# Patient Record
Sex: Male | Born: 1940 | Race: White | Hispanic: No | Marital: Married | Smoking: Former smoker
Health system: Southern US, Community
[De-identification: ages and names within clinical notes are randomized; demographics above are authoritative.]

## PROBLEM LIST (undated history)

## (undated) DIAGNOSIS — N429 Disorder of prostate, unspecified: Secondary | ICD-10-CM

## (undated) DIAGNOSIS — D696 Thrombocytopenia, unspecified: Secondary | ICD-10-CM

## (undated) DIAGNOSIS — T8859XA Other complications of anesthesia, initial encounter: Secondary | ICD-10-CM

## (undated) DIAGNOSIS — J849 Interstitial pulmonary disease, unspecified: Secondary | ICD-10-CM

## (undated) DIAGNOSIS — J189 Pneumonia, unspecified organism: Secondary | ICD-10-CM

## (undated) DIAGNOSIS — I714 Abdominal aortic aneurysm, without rupture, unspecified: Secondary | ICD-10-CM

## (undated) DIAGNOSIS — K219 Gastro-esophageal reflux disease without esophagitis: Secondary | ICD-10-CM

## (undated) DIAGNOSIS — E039 Hypothyroidism, unspecified: Secondary | ICD-10-CM

## (undated) DIAGNOSIS — I499 Cardiac arrhythmia, unspecified: Secondary | ICD-10-CM

## (undated) DIAGNOSIS — D649 Anemia, unspecified: Secondary | ICD-10-CM

## (undated) DIAGNOSIS — E079 Disorder of thyroid, unspecified: Secondary | ICD-10-CM

## (undated) DIAGNOSIS — C4490 Unspecified malignant neoplasm of skin, unspecified: Secondary | ICD-10-CM

## (undated) DIAGNOSIS — C61 Malignant neoplasm of prostate: Secondary | ICD-10-CM

## (undated) DIAGNOSIS — I6529 Occlusion and stenosis of unspecified carotid artery: Secondary | ICD-10-CM

## (undated) DIAGNOSIS — R972 Elevated prostate specific antigen [PSA]: Secondary | ICD-10-CM

## (undated) DIAGNOSIS — C969 Malignant neoplasm of lymphoid, hematopoietic and related tissue, unspecified: Secondary | ICD-10-CM

## (undated) DIAGNOSIS — G709 Myoneural disorder, unspecified: Secondary | ICD-10-CM

## (undated) DIAGNOSIS — E785 Hyperlipidemia, unspecified: Secondary | ICD-10-CM

## (undated) DIAGNOSIS — Z9481 Bone marrow transplant status: Secondary | ICD-10-CM

## (undated) DIAGNOSIS — M1990 Unspecified osteoarthritis, unspecified site: Secondary | ICD-10-CM

## (undated) DIAGNOSIS — D469 Myelodysplastic syndrome, unspecified: Secondary | ICD-10-CM

## (undated) DIAGNOSIS — I10 Essential (primary) hypertension: Secondary | ICD-10-CM

## (undated) DIAGNOSIS — K449 Diaphragmatic hernia without obstruction or gangrene: Secondary | ICD-10-CM

## (undated) DIAGNOSIS — T884XXA Failed or difficult intubation, initial encounter: Secondary | ICD-10-CM

## (undated) DIAGNOSIS — M1A9XX0 Chronic gout, unspecified, without tophus (tophi): Secondary | ICD-10-CM

## (undated) HISTORY — DX: Failed or difficult intubation, initial encounter: T88.4XXA

## (undated) HISTORY — DX: Thrombocytopenia, unspecified: D69.6

## (undated) HISTORY — DX: Hyperlipidemia, unspecified: E78.5

## (undated) HISTORY — DX: Gastro-esophageal reflux disease without esophagitis: K21.9

## (undated) HISTORY — PX: EGD: SHX3789

## (undated) HISTORY — DX: Other complications of anesthesia, initial encounter: T88.59XA

## (undated) HISTORY — DX: Diaphragmatic hernia without obstruction or gangrene: K44.9

## (undated) HISTORY — PX: CATARACT EXTRACTION: SUR2

## (undated) HISTORY — DX: Unspecified osteoarthritis, unspecified site: M19.90

## (undated) HISTORY — DX: Chronic gout, unspecified, without tophus (tophi): M1A.9XX0

## (undated) HISTORY — DX: Essential (primary) hypertension: I10

## (undated) HISTORY — DX: Myelodysplastic syndrome, unspecified: D46.9

## (undated) HISTORY — DX: Hypothyroidism, unspecified: E03.9

## (undated) HISTORY — DX: Anemia, unspecified: D64.9

## (undated) HISTORY — DX: Disorder of prostate, unspecified: N42.9

## (undated) HISTORY — PX: JOINT REPLACEMENT: SHX530

## (undated) HISTORY — PX: EYE SURGERY: SHX253

## (undated) HISTORY — PX: HERNIA REPAIR: SHX51

## (undated) HISTORY — PX: KNEE SURGERY: SHX244

## (undated) HISTORY — PX: CARDIAC CATHETERIZATION: SHX172

## (undated) HISTORY — PX: MOHS SURGERY: SUR867

## (undated) HISTORY — DX: Occlusion and stenosis of unspecified carotid artery: I65.29

## (undated) HISTORY — DX: Disorder of thyroid, unspecified: E07.9

## (undated) HISTORY — DX: Abdominal aortic aneurysm, without rupture, unspecified: I71.40

## (undated) HISTORY — PX: ABDOMINAL AORTIC ANEURYSM REPAIR: SUR1152

## (undated) HISTORY — PX: BONE MARROW TRANSPLANT: SHX200

---
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
## 1971-04-01 HISTORY — PX: FRACTURE SURGERY: SHX138

## 1986-03-31 HISTORY — PX: OTHER SURGICAL HISTORY: SHX169

## 1993-06-02 ENCOUNTER — Emergency Department: Admit: 1993-06-02 | Disposition: A | Discharge status: Home / Self Care | Payer: Self-pay | Source: Ambulatory Visit

## 2002-03-31 DIAGNOSIS — C329 Malignant neoplasm of larynx, unspecified: Secondary | ICD-10-CM

## 2002-03-31 DIAGNOSIS — C801 Malignant (primary) neoplasm, unspecified: Secondary | ICD-10-CM

## 2002-03-31 HISTORY — DX: Malignant (primary) neoplasm, unspecified: C80.1

## 2002-03-31 HISTORY — DX: Malignant neoplasm of larynx, unspecified: C32.9

## 2002-05-31 ENCOUNTER — Observation Stay
Admission: RE | Admit: 2002-05-31 | Disposition: A | Discharge status: Home / Self Care | Payer: Self-pay | Source: Ambulatory Visit | Admitting: Otolaryngology

## 2002-08-05 ENCOUNTER — Ambulatory Visit
Admit: 2002-08-05 | Disposition: A | Discharge status: Home / Self Care | Payer: Self-pay | Source: Ambulatory Visit | Admitting: Otolaryngology

## 2002-08-09 ENCOUNTER — Ambulatory Visit: Admission: RE | Admit: 2002-08-09 | Payer: Self-pay | Source: Ambulatory Visit | Admitting: Otolaryngology

## 2009-03-07 ENCOUNTER — Ambulatory Visit
Admission: AD | Admit: 2009-03-07 | Disposition: A | Discharge status: Home / Self Care | Payer: Self-pay | Source: Ambulatory Visit | Admitting: Gastroenterology

## 2009-03-31 HISTORY — PX: JOINT REPLACEMENT: SHX530

## 2009-06-22 ENCOUNTER — Ambulatory Visit
Admission: RE | Admit: 2009-06-22 | Disposition: A | Discharge status: Home / Self Care | Payer: Self-pay | Source: Ambulatory Visit | Admitting: Gastroenterology

## 2009-08-09 ENCOUNTER — Observation Stay
Admission: EM | Admit: 2009-08-09 | Disposition: A | Discharge status: Home / Self Care | Payer: Self-pay | Source: Emergency Department | Admitting: Internal Medicine

## 2009-08-11 LAB — CBC AND DIFFERENTIAL
BASOPHILS %: 0.3 % (ref 0.0–2.0)
Baso(Absolute): 0.02 10*3/uL (ref 0.00–0.20)
Eosinophils %: 1.7 % (ref 0.0–6.0)
Eosinophils Absolute: 0.1 10*3/uL (ref 0.10–0.30)
Hematocrit: 39.2 % (ref 39.0–49.0)
Hemoglobin: 12.9 g/dL — ABNORMAL LOW (ref 13.2–17.3)
Immature Granulocytes #: 0.02 10*3/uL (ref 0.00–0.05)
Immature Granulocytes %: 0.3 % — ABNORMAL HIGH (ref 0.0–0.0)
Lymphocytes Absolute: 1 10*3/uL (ref 1.00–4.80)
Lymphocytes Relative: 16.9 % — ABNORMAL LOW (ref 25.0–55.0)
MCH: 28.9 pg (ref 27.0–34.0)
MCHC: 32.9 g/dL (ref 32.0–36.0)
MCV: 87.7 fL (ref 80–100)
MPV: 9.4 fL (ref 9.0–13.0)
Monocytes Absolute: 0.42 10*3/uL (ref 0.10–1.20)
Monocytes Relative %: 7.1 % (ref 1.0–8.0)
Neutrophils Absolute: 4.37 10*3/uL (ref 1.80–7.70)
Neutrophils Relative %: 74 % — ABNORMAL HIGH (ref 49.0–69.0)
Nucleated RBC %: 0 /100WBC (ref 0.0–0.0)
Nucleted RBC #: 0 10*3/uL (ref 0.00–0.00)
Platelets: 215 10*3/uL (ref 150–400)
RBC: 4.47 M/uL (ref 3.80–5.40)
RDW: 14.4 % — ABNORMAL HIGH (ref 11.0–14.0)
WBC: 5.91 10*3/uL (ref 4.80–10.80)

## 2009-08-11 LAB — MAGNESIUM: Magnesium: 2.1 mg/dL (ref 1.7–2.2)

## 2009-08-11 LAB — CK
Creatine Kinase (CK): 75 U/L (ref 19–216)
Creatine Kinase (CK): 67 U/L (ref 19–216)

## 2009-08-11 LAB — COMPREHENSIVE METABOLIC PANEL
ALT: 37 U/L (ref 7–56)
AST (SGOT): 22 U/L (ref 5–40)
Albumin, Synovial: 4.5 g/dL (ref 3.9–5.0)
Alkaline Phosphatase: 102 U/L (ref 38–126)
BUN / Creatinine Ratio: 25 — ABNORMAL HIGH (ref 8–20)
BUN: 20 mg/dL (ref 6–20)
Bilirubin, Total: 0.4 mg/dL (ref 0.2–1.3)
CO2: 25 mmol/L (ref 21.0–31.0)
Calcium: 9 mg/dL (ref 8.4–10.2)
Chloride: 104 mmol/L (ref 101–111)
Creatinine: 0.83 mg/dL (ref 0.66–1.25)
EGFR: >60 mL/min/{1.73_m2}
EGFR: >60 mL/min/{1.73_m2}
Glucose: 100 mg/dL (ref 70–100)
Potassium: 4.2 mmol/L (ref 3.6–5.0)
Protein, Total: 7.2 g/dL (ref 6.3–8.2)
Sodium: 143 mmol/L (ref 135–145)

## 2009-08-11 LAB — URINALYSIS
Bilirubin, UA: NEGATIVE
Blood, UA: NEGATIVE
Glucose, UA: NEGATIVE
Ketones UA: NEGATIVE
Leukocyte Esterase, UA: NEGATIVE
Nitrate: NEGATIVE
Protein, UR: NEGATIVE
Specific Gravity, UR: 1.022 (ref 1.000–1.035)
Urobilinogen, UA: NORMAL
pH, Urine: 6 (ref 5.0–8.0)

## 2009-08-11 LAB — TROPONIN I
Troponin I: <0.012 ng/mL (ref 0.000–0.034)
Troponin I: <0.012 ng/mL (ref 0.000–0.034)

## 2009-08-11 LAB — PTT (PARTIAL THROMBOPLASTIN TIME)
PTT Ratio: 1.1 (ref 0.8–1.2)
PTT: 31.3 s (ref 21.6–34.0)

## 2009-08-11 LAB — PT (PROTHROMBIN TIME)
PT INR: 1.3
PT: 14 s — ABNORMAL HIGH (ref 10.6–12.8)

## 2009-08-11 LAB — D-DIMER, QUANTITATIVE: D-Dimer, Quant.: 1017 ng/mL

## 2009-08-11 LAB — MYOGLOBIN, SERUM: Myoglobin: 26 ng/mL (ref 0–121)

## 2009-08-11 LAB — B-TYPE NATRIURETIC PEPTIDE: B-Natriuretic Peptide: 34 pg/mL (ref 0–100)

## 2010-10-15 ENCOUNTER — Emergency Department
Admission: EM | Admit: 2010-10-15 | Disposition: A | Discharge status: Home / Self Care | Payer: Self-pay | Source: Emergency Department | Admitting: Emergency Medical Services

## 2010-10-15 LAB — CBC AND DIFFERENTIAL
BASOPHILS %: 0.2 % (ref 0.0–2.0)
Baso(Absolute): 0.02 10*3/uL (ref 0.00–0.20)
Eosinophils %: 0.2 % (ref 0.0–6.0)
Eosinophils Absolute: 0.02 10*3/uL — ABNORMAL LOW (ref 0.10–0.30)
Hematocrit: 39.9 % (ref 39.0–49.0)
Hemoglobin: 13.8 g/dL (ref 13.2–17.3)
Immature Granulocytes #: 0.02 10*3/uL (ref 0.00–0.05)
Immature Granulocytes %: 0.2 % — ABNORMAL HIGH (ref 0.0–0.0)
Lymphocytes Absolute: 0.4 10*3/uL — ABNORMAL LOW (ref 1.00–4.80)
Lymphocytes Relative: 3.9 % — ABNORMAL LOW (ref 25.0–55.0)
MCH: 31.2 pg (ref 27.0–34.0)
MCHC: 34.6 g/dL (ref 32.0–36.0)
MCV: 90.3 fL (ref 80–100)
MPV: 9.6 fL (ref 9.0–13.0)
Monocytes Absolute: 0.24 10*3/uL (ref 0.10–1.20)
Monocytes Relative %: 2.4 % (ref 1.0–8.0)
Neutrophils Absolute: 9.48 10*3/uL — ABNORMAL HIGH (ref 1.80–7.70)
Neutrophils Relative %: 93.3 % — ABNORMAL HIGH (ref 49.0–69.0)
Nucleated RBC %: 0 /100WBC (ref 0.0–0.0)
Nucleted RBC #: 0 10*3/uL (ref 0.00–0.00)
Platelets: 180 10*3/uL (ref 150–400)
RBC: 4.42 M/uL (ref 3.80–5.40)
RDW: 14 % (ref 11.0–14.0)
WBC: 10.16 10*3/uL (ref 4.80–10.80)

## 2010-10-15 LAB — COMPREHENSIVE METABOLIC PANEL
ALT: 33 U/L (ref 7–56)
AST (SGOT): 34 U/L (ref 5–40)
Albumin, Synovial: 4.7 g/dL (ref 3.9–5.0)
Alkaline Phosphatase: 83 U/L (ref 38–126)
BUN / Creatinine Ratio: 16 (ref 8–20)
BUN: 18 mg/dL (ref 6–20)
Bilirubin, Total: 1.5 mg/dL — ABNORMAL HIGH (ref 0.2–1.3)
CO2: 25 mmol/L (ref 21.0–31.0)
Calcium: 9.2 mg/dL (ref 8.4–10.2)
Chloride: 100 mmol/L — ABNORMAL LOW (ref 101–111)
Creatinine: 1.14 mg/dL (ref 0.66–1.25)
EGFR: >60 mL/min/{1.73_m2}
EGFR: >60 mL/min/{1.73_m2}
Glucose: 92 mg/dL (ref 70–100)
Potassium: 4.1 mmol/L (ref 3.6–5.0)
Protein, Total: 7.9 g/dL (ref 6.3–8.2)
Sodium: 139 mmol/L (ref 135–145)

## 2010-10-15 LAB — LACTIC ACID, PLASMA: Lactic acid: 1.8 mmol/L (ref 0.7–2.1)

## 2010-10-15 LAB — URINALYSIS
Bilirubin, UA: NEGATIVE
Blood, UA: NEGATIVE
Glucose, UA: NEGATIVE
Ketones UA: NEGATIVE
Leukocyte Esterase, UA: NEGATIVE
Nitrate: NEGATIVE
Protein, UR: NEGATIVE
Specific Gravity, UR: 1.015 (ref 1.000–1.035)
Urobilinogen, UA: NORMAL
pH, Urine: 6 (ref 5.0–8.0)

## 2010-10-15 LAB — PTT (PARTIAL THROMBOPLASTIN TIME): PTT: 33.1 s (ref 23.0–37.0)

## 2010-10-15 LAB — CK: Creatine Kinase (CK): 100 U/L (ref 19–216)

## 2010-10-15 LAB — INFLUENZA ANTIGEN
Influenza A: NEGATIVE
Influenza B: NEGATIVE

## 2010-10-15 LAB — MONONUCLEOSIS SCREEN: Mono Screen: NEGATIVE

## 2010-10-15 LAB — SEDIMENTATION RATE, AUTOMATED: Sed Rate: 32 — ABNORMAL HIGH (ref 0–20)

## 2010-10-15 LAB — C-REACTIVE PROTEIN, QUANTITATIVE: C-Reactive Protein, Quantitative: 1.3 mg/dL — ABNORMAL HIGH (ref 0.0–1.0)

## 2010-10-15 LAB — PT (PROTHROMBIN TIME)
PT INR: 1
PT: 13.6 s (ref 12.6–15.0)

## 2010-10-15 LAB — STREP A ANTIGEN (THROAT): Streptococcus A AG: NEGATIVE

## 2011-01-01 NOTE — Consults (Signed)
Brent [name], Brent [name]                                                    MRN:          [id number]                                                          Account:      [id number]                                                     Document ID:  [id number]                                               Service Date: 08/09/2009                                                                                    MRN: [id number]  Document ID: [id number]  Admit Date: 08/09/2009     Patient Location: [id number]   Patient Type: O     CONSULTING PHYSICIAN: Chevis Pretty MD     REFERRING PHYSICIAN:         HISTORY OF PRESENT ILLNESS:  This is a 70 year old gentleman with past medical history of  hyperlipidemia, borderline hypertension, throat cancer in the past who now  comes in with complaints of dizziness.  According to the patient, he played  golf yesterday had a few drinks last night and when he woke up this  morning, he had a sense of dizziness.  This dizziness is described as a  spinning sensation as if the room was spinning.  This sensation came every  time he bent down.  It seems to be positional.  The patient had never  experienced this kind of dizziness in the past.  He came to the emergency  room.  An emergent CT scan of the head was done, which was unremarkable.   The patient had another episode in the emergency room where he again felt  dizzy.  The patient denies any weakness anywhere on the body.  He denies  any speech or swallowing problems.  He denies any imbalance.  His primary  care physician has been monitoring him for blood pressure management.  His  blood pressures have been borderline and running slightly high at times.     PAST MEDICAL HISTORY:  As mentioned above is significant for hyperlipidemia, borderline  hypertension, history of throat cancer in the past.     SOCIAL HISTORY:  The patient quit smoking several  years ago.  He takes alcohol occasionally.     FAMILY HISTORY:  Noncontributory.      ALLERGIES:  No known drug allergies.     CURRENT MEDICATIONS:  Celebrex, Lipitor, Protonix.     REVIEW OF SYSTEMS:                                                                                                           Page 1 of 2  Brent [name]                                                    MRN:          [id number]                                                          Account:      [id number]                                                     Document ID:  [id number]                                               Service Date: 08/09/2009                                                                                    I reviewed history and physical.     PHYSICAL EXAMINATION:  VITAL SIGNS:  Noted in nurse's notes.  HEENT:  Atraumatic, normocephalic.  Eyes:  Extraocular muscles are intact.   Tongue midline.  NECK:  Supple.  NEUROLOGIC:  Higher motor functions:  Awake, alert, oriented, answers  questions appropriately.  Cranial nerves grossly intact.  Motor:  Good  functional strength in all 4 extremities.  Reflexes bilaterally  symmetrical.  Sensory intact.  Cerebellar functions okay on finger-to-nose  testing.  Gait is steady.     DIAGNOSTIC TESTS:  CT of the head does not reveal any acute intracranial events.     ASSESSMENT AND PLAN:  This is a 70 year old gentleman with past medical history of  hyperlipidemia, hypertension,  borderline who has been admitted with  complaints of dizziness, possibly benign positional vertigo.  He is now  going for an MRI of the brain.  I agree with continuing aspirin in this  gentleman.  He is getting meclizine to help with his symptoms of dizziness.   I would also like to get a carotid Doppler study done prior to his  discharge.     Thanks for the opportunity to participate in the care of this patient.  I  will be following this patient with you.           Electronic Signing Provider  _______________________________     Date/Time Signed: _____________  Chevis Pretty MD [phone])     D:  08/09/2009 22:43 PM by Dr. Chevis Pretty, MD [phone])  T:  08/10/2009 09:43 AM by Crissie Sickles      Everlean Cherry: [id number]) (Doc ID: [id number])        cc: Juliene Pina LONG MD                                                                                                           Page 2 of 2  Authenticated by Chevis Pretty, M.D. On 08/11/2009 02:52:12 PM

## 2011-01-01 NOTE — H&P (Signed)
Brent [name], Brent [name]                                                    MRN:          [id number]                                                          Account:      [id number]                                                     Document ID:  [id number]                                                                                                                                   MRN: [id number]  Document ID: [id number]  Admit Date: 08/09/2009     Patient Location: [id number]   Patient Type: O     ATTENDING PHYSICIAN: Michaelle Copas, MD        CHIEF COMPLAINT:  Dizziness, vertigo.     HISTORY OF PRESENT ILLNESS:  This is a 70 year old male with a history of, hyperlipidemia, hypertension,  history of peptic ulcer disease, as well as throat cancer with radiation  years ago.  According to him, he went out with his friends last night after  playing golf and had almost 4 drinks.  This morning when he woke up, he  complained of dizziness.  According to him, when he bending over he started  feeling he was going to fall down with some spinning sensation.  It  happened a couple of times; then he came to ER.  He is denying any other  headache, any eye or ear pain, ear discharge, any sore throat, any other  focal numbness or weakness.  He just felt generally weak and tired and was  seen in the ER.  Initially, he was observed in the ER.  He had a CT of the  head which was unremarkable and a CTA of the chest was negative for any  DVT.  After hydration, it was planned for him to go home, but apparently  went to the bathroom and according to him he was going to pass out again.   All of those episodes happened when he tried to bend over.  Also, according  to him, this morning it was happening when he was moving towards his left  side.  He is  going to be admitted for further evaluation and treatment.     PAST MEDICAL HISTORY:  As described above.     PAST SURGICAL HISTORY:  Remarkable orthopedic left knee surgery.     SOCIAL  HISTORY:  Denies alcohol use or tobacco use.  He only consumes alcohol socially.     FAMILY HISTORY:  He denied early CVA.     ALLERGIES:  No known drug allergies.                                                                                                           Page 1 of 3  Brent [name], Brent [name]                                                    MRN:          [id number]                                                          Account:      [id number]                                                     Document ID:  [id number]                                                                                                                                      MEDICATIONS:  Lipitor, Protonix, Celebrex, adult aspirin.     REVIEW OF SYSTEMS:  He is denying any headache, eye or ear problem, sore throat, sinus issues;  no chest pain, no palpitation, no shortness of breath or cough.  No  abdominal pain, nausea, or vomiting.  GENITOURINARY:  No dysuria, frequency, urgency.  MUSCULOSKELETAL:  No back pain or fall or injury.  SKIN:  No rash or bruises.  HEMATOLOGICAL:  No bleeding disorder or blood coagulation problem.  ENDOCRINE:  No polyuria or polydipsia.  All other systems reviewed were  unremarkable, negative.     PHYSICAL EXAMINATION:  GENERAL:  The patient is lying in bed.  He is quite comfortable, in no  gross distress.  He is alert and oriented x3.  VITAL SIGNS:  Afebrile, pulse 70, respirations 16, and BP 147/80,  saturations are 98% room air.  HEENT:  Pupils equal, round, and reactive to light.  Sclerae and  conjunctivae look normal.  No scalp tenderness.  No nystagmus noted.  NECK:  Supple.  CHEST:  Good breath sounds.  Bilaterally symmetrical.  No rhonchi, crackles  or tracheal deviation.  CARDIOVASCULAR:  Regular rate and rhythm.  No murmur or JVD.  ABDOMEN:  Soft, nontender, nondistended.  Bowel sounds positive.  EXTREMITIES:  No ankle edema.  Distal pulses positive.  NEUROLOGIC:  He is alert and oriented  x3.  No gross focal weakness or  numbness.  No sign of cranial nerve deficits.  SKIN:  No skin rash, petechiae, purple spot, or bruises.  No cervical or  axillary lymphadenopathy.  PSYCHIATRIC:  Pleasant, no flat affect or anxious look.     LABORATORY DATA:  As described above.  Cardiac enzymes negative.  UA is unremarkable.  Chest  x-ray is negative.  CBC is unremarkable for slightly decreased H and H of  12.9; INR is 1.3.  Chemistries are unremarkable.  The D-dimer was 1017.   BNP was 34.     ASSESSMENT AND PLAN:  1.  Dizziness and slight vertigo.  Clinically, it seems most likely  vertigo. He is going to be observed overnight for persistent symptoms.  I  will get orthostatics to rule out underlying ulcerative component.  I will  treat with supportive care including IV fluid along with meclizine and                                                                                                           Page 2 of 3  Brent [name], Brent [name]                                                    MRN:          [id number]                                                          Account:      [id number]                                                     Document ID:  [id number] 11 [id number]  consult neurology.  With his age, I think he will need MRI to rule out  underlying etiology.  2.  History of peptic ulcer disease, on Protonix and will continue that.  3.  History of knee pain in the past, status post surgery and on Celebrex  for his musculoskeletal pains.  I will continue this for the time being.  I  will ask him to see a PCP to review whether he needs to be on Celebrex with  high-dose twice daily.              D:  08/09/2009 17:05 PM by Alisa Graff, MD [phone])  T:  08/09/2009 21:59 PM by Marylene Buerger      Everlean Cherry: [id number]) (Doc ID: [id number])        cc: Juliene Pina LONG MD                                                                                                            Page 3 of 3  Authenticated by Alisa Graff, MD On 08/10/2009 11:13:23 AM

## 2011-04-01 DIAGNOSIS — J849 Interstitial pulmonary disease, unspecified: Secondary | ICD-10-CM

## 2011-04-01 DIAGNOSIS — I2699 Other pulmonary embolism without acute cor pulmonale: Secondary | ICD-10-CM

## 2011-04-01 HISTORY — DX: Interstitial pulmonary disease, unspecified: J84.9

## 2011-04-01 HISTORY — DX: Other pulmonary embolism without acute cor pulmonale: I26.99

## 2011-05-30 HISTORY — PX: THORACOSCOPY, (VATS): SHX5576

## 2011-06-17 ENCOUNTER — Ambulatory Visit: Payer: Medicare Other

## 2011-06-17 DIAGNOSIS — Z01818 Encounter for other preprocedural examination: Secondary | ICD-10-CM | POA: Insufficient documentation

## 2011-06-17 DIAGNOSIS — J84113 Idiopathic non-specific interstitial pneumonitis: Secondary | ICD-10-CM

## 2011-06-17 LAB — PT/INR
PT INR: 1.2 — ABNORMAL HIGH (ref 0.9–1.1)
PT: 14.7 s (ref 12.6–15.0)

## 2011-06-17 LAB — CBC
Hematocrit: 39.2 % — ABNORMAL LOW (ref 42.0–52.0)
Hgb: 13.2 g/dL (ref 13.0–17.0)
MCH: 30.6 pg (ref 28.0–32.0)
MCHC: 33.7 g/dL (ref 32.0–36.0)
MCV: 91 fL (ref 80.0–100.0)
MPV: 9.2 fL — ABNORMAL LOW (ref 9.4–12.3)
Nucleated RBC: 0 /100 WBC
Platelets: 253 10*3/uL (ref 140–400)
RBC: 4.31 10*6/uL — ABNORMAL LOW (ref 4.70–6.00)
RDW: 15 % (ref 12–15)
WBC: 6.15 10*3/uL (ref 3.50–10.80)

## 2011-06-17 LAB — COMPREHENSIVE METABOLIC PANEL
ALT: 35 U/L (ref 0–55)
AST (SGOT): 29 U/L (ref 5–34)
Albumin/Globulin Ratio: 1.2 (ref 0.9–2.2)
Albumin: 3.8 g/dL (ref 3.5–5.0)
Alkaline Phosphatase: 61 U/L (ref 40–150)
BUN: 21 mg/dL — ABNORMAL HIGH (ref 8.0–20.0)
Bilirubin, Total: 0.7 mg/dL (ref 0.1–1.2)
CO2: 26 mEq/L (ref 21–30)
Calcium: 9.4 mg/dL (ref 8.5–10.5)
Chloride: 104 mEq/L (ref 96–109)
Creatinine: 1 mg/dL (ref 0.5–1.5)
Globulin: 3.2 g/dL (ref 2.0–3.7)
Glucose: 66 mg/dL — ABNORMAL LOW (ref 70–100)
Potassium: 3.9 mEq/L (ref 3.5–5.3)
Protein, Total: 7 g/dL (ref 6.0–8.3)
Sodium: 141 mEq/L (ref 135–146)

## 2011-06-17 LAB — GFR: EGFR: >60

## 2011-06-17 LAB — TYPE AND SCREEN
AB Screen Gel: NEGATIVE
ABO Rh: AB POS

## 2011-06-17 LAB — HEMOLYSIS INDEX: Hemolysis Index: 2 Index (ref 0–9)

## 2011-06-18 ENCOUNTER — Ambulatory Visit: Payer: Medicare Other | Attending: Thoracic Surgery (Cardiothoracic Vascular Surgery)

## 2011-06-18 LAB — ECG 12-LEAD
Atrial Rate: 70 {beats}/min
P Axis: 51 degrees
P-R Interval: 158 ms
Q-T Interval: 420 ms
QRS Duration: 90 ms
QTC Calculation (Bezet): 453 ms
R Axis: 43 degrees
T Axis: 66 degrees
Ventricular Rate: 70 {beats}/min

## 2011-06-18 NOTE — Pre-Procedure Instructions (Signed)
Requesting record of pulmonary evaluation from Dr Loyal Gambler to be faxed to [phone].

## 2011-06-18 NOTE — Pre-Procedure Instructions (Signed)
Rev/faxed/EKG done 3/20

## 2011-06-18 NOTE — Pre-Procedure Instructions (Signed)
Called Pulm. They will fax LOV to [phone]

## 2011-06-18 NOTE — Pre-Procedure Instructions (Addendum)
Rev/faxed/Lab/EKG pending done 03/19

## 2011-06-18 NOTE — Pre-Procedure Instructions (Signed)
Spoke w/[facility] in Dr Einar Grad office she is faxing packet today to [phone]

## 2011-06-18 NOTE — Pre-Procedure Instructions (Signed)
Pulm note CT CXR rev in [facility]

## 2011-06-19 ENCOUNTER — Inpatient Hospital Stay
Admission: RE | Admit: 2011-06-19 | Discharge: 2011-06-23 | DRG: 163 | Disposition: A | Discharge status: Home / Self Care | Payer: Medicare Other | Source: Ambulatory Visit | Attending: Thoracic Surgery (Cardiothoracic Vascular Surgery) | Admitting: Thoracic Surgery (Cardiothoracic Vascular Surgery)

## 2011-06-19 ENCOUNTER — Encounter
Admission: RE | Disposition: A | Discharge status: Home / Self Care | Payer: Self-pay | Source: Ambulatory Visit | Attending: Thoracic Surgery (Cardiothoracic Vascular Surgery)

## 2011-06-19 ENCOUNTER — Ambulatory Visit: Payer: Self-pay

## 2011-06-19 ENCOUNTER — Encounter: Payer: Self-pay | Admitting: Anesthesiology

## 2011-06-19 ENCOUNTER — Inpatient Hospital Stay: Payer: Medicare Other

## 2011-06-19 ENCOUNTER — Ambulatory Visit: Payer: Medicare Other | Admitting: Anesthesiology

## 2011-06-19 ENCOUNTER — Inpatient Hospital Stay: Payer: Medicare Other | Admitting: Thoracic Surgery (Cardiothoracic Vascular Surgery)

## 2011-06-19 DIAGNOSIS — J810 Acute pulmonary edema: Secondary | ICD-10-CM | POA: Diagnosis not present

## 2011-06-19 DIAGNOSIS — E8779 Other fluid overload: Secondary | ICD-10-CM | POA: Diagnosis not present

## 2011-06-19 DIAGNOSIS — J849 Interstitial pulmonary disease, unspecified: Secondary | ICD-10-CM

## 2011-06-19 DIAGNOSIS — Z9981 Dependence on supplemental oxygen: Secondary | ICD-10-CM

## 2011-06-19 DIAGNOSIS — J9601 Acute respiratory failure with hypoxia: Secondary | ICD-10-CM

## 2011-06-19 DIAGNOSIS — R52 Pain, unspecified: Secondary | ICD-10-CM

## 2011-06-19 DIAGNOSIS — K219 Gastro-esophageal reflux disease without esophagitis: Secondary | ICD-10-CM | POA: Diagnosis present

## 2011-06-19 DIAGNOSIS — Z8521 Personal history of malignant neoplasm of larynx: Secondary | ICD-10-CM

## 2011-06-19 DIAGNOSIS — J84113 Idiopathic non-specific interstitial pneumonitis: Principal | ICD-10-CM | POA: Diagnosis present

## 2011-06-19 DIAGNOSIS — Z923 Personal history of irradiation: Secondary | ICD-10-CM

## 2011-06-19 DIAGNOSIS — Z86718 Personal history of other venous thrombosis and embolism: Secondary | ICD-10-CM

## 2011-06-19 DIAGNOSIS — Z86711 Personal history of pulmonary embolism: Secondary | ICD-10-CM

## 2011-06-19 DIAGNOSIS — E8770 Fluid overload, unspecified: Secondary | ICD-10-CM

## 2011-06-19 DIAGNOSIS — J988 Other specified respiratory disorders: Secondary | ICD-10-CM | POA: Diagnosis not present

## 2011-06-19 DIAGNOSIS — Z96659 Presence of unspecified artificial knee joint: Secondary | ICD-10-CM

## 2011-06-19 DIAGNOSIS — E785 Hyperlipidemia, unspecified: Secondary | ICD-10-CM | POA: Diagnosis present

## 2011-06-19 DIAGNOSIS — R0600 Dyspnea, unspecified: Secondary | ICD-10-CM | POA: Diagnosis present

## 2011-06-19 DIAGNOSIS — IMO0002 Reserved for concepts with insufficient information to code with codable children: Secondary | ICD-10-CM | POA: Diagnosis not present

## 2011-06-19 DIAGNOSIS — Z7901 Long term (current) use of anticoagulants: Secondary | ICD-10-CM

## 2011-06-19 DIAGNOSIS — J9819 Other pulmonary collapse: Secondary | ICD-10-CM | POA: Diagnosis not present

## 2011-06-19 DIAGNOSIS — I10 Essential (primary) hypertension: Secondary | ICD-10-CM | POA: Diagnosis present

## 2011-06-19 DIAGNOSIS — G8918 Other acute postprocedural pain: Secondary | ICD-10-CM | POA: Diagnosis not present

## 2011-06-19 DIAGNOSIS — J9620 Acute and chronic respiratory failure, unspecified whether with hypoxia or hypercapnia: Secondary | ICD-10-CM | POA: Diagnosis present

## 2011-06-19 SURGERY — THORACOSCOPY, (VATS), LUNG BIOPSY
Anesthesia: Anesthesia General | Laterality: Right | Wound class: Clean

## 2011-06-19 MED ORDER — HYDROMORPHONE HCL 2 MG PO TABS
ORAL_TABLET | ORAL | Status: AC
Start: 2011-06-19 — End: 2011-06-19
  Administered 2011-06-19: 4 mg
  Filled 2011-06-19: qty 2

## 2011-06-19 MED ORDER — NALBUPHINE HCL 10 MG/ML IJ SOLN
2.50 mg | INTRAMUSCULAR | Status: DC | PRN
Start: 2011-06-19 — End: 2011-06-22
  Filled 2011-06-19 (×6): qty 1

## 2011-06-19 MED ORDER — HYDROMORPHONE HCL 4 MG PO TABS
4.00 mg | ORAL_TABLET | ORAL | Status: DC | PRN
Start: 2011-06-19 — End: 2011-06-19

## 2011-06-19 MED ORDER — DIPHENHYDRAMINE HCL 50 MG/ML IJ SOLN
6.25 mg | Freq: Four times a day (QID) | INTRAMUSCULAR | Status: DC | PRN
Start: 2011-06-19 — End: 2011-06-22
  Administered 2011-06-21: 6.25 mg via INTRAVENOUS
  Filled 2011-06-19: qty 1

## 2011-06-19 MED ORDER — HYDROMORPHONE HCL PF 1 MG/ML IJ SOLN
INTRAMUSCULAR | Status: AC
Start: 2011-06-19 — End: 2011-06-19
  Administered 2011-06-19: 0.5 mg via INTRAVENOUS
  Filled 2011-06-19: qty 1

## 2011-06-19 MED ORDER — ACETAMINOPHEN 500 MG PO TABS
1000.00 mg | ORAL_TABLET | Freq: Four times a day (QID) | ORAL | Status: AC
Start: 2011-06-19 — End: 2011-06-22
  Administered 2011-06-20 – 2011-06-22 (×8): 1000 mg via ORAL
  Filled 2011-06-19 (×6): qty 2

## 2011-06-19 MED ORDER — LIDOCAINE HCL 2 % IJ SOLN
INTRAMUSCULAR | Status: DC | PRN
Start: 2011-06-19 — End: 2011-06-19
  Administered 2011-06-19: 100 mg

## 2011-06-19 MED ORDER — MEPERIDINE HCL 25 MG/ML IJ SOLN
25.0000 mg | INTRAMUSCULAR | Status: DC | PRN
Start: 2011-06-19 — End: 2011-06-19

## 2011-06-19 MED ORDER — PANTOPRAZOLE SODIUM 40 MG PO TBEC
40.00 mg | DELAYED_RELEASE_TABLET | Freq: Every day | ORAL | Status: DC
Start: 2011-06-19 — End: 2011-06-20
  Administered 2011-06-19 – 2011-06-20 (×2): 40 mg via ORAL
  Filled 2011-06-19 (×2): qty 1

## 2011-06-19 MED ORDER — HYDROMORPHONE HCL 2 MG PO TABS
2.00 mg | ORAL_TABLET | ORAL | Status: DC | PRN
Start: 2011-06-19 — End: 2011-06-19

## 2011-06-19 MED ORDER — PROMETHAZINE HCL 25 MG/ML IJ SOLN
6.2500 mg | Freq: Once | INTRAMUSCULAR | Status: DC | PRN
Start: 2011-06-19 — End: 2011-06-19

## 2011-06-19 MED ORDER — PROPOFOL 10 MG/ML IV EMUL
INTRAVENOUS | Status: DC | PRN
Start: 2011-06-19 — End: 2011-06-19
  Administered 2011-06-19: 70 mg via INTRAVENOUS
  Administered 2011-06-19: 50 mg via INTRAVENOUS
  Administered 2011-06-19: 80 mg via INTRAVENOUS

## 2011-06-19 MED ORDER — ALBUTEROL SULFATE (2.5 MG/3ML) 0.083% IN NEBU
INHALATION_SOLUTION | RESPIRATORY_TRACT | Status: AC
Start: 2011-06-19 — End: 2011-06-19
  Filled 2011-06-19: qty 3

## 2011-06-19 MED ORDER — FENTANYL CITRATE 0.05 MG/ML IJ SOLN
25.0000 ug | INTRAMUSCULAR | Status: AC | PRN
Start: 2011-06-19 — End: 2011-06-19
  Administered 2011-06-19 (×3): 25 ug via INTRAVENOUS

## 2011-06-19 MED ORDER — FENTANYL CITRATE 0.05 MG/ML IJ SOLN
INTRAMUSCULAR | Status: AC
Start: 2011-06-19 — End: 2011-06-19
  Administered 2011-06-19: 25 ug via INTRAVENOUS
  Filled 2011-06-19: qty 2

## 2011-06-19 MED ORDER — ONDANSETRON HCL 4 MG/2ML IJ SOLN
4.0000 mg | Freq: Once | INTRAMUSCULAR | Status: DC | PRN
Start: 2011-06-19 — End: 2011-06-19

## 2011-06-19 MED ORDER — HYDROMORPHONE HCL PF 1 MG/ML IJ SOLN
0.60 mg | Freq: Once | INTRAMUSCULAR | Status: AC
Start: 2011-06-20 — End: 2011-06-20
  Administered 2011-06-20: 0.6 mg via INTRAVENOUS

## 2011-06-19 MED ORDER — ALBUTEROL SULFATE (2.5 MG/3ML) 0.083% IN NEBU
2.50 mg | INHALATION_SOLUTION | Freq: Four times a day (QID) | RESPIRATORY_TRACT | Status: DC | PRN
Start: 2011-06-19 — End: 2011-06-20
  Administered 2011-06-20: 2.5 mg via RESPIRATORY_TRACT
  Filled 2011-06-19: qty 3

## 2011-06-19 MED ORDER — SUCCINYLCHOLINE CHLORIDE 20 MG/ML IJ SOLN
INTRAMUSCULAR | Status: DC | PRN
Start: 2011-06-19 — End: 2011-06-19
  Administered 2011-06-19: 160 mg via INTRAVENOUS

## 2011-06-19 MED ORDER — PHENYLEPHRINE 100 MCG/ML IV BOLUS (ANESTHESIA)
PREFILLED_SYRINGE | INTRAVENOUS | Status: DC | PRN
Start: 2011-06-19 — End: 2011-06-19
  Administered 2011-06-19: 100 ug via INTRAVENOUS
  Administered 2011-06-19: 200 ug via INTRAVENOUS
  Administered 2011-06-19 (×2): 300 ug via INTRAVENOUS
  Administered 2011-06-19: 200 ug via INTRAVENOUS
  Administered 2011-06-19: 100 ug via INTRAVENOUS
  Administered 2011-06-19: 200 ug via INTRAVENOUS
  Administered 2011-06-19: 300 ug via INTRAVENOUS
  Administered 2011-06-19: 200 ug via INTRAVENOUS
  Administered 2011-06-19: 100 ug via INTRAVENOUS

## 2011-06-19 MED ORDER — ATORVASTATIN CALCIUM 10 MG PO TABS
40.00 mg | ORAL_TABLET | Freq: Every day | ORAL | Status: DC
Start: 2011-06-19 — End: 2011-06-23
  Administered 2011-06-19 – 2011-06-23 (×5): 40 mg via ORAL
  Filled 2011-06-19: qty 1
  Filled 2011-06-19 (×4): qty 4
  Filled 2011-06-19: qty 3

## 2011-06-19 MED ORDER — STERILE WATER FOR IRRIGATION IR SOLN
Status: DC | PRN
Start: 2011-06-19 — End: 2011-06-19
  Administered 2011-06-19: 800 mL

## 2011-06-19 MED ORDER — ALBUTEROL SULFATE HFA 108 (90 BASE) MCG/ACT IN AERS
2.0000 | INHALATION_SPRAY | Freq: Once | RESPIRATORY_TRACT | Status: DC
Start: 2011-06-19 — End: 2011-06-19
  Filled 2011-06-19: qty 1

## 2011-06-19 MED ORDER — ROCURONIUM BROMIDE 50 MG/5ML IV SOLN
INTRAVENOUS | Status: DC | PRN
Start: 2011-06-19 — End: 2011-06-19
  Administered 2011-06-19: 10 mg via INTRAVENOUS
  Administered 2011-06-19: 40 mg via INTRAVENOUS
  Administered 2011-06-19: 5 mg via INTRAVENOUS

## 2011-06-19 MED ORDER — DOCUSATE SODIUM 100 MG PO CAPS
100.00 mg | ORAL_CAPSULE | Freq: Two times a day (BID) | ORAL | Status: DC
Start: 2011-06-19 — End: 2011-06-23
  Administered 2011-06-19 – 2011-06-22 (×7): 100 mg via ORAL
  Filled 2011-06-19 (×8): qty 1

## 2011-06-19 MED ORDER — ONDANSETRON HCL 4 MG/2ML IJ SOLN
4.00 mg | INTRAMUSCULAR | Status: DC | PRN
Start: 2011-06-19 — End: 2011-06-23

## 2011-06-19 MED ORDER — HYDROMORPHONE PCA 0.2 MG/ML 100 ML (OUTSOURCED)
INTRAVENOUS | Status: DC
Start: 2011-06-19 — End: 2011-06-20
  Administered 2011-06-19: 20 mg via INTRAVENOUS
  Filled 2011-06-19: qty 100

## 2011-06-19 MED ORDER — LACTATED RINGERS IV SOLN
INTRAVENOUS | Status: DC
Start: 2011-06-19 — End: 2011-06-19

## 2011-06-19 MED ORDER — OXYCODONE-ACETAMINOPHEN 5-325 MG PO TABS
ORAL_TABLET | ORAL | Status: AC
Start: 2011-06-19 — End: 2011-06-19
  Administered 2011-06-19: 1 via ORAL
  Filled 2011-06-19: qty 1

## 2011-06-19 MED ORDER — FENTANYL CITRATE 0.05 MG/ML IJ SOLN
INTRAMUSCULAR | Status: DC | PRN
Start: 2011-06-19 — End: 2011-06-19
  Administered 2011-06-19 (×2): 50 ug via INTRAVENOUS
  Administered 2011-06-19 (×2): 100 ug via INTRAVENOUS

## 2011-06-19 MED ORDER — HEPARIN SODIUM (PORCINE) 5000 UNIT/ML IJ SOLN
5000.0000 [IU] | Freq: Three times a day (TID) | INTRAMUSCULAR | Status: DC
Start: 2011-06-19 — End: 2011-06-19
  Administered 2011-06-19: 5000 [IU] via SUBCUTANEOUS

## 2011-06-19 MED ORDER — LISINOPRIL 10 MG PO TABS
10.00 mg | ORAL_TABLET | Freq: Every day | ORAL | Status: DC
Start: 2011-06-19 — End: 2011-06-23
  Administered 2011-06-21 – 2011-06-22 (×2): 10 mg via ORAL
  Filled 2011-06-19 (×4): qty 1

## 2011-06-19 MED ORDER — GLYCOPYRROLATE 0.2 MG/ML IJ SOLN
INTRAMUSCULAR | Status: DC | PRN
Start: 2011-06-19 — End: 2011-06-19
  Administered 2011-06-19: .7 mg via INTRAVENOUS
  Administered 2011-06-19: 0.2 mg via INTRAVENOUS

## 2011-06-19 MED ORDER — PROMETHAZINE HCL 25 MG/ML IJ SOLN
6.25 mg | INTRAMUSCULAR | Status: DC | PRN
Start: 2011-06-19 — End: 2011-06-22

## 2011-06-19 MED ORDER — NEOSTIGMINE METHYLSULFATE 1 MG/ML IJ SOLN
INTRAMUSCULAR | Status: DC | PRN
Start: 2011-06-19 — End: 2011-06-19
  Administered 2011-06-19: 4 mg via INTRAVENOUS

## 2011-06-19 MED ORDER — OXYCODONE-ACETAMINOPHEN 5-325 MG PO TABS
2.00 | ORAL_TABLET | ORAL | Status: DC | PRN
Start: 2011-06-19 — End: 2011-06-20

## 2011-06-19 MED ORDER — OXYCODONE-ACETAMINOPHEN 5-325 MG PO TABS
1.00 | ORAL_TABLET | ORAL | Status: DC | PRN
Start: 2011-06-19 — End: 2011-06-19

## 2011-06-19 MED ORDER — NALOXONE HCL 0.4 MG/ML IJ SOLN
0.10 mg | INTRAMUSCULAR | Status: DC | PRN
Start: 2011-06-19 — End: 2011-06-22

## 2011-06-19 MED ORDER — OXYCODONE-ACETAMINOPHEN 5-325 MG PO TABS
1.00 | ORAL_TABLET | ORAL | Status: DC | PRN
Start: 2011-06-19 — End: 2011-06-20

## 2011-06-19 MED ORDER — BISACODYL 10 MG RE SUPP
10.00 mg | Freq: Every day | RECTAL | Status: DC | PRN
Start: 2011-06-19 — End: 2011-06-23
  Filled 2011-06-19: qty 1

## 2011-06-19 MED ORDER — CEFAZOLIN IVPB 2G/100ML NS -OUTSOURCED
2.0000 g | Freq: Once | INTRAVENOUS | Status: AC
Start: 2011-06-19 — End: 2011-06-19
  Administered 2011-06-19: 2 g via INTRAVENOUS

## 2011-06-19 MED ORDER — ACETAMINOPHEN 500 MG PO TABS
ORAL_TABLET | ORAL | Status: AC
Start: 2011-06-19 — End: 2011-06-20
  Filled 2011-06-19: qty 2

## 2011-06-19 MED ORDER — ACETAMINOPHEN 500 MG PO TABS
ORAL_TABLET | ORAL | Status: AC
Start: 2011-06-19 — End: 2011-06-19
  Administered 2011-06-19: 1000 mg via ORAL
  Filled 2011-06-19: qty 2

## 2011-06-19 MED ORDER — MIDAZOLAM HCL 2 MG/2ML IJ SOLN
INTRAMUSCULAR | Status: DC | PRN
Start: 2011-06-19 — End: 2011-06-19
  Administered 2011-06-19: 2 mg via INTRAVENOUS

## 2011-06-19 MED ORDER — OXYCODONE-ACETAMINOPHEN 5-325 MG PO TABS
1.0000 | ORAL_TABLET | Freq: Once | ORAL | Status: AC | PRN
Start: 2011-06-19 — End: 2011-06-19

## 2011-06-19 MED ORDER — HYDROMORPHONE HCL PF 1 MG/ML IJ SOLN
0.5000 mg | INTRAMUSCULAR | Status: DC | PRN
Start: 2011-06-19 — End: 2011-06-19
  Administered 2011-06-19: 0.5 mg via INTRAVENOUS

## 2011-06-19 MED ORDER — BUPIVACAINE HCL 0.25 % IJ SOLN
INTRAMUSCULAR | Status: DC | PRN
Start: 2011-06-19 — End: 2011-06-19
  Administered 2011-06-19: 30 mL

## 2011-06-19 SURGICAL SUPPLY — 113 items
ADHESIVE SKIN SURGISEAL .35ML (Suture) ×2 IMPLANT
APPLCATOR CHLORAPREP 26ML (Prep) ×2 IMPLANT
BLADE ELECTRODE DISP 6IN (Cautery) ×2 IMPLANT
BLADE S/SU RIBBACK CARB STL 15 (Blade) ×2 IMPLANT
BLADE SRGCLPR LF STRL PVT ADJ HD DISP (Blade) ×1
BLADE SURGICAL CLIPPER PIVOT ADJUSTABLE (Blade) ×1
BLADE SURGICAL CLIPPER PIVOT ADJUSTABLE HEAD 9661 PURPLE (Blade) ×1 IMPLANT
CATHETER DRN PVC HDRPH STRG HDGLD XL 24 (Drain) ×1
CATHETER THORACIC OD24 FR L22 IN (Drain) ×1
CATHETER THORACIC OD24 FR L22 IN STRAIGHT DRAINAGE HYDRAGLIDE XL PVC (Drain) ×1 IMPLANT
CATHETER URETHRAL OD18 FR 2 STAGGER (Catheter Urine) ×1
CATHETER URETHRAL OD18 FR 2 STAGGER DRAINAGE EYE ROUND CLOSED TIP (Catheter Urine) ×1 IMPLANT
CATHETER URTH PVC DOV RBNL 18FR 16IN LF (Catheter Urine) ×1
CONNECTOR 5 N 1 STRT (Connector) ×2 IMPLANT
CONTAINER SPEC 8OZ NS SNPON LID TRNLU (Suction) ×12 IMPLANT
CONTAINER SPEC PP 120ML BTIT SAMCO 48MM (Procedure Accessories) ×1
CONTAINER SPECIMAN STRLE120ML (Procedure Accessories) ×1
CONTAINER SPECIMEN BIO-TITE 120ML ORANGE SPECIMEN 48MM (Procedure Accessories) ×1 IMPLANT
COVER HNDL LIGHT SINGLES (Procedure Accessories) ×2 IMPLANT
DRESSING TRANSPARENT L2 3/4 IN X W2 3/8 (Dressing) ×2
DRESSING TRANSPARENT L2 3/4 IN X W2 3/8 IN POLYURETHANE ADHESIVE (Dressing) ×2 IMPLANT
DRESSING TRANSPARENT L4 3/4 IN X W4 IN (Dressing) ×2
DRESSING TRANSPARENT L4 3/4 IN X W4 IN POLYURETHANE ADHESIVE (Dressing) ×2 IMPLANT
DRESSING TRNS PU STD TGDRM 2.75INX2 3/8 (Dressing) ×2
DRESSING TRNS PU STD TGDRM 4.75X4IN LF (Dressing) ×2
ELECTRODE BLADE (Cautery) ×2 IMPLANT
GAUZE SPONGE VERSALON 4PLY 2X2 (Dressing) ×4 IMPLANT
GLOVE SURG BIOGEL SZ7.5 (Glove) ×4 IMPLANT
INACTIVE USE LAWSON 93583 (Gown) ×2 IMPLANT
KIT TISS CLSR 4ML PROGEL LF STRL SLNT (Sealant) ×1 IMPLANT
KIT TISSUE CLOSURE SEALANT ADHESIVE AIR LEAK PROGEL 4 ML PLEURA (Sealant) ×1 IMPLANT
KIT TISSUE CLOSURE SEALANT ADHESIVE AR (Sealant) ×1 IMPLANT
MASTISOL VIAL 2/3CC STRL (Skin Closure) ×2 IMPLANT
NEEDLE 25GA 1 1/2 (Needles) ×2 IMPLANT
NEEDLE REG BEVEL 19GX1.5IN (Needles) ×2 IMPLANT
PAD ELECTROSRG GRND REM W CRD (Procedure Accessories) ×2 IMPLANT
POUCH SURGICAL TISUE 4X6 200ML (Laparoscopy Supplies) ×2 IMPLANT
POUCH SURGICAL TISUE 5X8 750ML (Laparoscopy Supplies) ×2 IMPLANT
PROGEL PLEURAL AIR LEAK SEALANT ×2 IMPLANT
PROTECTOR TISS XS ALEXIS LF FLXB RTRCT (Retractor) ×1
PROTECTOR TISSUE XS FLEXIBLE RETRACTION (Retractor) ×1
PROTECTOR TISSUE XS FLEXIBLE RETRACTION RING ATRAUMATIC 360 DEGREE (Retractor) ×1 IMPLANT
RELOAD STAPLER 2 MM 2.5 MM 3 MM L45 MM (Staplers) ×4
RELOAD STAPLER 2 MM 2.5 MM 3 MM L45 MM ENDO GIA TITANIUM MEDIUM (Staplers) ×4 IMPLANT
RELOAD STAPLER 2 MM 2.5 MM 3 MM L60 MM (Staplers) ×4
RELOAD STAPLER 2 MM 2.5 MM 3 MM L60 MM ENDO GIA TITANIUM MEDIUM (Staplers) ×4 IMPLANT
RELOAD STAPLER 3 MM 3.5 MM 4 MM L45 MM (Staplers) ×4
RELOAD STAPLER 3 MM 3.5 MM 4 MM L45 MM ENDO GIA TITANIUM MEDIUM THICK (Staplers) ×4 IMPLANT
RELOAD STAPLER 3 MM 3.5 MM 4 MM L60 MM (Staplers) ×4
RELOAD STAPLER 3 MM 3.5 MM 4 MM L60 MM ENDO GIA TITANIUM MEDIUM THICK (Staplers) ×4 IMPLANT
RELOAD STAPLER MEDIUM CURVE L45 MM (Procedure Accessories) ×4
RELOAD STAPLER MEDIUM CURVE L45 MM TRI-STAPLE 2.0 VASCULAR TAN (Procedure Accessories) ×4 IMPLANT
RELOAD STAPLER MEDIUM CURVE L60 MM (Procedure Accessories) ×4
RELOAD STAPLER MEDIUM CURVE L60 MM TRI-STAPLE 2.0 VASCULAR TAN (Procedure Accessories) ×4 IMPLANT
RELOAD STPLR MED CRV 3-STPL 2 45MM LF (Procedure Accessories) ×4
RELOAD STPLR TI 2MM 2.5MM 3MM EGIA 45MM (Staplers) ×4
RELOAD STPLR TI 2MM 2.5MM 3MM EGIA 60MM (Staplers) ×4
RELOAD STPLR TI 3MM 3.5MM 4MM EGIA 45MM (Staplers) ×4
RELOAD STPLR TI 3MM 3.5MM 4MM EGIA 60MM (Staplers) ×4
SEALER LAPARSCOPIC DIVIDER 5MM (Laparoscopy Supplies) ×2 IMPLANT
SLEEVE SEQUEN COMP KNEE REG (Procedure Accessories) ×2 IMPLANT
SLEEVE TROCAR THORACIC 10/12MM (Endoscopic Supplies) ×2 IMPLANT
SPONGE IV EXCILON ANTIMRCB 2X2 (Dressing) ×2 IMPLANT
SPONGE SRG VISTEC 4X4IN LF STRL 16 PLY (Sponge) ×1
SPONGE SURGICAL L4 IN X W4 IN 16 PLY (Sponge) ×1 IMPLANT
SPONGE TONSIL 5/8IN (Sponge) ×4 IMPLANT
STAPLE RELOAD ARTIC CRVD 60MM (Procedure Accessories) ×4
STAPLER ENDGIA MED/THK CT 60MM (Staplers) ×8 IMPLANT
STRIP SKIN CLOSURE L4 IN X W1/4 IN (Dressing) ×1
STRIP SKIN CLOSURE L4 IN X W1/4 IN REINFORCE STERI-STRIP POLYESTER (Dressing) ×1 IMPLANT
STRIP STRSTRP SKNCLS 4X.25IN PLSTR REINF (Dressing) ×1
SUTURE ABS 0 CT VCL 36IN BRD COAT VIOL (Suture) ×4
SUTURE ABS 0 SH VCL 27IN BRD COAT UD (Suture) ×1
SUTURE ABS 1 CT1 VCL 27IN BRD COAT UD (Suture) ×8
SUTURE ABS 2 TP-1 VCL 54IN BRD COAT UD (Suture) ×3
SUTURE ABS 2-0 CT1 VCL 27IN BRD COAT UD (Suture) ×2
SUTURE ABS 2-0 SH VCL 27IN BRD COAT VIOL (Suture) ×2
SUTURE ABS 3-0 SH VCL 27IN BRD COAT VIOL (Suture) ×2
SUTURE COATED VICRYL 0 CT L36 IN BRAID (Suture) ×4 IMPLANT
SUTURE COATED VICRYL 0 SH L27 IN BRAID (Suture) ×1
SUTURE COATED VICRYL 0 SH L27 IN BRAID COATED UNDYED ABSORBABLE (Suture) ×1 IMPLANT
SUTURE COATED VICRYL 1 CT-1 L27 IN BRAID (Suture) ×8
SUTURE COATED VICRYL 1 CT-1 L27 IN BRAID COATED UNDYED ABSORBABLE (Suture) ×8 IMPLANT
SUTURE COATED VICRYL 2 TP-1 L54 IN BRAID (Suture) ×3 IMPLANT
SUTURE COATED VICRYL 2-0 CT-1 L27 IN (Suture) ×2
SUTURE COATED VICRYL 2-0 CT-1 L27 IN BRAID COATED UNDYED ABSORBABLE (Suture) ×2 IMPLANT
SUTURE COATED VICRYL 2-0 SH L27 IN BRAID (Suture) ×2
SUTURE COATED VICRYL 2-0 SH L27 IN BRAID COATED VIOLET ABSORBABLE (Suture) ×2 IMPLANT
SUTURE COATED VICRYL 3-0 SH L27 IN BRAID (Suture) ×2
SUTURE COATED VICRYL 3-0 SH L27 IN BRAID COATED VIOLET ABSORBABLE (Suture) ×2 IMPLANT
SUTURE MONOCRYL 3-0 SH (Suture) ×4 IMPLANT
SUTURE NABSB SLK 2-0 PRMHND 24IN BRD 2 (Suture) ×2
SUTURE PROLENE 4-0 SH 30IN (Suture) ×4 IMPLANT
SUTURE SILK 0 PSL 18IN (Suture) ×4 IMPLANT
SUTURE SILK 1 18IN (Suture) ×4 IMPLANT
SUTURE SILK 2-0 SH 30IN (Suture) ×4 IMPLANT
SUTURE SILK PERMA HAND BLACK 2-0 L24 IN (Suture) ×2
SUTURE SILK PERMA HAND BLACK 2-0 L24 IN BRAID 2 STRAND PRECUT (Suture) ×2 IMPLANT
SUTURE VICRYL 0 UR6 27IN (Suture) ×2 IMPLANT
SUTURE VICRYL 4-0 PS1 27IN (Suture) ×4 IMPLANT
SYSTEM IMAGING 8X6IN CLEARIFY MICROFIBER WARM HUB TRCR WIPE DSPSBL (Kits) ×1 IMPLANT
SYSTEM IMG MRFBR CLEARIFY 8X6IN WRM HUB (Kits) ×2
TAPE SURGICAL DURAPORE 3IN (Tape) ×2 IMPLANT
TAPE UMB CTTN 36X1/8IN LF STRL TIE NABSB (Suture) ×1
TAPE UMBILICAL L36 IN X W1/8 IN TIE (Suture) ×1 IMPLANT
TOWEL STERILE REUSABLE 8PK (Procedure Accessories) ×2 IMPLANT
TROCAR BLADELESS ENDO 5X100MM (Laparoscopy Supplies) ×2 IMPLANT
TUBING CONNECTING STERILE 10FT (Tubing) ×1
TUBING SUCTION ID3/16 IN L10 FT (Tubing) ×1
TUBING SUCTION ID3/16 IN L10 FT NONCONDUCTIVE STRAIGHT MALE FEMALE (Tubing) ×1 IMPLANT
TUNNELER SRG ONQ 17GA 8IN LF STRL SHTH (Procedure Accessories) ×1
TUNNELER SURGICAL L8 IN SHEATH OD17 GA (Procedure Accessories) ×1
TUNNELER SURGICAL L8 IN SHEATH OD17 GA ON-Q* (Procedure Accessories) ×1 IMPLANT

## 2011-06-19 NOTE — Progress Notes (Addendum)
PAIN MANAGEMENT POD 0  NOTE     Brent [name] is a 71 y.o. 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    No Known Allergies    Uncomplicated recovery from anesthetic: yes    Day of Surgery  Procedure:Procedure(s):  THORACOSCOPY, (VATS), LUNG BIOPSY 06/19/2011      History     Home Pain Regimen:   Prescriptions prior to admission   Medication Sig   . atorvastatin (LIPITOR) 40 MG tablet Take 40 mg by mouth daily.   Marland Kitchen lisinopril (PRINIVIL,ZESTRIL) 10 MG tablet Take 10 mg by mouth daily.   . pantoprazole (PROTONIX) 40 MG tablet Take 40 mg by mouth daily.   Marland Kitchen warfarin (COUMADIN) 10 MG tablet Take 8 mg by mouth daily. Stopped 06/11/11         Medication     Current Facility-Administered Medications   Medication Dose Route Frequency   . acetaminophen       . acetaminophen  1,000 mg Oral Q6H   . albuterol       . atorvastatin  40 mg Oral Daily   . cefazolin  2 g Intravenous Once   . docusate sodium  100 mg Oral BID   . HYDROmorphone       . lisinopril  10 mg Oral Daily   . pantoprazole  40 mg Oral Daily   . DISCONTD: albuterol  2 puff Inhalation Once   . DISCONTD: heparin (porcine)  5,000 Units Subcutaneous Q8H SCH       PRN Meds: bisacodyl, diphenhydrAMINE, fentaNYL, nalbuphine, naloxone, ondansetron, oxyCODONE-acetaminophen, promethazine, DISCONTD: bupivacaine, DISCONTD: HYDROmorphone, DISCONTD: HYDROmorphone, DISCONTD: HYDROmorphone, DISCONTD: meperidine, DISCONTD: ondansetron, DISCONTD: promethazine, DISCONTD: sterile water (bottle)         . HYDROmorphone 20 mg (06/19/11 2121)   . DISCONTD: lactated ringers Stopped (06/19/11 0930)   . DISCONTD: lactated ringers           Drug: Dilaudid    Continuous Rate: 0  PCA Demand Dose: 0     Lockout Interval: 0     Max Doses Per Hour: 0    Usage:  While in PACU pt received Dilaudid 0.5mg  IV and 4 mg po. Pain  severe with coughing. No Toradol due to recent coumadin. Per report from PACU nurse pt had refused ON-Q pump.     Chest tube: yes  On Q pump: no    Assessment:     Filed Vitals:    06/19/11 2100   BP:    Pulse: 72   Temp:    Resp:        Pain Score: 4/10, goal of 3-4/10. When coughing pain increases up to 8-9/10.   Location: right chest incision site and chest tube.         POSS: 1  Alert and oriented: yes  Itching: no  Nausea: no  Vomiting: no  NGT : no      Respiratory Assessment:  Oxygen Therapy  SpO2: 89 TO 91 % (06/19/11 2100)   No resp. Distress at this time.   O2 Device: OXIMIZER OR NRB (06/19/11 2000)  O2 Flow Rate (L/min): 15 L/min (06/19/11 1900)  Pulse Oximetry Type: Continuous (06/19/11 1220)  Comment:  Pt has been in [facility] PACU through out today due to difficulty weaning O2, pt has been on NRB 100% or  with oximizer as high as 15L. As per report by PACU nurse Alona Bene, pt can drop  his oxygen saturation as low as the 70's% with coughing episode.     Plan     Impression: post-op pain    Pain controlled with current therapy : no, pain med effective does not last as long as needed per pt's report.        Patient or surrogate educated re: safe use of PCA and/or pain medication.     Patient or surrogate verbalizes or demonstrates understanding.      Additional Comments:  Received a call from PACU nurse Alona Bene that Dr. Einar Grad requesting APS consult for pain management, as confirmed by bed side nurse Rod. Discussed with pt Dilaudid provided pain control pain down to 4/10 from 8-9/10 after Dilaudid 0.5mg  IV and the 4mg  po given in PACU but feels that pain increases again when he coughs. Main pain site is to right incision/chest tube. No toradol due to coumadin in recent past.  Discussed with Dr. Adolphus Birchwood, also aware of above respiratory status, pt was started on Dilaudid 0.2mg /10/6. Pt had asked about sleeping pill, discussed with pt due to resp. Status medication that may possible add to sedation level may not be  advisable at this time. Discussed proper pca use with pt and family. Splinting pillow given to pt. Discussed pca use for pain control as needed. Pt and wife expressed understanding, pt demonstrated proper pca use. Re-evaluate pt as needed.     Plan: Continue PCA at present setting:yes      Note was completed by Zaylin Pistilli 06/19/2011 9:51 PM    Addendum; 06/20/11 at 0427 called to check on pt earlier tonight about midnight, nurse report pt uses pca most often at the point of when he coughs and pain increases, pain was not as well controlled at the dose of 0.2mg . Has been awake and alert through out. As per report, pt received one dose of Dilaudid bolus of 0.6mg  x 1 per primary team, percocet was ordered and discontinued by primary team. APS did increase pca dose to Dilaudid 0.3mg /10/6 to possibly help with pain control with each pca use when he coughs. Called to check on pt again, per nurse pt is doing better with pain control as needed, but is now on bipap due to his lab and x-ray results.

## 2011-06-19 NOTE — Brief Op Note (Signed)
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 Item Serial No. Manufacturer Lot No. LRB No. Used Action   PROGEL PLEURAL AIR LEAK SEAL K - [id number] Sealant PROGEL PLEURAL AIR LEAK SEAL K   [employer] [id number] Right 1 Implanted       Drains:   24Fr chest tube    Specimens:        SPECIMENS (last 24 hours)      Non-Carefusion Specimens     Row Name 06/19/11 0800                Specimen Information    Specimen Testing Required Routine Pathology     Specimen ID (letter) A     Specimen Description Right Upper Lobe Lung Wedge     Specimen Information    Specimen Testing Required Routine Pathology     Specimen ID (letter) B     Specimen Description Right Lower Lobe Lung Wedge           Findings:   Very friable lung tissue, abnormal hard texture, easily bruised; RUL and RLL wedge biopsies sent    Complications:   none      Signed by: Noni Saupe, DO                                                                              [facility]

## 2011-06-19 NOTE — Transfer of Care (Signed)
Anesthesia Transfer of Care Note    Patient: Brent [name]    Procedures performed: Procedure(s) with comments:  THORACOSCOPY, (VATS), LUNG BIOPSY - BRONCH, RIGHT VATS LUNG BIOPSY    Anesthesia type: General ETT    Patient location:PACU    Last vitals:   Filed Vitals:    06/19/11 0931   BP: 134/71   Pulse: 92   Temp:    Resp: 22       Post pain: Patient not complaining of pain, continue current therapy     Mental Status:alert     Respiratory Function: tolerating face mask    Cardiovascular: stable    Nausea/Vomiting: patient not complaining of nausea or vomiting    Hydration Status: adequate    Post assessment: no apparent anesthetic complications, no reportable events and no evidence of recall

## 2011-06-19 NOTE — Anesthesia Preprocedure Evaluation (Addendum)
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        <IHSANLANDD>

## 2011-06-19 NOTE — Progress Notes (Signed)
PROGRESS NOTE    Date Time: 06/19/2011 11:57 AM  Patient Name: Brent [name],Brent [name]      Assessment:   1:  POD #0 s/p VATS lung bx for bilateral intermittent pulmonary infiltrates.  2. HTN, elevated cholesterol  3.  Distant laryngeal CA, s/p XRT (2004)    Plan:   CT as per surgery  Inc spiro, pain control.  Pt. Already has home O2.    [name]/u W/ Dr. Loyal Gambler early next week.    Subjective:   Pt. Is well known to Dr.  Loyal Gambler.  Has had episodes of intermittent bilateral infiltrates associated w/ fever / chills and hypoxia for past 6 months.  Developed an episode yesterday, underwent emergent biopsy today.    Presently pt. Evaluated in PACU, 3 hours after surgery.  He is awake on 6 l sat 90%.  He has significant pain at surgical site.  He has home O2 at home already.      Medications:     Current Facility-Administered Medications   Medication Dose Route Frequency   . acetaminophen       . acetaminophen  1,000 mg Oral Q6H   . albuterol       . cefazolin  2 g Intravenous Once   . heparin (porcine)  5,000 Units Subcutaneous Q8H SCH   . oxyCODONE-acetaminophen       . DISCONTD: albuterol  2 puff Inhalation Once       Review of Systems:   A comprehensive review of systems was: + pain at surg site.  Able to speak., alert.    Physical Exam:     Filed Vitals:    06/19/11 1150   BP: 82/48   Pulse: 78   Temp:    Resp: 15       Intake and Output Summary (Last 24 hours) at Date Time    Intake/Output Summary (Last 24 hours) at 06/19/11 1157  Last data filed at 06/19/11 0934   Gross per 24 hour   Intake    300 ml   Output     15 ml   Net    285 ml       Lung:  Poor inspiratory effort secondary to pain.  No rhonchi,  Coarse basilar   BS  CVS: rrr  ABD:  +BS  EXTR:  No edema    Labs:     Results     Procedure Component Value Units Date/Time    AFB culture [[id number]] Collected:06/19/11 0900    Specimen Information:Sputum / Lung Updated:06/19/11 0935    Anaerobic culture [[id number]] Collected:06/19/11 0900    Specimen Information:Other / Lung  Updated:06/19/11 0935    Fungus culture [[id number]] Collected:06/19/11 0900    Specimen Information:Other / Lung Updated:06/19/11 0935    Fungus culture [[id number]] Collected:06/19/11 0820    Specimen Information:Other / Lung Updated:06/19/11 0934    AFB culture [[id number]] Collected:06/19/11 0820    Specimen Information:Sputum / Lung Updated:06/19/11 0934    Anaerobic culture [[id number]] Collected:06/19/11 0820    Specimen Information:Other / Lung Updated:06/19/11 0934    Gram stain [[id number]] Collected:06/19/11 0820    Specimen Information:Other / Lung Updated:06/19/11 0934    PATHOLOGY REPORT SCAN [[id number]] Resulted:06/18/11 1656     Updated:06/18/11 1656    Narrative:    A scan was deleted from the Results section by H Interface [[id number]] on 06/18/2011 at  4:56 PM (File: [id number])          Recent CBC No results found for this basename: WHITEBLOODCE,RBC,HGB,HCT,MCV,MCH,MCHC,RDW,MPV,LABPLAT, NRBCA,  REFLX, ANRBA in the last 24 hours  Recent BMP No results found for this basename: GLU,BUN,CREAT,CA,NA,K,CL,CO2,AGAP in the last 24 hours    Rads:   Radiological Procedure reviewed.    Signed by: Darcus Austin

## 2011-06-19 NOTE — Op Note (Signed)
Procedure Date: 06/19/2011     Patient Type: I     SURGEON: Magda Kiel MD  ASSISTANT:  Noni Saupe MD     PREOPERATIVE DIAGNOSES:  Bilateral pulmonary infiltrates with intermittent episodes of fevers,  rigors, shortness of breath and oxygen desaturation.     POSTOPERATIVE DIAGNOSES:  Bilateral pulmonary infiltrates with intermittent episodes of fevers,  rigors, shortness of breath and oxygen desaturation, pending final  pathology and cultures.     TITLE OF PROCEDURE:  Right video-assisted thoracic surgery wedge resection of upper lobe and  lower lobes, bronchoscopy.     ANESTHESIA:  General.     COMPLICATIONS:  None.     SPECIMENS SENT TO PATHOLOGY:  Right upper and lower lobe wedges for culture, sensitivity, AFB, fungus and  permanent pathology.     PROCEDURE START TIME:  8:06 a.m.     PROCEDURE STOP TIME:  9:00 a.m.     INTRAVENOUS FLUIDS:  250 mL of crystalloid.     ESTIMATED BLOOD LOSS:  20 mL.     URINE OUTPUT:  Not recorded.     INDICATIONS:  Brent [name] is a 71 year old gentleman with an 52-month history of  intermittent episodes of fevers, cough, shortness of breath, and  desaturations.  He is noted to have in weaning and pulmonary infiltrates.   He is taken to the operating room today for a lung biopsy for potential  diagnosis.     DESCRIPTION OF PROCEDURE:  After informed consent was given, the patient was taken to the operating  room and placed in supine position.  Anesthesia approached the patient and  gave him general anesthesia with endotracheal intubation with a  double-lumen tube.  The pediatric bronchoscope was introduced.  Bilateral  bronchial trees were analyzed.  Primary and secondary carina were all sharp  except for some secretions coming off of the superior segment at the lower  lobe.  No abnormalities were seen.  There were no endobronchial lesions in  bilateral bronchial trees seen.  Bronchoscope was withdrawn.  The patient  was placed in left lateral decubitus and the right chest was  prepped and  draped in the usual surgical sterile fashion.  The chest was entered  through the eighth intercostal space posterior axillary line where a 5-mm  port was placed.  An accessory port was placed in the fifth intercostal  space of about 3 cm in length.  Initial inspection showed very edematous,  swollen lungs.  Based on the latest CT scan done a week ago, the most  significant areas of disease were located in the posterior aspect of the  lower portion of the upper lobe and the superior segment of the lower lobe,  so a wedge resection was performed of the upper lobe with a 60 purple load  stapler.  At this point, we already saw that the very friable lung caused  an intraparenchymal hematoma that shortly thereafter tamponaded without  significant expansion.  Then another 45-mm black Tri-Stapler was used to  complete the wedge.  Then, again on the superior segment of the upper lobe,  a very similar wedge resection was performed initially using a 60 black  Tri-Stapler.  Then, due to the friability of the lung, despite minimal  traction, there was an avulsion of that segment.  The wedge was removed and  sent for the appropriate studies.  I attempted to locally staple the area,  but that was not successful, causing further inadvertent injury of the  lung.  So at this point, hemostasis was achieved with a combination of  electrocautery and pressure hemostasis.  The chest cavity was fully  irrigated with saline.  Then we used a ProGEL to cover the area of the lung  parenchymal injury.  A single 28-French chest tube was placed under direct  vision.  The lung was inflated and noted to fill the entire cavity.   Accessory port was closed in multiple layers with absorbable suture.  Chest  tube was secured to the skin with 0 silk suture.  Sterile dressings were  applied.  The patient was placed in supine position, awakened from  anesthesia, extubated, and taken to the PACU for postoperative recovery in  good condition.            D:  06/19/2011 09:09 AM by Dr. Reuel Boom L. Einar Grad, MD ([id number])  T:  06/19/2011 12:22 PM by [id number]      Everlean Cherry: [id number]) (Doc ID: [id number])

## 2011-06-19 NOTE — PACU (Signed)
Pt attempted to start 2nd walk. Increase pain at CT site coughing.Desat to 84% on RBM. Pt back in chair. 02% up to 97% on NRM. Attempt to wean to NC. Dr Einar Grad notified of CXR results. Seen by pulmonolgist.

## 2011-06-19 NOTE — Consults (Signed)
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 Pt with higher O2 requirement from previous. He describes intermittent episode of f/c + sob since Jan 2012. He has been followed by Dr. Raleigh Lions since July 2012. ILD felt not to be bacterial in nature and pt is referred for VATS lung Bx. He is extubated and is On NRFM and will be converted to an oximizer. Pt is conversant and in NApD. He c/o primarily of pain from having the chest tube rub again his lung, esp when he coughs.    Objective:  Vital signs for last 24 hours:  Filed Vitals:    06/19/11 1900   BP: 111/62   Pulse: 80   Temp: 98.1 F (36.7 C)   Resp: 30       Scheduled Meds:  Current Facility-Administered Medications   Medication Dose Route Frequency   . acetaminophen       . acetaminophen  1,000 mg Oral Q6H   . albuterol       . atorvastatin  40 mg Oral Daily   . cefazolin  2 g Intravenous Once   . docusate sodium  100 mg Oral BID   . HYDROmorphone       . lisinopril  10 mg Oral Daily   . pantoprazole  40 mg Oral Daily   . DISCONTD: albuterol  2 puff Inhalation Once   . DISCONTD: heparin (porcine)  5,000 Units Subcutaneous Q8H SCH       Continuous Infusions:     . DISCONTD: lactated ringers Stopped (06/19/11 0930)   . DISCONTD: lactated ringers         Large frame obese male in no distress. Says his breathing is comfortable.  Chest - fair breath sounds bilaterally, no wheezing  Ht - RRR  Abd - soft, ND, NT, + bowel sounds  Ext - no cce, scd in place    Labs:    Recent Labs   [facility] 06/17/11 1840    WBC 6.15    HGB 13.2    HCT 39.2*    PLT 253    MCV 91.0       Recent Labs   Basename 06/17/11 1840    NA 141    K 3.9    CL 104    CO2 26    BUN 21.0*    CREAT 1.0    GLU 66*    CA 9.4    MG --    PHOS --       Recent Labs   Basename 06/17/11 1840    AST 29    ALT 35    ALKPHOS 61    PROT 7.0    ALB 3.8       Recent Labs   Basename 06/17/11 1840    PTT --    PT 14.7    INR 1.2*           Radiology Results (24 Hour)     Procedure Component  Value Units Date/Time    X-ray chest AP portable (daily) [[id number]] Collected:06/19/11 1125    Order Status:Completed  Updated:06/19/11 1227    Narrative:    HISTORY: Right vats.     COMMENT: Frontal view of the chest was obtained without comparison.     Right chest tube in place. Small right apical pneumothorax. Mild right  chest wall subcutaneous emphysema. Increased interstitial markings. Mild  right basal opacity. Cardiomediastinal silhouette within normal limits.  Suture line right  midlung.       Impression:      Small right apical pneumothorax. Discussed with nurse Alona Bene at 12:30  p.m. Increased interstitial markings and mild right basilar opacity.           Imaging personally reviewed, including: CXR    Assessment:    1: POD #0 s/p VATS lung bx for bilateral intermittent pulmonary infiltrates.   2. Hypoxia 2/2 post-op atelectasis, ILD, splinting respiration from pain  3. Distant laryngeal CA, s/p XRT (2004)   4. H/o PE  5. 20-40 pk yr h/o smoking, quit 20 yrs ago  6. H/o difficult intubation noted by Anesthesia in the pre-op note.        Plan:    PCA by anesth for pain management  Encourage IS  pCXR in am    F/u W/ Dr. Loyal Gambler       I have personally reviewed the patient's history and 24 hour interval events, along with vitals, labs, radiology images, ventilator settings and additional findings found in detail within ICU team notes from house staff, NPPs and nursing, with their care plans developed with and reviewed by me. So far today I have spent 45 minutes providing care for this patient excluding teaching and billable procedures, and not overlapping with any other providers.          Critical Care Time : 35 mn     LOS: 0 days

## 2011-06-20 ENCOUNTER — Inpatient Hospital Stay: Payer: Medicare Other

## 2011-06-20 DIAGNOSIS — E8779 Other fluid overload: Secondary | ICD-10-CM

## 2011-06-20 DIAGNOSIS — R52 Pain, unspecified: Secondary | ICD-10-CM

## 2011-06-20 DIAGNOSIS — J9600 Acute respiratory failure, unspecified whether with hypoxia or hypercapnia: Secondary | ICD-10-CM

## 2011-06-20 LAB — BLOOD GAS, ARTERIAL
Arterial Total CO2: 23.8 mEq/L — ABNORMAL LOW (ref 24.0–30.0)
Base Excess, Arterial: -1.7 mEq/L (ref <=2.0)
FIO2: 100 %
HCO3, Arterial: 22.6 mEq/L — ABNORMAL LOW (ref 23.0–29.0)
O2 Sat, Arterial: 90.9 % — ABNORMAL LOW (ref 95.0–100.0)
Temperature: 36.7
pCO2, Arterial: 38 mmHg (ref 35.0–45.0)
pH, Arterial: 7.389 (ref 7.350–7.450)
pO2, Arterial: 58.6 mmHg — ABNORMAL LOW (ref 80.0–90.0)

## 2011-06-20 LAB — CBC
Hematocrit: 44 % (ref 42.0–52.0)
Hgb: 14.4 g/dL (ref 13.0–17.0)
MCH: 30.3 pg (ref 28.0–32.0)
MCHC: 32.7 g/dL (ref 32.0–36.0)
MCV: 92.6 fL (ref 80.0–100.0)
MPV: 9.1 fL — ABNORMAL LOW (ref 9.4–12.3)
Nucleated RBC: 0 /100 WBC
Platelets: 233 10*3/uL (ref 140–400)
RBC: 4.75 10*6/uL (ref 4.70–6.00)
RDW: 16 % — ABNORMAL HIGH (ref 12–15)
WBC: 10.1 10*3/uL (ref 3.50–10.80)

## 2011-06-20 LAB — GFR: EGFR: >60

## 2011-06-20 LAB — B-TYPE NATRIURETIC PEPTIDE: B-Natriuretic Peptide: 34 pg/mL (ref 0–150)

## 2011-06-20 LAB — BASIC METABOLIC PANEL
BUN: 21 mg/dL — ABNORMAL HIGH (ref 8–20)
CO2: 25 mEq/L (ref 21–30)
Calcium: 8.7 mg/dL (ref 8.6–10.2)
Chloride: 95 mEq/L — ABNORMAL LOW (ref 98–107)
Creatinine: 1.1 mg/dL (ref 0.6–1.5)
Glucose: 106 mg/dL — ABNORMAL HIGH (ref 70–100)
Potassium: 4.2 mEq/L (ref 3.6–5.0)
Sodium: 133 mEq/L — ABNORMAL LOW (ref 136–146)

## 2011-06-20 LAB — PT/INR
PT INR: 1.1 (ref 0.9–1.1)
PT: 14.4 s (ref 12.6–15.0)

## 2011-06-20 LAB — MAGNESIUM: Magnesium: 1.9 mg/dL (ref 1.6–2.3)

## 2011-06-20 MED ORDER — POTASSIUM CHLORIDE CRYS ER 20 MEQ PO TBCR
20.00 meq | EXTENDED_RELEASE_TABLET | Freq: Once | ORAL | Status: AC
Start: 2011-06-20 — End: 2011-06-20
  Administered 2011-06-20: 20 meq via ORAL
  Filled 2011-06-20: qty 1

## 2011-06-20 MED ORDER — FUROSEMIDE 10 MG/ML IJ SOLN
INTRAMUSCULAR | Status: AC
Start: 2011-06-20 — End: 2011-06-20
  Administered 2011-06-20: 20 mg via INTRAVENOUS
  Filled 2011-06-20: qty 4

## 2011-06-20 MED ORDER — FUROSEMIDE 10 MG/ML IJ SOLN
20.00 mg | Freq: Once | INTRAMUSCULAR | Status: AC
Start: 2011-06-20 — End: 2011-06-20

## 2011-06-20 MED ORDER — HEPARIN SODIUM (PORCINE) 5000 UNIT/ML IJ SOLN
5000.00 [IU] | Freq: Two times a day (BID) | INTRAMUSCULAR | Status: DC
Start: 2011-06-20 — End: 2011-06-22
  Administered 2011-06-21 – 2011-06-22 (×3): 5000 [IU] via SUBCUTANEOUS
  Filled 2011-06-20: qty 1

## 2011-06-20 MED ORDER — FAMOTIDINE 20 MG PO TABS
20.00 mg | ORAL_TABLET | Freq: Two times a day (BID) | ORAL | Status: DC
Start: 2011-06-21 — End: 2011-06-23
  Administered 2011-06-21 – 2011-06-23 (×5): 20 mg via ORAL
  Filled 2011-06-20 (×5): qty 1

## 2011-06-20 MED ORDER — HYDROMORPHONE PCA 0.2 MG/ML BOLUS FROM PCA
0.50 mg | INTRAVENOUS | Status: DC | PRN
Start: 2011-06-20 — End: 2011-06-23
  Administered 2011-06-21: 0.5 mg via INTRAVENOUS

## 2011-06-20 MED ORDER — FUROSEMIDE 10 MG/ML IJ SOLN
40.00 mg | Freq: Once | INTRAMUSCULAR | Status: AC
Start: 2011-06-20 — End: 2011-06-20
  Administered 2011-06-20: 40 mg via INTRAVENOUS
  Filled 2011-06-20: qty 4

## 2011-06-20 MED ORDER — ALBUTEROL SULFATE (2.5 MG/3ML) 0.083% IN NEBU
2.50 mg | INHALATION_SOLUTION | Freq: Four times a day (QID) | RESPIRATORY_TRACT | Status: DC | PRN
Start: 2011-06-20 — End: 2011-06-23

## 2011-06-20 MED ORDER — HYDROMORPHONE PCA 0.2 MG/ML 100 ML (OUTSOURCED)
INTRAVENOUS | Status: DC
Start: 2011-06-20 — End: 2011-06-22
  Administered 2011-06-21: 0.2 mg via INTRAVENOUS
  Filled 2011-06-20: qty 100

## 2011-06-20 NOTE — Progress Notes (Addendum)
PAIN MANAGEMENT PROGRESS NOTE     Brent [name] is a 71 y.o. 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    No Known Allergies    1 Day Post-Op  Procedure:Procedure(s):  THORACOSCOPY, (VATS), LUNG BIOPSY 06/19/2011    Uncomplicated recovery from anesthetic: yes      Pain Score: 2-6/10  Location:right chest wall        POSS: 1    Drug:PCA Dilaudid   Continuous Rate:none  PCA Demand Dose: 0.3mg      Lockout Interval:     Max Doses Per Hour:6    Usage:7.1mg /12hr      Impression:   POST-OPERATIVE PAIN  SOMATIC PAIN      Additional Comments: Patient on BiPAP post op.  Pain better controlled this am for patient.  Presently texting and reading mail on phone.  At rest pain 2/10 but with CDB pain increases to 6/10 but improved from yesterday.   Would continue PCA while Chest Tube is in place.  Toradol not an option at this time with Coumadin history.   Continuous Pulse Ox while on PCA should patient transfer out of this unit.  DW bedside RN Shanda Bumps.      Plan:Continue PCA.  Addendum:  Dilaudid 0.5mg  IV PCA q 2 hr prn severe pain.  Continuous Pulse Oximetry while on PCA.      History   PMH:   Past Medical History   Diagnosis Date   . DOE (dyspnea on exertion)    . Hypertensive disorder    . Hyperlipidemia    . On home O2 09/2010     @night  2 liters, 88-96%   . Pneumonia    . Clotting disorder      04/2011, PE   . GERD (gastroesophageal reflux disease)    . Cancer      larnygeal cancer, 2004   . Chronic gout      hx   . Arthritis    . Fractures      PSH:   Past Surgical History   Procedure Date   . Joint replacement      Left TKR, 03/2009   . Tonsillectomy      BMI: Body mass index is 33.96 kg/(m^2).    Home Pain Regimen:   Prescriptions prior to admission   Medication Sig   . atorvastatin (LIPITOR) 40 MG tablet Take 40 mg by mouth daily.   Marland Kitchen lisinopril  (PRINIVIL,ZESTRIL) 10 MG tablet Take 10 mg by mouth daily.   . pantoprazole (PROTONIX) 40 MG tablet Take 40 mg by mouth daily.   Marland Kitchen warfarin (COUMADIN) 10 MG tablet Take 8 mg by mouth daily. Stopped 06/11/11       Current Medication     Current Facility-Administered Medications   Medication Dose Route Frequency   . acetaminophen       . acetaminophen  1,000 mg Oral Q6H   . atorvastatin  40 mg Oral Daily   . docusate sodium  100 mg Oral BID   . furosemide  20 mg Intravenous Once   . furosemide  20 mg Intravenous Once   . furosemide  40 mg Intravenous Once   . HYDROmorphone       . HYDROmorphone  0.6 mg Intravenous Once   . lisinopril  10 mg Oral Daily   . pantoprazole  40 mg Oral Daily   . potassium chloride SA  20 mEq Oral Once   .  DISCONTD: heparin (porcine)  5,000 Units Subcutaneous Q8H SCH       PRN Meds: albuterol, bisacodyl, diphenhydrAMINE, nalbuphine, naloxone, ondansetron, promethazine, DISCONTD: HYDROmorphone, DISCONTD: HYDROmorphone, DISCONTD: HYDROmorphone, DISCONTD: meperidine, DISCONTD: ondansetron, DISCONTD: oxyCODONE-acetaminophen, DISCONTD: oxyCODONE-acetaminophen, DISCONTD: oxyCODONE-acetaminophen, DISCONTD: promethazine      On Q pump: no      Assessment:     Filed Vitals:    06/20/11 1000   BP: 114/75   Pulse: 82   Temp:    Resp:        Alert and oriented: yes  Foley: no  Voiding: yes  Chest tube: YES; right  Itching: no  Nausea: no  Vomiting: no    Pain:  Pain Assessment  Pain Assessment: Numeric Scale (0-10) (06/20/11 1208)  Pain Score: 2-mild pain (up to 6/10 with CDB/activity) (06/20/11 1208)  POSS Score: Awake and Alert (06/20/11 1208)  Pain Location: Incision;Chest (06/20/11 1208)  Pain Orientation: Right (06/20/11 1208)  Pain Descriptors: Aching;Sharp (06/20/11 1208)  Pain Frequency: Increases with movement (06/20/11 1208)  Effect of Pain on Daily Activities: mild (06/20/11 1208)  Patient's Stated Comfort Functional Goal: 2-mild pain (06/20/11 1208)  Pain Intervention(s): Medication (See  eMAR);Other (Comment) (head of bed up. suggested splinting.) (06/19/11 2146)  Multiple Pain Sites: No (06/19/11 2146)                            Respiratory Assessment:  Oxygen Therapy  SpO2: 95 % (06/20/11 1208)  O2 Device: Other (comment) (BiPAP) (06/20/11 1208)  FiO2: 60 % (06/20/11 1000)  O2 Flow Rate (L/min): 15 L/min (06/20/11 0200)  Pulse Oximetry Type: Continuous (06/19/11 1220)  Oximetry Probe Site Changed: Yes (ear) (06/19/11 0931)  Comment:      Nutrition/GI Status:    Cardiac      Labs     Recent Labs   Basename 06/20/11 0147    WBC 10.10    HGB 14.4    HCT 44.0       Recent Labs   Basename 06/20/11 0147    BUN 21*    CREAT 1.1    AST --    ALT --       Recent Labs   Basename 06/20/11 0147    INR 1.1       Recent Labs   Basename 06/20/11 0147    PT 14.4    PTT --           Education       Patient or surrogate educated re: safe use of PCA and/or pain medication.     Patient or surrogate verbalizes or demonstrates understanding.    Other:        Note was completed by Asa Saunas 06/20/2011 12:11 PM       Pain Staff Follow up Note    Patient seen, agree with above note.  Continue PCA  Followup in AM    Valere Dross, M.D.

## 2011-06-20 NOTE — Progress Notes (Addendum)
PROGRESS NOTE    Date Time: 06/20/2011 12:47 PM  Patient Name: Brent [name],Brent [name]      Assessment:   1.  POD #1 VATS, wedge resection RUL, RLL.    2.  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  Hx PE found at [facility] Jan, 2013, on coumadin up until last week- Significant travel Hx prior to event.   4.  Hx Laryngeal CA, XRT 2004  5.  HTN by hx    Plan:   [facility] following  Placed on HFNC, may need to resume BiPAP for alveolar recruitment/ volume support  SQ heparin  Enc IS, flutter valve, secretion clearance  Pain control  EZ PAP   Prn Albuterol nebs  Repeat lasix this pm  BNP requested, not done    Subjective:   Primary C/O is pain at CT site, pleuritic pain w/ deep breaths and coughing  Doesn't feel particularly SOB  "They told me my oxygen level was low"  No CP, no GI sxs  Asking to eat    Medications:     Current Facility-Administered Medications   Medication Dose Route Frequency   . acetaminophen       . acetaminophen  1,000 mg Oral Q6H   . atorvastatin  40 mg Oral Daily   . docusate sodium  100 mg Oral BID   . furosemide  20 mg Intravenous Once   . furosemide  20 mg Intravenous Once   . furosemide  40 mg Intravenous Once   . HYDROmorphone   PCA        . HYDROmorphone  0.6 mg Intravenous Once   . lisinopril  10 mg Oral Daily   . pantoprazole  40 mg Oral Daily   . potassium chloride SA  20 mEq Oral Once   . DISCONTD: heparin (porcine)  5,000 Units Subcutaneous [facility]       Review of Systems:   A comprehensive review of systems was: Negative except above    Physical Exam:     Filed Vitals:    06/20/11 1200   BP: 114/75 - 128/97   Pulse: 84   Temp: 98.2 [name]       No fevers   Resp: 16       SaO2 93% on BiPAP  60%       Intake and Output  UOP 1700 w/ lasix       General appearance - sitting in bed  appearing comfortable on BiPAP  Mental status - AA/O, smiling, friendly  Neck - excess adipose  Chest - fine crackles throughout, R) sided end- inspiratory rub, no wheezes.  Reports he expectorated a quarter size amt of tan, old blood tinged mucous.    R) CT -20cm sxn w/old blood, serous, no SQ air or bubbling to H2O seal.           VTs abt 450- 500 on BiPAP 12/5, 60%, now transition to HFNC 100%, 15 litres maintaining SaO2 >90%  Heart - SR on BS monitor  Abdomen - Protuberant, soft  Extremities - no edema    Labs:     Results     Procedure Component Value Units Date/Time    Basic metabolic panel (QAM) [[id number]]  (Abnormal) Collected:06/20/11 0147    Specimen Information:Blood Updated:06/20/11 0232     Glucose 106 (H) mg/dL      BUN 21 (H) mg/dL      Creatinine 1.1 mg/dL      Calcium 8.7 mg/dL      Sodium 469 (L) mEq/L      Potassium 4.2 mEq/L      Chloride 95 (L) mEq/L      CO2 25 mEq/L     Magnesium (QAM) [[id number]] Collected:06/20/11 0147    Specimen Information:Blood Updated:06/20/11 0232     Magnesium 1.9 mg/dL     GFR [[id number]] [id number]:74/25/95 0147     EGFR >60.0   Updated:06/20/11 0232    CBC (QAM) [[id number]]  (Abnormal) Collected:06/20/11 0147    Specimen Information:Blood Updated:06/20/11 0224     WBC 10.10 x10 3/uL      RBC 4.75 x10 6/uL      Hgb 14.4 g/dL      Hematocrit 29.5 %      MCV 92.6 fL      MCH 30.3 pg      MCHC 32.7 g/dL      RDW 16 (H) %      Platelets 233 x10 3/uL      MPV 9.1 (L) fL      Nucleated RBC 0 /100 WBC     Protime-INR (QAM If patient is on Warfarin) [[id number]] Collected:06/20/11 0147    Specimen Information:Blood Updated:06/20/11 0221     Prothrombin Time 14.4 sec      PT INR 1.1      PT Anticoag. Given Within 48 hrs. None     Blood gas, arterial [[id number]]  (Abnormal) Collected:06/19/11 2354    Specimen Information:Blood, Arterial Updated:06/20/11 0006     pH,  Arterial 7.389      pCO2, Arterial 38.0 mmHg      pO2, Arterial 58.6 (L) mmHg      HCO3, Arterial 22.6 (L) mEq/L      Arterial Total CO2 23.8 (L) mEq/L      Base Excess, Arterial -1.7 mEq/L      O2 Sat, Arterial 90.9 (L) %      ABG CollectionSite Right Brachial      Temperature 36.7      FIO2 100 %      O2 Delivery Non-Rebreather     AFB culture [[id number]] Collected:06/19/11 0900    Specimen Information:Sputum / Lung Updated:06/19/11 2040    Narrative:    ORDER#: [id number]                                    ORDERED BY: Kristen Cardinal  SOURCE:  Lung Right Lower Lobe                        COLLECTED:  06/19/11 09:00  ANTIBIOTICS AT COLL.:                                RECEIVED :  06/19/11 13:17  Stain, Acid Fast                           FINAL       06/19/11 20:40  06/19/11   No acid fast bacilli seen.  Culture Acid Fast Bacillus (AFB)           PENDING      AFB culture [[id number]] Collected:06/19/11 0820    Specimen Information:Sputum / Lung Updated:06/19/11 2038    Narrative:    Stain, Gram was cancelled on 06/19/11 at 08:24 by HIS; RBS rule  ORDER#: [id number]                                    ORDERED BY: FORTES, DANIEL  SOURCE: Lung Right Upper Lobe                        COLLECTED:  06/19/11 08:20  ANTIBIOTICS AT COLL.:                                RECEIVED :  06/19/11 13:32  Stain, Gram was cancelled on 06/19/11 at 08:24 by HIS; RBS rule  Stain, Acid Fast                           FINAL       06/19/11 20:38  06/19/11   No acid fast bacilli seen.  Culture Acid Fast Bacillus (AFB)           PENDING      Fungus culture [[id number]] Collected:06/19/11 0900    Specimen Information:Other / Lung Updated:06/19/11 1607    Narrative:    ORDER#: [id number]                                    ORDERED BY: FORTES, DANIEL  SOURCE: Lung Right Lower Lobe                        COLLECTED:  06/19/11 09:00  ANTIBIOTICS AT COLL.:                                RECEIVED :  06/19/11 13:17  Stain, Fungal                               FINAL       06/19/11 16:07  06/19/11   No Fungal or Yeast Elements Seen  Culture Fungus                             PENDING      Fungus culture [[id number]] Collected:06/19/11  0820    Specimen Information:Other / Lung Updated:06/19/11 1605    Narrative:    ORDER#: [id number]                                    ORDERED BY: FORTES, DANIEL  SOURCE: Lung Right Upper Lobe                        COLLECTED:  06/19/11 08:20  ANTIBIOTICS AT COLL.:                                RECEIVED :  06/19/11 13:32  Stain, Fungal                              FINAL       06/19/11 16:05  06/19/11   No Fungal or Yeast Elements Seen  Culture Fungus                             PENDING      Gram stain [[id number]] Collected:06/19/11 0820    Specimen Information:Other / Lung Updated:06/19/11 1605    Narrative:    ORDER#: [id number]                                    ORDERED BY: FORTES, DANIEL  SOURCE: Lung Right Upper Lobe                        COLLECTED:  06/19/11 08:20  ANTIBIOTICS AT COLL.:                                RECEIVED :  06/19/11 13:32  Stain, Gram                                FINAL       06/19/11 16:05  06/19/11   Few WBCs             No organisms seen      Anaerobic culture [[id number]] Collected:06/19/11 0820    Specimen Information:Other / Lung Updated:06/19/11 1332    Anaerobic culture [[id number]] Collected:06/19/11 0900    Specimen Information:Other / Lung Updated:06/19/11 1317              Rads:   Radiological Procedure reviewed.    0600 CXR post op chg, somewhat hypoinflated, vasc fullness on underlying pneumonitis    Signed by: Barnett Hatter Asper  17:30 Seen on BiPAP 11/5 with VT 600 to 1000cc, full face mask and talking constantly. RR 20s with SaO2 93% on FiO2 70%. Looks very comfortable, but per bedside nurse, he decompensates rapidly on high flow oxygen. Patient insists Chest Tube is barrier to breathing, but AM CXR suggests some increase pulmonary infiltrates, possibly pulmonary edema. Diastolic dysfunction and mildly  reduced EF by echo and did receive diuretic today with >1 liter net negative. Agree with above.  ML [facility]

## 2011-06-20 NOTE — Progress Notes (Signed)
[facility])       ICU Daily Progress Note        Date Time: 06/20/2011 10:13 AM  Patient Name: Brent [name],Brent [name]  Attending Physician: Magda Kiel, MD  Room: FI211/FI211-01   Admit Date: 06/19/2011  LOS: 1 day        Assessment:     Problem List: There is no problem list on file for this patient.    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    #Cardio: HTN and HLD. Pt is well perfused and hemodynamically stable. Maintain MAP >65, continue lisinopril and atorvastatin. Volume overload- diuresis.    #Resp: Acute on chronic respiratory failure. POD 1 s/p VATS for R lung bx for ILD. Small R apical pneumothorax. Pt requiring BiPAP 12/6 60%. Will plan to transition to HFNC today as tolerated after diureses. CT in place, no leak. PTX stable.    #GI: GERD. Poor intake 2/2 BiPAP mask- continue PO diet, protonix for ppx.    #Infectious Disease (ID): Afebrile, no leukocytosis. Cultures pending from VATS.           #Hem/Onc: Hx PE 1/13 with resent CT scan negative for emboli, off Coumadin for procedure. SCDs, start SQ heparin today.    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 Diuresis with Lasix PRN, fluid balance goal negative 1L. If hyponatremia persists, fluid restriction.    #Endo:  Maintain euglycemia.    #Nutrition: Poor Po intake 2/2 BiPAP, HFNC will make it easier to eat if tolerated by patient.    #Lines: R CT, PIV    #Prophylaxis:   GI Prophylaxis:  Protonix  VTE Prophylaxis: SCDs and SQ heparin.    Code Status: full code    Dispo- continue care in CVICU.    Subjective:   71 y/o POD 1 s/p VATS R lung bx for ILD with acute on chronic respiratory failure, HTN, HLD and GERD. Pt now on BiPAP.    Medications:   Scheduled Meds:  Current  Facility-Administered Medications   Medication Dose Route Frequency   . acetaminophen       . acetaminophen  1,000 mg Oral Q6H   . atorvastatin  40 mg Oral Daily   . docusate sodium  100 mg Oral BID   . furosemide  20 mg Intravenous Once   . furosemide  20 mg Intravenous Once   . furosemide  40 mg Intravenous Once   . HYDROmorphone       . HYDROmorphone  0.6 mg Intravenous Once   . lisinopril  10 mg Oral Daily   . pantoprazole  40 mg Oral Daily   . potassium chloride SA  20 mEq Oral Once   . DISCONTD: albuterol  2 puff Inhalation Once   . DISCONTD: heparin (porcine)  5,000 Units Subcutaneous Q8H SCH     Continuous Infusions:       . HYDROmorphone 0.3 mg (06/20/11 0039)   . DISCONTD: HYDROmorphone 20 mg (06/19/11 2121)   . DISCONTD: lactated ringers Stopped (06/19/11 0930)   . DISCONTD: lactated ringers       Physical Exam:     Filed Vitals:    06/20/11 0900   BP: 114/74   Pulse:  84   Temp:    Resp:      Tm 98.3, HR82, RR16, 114/75 , 100 BiPAP 12/6 60%    Intake/Output Summary (Last 24 hours) at 06/20/11 1013  Last data filed at 06/20/11 0900   Gross per 24 hour   Intake 1083.4 ml   Output   1520 ml   Net -436.6 ml     General Appearance:  alert, well appearing, and in no distress and acyanotic, in no respiratory distress  Mental status:  alert, oriented to person, place, and time  Neuro:  alert, oriented, normal speech, no focal findings or movement disorder noted  Neck: No JVD  Lungs:  Mild crackles at bases 1/4 way up, upper fields clear, no use of accessory mm, R CT in place.  Cardiac: normal rate, regular rhythm, normal S1, S2, no murmurs, rubs, clicks or gallops  Abdomen:  soft, nontender, nondistended, no masses or organomegaly  Extremities: warm, dry, 2+ pedal pulses, Tr to 1+ edema at ankles   Skin:   normal coloration and turgor, no rashes, no suspicious skin lesions noted    Data:     Vent Settings:    Vent Settings  FiO2: 60 %  Vt Spontaneous (mL): 800 mL  PIP Observed (cm H2O): 12 cm H2O  Pressure  Support / IPAP: 12 cmH20    Lines/Drains/Airways:    Chest Tube 1 Right Pleural 24 Fr. (Active)   Intervention/Status -20 cm H2O 06/19/2011  9:34 AM   Chest Tube Air Leak No 06/19/2011  9:34 AM   Drainage Description Serosanguinous 06/19/2011  9:34 AM   Dressing Status Clean;Dry;Intact 06/19/2011  9:34 AM   Site Assessment Clean;Dry;Intact 06/19/2011  9:34 AM   CT Output (mL) 20 mL 06/20/2011  8:00 AM   Number of days:1           Labs:     Recent CBC   Recent Labs   [facility] 06/20/11 0147    RBC 4.75    HGB 14.4    HCT 44.0    MCV 92.6    MCH 30.3    MCHC 32.7    RDW 16*    MPV 9.1*    LABPLAT --     Recent BMP   Recent Labs   [facility] 06/20/11 0147    GLU 106*    BUN 21*    CREAT 1.1    CA 8.7    NA 133*    K 4.2    CL 95*    CO2 25       Labs (last 72 hours):  Recent Labs   Basename 06/20/11 0147 06/17/11 1840    WBC 10.10 6.15    HGB 14.4 13.2    HCT 44.0 39.2*    LABPLAT -- --     Recent Labs   Basename 06/20/11 0147 06/17/11 1840    PT 14.4 14.7    INR 1.1 1.2*    PTT -- --    Recent Labs   [facility] 06/20/11 0147 06/17/11 1840    NA 133* 141    K 4.2 3.9    CL 95* 104    CO2 25 26    BUN 21* 21.0*    CREAT 1.1 1.0    GLU 106* 66*    CA 8.7 9.4    MG 1.9 --    PHOS -- --               Rads:   Radiological  Imaging personally reviewed, including: CXR today, stable R CT and small R apical PTX.    Signed by: Renita Papa, PA-C  Date/Time: 06/20/2011 10:13 AM

## 2011-06-20 NOTE — Progress Notes (Addendum)
A/P: 81 yoM POD#1 s/p R VATS and wedge biopsies x2 - monitored in ICU on BiPAP, no respiratory distress this morning. Chest tube with serosanguinous output, no leak, CXR with small apical PTX and vascular congestion. Pain control adequate. Afebrile, hemodynamically stable.    - keep chest tube to suction  - consider high flow NC so that pt may eat  - pain control via PCA  - aggressive pulm toilet    Celesta Gentile R4, Thoracic, Spectra [phone]   S/d/w Dr. Einar Grad, staff

## 2011-06-20 NOTE — Progress Notes (Signed)
71 yo male s/p right VATS lung biopsy. Called by nursing for concern of patient desaturating on non rebreather. On exam patient has some chest pain around tube site, shortness of breath and tachypnea. He denies any other chest pain, nausea, vomiting light headedness or dizziness. When NRB removed patient desaturates to low 80s. Chest tube with minimal serous sanguinous output and no signs of leak. CXR and ABG ordered and demonstrated a low PaO2 and some congestive failure. No signs of extra respiratory muscles. Patient also states that he isn't using his pain control and that it hurts to take deep breaths and cough.    Tm/Tc: 98.3/98.0  HR: 74(62-96)  BP: 97/58(79-134/52-71)  RR: 30(8-40)  O2: 92(73-97on NRB 15L)  Gen: NAD  Resp: Bibasilar crackles, upper air fields CTAB  Cardio: RRR  Ext: bilateral calves soft and non tender    ABG: 7.389/38.0/58.6/22.6/-1.7  CXR: Findings suggest developing congestive failure. Right apical pneumothorax.    A/P: 71 yo male s/p right VATS lung biopsy with increased oxygen requirements, desaturations and CXR suggestive of congestive failure  -will increase PCA to help with pain associated with deep breathing  -will order Neb treatments Q6H  -will give lasix 20mg  IV x1, possible second dose depending on response to first  -history of PE but given progressive history of current symptoms and CXR findings, will hold off CTA of chest at this time  -if oxygen requirements continue to increase will transition to bipap and reconsider CTA    Lorelee Cover, R2  Surgery Resident  [phone]

## 2011-06-20 NOTE — Progress Notes (Addendum)
Date Time: 06/20/2011 12:45 PM  Patient Name: Brent [name],Brent [name]  Attending Physician: Magda Kiel, MD  Room: FI211/FI211-01   Admit Date: 06/19/2011  LOS: 1 day      A/P  1. Acute Hypoxic Respiratory Failure - pt on bipap s/p VATS on right, 7.38/39/58 bipap 12/6 fio2: 60%, pain was not well controlled prior and pt was not able to take deep breaths, concern hypoxic secondary to atlectasis secondary to pain and possible element of vascular congestion.  Will try pt on high flow O2, so he can eat .  Pt received lasix this am 40mg , offer prn as needed, keep pt dry.  No hx of heart failure, can check bnp.   Outpt pfts reviewed and grossly normal.  CT 100cc bloody drainage and not air leak. Pt can take incentive spirometer to 1500cc.  Still concerned, pt may require reintubation.  2. S/p VATS for nonspecific interstitial lung disease - pain control, adv diet, see #1, CT on suction, mgmt as per thoracic surgery  Code Status: Full Code    Prophylaxis:   GI Prophylaxis: protonix  VTE Prophylaxis: start scds and sq heparin    I have personally reviewed the patient's history and 24 hour interval events, along with vitals, labs, radiology images  and additional findings including ventilator settings found in detail within ICU team notes from house staff, NPPs and nursing, with their care plans developed with and reviewed by me.     So far today I have spent 39 minutes providing critical  care for this patient excluding teaching and billable procedures, and not overlapping with any other providers.    S: Last 24hrs:  S/p surgery and pt on bipap overnight, now on high flow 02      Physical Exam:     Temp:  [97.1 [name] (36.2 C)-98.3 [name] (36.8 C)] 98.2 [name] (36.8 C)  Heart Rate:  [62-96] 84   Resp Rate:  [13-40] 16   BP: (83-130)/(49-97) 130/72 mmHg  FiO2:  [60 %-100 %] 60 %    Intake and Output Summary (Last 24 hours) at Date Time    Intake/Output Summary (Last 24 hours) at 06/20/11 1245  Last data filed at 06/20/11 1100   Gross  per 24 hour   Intake  883.4 ml   Output   1935 ml   Net -1051.6 ml     Invasive ICU Hemodynamics:          Limited Exam  General: Elderly, good spirits  Lungs: crackles R, decreased breath sounds on right, no wheezing, CT site on right c/d/i  Cardiac: s1.s2 no m/r/g  Abdomen: soft, no rbd, no guarding  Extremities: able to move all ext, no signt edema, no focal defecit         Medications:   Scheduled Meds:  Current Facility-Administered Medications   Medication Dose Route Frequency   . acetaminophen       . acetaminophen  1,000 mg Oral Q6H   . atorvastatin  40 mg Oral Daily   . docusate sodium  100 mg Oral BID   . furosemide  20 mg Intravenous Once   . furosemide  20 mg Intravenous Once   . furosemide  40 mg Intravenous Once   . HYDROmorphone       . HYDROmorphone  0.6 mg Intravenous Once   . lisinopril  10 mg Oral Daily   . pantoprazole  40 mg Oral Daily   . potassium chloride SA  20 mEq Oral Once   .  DISCONTD: heparin (porcine)  5,000 Units Subcutaneous Q8H SCH       Continuous Infusions:       . HYDROmorphone 0.3 mg (06/20/11 0039)   . DISCONTD: HYDROmorphone 20 mg (06/19/11 2121)   . DISCONTD: lactated ringers Stopped (06/19/11 0930)   . DISCONTD: lactated ringers          PRN Meds:      BMP:  Recent Labs   Basename 06/20/11 0147 06/17/11 1840    NA 133* 141    K 4.2 3.9    CL 95* 104    CO2 25 26    BUN 21* 21.0*    CREAT 1.1 1.0    GLU 106* 66*    CA 8.7 9.4    MG 1.9 --    PHOS -- --       Estimated Creatinine Clearance: 74.2 ml/min (based on Cr of 1.1).    LFTs:  Recent Labs   [facility] 06/17/11 1840    AST 29    ALT 35    ALKPHOS 61    BILITOTAL 0.7    PROT 7.0    ALB 3.8       Lactate:  No results found for this basename: LACTATE in the last 72 hours    CBC:  Recent Labs   Basename 06/20/11 0147 06/17/11 1840    WBC 10.10 6.15    HGB 14.4 13.2    HCT 44.0 39.2*    MCV 92.6 91.0    PLT 233 253       ABG:  Recent Labs   Basename 06/19/11 2354    PHART 7.389    PCO2ART 38.0    PO2ART 58.6*    HCO3ART 22.6*     BEART -1.7       Imaging last 24hrs:  The cardiomediastinal silhouette stable. There is a mild   increase in vascular congestion. A right chest tube is stable. Again   noted is a small right apical pneumothorax.

## 2011-06-21 ENCOUNTER — Inpatient Hospital Stay: Payer: Medicare Other

## 2011-06-21 LAB — BASIC METABOLIC PANEL
BUN: 27 mg/dL — ABNORMAL HIGH (ref 8–20)
CO2: 29 mEq/L (ref 21–30)
Calcium: 8.4 mg/dL — ABNORMAL LOW (ref 8.6–10.2)
Chloride: 96 mEq/L — ABNORMAL LOW (ref 98–107)
Creatinine: 0.8 mg/dL (ref 0.6–1.5)
Glucose: 109 mg/dL — ABNORMAL HIGH (ref 70–100)
Potassium: 4.3 mEq/L (ref 3.6–5.0)
Sodium: 135 mEq/L — ABNORMAL LOW (ref 136–146)

## 2011-06-21 LAB — CBC
Hematocrit: 34.5 % — ABNORMAL LOW (ref 42.0–52.0)
Hgb: 11.5 g/dL — ABNORMAL LOW (ref 13.0–17.0)
MCH: 30.3 pg (ref 28.0–32.0)
MCHC: 33.3 g/dL (ref 32.0–36.0)
MCV: 90.8 fL (ref 80.0–100.0)
MPV: 9.2 fL — ABNORMAL LOW (ref 9.4–12.3)
Nucleated RBC: 0 /100 WBC
Platelets: 202 10*3/uL (ref 140–400)
RBC: 3.8 10*6/uL — ABNORMAL LOW (ref 4.70–6.00)
RDW: 16 % — ABNORMAL HIGH (ref 12–15)
WBC: 12.01 10*3/uL — ABNORMAL HIGH (ref 3.50–10.80)

## 2011-06-21 LAB — PT/INR
PT INR: 1.3 — ABNORMAL HIGH (ref 0.9–1.1)
PT: 16.2 s — ABNORMAL HIGH (ref 12.6–15.0)

## 2011-06-21 LAB — BLOOD GAS, ARTERIAL
Arterial Total CO2: 25.3 mEq/L (ref 24.0–30.0)
Base Excess, Arterial: 0 mEq/L (ref <=2.0)
FIO2: 60 %
HCO3, Arterial: 24.1 mEq/L (ref 23.0–29.0)
O2 Sat, Arterial: 97.8 % (ref 95.0–100.0)
Temperature: 36.3
pCO2, Arterial: 38 mmHg (ref 35.0–45.0)
pH, Arterial: 7.416 (ref 7.350–7.450)
pO2, Arterial: 90.8 mmHg — ABNORMAL HIGH (ref 80.0–90.0)

## 2011-06-21 LAB — GFR: EGFR: >60

## 2011-06-21 LAB — MAGNESIUM: Magnesium: 1.9 mg/dL (ref 1.6–2.3)

## 2011-06-21 MED ORDER — IBUPROFEN 400 MG PO TABS
400.00 mg | ORAL_TABLET | Freq: Three times a day (TID) | ORAL | Status: DC | PRN
Start: 2011-06-21 — End: 2011-06-22
  Administered 2011-06-21 – 2011-06-22 (×2): 400 mg via ORAL
  Filled 2011-06-21 (×2): qty 1

## 2011-06-21 MED ORDER — FUROSEMIDE 10 MG/ML IJ SOLN
40.00 mg | Freq: Four times a day (QID) | INTRAMUSCULAR | Status: DC
Start: 2011-06-21 — End: 2011-06-22
  Administered 2011-06-21 (×4): 40 mg via INTRAVENOUS
  Filled 2011-06-21 (×4): qty 4

## 2011-06-21 MED ORDER — METHYLPREDNISOLONE SODIUM SUCC 125 MG IJ SOLR
60.00 mg | Freq: Three times a day (TID) | INTRAMUSCULAR | Status: DC
Start: 2011-06-21 — End: 2011-06-21

## 2011-06-21 MED ORDER — METHYLPREDNISOLONE SODIUM SUCC 125 MG IJ SOLR
60.00 mg | Freq: Four times a day (QID) | INTRAMUSCULAR | Status: DC
Start: 2011-06-21 — End: 2011-06-23
  Administered 2011-06-21 – 2011-06-23 (×9): 60 mg via INTRAVENOUS
  Filled 2011-06-21 (×5): qty 2

## 2011-06-21 MED ORDER — SENNOSIDES 8.8MG/5ML UNIT DOSE SYRINGE
8.80 mg | ORAL_SOLUTION | Freq: Two times a day (BID) | ORAL | Status: DC
Start: 2011-06-21 — End: 2011-06-23
  Administered 2011-06-21 – 2011-06-22 (×4): 8.8 mg via ORAL
  Filled 2011-06-21 (×6): qty 5

## 2011-06-21 NOTE — Progress Notes (Signed)
PAIN MANAGEMENT PROGRESS NOTE (IV analgesia)    Brent [name] is a 71 y.o. 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  Pt reports worse pain in the lower abdominal and groin region.       LOS:  LOS: 2 days   POD: 2 Days Post-Op    Pain relief with HYDROmorphone:  Continuous rate: 0  Lockout Interval: 10  Max Doses per Hour: 6  Usage:dose is 0.3mg  dilaudid.  He has used about 3 mg in 3.5 hrs.          Plan       Plan: We will continue the pca as ordered.   Consider adding toradol for the abdominal wall pain, since this seems to be bothersome to him.  (cr is 0.8)  When appropriate, we will transition to oral analgesics.    History   PMH:   Past Medical History   Diagnosis Date   . DOE (dyspnea on exertion)    . Hypertensive disorder    . Hyperlipidemia    . On home O2 09/2010     @night  2 liters, 88-96%   . Pneumonia    . Clotting disorder      04/2011, PE   . GERD (gastroesophageal reflux disease)    . Cancer      larnygeal cancer, 2004   . Chronic gout      hx   . Arthritis    . Fractures      PSH:   Past Surgical History   Procedure Date   . Joint replacement      Left TKR, 03/2009   . Tonsillectomy      BMI: Body mass index is 34.60 kg/(m^2).    Home Pain Regimen:   Prescriptions prior to admission   Medication Sig   . atorvastatin (LIPITOR) 40 MG tablet Take 40 mg by mouth daily.   Marland Kitchen lisinopril (PRINIVIL,ZESTRIL) 10 MG tablet Take 10 mg by mouth daily.   . pantoprazole (PROTONIX) 40 MG tablet Take 40 mg by mouth daily.   Marland Kitchen warfarin (COUMADIN) 10 MG tablet Take 8 mg by mouth daily. Stopped 06/11/11         Medication     Current Facility-Administered Medications   Medication Dose Route Frequency   . acetaminophen  1,000 mg Oral Q6H   . atorvastatin  40 mg Oral Daily   . docusate sodium  100 mg Oral BID   . famotidine  20 mg Oral 2XDAY at 0800 & 1200   .  furosemide  40 mg Intravenous Once   . heparin (porcine)  5,000 Units Subcutaneous Q12H   . lisinopril  10 mg Oral Daily   . potassium chloride SA  20 mEq Oral Once   . DISCONTD: pantoprazole  40 mg Oral Daily           On Q pump: no    Assessment:   Pain:  Pt. Notes pain 5 on scale of 1-10  Filed Vitals:    06/21/11 0600   BP: 117/71   Pulse: 78   Temp:    Resp:      Alert and oriented: yes    Side effects:none     Foley: yes  Voiding: no  Chest tube: yes      Respiratory Assessment:  Oxygen Therapy  SpO2: 98 % (06/21/11  0600)  O2 Device: Other (comment) (BIPAP) (06/21/11 0448)  FiO2: 80 % (06/21/11 0448)  O2 Flow Rate (L/min): 50 L/min (06/21/11 0429)  Pulse Oximetry Type: Continuous (06/19/11 1220)  Oximetry Probe Site Changed: Yes (ear) (06/19/11 0931)  Resp Rate: 16  (06/20/11 0844)    Nutrition/GI Status:  Nutrition  Diet Type: Cardiac (06/20/11 2000)  Feeding: Able to feed self (06/20/11 1500)  Appetite: Good (06/20/11 1500)          Completed by Ardith Dark  Date: 06/21/2011  Time: 7:51 AM

## 2011-06-21 NOTE — Plan of Care (Signed)
Pt is alert and oriented x 3, follows commands, MAe x 4 strongly and equally. Pt remain on Dilaudid PCA, with c/o pain mostly on the right and left groin, 10-06-08 pain scale, gave an extra 0.5 bolus. Pt was on high NC until 0430, continue to desat on 85%, switched to BIPAP.     Plan: will keep pt comfortable, increase ADL.

## 2011-06-21 NOTE — Progress Notes (Addendum)
[facility] Attending:    After further d/s of care with thoracic surgery who had conferred with pulmonary.  We will start pt on steroids due to bx results pending, pt bnp is normal, no gross signs of infection, cxry worsening with worsening oxygenation.

## 2011-06-21 NOTE — Progress Notes (Signed)
Date Time: 06/21/2011 9:34 AM  Patient Name: Brent [name],Brent [name]  Attending Physician: Magda Kiel, MD  Room: FI211/FI211-01   Admit Date: 06/19/2011  LOS: 2 days      A/P  1. Acute Hypoxic Respiratory Failure - CT 300cc total (200 cc overnight) drainage and no air leak. Pt can take incentive spirometer to 1500cc.  Pt on high flow O2 requiring 40L and fio2: 90%.  Will aggressively diurese today, scheduled lasix and scheduled chemistries.  Abg requested for late afternoon.  Pt will likely continue to benefit to positive pressure ventilation with volume overload. Pt refusing foley for strict i/os.    2. S/p VATS for nonspecific interstitial lung disease -  tolerating diet, see #1, CT on suction, mgmt as per thoracic surgery.  Pain control and stool regimen started.  Code Status: Full Code    Prophylaxis:   GI Prophylaxis: protonix  VTE Prophylaxis:  scds and sq heparin    I have personally reviewed the patient's history and 24 hour interval events, along with vitals, labs, radiology images  and additional findings including ventilator settings found in detail within ICU team notes from house staff, NPPs and nursing, with their care plans developed with and reviewed by me.     So far today I have spent 41 minutes providing critical  care for this patient excluding teaching and billable procedures, and not overlapping with any other providers.    S: Last 24hrs:  Pt still short of breath, but cont to tolerate oral diet.      Physical Exam:     Temp:  [98.2 [name] (36.8 C)-100 [name] (37.8 C)] 99.4 [name] (37.4 C)  Heart Rate:  [76-98] 84   BP: (97-141)/(56-80) 119/70 mmHg  FiO2:  [60 %-100 %] 97 %    Intake and Output Summary (Last 24 hours) at Date Time    Intake/Output Summary (Last 24 hours) at 06/21/11 0934  Last data filed at 06/21/11 0900   Gross per 24 hour   Intake  548.4 ml   Output   2275 ml   Net -1726.6 ml     Invasive ICU Hemodynamics:          Limited Exam  General: Elderly, good spirits  Lungs: crackles R,  decreased breath sounds on right, no wheezing, CT site on right c/d/i  Cardiac: s1.s2 no m/r/g  Abdomen: soft, no rbd, no guarding  Extremities: able to move all ext, no signt edema, no focal defecit         Medications:   Scheduled Meds:  Current Facility-Administered Medications   Medication Dose Route Frequency   . acetaminophen  1,000 mg Oral Q6H   . atorvastatin  40 mg Oral Daily   . docusate sodium  100 mg Oral BID   . famotidine  20 mg Oral 2XDAY at 0800 & 1200   . furosemide  40 mg Intravenous Once   . furosemide  40 mg Intravenous QID   . heparin (porcine)  5,000 Units Subcutaneous Q12H   . lisinopril  10 mg Oral Daily   . potassium chloride SA  20 mEq Oral Once   . senna  8.8 mg Oral BID   . DISCONTD: pantoprazole  40 mg Oral Daily       Continuous Infusions:       . HYDROmorphone 0.2 mg (06/21/11 0025)        PRN Meds:      BMP:  Recent Labs   Basename 06/21/11 0153 06/20/11  0147    NA 135* 133*    K 4.3 4.2    CL 96* 95*    CO2 29 25    BUN 27* 21*    CREAT 0.8 1.1    GLU 109* 106*    CA 8.4* 8.7    MG 1.9 1.9    PHOS -- --       Estimated Creatinine Clearance: 82.3 ml/min (based on Cr of 0.8).    LFTs:  No results found for this basename: AST:3,ALT:3,ALKPHOS:3,BILITOTAL:3,PROT:3,ALB:3 in the last 72 hours    Lactate:  No results found for this basename: LACTATE in the last 72 hours    CBC:  Recent Labs   Basename 06/21/11 0153 06/20/11 0147    WBC 12.01* 10.10    HGB 11.5* 14.4    HCT 34.5* 44.0    MCV 90.8 92.6    PLT 202 233       ABG:  Recent Labs   Basename 06/19/11 2354    PHART 7.389    PCO2ART 38.0    PO2ART 58.6*    HCO3ART 22.6*    BEART -1.7       Imaging last 24hrs:    Right chest tube in unchanged position.   LUNGS/PLEURA: There are increasing hazy bilateral pulmonary opacities   and persistent widening of the vascular pedicle. No pleural effusions.   No significant pneumothorax.

## 2011-06-21 NOTE — Progress Notes (Addendum)
A/P: 54 yoM POD#2 s/p R VATS and wedge biopsies x2 - comfortable without respiratory distress on high flow NC this morning, saturating in mid 90's. Chest tube with serosanguinous output, no leak, CXR with worsening pulmonary edema. Cultures NGTD. Pain control adequate, tolerating diet. Afebrile, hemodynamically stable. Cultures NGTD, path pending.    - keep chest tube to suction  - pain control via PCA  - aggressive pulm toilet  - OOB if tolerated  - diurese today    Celesta Gentile R4, Thoracic, Spectra [phone]     Addendum:   - will d/c chest tube today    S/d/w Dr. Einar Grad, staff

## 2011-06-21 NOTE — Progress Notes (Signed)
PROGRESS NOTE    Date Time: 06/21/2011 8:49 AM  Patient Name: Brent [name],Brent [name]      Assessment:   1. POD #2 VATS, wedge resection RUL, RLL.   2. 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 Hx PE found at [facility] Jan, 2013, on coumadin up until last week- Significant travel Hx prior to event.   4. Hx Laryngeal CA, XRT 2004   5. HTN by hx      Plan:   Continue with IS  Await biopsies  Wean Fio2 for saturation greater or equal to 90%  Subjective:   Chest pain less    Medications:     Current Facility-Administered Medications   Medication Dose Route Frequency   . acetaminophen  1,000 mg Oral Q6H   . atorvastatin  40 mg Oral Daily   . docusate sodium  100 mg Oral BID   . famotidine  20 mg Oral 2XDAY at 0800 & 1200   . furosemide  40 mg Intravenous Once   . heparin (porcine)  5,000 Units Subcutaneous Q12H   . lisinopril  10 mg Oral Daily   . potassium chloride SA  20 mEq Oral Once   . senna  8.8 mg Oral BID   . DISCONTD: pantoprazole  40 mg Oral Daily       Review of Systems:   A comprehensive review of systems was: General ROS: negative for - sleep disturbance  Respiratory ROS: positive for - shortness of breath  Gastrointestinal ROS: positive for - abdominal pain  Musculoskeletal ROS: positive for - joint stiffness    Physical Exam:     Filed Vitals:    06/21/11 0600   BP: 117/71   Pulse: 78   Temp:    Resp:        Intake and Output Summary (Last 24 hours) at Date Time    Intake/Output Summary (Last 24 hours) at 06/21/11 0849  Last data filed at 06/21/11 0736   Gross per 24 hour   Intake  508.4 ml   Output   2460 ml   Net -1951.6 ml       Mental status - alert, oriented to person, place, and time, normal mood, behavior, speech, dress, motor activity, and thought processes  Chest - clear to auscultation, no wheezes, rales or  rhonchi, symmetric air entry, chest wall tenderness noted at chest tube site  Heart - normal rate, regular rhythm, normal S1, S2, no murmurs, rubs, clicks or gallops, S1 and S2 normal  Musculoskeletal - no joint tenderness, deformity or swelling, no muscular tenderness noted    Labs:     Results     Procedure Component Value Units Date/Time    Basic metabolic panel (QAM) [[id number]]  (Abnormal) Collected:06/21/11 0153    Specimen Information:Blood Updated:06/21/11 0238     Glucose 109 (H) mg/dL      BUN 27 (H) mg/dL      Creatinine 0.8 mg/dL      Calcium 8.4 (L) mg/dL      Sodium 962 (L) mEq/L      Potassium 4.3 mEq/L      Chloride 96 (L) mEq/L      CO2 29 mEq/L     Magnesium (QAM) [[id number]] Collected:06/21/11 0153    Specimen Information:Blood Updated:06/21/11  5284     Magnesium 1.9 mg/dL     GFR [[id number]] [id number]:34/74/25 0153     EGFR >60.0   Updated:06/21/11 0238    Protime-INR (QAM If patient is on Warfarin) [[id number]]  (Abnormal) Collected:06/21/11 0153    Specimen Information:Blood Updated:06/21/11 0234     Prothrombin Time 16.2 (H) sec      PT INR 1.3 (H)      PT Anticoag. Given Within 48 hrs. None     CBC (QAM) [[id number]]  (Abnormal) Collected:06/21/11 0153    Specimen Information:Blood Updated:06/21/11 0224     WBC 12.01 (H) x10 3/uL      RBC 3.80 (L) x10 6/uL      Hgb 11.5 (L) g/dL      Hematocrit 16.6 (L) %      MCV 90.8 fL      MCH 30.3 pg      MCHC 33.3 g/dL      RDW 16 (H) %      Platelets 202 x10 3/uL      MPV 9.2 (L) fL      Nucleated RBC 0 /100 WBC     B-type Natriuretic Peptide [[id number]] Collected:06/21/11 0153    Specimen Information:Blood Updated:06/21/11 0153    B-type Natriuretic Peptide [[id number]] Collected:06/20/11 1511    Specimen Information:Blood Updated:06/20/11 1550     B-Natriuretic Peptide 34 pg/mL           Labs reviewed    Rads:   Radiological Procedure reviewed.    Signed by: Doroteo Bradford

## 2011-06-22 ENCOUNTER — Inpatient Hospital Stay: Payer: Medicare Other

## 2011-06-22 LAB — BASIC METABOLIC PANEL
BUN: 26 mg/dL — ABNORMAL HIGH (ref 8–20)
BUN: 29 mg/dL — ABNORMAL HIGH (ref 8–20)
CO2: 29 mEq/L (ref 21–30)
CO2: 30 mEq/L (ref 21–30)
Calcium: 8.6 mg/dL (ref 8.6–10.2)
Calcium: 8.8 mg/dL (ref 8.6–10.2)
Chloride: 97 mEq/L — ABNORMAL LOW (ref 98–107)
Chloride: 97 mEq/L — ABNORMAL LOW (ref 98–107)
Creatinine: 1 mg/dL (ref 0.6–1.5)
Creatinine: 1 mg/dL (ref 0.6–1.5)
Glucose: 149 mg/dL — ABNORMAL HIGH (ref 70–100)
Glucose: 163 mg/dL — ABNORMAL HIGH (ref 70–100)
Potassium: 3.9 mEq/L (ref 3.6–5.0)
Potassium: 3.8 mEq/L (ref 3.6–5.0)
Sodium: 138 mEq/L (ref 136–146)
Sodium: 139 mEq/L (ref 136–146)

## 2011-06-22 LAB — MAGNESIUM
Magnesium: 2 mg/dL (ref 1.6–2.3)
Magnesium: 2.1 mg/dL (ref 1.6–2.3)

## 2011-06-22 LAB — CBC
Hematocrit: 32.6 % — ABNORMAL LOW (ref 42.0–52.0)
Hgb: 11.2 g/dL — ABNORMAL LOW (ref 13.0–17.0)
MCH: 30.4 pg (ref 28.0–32.0)
MCHC: 34.4 g/dL (ref 32.0–36.0)
MCV: 88.6 fL (ref 80.0–100.0)
MPV: 9.7 fL (ref 9.4–12.3)
Nucleated RBC: 0 /100 WBC
Platelets: 250 10*3/uL (ref 140–400)
RBC: 3.68 10*6/uL — ABNORMAL LOW (ref 4.70–6.00)
RDW: 15 % (ref 12–15)
WBC: 7.53 10*3/uL (ref 3.50–10.80)

## 2011-06-22 LAB — GFR
EGFR: >60
EGFR: >60

## 2011-06-22 LAB — BLOOD GAS, ARTERIAL
Arterial Total CO2: 28.8 mEq/L (ref 24.0–30.0)
Base Excess, Arterial: 3.9 mEq/L — ABNORMAL HIGH (ref <=2.0)
FIO2: 45 %
HCO3, Arterial: 27.6 mEq/L (ref 23.0–29.0)
O2 Sat, Arterial: 99.1 % (ref 95.0–100.0)
Temperature: 35.6
pCO2, Arterial: 37.3 mmHg (ref 35.0–45.0)
pH, Arterial: 7.475 — ABNORMAL HIGH (ref 7.350–7.450)
pO2, Arterial: 94.8 mmHg — ABNORMAL HIGH (ref 80.0–90.0)

## 2011-06-22 LAB — PT/INR
PT INR: 1.3 — ABNORMAL HIGH (ref 0.9–1.1)
PT: 15.8 s — ABNORMAL HIGH (ref 12.6–15.0)

## 2011-06-22 MED ORDER — SODIUM PHOSPHATE 3 MMOLE/ML IV SOLN
35.00 mmol | INTRAVENOUS | Status: DC | PRN
Start: 2011-06-22 — End: 2011-06-23

## 2011-06-22 MED ORDER — HYDROMORPHONE HCL PF 1 MG/ML IJ SOLN
0.50 mg | INTRAMUSCULAR | Status: DC | PRN
Start: 2011-06-22 — End: 2011-06-23

## 2011-06-22 MED ORDER — DEXTROSE 5 % IV SOLN
15.00 mmol | INTRAVENOUS | Status: DC | PRN
Start: 2011-06-22 — End: 2011-06-23

## 2011-06-22 MED ORDER — SODIUM PHOSPHATE 3 MMOLE/ML IV SOLN
25.00 mmol | INTRAVENOUS | Status: DC | PRN
Start: 2011-06-22 — End: 2011-06-23

## 2011-06-22 MED ORDER — CALCIUM GLUCONATE 10 % IV SOLN
1.00 g | INTRAVENOUS | Status: DC | PRN
Start: 2011-06-22 — End: 2011-06-23

## 2011-06-22 MED ORDER — OXYCODONE-ACETAMINOPHEN 5-325 MG PO TABS
1.00 | ORAL_TABLET | ORAL | Status: DC | PRN
Start: 2011-06-22 — End: 2011-06-23

## 2011-06-22 MED ORDER — HEPARIN SODIUM (PORCINE) 5000 UNIT/ML IJ SOLN
5000.00 [IU] | Freq: Three times a day (TID) | INTRAMUSCULAR | Status: DC
Start: 2011-06-22 — End: 2011-06-23
  Administered 2011-06-22 – 2011-06-23 (×4): 5000 [IU] via SUBCUTANEOUS
  Filled 2011-06-22 (×3): qty 1

## 2011-06-22 MED ORDER — MAGNESIUM SULFATE IN D5W 10-5 MG/ML-% IV SOLN
1.00 g | INTRAVENOUS | Status: DC | PRN
Start: 2011-06-22 — End: 2011-06-23

## 2011-06-22 MED ORDER — OXYCODONE-ACETAMINOPHEN 5-325 MG PO TABS
2.00 | ORAL_TABLET | ORAL | Status: DC | PRN
Start: 2011-06-22 — End: 2011-06-23

## 2011-06-22 MED ORDER — POTASSIUM CHLORIDE 10 MEQ/100ML IV SOLN
10.00 meq | INTRAVENOUS | Status: DC | PRN
Start: 2011-06-22 — End: 2011-06-23

## 2011-06-22 MED ORDER — FUROSEMIDE 10 MG/ML IJ SOLN
40.00 mg | Freq: Two times a day (BID) | INTRAMUSCULAR | Status: DC
Start: 2011-06-22 — End: 2011-06-23
  Administered 2011-06-22 – 2011-06-23 (×3): 40 mg via INTRAVENOUS
  Filled 2011-06-22 (×3): qty 4

## 2011-06-22 MED ORDER — POTASSIUM CHLORIDE 20 MEQ PO PACK
20.00 meq | PACK | Freq: Once | ORAL | Status: AC
Start: 2011-06-22 — End: 2011-06-22
  Administered 2011-06-22: 20 meq via ORAL
  Filled 2011-06-22: qty 1

## 2011-06-22 NOTE — Progress Notes (Signed)
Date Time: 06/22/2011 9:13 AM  Patient Name: Brent [name],Brent [name]  Attending Physician: Magda Kiel, MD  Room: FI211/FI211-01   Admit Date: 06/19/2011  LOS: 3 days      A/P  1. Acute Hypoxic Respiratory Failure - Pt on high flow O2 requiring 40L and fio2: 45%. Pt -2.2L overnight, will reduce lasix to 20mg  q12hrs. Goal keep pt (-), follow creatinine and bicarb.  Most recent Abg 7.416/38/90.8. Cxry improving slowly.  Will check abg again today, pt clinically improved today.   Steroids started per d/s w/ pulmonary and thoracic surgery.  Small ptx, follow.  2. S/p VATS for nonspecific interstitial lung disease -  tolerating diet, see #1, CT on suction, mgmt as per thoracic surgery.  Pain control and stool regimen started.  Code Status: Full Code    Prophylaxis:   GI Prophylaxis: protonix  VTE Prophylaxis:  scds and sq heparin    I have personally reviewed the patient's history and 24 hour interval events, along with vitals, labs, radiology images  and additional findings including ventilator settings found in detail within ICU team notes from house staff, NPPs and nursing, with their care plans developed with and reviewed by me.     So far today I have spent 38 minutes providing critical  care for this patient excluding teaching and billable procedures, and not overlapping with any other providers.    S: Last 24hrs:  Pt in better mood, tolerating oral diet      Physical Exam:     Temp:  [97.4 [name] (36.3 C)-99.4 [name] (37.4 C)] 97.6 [name] (36.4 C)  Heart Rate:  [64-92] 66   Resp Rate:  [18] 18   BP: (90-123)/(54-71) 90/59 mmHg  FiO2:  [45 %-97 %] 45 %    Intake and Output Summary (Last 24 hours) at Date Time    Intake/Output Summary (Last 24 hours) at 06/22/11 0913  Last data filed at 06/22/11 0800   Gross per 24 hour   Intake 1958.5 ml   Output   5325 ml   Net -3366.5 ml     Invasive ICU Hemodynamics:          Limited Exam  General: Elderly, feels better  Lungs: improved aeration bilaterally w/ better inspiratory effort, no  wheezing  Cardiac: s1.s2 no m/r/g  Abdomen: soft, no rbd, no guarding  Extremities: able to move all ext, no signt edema, no focal defecit       Medications:   Scheduled Meds:  Current Facility-Administered Medications   Medication Dose Route Frequency   . acetaminophen  1,000 mg Oral Q6H   . atorvastatin  40 mg Oral Daily   . docusate sodium  100 mg Oral BID   . famotidine  20 mg Oral 2XDAY at 0800 & 1200   . furosemide  40 mg Intravenous BID   . heparin (porcine)  5,000 Units Subcutaneous Q8H   . lisinopril  10 mg Oral Daily   . methylPREDNISolone  60 mg Intravenous Q6H SCH   . senna  8.8 mg Oral BID   . DISCONTD: furosemide  40 mg Intravenous QID   . DISCONTD: heparin (porcine)  5,000 Units Subcutaneous Q12H   . DISCONTD: methylPREDNISolone  60 mg Intravenous Q8H       Continuous Infusions:       . DISCONTD: HYDROmorphone Stopped (06/22/11 0758)        PRN Meds:      BMP:  Recent Labs   Basename 06/22/11 0222 06/21/11 0153  06/20/11 0147    NA 138 135* 133*    K 3.9 4.3 4.2    CL 97* 96* 95*    CO2 29 29 25     BUN 26* 27* 21*    CREAT 1.0 0.8 1.1    GLU 149* 109* 106*    CA 8.6 8.4* 8.7    MG 2.0 1.9 1.9    PHOS -- -- --       Estimated Creatinine Clearance: 82 ml/min (based on Cr of 1).    LFTs:  No results found for this basename: AST:3,ALT:3,ALKPHOS:3,BILITOTAL:3,PROT:3,ALB:3 in the last 72 hours    Lactate:  No results found for this basename: LACTATE in the last 72 hours    CBC:  Recent Labs   Basename 06/22/11 0221 06/21/11 0153 06/20/11 0147    WBC 7.53 12.01* 10.10    HGB 11.2* 11.5* 14.4    HCT 32.6* 34.5* 44.0    MCV 88.6 90.8 92.6    PLT 250 202 233       ABG:  Recent Labs   Basename 06/21/11 1720    PHART 7.416    PCO2ART 38.0    PO2ART 90.8*    HCO3ART 24.1    BEART 0.0       Imaging last 24hrs:    Portable AP view performed. The right chest tube has been   removed. Very small pneumothorax has developed. Lungs are hypoinflated,   although improved. Edema has decreased. No detectable pleural effusion.    Heart size is normal. Aorta is unfolded

## 2011-06-22 NOTE — Progress Notes (Signed)
Pt walked one lap around unit.  Pt was on 3L NC at the beginning of the walk with 02 sats in mid 90s.  Lowest 02 sat while walking was 82% and 02 was gradually increased to 10LNC.  Pt denied SOB entire walk.  02 sats were 97% on 3L when returned to his room.

## 2011-06-22 NOTE — Progress Notes (Signed)
ANESTHESIOLOGY ACUTE PAIN SERVICE PROGRESS NOTE    Assessment:   The patient is doing much better and rates his pain as 2/10   He is taking oral meds as needed   His chest tube was removed    Plan:   Will discontinue his pca and start oral analgesics    Pain Rx:  Pca overnight with minimal usage.      All Rx:  Scheduled Meds:  Current Facility-Administered Medications   Medication Dose Route Frequency   . acetaminophen  1,000 mg Oral Q6H   . atorvastatin  40 mg Oral Daily   . docusate sodium  100 mg Oral BID   . famotidine  20 mg Oral 2XDAY at 0800 & 1200   . furosemide  40 mg Intravenous QID   . heparin (porcine)  5,000 Units Subcutaneous Q12H   . lisinopril  10 mg Oral Daily   . methylPREDNISolone  60 mg Intravenous Q6H SCH   . senna  8.8 mg Oral BID   . DISCONTD: methylPREDNISolone  60 mg Intravenous Q8H     Continuous Infusions:     . HYDROmorphone 0.2 mg (06/21/11 0025)     PRN Meds:.albuterol, bisacodyl, diphenhydrAMINE, HYDROmorphone PCA 0.2 mg/mL, ibuprofen, nalbuphine, naloxone, ondansetron, promethazine    Diet:  Cardiac  Used 2.4mg  in 12 hrs    Interval History:  Patient reports mild pain.  Pain Location: incision  Comments:     History:  Brent [name] is a 71 y.o. 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    POD: 3 Days Post-Op    Additional History:  Past Medical History   Diagnosis Date   . DOE (dyspnea on exertion)    . Hypertensive disorder    . Hyperlipidemia    . On home O2 09/2010     @night  2 liters, 88-96%   . Pneumonia    . Clotting disorder      04/2011, PE   . GERD (gastroesophageal reflux disease)    . Cancer      larnygeal cancer, 2004   . Chronic gout      hx    . Arthritis    . Fractures      Past Surgical History   Procedure Date   . Joint replacement      Left TKR, 03/2009   . Tonsillectomy        Allergies:  No Known Allergies    Review of Systems:  NG tube:no    Foley: yes  Voiding: no  Chest tube: no  Side effects: no:      Additional ROS:      Exam:  Filed Vitals:    06/22/11 0600   BP: 90/59   Pulse: 66   Temp:    Resp:        Level of Consciousness: awake and alert   Additional Exam:          Labs:  Recent Labs   Basename 06/22/11 0221    WBC 7.53    HGB 11.2*    HCT 32.6*     Recent Labs   Basename 06/22/11 0221    PT 15.8*    INR 1.3*    PTT --     Recent Labs  Basename 06/22/11 0222    BUN 26*    CREAT 1.0    K 3.9    AST --    ALT --       Level of Service:  1    Signature:  Ardith Dark 06/22/2011 7:39 AM

## 2011-06-22 NOTE — Progress Notes (Signed)
PROGRESS NOTE    Date Time: 06/22/2011 11:13 AM  Patient Name: Brent [name]           Assessment:    1. POD #2 VATS, wedge resection RUL, RLL. Steroids day 1, ? AIP,COP,HSP  2. 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 Hx PE found at [facility] Jan, 2013, on coumadin up until last week- Significant travel Hx prior to event.   4. Hx Laryngeal CA, XRT 2004   5. HTN by hx    Plan:   Continue with solumedrol   Await biopsies   Wean Fio2 for saturation greater or equal to 90      Subjective:   Feels better, chest tube removed, weaning down oxygen    Medications:     Current Facility-Administered Medications   Medication Dose Route Frequency   . acetaminophen  1,000 mg Oral Q6H   . atorvastatin  40 mg Oral Daily   . docusate sodium  100 mg Oral BID   . famotidine  20 mg Oral 2XDAY at 0800 & 1200   . furosemide  40 mg Intravenous BID   . heparin (porcine)  5,000 Units Subcutaneous Q8H   . lisinopril  10 mg Oral Daily   . methylPREDNISolone  60 mg Intravenous Q6H SCH   . senna  8.8 mg Oral BID   . DISCONTD: furosemide  40 mg Intravenous QID   . DISCONTD: heparin (porcine)  5,000 Units Subcutaneous Q12H       Review of Systems:   A comprehensive review of systems was: General ROS: negative  Respiratory ROS: no cough, shortness of breath, or wheezing  Cardiovascular ROS: negative  Musculoskeletal ROS: negative    Physical Exam:     Filed Vitals:    06/22/11 0600   BP: 90/59   Pulse: 66   Temp:    Resp:        Intake and Output Summary (Last 24 hours) at Date Time    Intake/Output Summary (Last 24 hours) at 06/22/11 1113  Last data filed at 06/22/11 0800   Gross per 24 hour   Intake 1678.5 ml   Output   4425 ml   Net -2746.5 ml       Mental status - alert, oriented to person, place, and time  Neck - supple, no significant  adenopathy  Chest - clear to auscultation, no wheezes, rales or rhonchi, symmetric air entry, rales noted bases  Musculoskeletal - no joint tenderness, deformity or swelling    Labs:     Results     Procedure Component Value Units Date/Time    Blood gas, arterial [[id number]]  (Abnormal) Collected:06/22/11 1047    Specimen Information:Blood, Arterial Updated:06/22/11 1056     pH, Arterial 7.475 (H)      pCO2, Arterial 37.3 mmHg      pO2, Arterial 94.8 (H) mmHg      HCO3, Arterial 27.6 mEq/L      Arterial Total CO2 28.8 mEq/L      Base Excess, Arterial 3.9 (H) mEq/L      O2 Sat, Arterial 99.1 %      ABG CollectionSite Right Radl      Temperature 35.6      FIO2 45 %      O2 Delivery  Nasal Cannula     Basic metabolic panel (QAM) [[id number]]  (Abnormal) Collected:06/22/11 0222    Specimen Information:Blood Updated:06/22/11 0331     Glucose 149 (H) mg/dL      BUN 26 (H) mg/dL      Creatinine 1.0 mg/dL      Calcium 8.6 mg/dL      Sodium 409 mEq/L      Potassium 3.9 mEq/L      Chloride 97 (L) mEq/L      CO2 29 mEq/L     Magnesium (QAM) [[id number]] Collected:06/22/11 0222    Specimen Information:Blood Updated:06/22/11 0331     Magnesium 2.0 mg/dL     GFR [[id number]] VHQIONGEX:52/84/13 0222     EGFR >60.0   Updated:06/22/11 0331    Protime-INR (QAM If patient is on Warfarin) [[id number]]  (Abnormal) Collected:06/22/11 0221    Specimen Information:Blood Updated:06/22/11 0314     Prothrombin Time 15.8 (H) sec      PT INR 1.3 (H)      PT Anticoag. Given Within 48 hrs. None     CBC (QAM) [[id number]]  (Abnormal) Collected:06/22/11 0221    Specimen Information:Blood Updated:06/22/11 0306     WBC 7.53 x10 3/uL      RBC 3.68 (L) x10 6/uL      Hgb 11.2 (L) g/dL      Hematocrit 74.2 (L) %      MCV 88.6 fL      MCH 30.4 pg      MCHC 34.4 g/dL      RDW 15 %      Platelets 250 x10 3/uL      MPV 9.7 fL      Nucleated RBC 0 /100 WBC     Blood gas, arterial [[id number]]  (Abnormal) Collected:06/21/11 1720    Specimen Information:Blood,  Arterial Updated:06/21/11 1738     pH, Arterial 7.416      pCO2, Arterial 38.0 mmHg      pO2, Arterial 90.8 (H) mmHg      HCO3, Arterial 24.1 mEq/L      Arterial Total CO2 25.3 mEq/L      Base Excess, Arterial 0.0 mEq/L      O2 Sat, Arterial 97.8 %      ABG CollectionSite Right Radl      Temperature 36.3      FIO2 60 %     Anaerobic culture [[id number]] Collected:06/19/11 0900    Specimen Information:Other / Lung Updated:06/21/11 1255    Narrative:    ORDER#: [id number]                                    ORDERED BY: FORTES, DANIEL  SOURCE: Lung Right Lower Lobe                        COLLECTED:  06/19/11 09:00  ANTIBIOTICS AT COLL.:                                RECEIVED :  06/19/11 13:17  Culture Anaerobic                          FINAL       06/21/11 12:55  06/21/11   No anaerobic growth      Anaerobic culture [[id number]] Collected:06/19/11 0820  Specimen Information:Other / Lung Updated:06/21/11 1255    Narrative:    ORDER#: [id number]                                    ORDERED BY: FORTES, DANIEL  SOURCE: Lung Right Upper Lobe                        COLLECTED:  06/19/11 08:20  ANTIBIOTICS AT COLL.:                                RECEIVED :  06/19/11 13:32  Culture Anaerobic                          FINAL       06/21/11 12:55  06/21/11   No anaerobic growth No anaerobic growth            Recent CBC   Recent Labs   Basename 06/22/11 0221    RBC 3.68*    HGB 11.2*    HCT 32.6*    MCV 88.6    MCH 30.4    MCHC 34.4    RDW 15    MPV 9.7    LABPLAT --     Recent BMP   Recent Labs   Basename 06/22/11 0222    GLU 149*    BUN 26*    CREAT 1.0    CA 8.6    NA 138    K 3.9    CL 97*    CO2 29       Rads:   Radiological Procedure reviewed. PCR significant clearing of bilateral infiltrates    Signed by: Doroteo Bradford

## 2011-06-22 NOTE — Progress Notes (Signed)
A/P: 35 yoM POD#3 s/p R VATS and wedge biopsies x2 - sleeping comfortably on HFNC, weaned to FiOw 45%, saturating in mid 90's. Chest tube taken out yesterday, no issues overnight. Cultures NGTD, path pending. Tolerating diet. Afebrile, hemodynamically stable. Started steroids yesterday.    - wean O2 as tolerated  - aggressive pulm toilet  - cont steroids  - pain control per Pain team    Celesta Gentile R4, Thoracic, Spectra [phone]

## 2011-06-23 ENCOUNTER — Inpatient Hospital Stay: Payer: Medicare Other

## 2011-06-23 ENCOUNTER — Encounter: Payer: Self-pay | Admitting: Critical Care Medicine

## 2011-06-23 DIAGNOSIS — Z86711 Personal history of pulmonary embolism: Secondary | ICD-10-CM

## 2011-06-23 DIAGNOSIS — E785 Hyperlipidemia, unspecified: Secondary | ICD-10-CM | POA: Diagnosis present

## 2011-06-23 DIAGNOSIS — I10 Essential (primary) hypertension: Secondary | ICD-10-CM | POA: Diagnosis present

## 2011-06-23 DIAGNOSIS — R0600 Dyspnea, unspecified: Secondary | ICD-10-CM | POA: Diagnosis present

## 2011-06-23 DIAGNOSIS — Z9981 Dependence on supplemental oxygen: Secondary | ICD-10-CM

## 2011-06-23 DIAGNOSIS — K219 Gastro-esophageal reflux disease without esophagitis: Secondary | ICD-10-CM | POA: Diagnosis present

## 2011-06-23 DIAGNOSIS — J8410 Pulmonary fibrosis, unspecified: Secondary | ICD-10-CM

## 2011-06-23 LAB — BASIC METABOLIC PANEL
BUN: 33 mg/dL — ABNORMAL HIGH (ref 8–20)
CO2: 31 mEq/L — ABNORMAL HIGH (ref 21–30)
Calcium: 8.7 mg/dL (ref 8.6–10.2)
Chloride: 98 mEq/L (ref 98–107)
Creatinine: 0.9 mg/dL (ref 0.6–1.5)
Glucose: 144 mg/dL — ABNORMAL HIGH (ref 70–100)
Potassium: 3.7 mEq/L (ref 3.6–5.0)
Sodium: 138 mEq/L (ref 136–146)

## 2011-06-23 LAB — GFR: EGFR: >60

## 2011-06-23 MED ORDER — PREDNISONE 20 MG PO TABS
60.00 mg | ORAL_TABLET | Freq: Every day | ORAL | Status: AC
Start: 2011-06-23 — End: 2011-07-03

## 2011-06-23 MED ORDER — POTASSIUM CHLORIDE CRYS ER 20 MEQ PO TBCR
40.00 meq | EXTENDED_RELEASE_TABLET | Freq: Once | ORAL | Status: AC
Start: 2011-06-23 — End: 2011-06-23
  Administered 2011-06-23: 40 meq via ORAL
  Filled 2011-06-23: qty 2

## 2011-06-23 MED ORDER — OXYCODONE-ACETAMINOPHEN 5-325 MG PO TABS
1.00 | ORAL_TABLET | ORAL | Status: AC | PRN
Start: 2011-06-23 — End: 2011-07-03

## 2011-06-23 NOTE — Discharge Summary (Signed)
Discharge Summary    Admit Date: 06/19/2011  Discharge Date: 06/23/2011    Attending: Magda Kiel, MD   Service: Thoracic Surgery    Consults: [facility], Pulmonology Loyal Gambler)   Procedures: Right VATS, lung biopsy    Admission Diagnoses:   Patient Active Problem List   Diagnoses Date Noted   . Dyspnea on exertion 06/23/2011   . HTN (hypertension) 06/23/2011   . Hyperlipidemia 06/23/2011   . On home oxygen therapy 06/23/2011   . History of pulmonary embolism 06/23/2011   . GERD (gastroesophageal reflux disease) 06/23/2011     Discharge Diagnoses:   Patient Active Problem List   Diagnoses Date Noted   . Dyspnea on exertion 06/23/2011   . HTN (hypertension) 06/23/2011   . Hyperlipidemia 06/23/2011   . On home oxygen therapy 06/23/2011   . History of pulmonary embolism 06/23/2011   . GERD (gastroesophageal reflux disease) 06/23/2011       HPI:   Mr. Robart is a 71 year old gentleman with an 35-month history of intermittent episodes of fevers, cough, shortness of breath, and desaturations. He is noted to have in weaning and pulmonary infiltrates. He is taken to the operating room today for a lung biopsy for potential diagnosis.    Hospital Course:   The patient was taken to the operating room and underwent the above procedure without complication. He had one of his episodes of shortness of breath and desaturation the night before surgery and had increased oxygen requirements post-operatively, necessitating admission to the ICU and use of oxymizer and bipap. Both [facility] and his pulmonology group were consulted. Over the next days, he was transitioned to high-flow nasal cannula and diuresed. His chest tube was removed on post-operative day 2 without complication and steroids were started. On post-operative day 3, he was transitioned to standard nasal cannula and ambulated. He remained stable on 3L nasal cannula and was deemed stable for discharge by the critical cares service, pulmonology, and thoracic surgery on  post-operative day 4. He will be discharged home on home oxygen as needed and on 60mg  of prednisone daily, per pulmonology recommendations. He will follow up with pulmonology this week.    Condition: stable    Disposition: home with family    Discharge Instructions:  Activity: no activity restrictions, ambulate often, continue using IS (10x/hr), acapella (30x/hr)  Diet: regular diet  Wound Care: may remove chest tube dressing 48hrs after tube removal, may shower (dressing waterproof)  Bathing:  may shower, do not soak in a bath tub or go swimming for 2 weeks.     Call your doctor or go to emergency room if you have any of the following:   Fever greater than 101.5 F   Pus draining from your wound   Redness around your wound that is spreading   Pain not controlled with medications   Chest pain   Shortness of breath   Nausea/vomiting     Discharge Medications:    Leotis, Isham   Home Medication Instructions [id number]    Printed on:06/23/11 0829   Medication Information                      pantoprazole (PROTONIX) 40 MG tablet  Take 40 mg by mouth daily.             atorvastatin (LIPITOR) 40 MG tablet  Take 40 mg by mouth daily.             lisinopril (PRINIVIL,ZESTRIL) 10 MG tablet  Take 10 mg by mouth daily.             warfarin (COUMADIN) 10 MG tablet  Take 8 mg by mouth daily. Stopped 06/11/11             oxyCODONE-acetaminophen (PERCOCET) 5-325 MG per tablet  Take 1-2 tablets by mouth every 4 (four) hours as needed.             predniSONE (DELTASONE) 20 MG tablet  Take 3 tablets (60 mg total) by mouth daily.                     Follow-up With Details Comments Contact Info   LORUSSO, THOMAS J Schedule an appointment as soon as possible for a visit this week [address]  [address] [address]  [phone]     FORTES, DANIEL L Schedule an appointment as soon as possible for a visit in 4 weeks (post-operative visit with thoracic surgery) [address]  [address] [place]  [address]  [phone]           Janine Limbo. Betsey Holiday, MD  Resident, General Surgery  [phone]

## 2011-06-23 NOTE — Plan of Care (Signed)
Pt discharged to home with home oxygenator which wife brought. Pt remained on 1-2 l NC with sat's >95%. Pt compliant with doing accapella and IS q 1-2 hours. No complaints of pain. Diuresed 1.5l with lasix 40mg IV this morning. Went over discharge instructions, and given percocet and prednisone script orders. This writer brought pt and bag of belongings by wheelchair to parking garage and wife pulled up.

## 2011-06-23 NOTE — Discharge Instructions (Addendum)
Discharge Instructions for Video Assisted Thoracosopic Surgery (VATS)     Follow-Up Please call the office for a follow up appointment in 4 weeks    Home Care   If your dressings were not removed at the hospital please remove them the day after discharge and leave them off   Your dressings are waterproof; you may get them wet in the shower   Shower daily. Wash your incision gently with mild soap and warm water. Pat dry and leave the incision open to air   Underneath the dressing there may be a small white strip over your incision. Leave this in place for 7-10 days after surgery   Do not take a bath or swim until cleared by your surgeon   Care of the Chest tube Site   It is normal to notice some oozing of fluid from the chest tube site (especially with coughing)   Wear an old T-Shirt to keep your clothes from becoming soiled  Use your incentive spirometer (blue) 10 times/hour and your Acapella (green) 30 times/hour for the first 2 weeks at home   Return to your normal diet as you feel able. Eat a healthy, well-balanced diet.   Avoid constipation   Narcotic pain medications (Dilaudid, Percocet, Vicodin) can cause constipation   Drink a variety of fluids and eat a high fiber diet to help avoid becoming constipated   You may use any over the counter laxative of your choice such as Miralax, Senokot, Dulcolax, fleets enema etc. if you become constipated    Medications   You will be given instructions with your discharge paperwork regarding which medications you are to resume and which medications should be stopped   Pain Medication  We recommend the following pain medication regimen:   Ibuprofen (Advil, Motrin) 200 mg - 2 tablets every 6 hours as needed for mild/moderate pain   Percocet (oxycodone-acetominiphen) 5/325 mg - 1 tablet every 4 hours as needed for severe pain.   May take another percocet tablet for a total of 10/650mg  every 4 hours for breakthrough severe pain.  A sample schedule which works well for most  patients is:   Ibuprofen with Breakfast, Lunch, Dinner   Tylenol (acetaminophen) in-between meals (remember, do not exceed 4000mg  daily--acetaminophen is also in your percocet)  Percocet for severe pain not alleviated by above    Activity   There are no restrictions on your activity   Don't worry if you are fatigued. Fatigue and weakness normally last a few weeks after surgery   You will gain strength as you recover completely   Walking is the best exercise. Walk every day and be active!!    Driving   Do not drive for 24 hours after taking any narcotic pain medications     Miscellaneous   Due to the breathing tube used during your operation and the nature of lung surgery it is common to cough up blood tinged mucus or even small blood clots   This is normal and not cause for alarm   If however, you cough up large amounts of blood (larger than a tablespoon) then you need to notify your surgeon or the surgeon on call if it is after hours  Notify your surgeon at [phone] if you experience any of the following:   Fever greater than 101.0   Sudden shortness of breath   Significant redness, swelling, tenderness of your surgical incisions

## 2011-06-23 NOTE — Progress Notes (Addendum)
Date Time: 06/23/2011 8:04 AM  Patient Name: Brent [name] F  Attending Physician: Magda Kiel, MD  Room: FI211/FI211-01   Admit Date: 06/19/2011  LOS: 4 days      A/P  1. Acute Interstitial lung disease - He is now on nasal cannula, will hold further diuresis as BP marginal, cxry infiltrates have resolved. Pulmonary to decide on steroid taper, pt can be switched to oral, will defer taper to them.  S/p VATS, transfer to floor, evaluate need for home oxygen. PT/OT to see pt  Code Status: Full Code    Prophylaxis:   GI Prophylaxis: protonix  VTE Prophylaxis:  scds and sq heparin    I have personally reviewed the patient's history and 24 hour interval events, along with vitals, labs, radiology images  and additional findings including ventilator settings found in detail within ICU team notes from house staff, NPPs and nursing, with their care plans developed with and reviewed by me.       S: Last 24hrs:  Pt would like to go home    Physical Exam:     Temp:  [95.7 F (35.4 C)-97.6 F (36.4 C)] 97.6 F (36.4 C)  Heart Rate:  [52-90] 52   Resp Rate:  [16-20] 18   BP: (83-121)/(38-74) 83/41 mmHg  FiO2:  [45 %] 45 %    Intake and Output Summary (Last 24 hours) at Date Time    Intake/Output Summary (Last 24 hours) at 06/23/11 0804  Last data filed at 06/23/11 0600   Gross per 24 hour   Intake   1310 ml   Output   3202 ml   Net  -1892 ml     Invasive ICU Hemodynamics:          Limited Exam  General: Elderly, feels better  Lungs: no crackles, no wheezing, CTA anteriorly and posteriorly  Cardiac: s1.s2 no m/r/g  Abdomen: soft, no rbd, no guarding  Extremities: able to move all ext, no signt edema, no focal defecit       Medications:   Scheduled Meds:  Current Facility-Administered Medications   Medication Dose Route Frequency   . acetaminophen  1,000 mg Oral Q6H   . atorvastatin  40 mg Oral Daily   . docusate sodium  100 mg Oral BID   . famotidine  20 mg Oral 2XDAY at 0800 & 1200   . furosemide  40 mg Intravenous BID    . heparin (porcine)  5,000 Units Subcutaneous Q8H   . lisinopril  10 mg Oral Daily   . methylPREDNISolone  60 mg Intravenous Q6H SCH   . potassium chloride  20 mEq Oral Once   . senna  8.8 mg Oral BID   . DISCONTD: furosemide  40 mg Intravenous QID   . DISCONTD: heparin (porcine)  5,000 Units Subcutaneous Q12H       Continuous Infusions:        PRN Meds:      BMP:  Recent Labs   Basename 06/23/11 0237 06/22/11 1509 06/22/11 0222 06/21/11 0153    NA 138 139 138 --    K 3.7 3.8 3.9 --    CL 98 97* 97* --    CO2 31* 30 29 --    BUN 33* 29* 26* --    CREAT 0.9 1.0 1.0 --    GLU 144* 163* 149* --    CA 8.7 8.8 8.6 --    MG -- 2.1 2.0 1.9    PHOS -- -- -- --  Estimated Creatinine Clearance: 82 ml/min (based on Cr of 0.9).    LFTs:  No results found for this basename: AST:3,ALT:3,ALKPHOS:3,BILITOTAL:3,PROT:3,ALB:3 in the last 72 hours    Lactate:  No results found for this basename: LACTATE in the last 72 hours    CBC:  Recent Labs   Basename 06/22/11 0221 06/21/11 0153    WBC 7.53 12.01*    HGB 11.2* 11.5*    HCT 32.6* 34.5*    MCV 88.6 90.8    PLT 250 202       ABG:  Recent Labs   Basename 06/22/11 1047    PHART 7.475*    PCO2ART 37.3    PO2ART 94.8*    HCO3ART 27.6    BEART 3.9*       Imaging last 24hrs:  Unable to visualize PTX, await radiology read.  Cxry without other gross abn

## 2011-06-23 NOTE — Progress Notes (Addendum)
A/P: 43 yoM POD#3 s/p R VATS and wedge biopsies x2 - sleeping comfortably on 3L NC, saturating in mid 90's. Oxygen weaned from high flow NC at 35L to nasal cannula 3L yesterday, though pt needed adjustments to 10L while walking around unit secondary to desats to 80s. Chest tube removed 2 days ago, site c/d/i. Steroids started two days ago. Cultures NGTD, path pending. Afebrile, hemodynamically stable, tolerating diet.   - wean O2 as tolerated  - aggressive pulm toilet  - cont steroids  - pain control per Pain team    Janine Limbo. Betsey Holiday, MD  Resident, General Surgery  [phone]    Significantly improved clinically.  CXR improved.  Minimal O2 use.  D/W Dr. Loyal Gambler.  OK with d/c home with oral prednisone 60mg  per day and f/u later this week.  Dr. Randolm Idol agrees.    Madilyn Hook, M.D.  Thoracic Surgeon  [phone]

## 2011-06-23 NOTE — Plan of Care (Signed)
SEE DISCHARGE PREVIOUS NOTE

## 2011-06-23 NOTE — Progress Notes (Signed)
Pt discharged to home with home oxygenator which wife brought. Pt remained on 1-2 l NC with sat's >95%. Pt compliant with doing accapella and IS q 1-2 hours. No complaints of pain. Diuresed 1.5l with lasix 40mg  IV this morning. Went over discharge instructions, and given percocet and prednisone script orders. This Clinical research associate brought pt and bag of belongings by wheelchair to parking garage and wife pulled up.

## 2011-06-25 NOTE — Anesthesia Postprocedure Evaluation (Signed)
The patient is awake or easily arousable.      The patients respirations, and cardiovascular status have been evaluated and deemed stable post op.     Post op nausea, vomiting and pain have been treated and controlled as effectively as possible without compromising the patients respiratory and cardiovascular status.    Please refer to Post Op PACU Documentation for confirmation of attainment of normothermia and adequate hydration status.    There were no obvious anesthetic related complications.    The patient has recovered adequately to be transfered to floor at the present time.

## 2011-07-15 NOTE — Addendum Note (Signed)
Addendum  created 07/15/11 1310 by Hessie Diener, MD    Modules edited:Anesthesia Attestations, Anesthesia Responsible Staff

## 2011-12-25 LAB — ECG 12-LEAD
Atrial Rate: 68 {beats}/min
P Axis: 28 degrees
P-R Interval: 152 ms
Q-T Interval: 414 ms
QRS Duration: 84 ms
QTC Calculation (Bezet): 440 ms
R Axis: 8 degrees
T Axis: 71 degrees
Ventricular Rate: 68 {beats}/min

## 2012-01-15 ENCOUNTER — Other Ambulatory Visit
Admission: RE | Admit: 2012-01-15 | Discharge: 2012-01-15 | Disposition: A | Discharge status: Home / Self Care | Payer: Medicare Other | Source: Ambulatory Visit | Attending: Gastroenterology | Admitting: Gastroenterology

## 2012-01-15 DIAGNOSIS — Z1211 Encounter for screening for malignant neoplasm of colon: Secondary | ICD-10-CM | POA: Insufficient documentation

## 2012-03-23 NOTE — Op Note (Unsigned)
DATE OF BIRTH:                        08-31-1940      ADMISSION DATE:                     08/09/2002            PATIENT LOCATION:                     [id number]            DATE OF PROCEDURE:                   08/09/2002      SURGEON:                            Veneta Penton, MD      ASSISTANT(S):                  PREOPERATIVE DIAGNOSIS:  ANTERIOR GLOTTIC LESION.            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            PROCEDURE:  MICROLARYNGOSCOPY WITH BIOPSY AND DEBULKING OF LEFT ANTERIOR      TRUE VOCAL CORD TUMOR.            ANESTHESIA:  General endotracheal.            ESTIMATED BLOOD LOSS:  Minimal.            COMPLICATIONS:  None.            SPECIMENS:  Multiple biopsies from the left true vocal cord anteriorly.            FINDINGS:  The patient had a bulky lesion of the anterior glottis with no      apparent impairment of vocal cord mobility.  At the time of      microlaryngoscopy, this lesion involved the anterior third of the left true      vocal cord and is both on its superior and inferior surfaces but does not      cross the midline and does not extend into the false vocal fold or to the      vocal process of the left true vocal cord.  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            DESCRIPTION OF PROCEDURE:  The patient was taken to the operating room and      placed in the supine position.  General endotracheal anesthesia was      established and then the patient's head was positioned in the sniffing      position as well as some mild extension.  The upper teeth were protected      with a rubber guard and then a Dedo laryngoscope was introduced.  I was      only able to see the posterior half of the glottis using this instrument so      this was taken out and a Holinger  anterior commissure scope was inserted.      Suspension was established  being careful to avoid injury to the dentition.      The anterior glottic region was then photographed and topical Adrenalin was      applied on cotton pledgets.  After an appropriate interval, biopsies were      taken of the anterior left true vocal cord lesion which was quite bulky but      was not difficult to debulk with minimal subsequent bleeding.  Once the      mass of the lesion was removed, I then carefully inspected the mucosal      margins to see what the extent of disease was.  Two percent topical      lidocaine was then applied onto the glottis and the laryngoscope was      removed.            Once the pathologist returned the diagnosis of adequate tissue for      analysis, the patient was awakened, extubated and taken to the recovery      room with no airway difficulties and in stable condition.                                    ___________________________________     Date Signed: __________      Veneta Penton, MD  ([id number])            D: 08/09/2002 by Veneta Penton, MD      T: 08/10/2002 by [id number] ([id number]) ([id number])      cc:  Veneta Penton, MD

## 2012-03-31 HISTORY — PX: HERNIA REPAIR: SHX51

## 2012-06-25 ENCOUNTER — Emergency Department: Payer: Medicare Other

## 2012-06-25 ENCOUNTER — Emergency Department
Admission: EM | Admit: 2012-06-25 | Discharge: 2012-06-25 | Disposition: A | Discharge status: Home / Self Care | Payer: Medicare Other | Attending: Emergency Medicine | Admitting: Emergency Medicine

## 2012-06-25 DIAGNOSIS — E785 Hyperlipidemia, unspecified: Secondary | ICD-10-CM | POA: Insufficient documentation

## 2012-06-25 DIAGNOSIS — K219 Gastro-esophageal reflux disease without esophagitis: Secondary | ICD-10-CM | POA: Insufficient documentation

## 2012-06-25 DIAGNOSIS — Z8521 Personal history of malignant neoplasm of larynx: Secondary | ICD-10-CM | POA: Insufficient documentation

## 2012-06-25 DIAGNOSIS — R109 Unspecified abdominal pain: Secondary | ICD-10-CM

## 2012-06-25 DIAGNOSIS — I10 Essential (primary) hypertension: Secondary | ICD-10-CM | POA: Insufficient documentation

## 2012-06-25 DIAGNOSIS — K4090 Unilateral inguinal hernia, without obstruction or gangrene, not specified as recurrent: Secondary | ICD-10-CM | POA: Insufficient documentation

## 2012-06-25 DIAGNOSIS — Z96659 Presence of unspecified artificial knee joint: Secondary | ICD-10-CM | POA: Insufficient documentation

## 2012-06-25 LAB — CBC AND DIFFERENTIAL
Basophils Absolute Automated: 0.02 10*3/uL (ref 0.00–0.20)
Basophils Automated: 0 %
Eosinophils Absolute Automated: 0.07 10*3/uL (ref 0.00–0.70)
Eosinophils Automated: 2 %
Hematocrit: 37.4 % — ABNORMAL LOW (ref 42.0–52.0)
Hgb: 12.5 g/dL — ABNORMAL LOW (ref 13.0–17.0)
Immature Granulocytes Absolute: 0 10*3/uL
Immature Granulocytes: 0 %
Lymphocytes Absolute Automated: 1.25 10*3/uL (ref 0.50–4.40)
Lymphocytes Automated: 26 %
MCH: 31.5 pg (ref 28.0–32.0)
MCHC: 33.4 g/dL (ref 32.0–36.0)
MCV: 94.2 fL (ref 80.0–100.0)
MPV: 9.8 fL (ref 9.4–12.3)
Monocytes Absolute Automated: 0.15 10*3/uL (ref 0.00–1.20)
Monocytes: 3 %
Neutrophils Absolute: 3.25 10*3/uL (ref 1.80–8.10)
Neutrophils: 68 %
Platelets: 123 10*3/uL — ABNORMAL LOW (ref 140–400)
RBC: 3.97 10*6/uL — ABNORMAL LOW (ref 4.70–6.00)
RDW: 14 % (ref 12–15)
WBC: 4.74 10*3/uL (ref 3.50–10.80)

## 2012-06-25 LAB — COMPREHENSIVE METABOLIC PANEL
ALT: 17 U/L (ref 0–55)
AST (SGOT): 16 U/L (ref 5–34)
Albumin/Globulin Ratio: 1.3 (ref 0.9–2.2)
Albumin: 4.2 g/dL (ref 3.5–5.0)
Alkaline Phosphatase: 62 U/L (ref 40–150)
Anion Gap: 13 (ref 5.0–15.0)
BUN: 32.4 mg/dL — ABNORMAL HIGH (ref 9.0–21.0)
Bilirubin, Total: 0.6 mg/dL (ref 0.2–1.2)
CO2: 19 mEq/L — ABNORMAL LOW (ref 22–29)
Calcium: 10 mg/dL (ref 8.5–10.5)
Chloride: 108 mEq/L — ABNORMAL HIGH (ref 98–107)
Creatinine: 1.5 mg/dL — ABNORMAL HIGH (ref 0.7–1.3)
Globulin: 3.2 g/dL (ref 2.0–3.6)
Glucose: 146 mg/dL — ABNORMAL HIGH (ref 70–100)
Potassium: 4.4 mEq/L (ref 3.5–5.1)
Protein, Total: 7.4 g/dL (ref 6.0–8.3)
Sodium: 140 mEq/L (ref 136–145)

## 2012-06-25 LAB — LIPASE: Lipase: 34 U/L (ref 8–78)

## 2012-06-25 LAB — GFR: EGFR: 46.4

## 2012-06-25 MED ORDER — HYDROMORPHONE HCL PF 1 MG/ML IJ SOLN
1.0000 mg | Freq: Once | INTRAMUSCULAR | Status: AC
Start: 2012-06-25 — End: 2012-06-25
  Administered 2012-06-25: 1 mg via INTRAVENOUS
  Filled 2012-06-25: qty 1

## 2012-06-25 MED ORDER — DIAZEPAM 5 MG PO TABS
5.0000 mg | ORAL_TABLET | Freq: Four times a day (QID) | ORAL | Status: AC | PRN
Start: 2012-06-25 — End: 2012-07-05

## 2012-06-25 MED ORDER — DIAZEPAM 5 MG/ML IJ SOLN
5.0000 mg | Freq: Once | INTRAMUSCULAR | Status: AC
Start: 2012-06-25 — End: 2012-06-25
  Administered 2012-06-25: 5 mg via INTRAVENOUS
  Filled 2012-06-25: qty 2

## 2012-06-25 MED ORDER — ONDANSETRON HCL 4 MG/2ML IJ SOLN
4.0000 mg | Freq: Once | INTRAMUSCULAR | Status: AC
Start: 2012-06-25 — End: 2012-06-25
  Administered 2012-06-25: 4 mg via INTRAVENOUS
  Filled 2012-06-25: qty 2

## 2012-06-25 MED ORDER — SODIUM CHLORIDE 0.9 % IV BOLUS
1000.00 mL | Freq: Once | INTRAVENOUS | Status: AC
Start: 2012-06-25 — End: 2012-06-25
  Administered 2012-06-25: 1000 mL via INTRAVENOUS

## 2012-06-25 MED ORDER — ONDANSETRON 4 MG PO TBDP
4.00 mg | ORAL_TABLET | Freq: Four times a day (QID) | ORAL | Status: AC | PRN
Start: 2012-06-25 — End: 2012-07-02

## 2012-06-25 MED ORDER — OXYCODONE-ACETAMINOPHEN 5-325 MG PO TABS
2.0000 | ORAL_TABLET | ORAL | Status: AC | PRN
Start: 2012-06-25 — End: 2012-07-05

## 2012-06-25 NOTE — ED Notes (Signed)
Pt placed in trendelenburg, Ice pack and 3 lb weight applied to lower right quadrant

## 2012-06-25 NOTE — ED Notes (Signed)
Ice pack and 3 lb weights removed

## 2012-06-25 NOTE — ED Notes (Signed)
Pt c/o severe abdominal pain that started at 0800. Pt a normal bowel movement and is dry heaving upon arrival. Pt has a bulge in lower right abdomin and has a history of hernia repair.  Pt had coffee at 0730 no solid foods. Pt ate at 2030 last night

## 2012-06-25 NOTE — Discharge Instructions (Signed)
Hernia [Adult]    A hernia is a bulge of the intestines or surrounding tissues through a tear in the muscle of the abdomen or groin. This may occur as a result of excessive coughing, heavy lifting or being overweight. It can also occur at the site of prior surgery. When a hernia first appears it may be painful due to stretching and tearing of the muscle fibers. When you lie down, the bulge should reduce in size or disappear completely. If it does not, and you are unable to flatten it with your hand, medical attention is needed at once.  Home Care:  Avoid heavy lifting and straining or any activities that cause pain in the hernia.  Follow Up  with your physician as directed by our staff.  Get Prompt Medical Attention  if any of the following occur:   Increasing size of the hernia   Increasing pain in the hernia   A hernia that does not get smaller when you lie down   Hardening of the hernia   Abdominal swelling, fever or repeated vomiting   Pain moves to the lower right abdomen (just below the waistline) or spreads to the back   2000-2013 [employer], [address], [place] [address]. All rights reserved. This information is not intended as a substitute for professional medical care. Always follow your healthcare professional's instructions.

## 2012-06-25 NOTE — ED Provider Notes (Signed)
Physician/Midlevel provider first contact with patient: 06/25/12 0900         History     Chief Complaint   Patient presents with   . Abdominal Pain     HPI Comments: Pt here with R inguinal pain that began during a breakfast meeting.  Thought he needed to have a BM and strained twice and pain worsened.  Remote hx of R inguinal hernia repair with mesh 27 years ago.  Mild N no V, F, urinary c/o    Patient is a 72 y.o. male presenting with abdominal pain. The history is provided by the patient and the spouse. No language interpreter was used.   Abdominal Pain  The primary symptoms of the illness include abdominal pain. The primary symptoms of the illness do not include fever, fatigue, shortness of breath, nausea, vomiting, diarrhea or dysuria. The current episode started 1 to 2 hours ago. The onset of the illness was sudden. The problem has not changed since onset.  Associated with: straining. The patient has had a change in bowel habit. Risk factors for an acute abdominal problem include being elderly. Additional symptoms associated with the illness include constipation. Symptoms associated with the illness do not include chills, anorexia, diaphoresis, frequency or back pain. Significant associated medical issues do not include PUD or GERD.       Past Medical History   Diagnosis Date   . DOE (dyspnea on exertion)    . Hypertensive disorder    . Hyperlipidemia    . On home O2 09/2010     @night  2 liters, 88-96%   . Pneumonia    . Clotting disorder      04/2011, PE   . GERD (gastroesophageal reflux disease)    . Cancer      larnygeal cancer, 2004   . Chronic gout      hx   . Arthritis    . Fractures    . Interstitial lung disease        Past Surgical History   Procedure Date   . Joint replacement      Left TKR, 03/2009   . Tonsillectomy    . Hernia repair        History reviewed. No pertinent family history.    Social  History   Substance Use Topics   . Smoking status: Never Smoker    . Smokeless tobacco: Not on file   .  Alcohol Use: No      Comment: pt states he quit 3 weeks ago       .     No Known Allergies    Current/Home Medications    ATORVASTATIN (LIPITOR) 40 MG TABLET    Take 40 mg by mouth daily.    LISINOPRIL (PRINIVIL,ZESTRIL) 10 MG TABLET    Take 10 mg by mouth daily.    PANTOPRAZOLE (PROTONIX) 40 MG TABLET    Take 40 mg by mouth daily.        Review of Systems   Constitutional: Negative for fever, chills, diaphoresis and fatigue.   HENT: Negative for ear pain, congestion, sore throat and neck pain.    Eyes: Negative for pain and redness.   Respiratory: Negative for cough and shortness of breath.    Cardiovascular: Negative for chest pain and palpitations.   Gastrointestinal: Positive for abdominal pain and constipation. Negative for nausea, vomiting, diarrhea, abdominal distention, anal bleeding and anorexia.   Genitourinary: Negative for dysuria and frequency.   Musculoskeletal: Negative for myalgias  and back pain.   Skin: Negative for rash.   Neurological: Negative for dizziness and headaches.   Psychiatric/Behavioral: Negative for suicidal ideas and hallucinations.   All other systems reviewed and are negative.        Physical Exam    BP 116/62  Pulse 60  Temp 97.1 F (36.2 C) (Temporal Artery)  Resp 16  Ht 1.727 m  Wt 102.059 kg  BMI 34.22 kg/m2  SpO2 99%    Physical Exam   Constitutional: He is oriented to person, place, and time. He appears well-developed and well-nourished.   HENT:   Head: Normocephalic and atraumatic.   Eyes: Pupils are equal, round, and reactive to light.   Neck: Normal range of motion. Neck supple.   Cardiovascular: Normal rate and regular rhythm.    Pulmonary/Chest: Effort normal and breath sounds normal.   Abdominal: Soft. Bowel sounds are normal. He exhibits no shifting dullness and no pulsatile liver. There is tenderness in the right lower quadrant. There is no rebound, no CVA tenderness, no tenderness at McBurney's point and negative Murphy's sign. A hernia is present.             R inguinal bulge ttp, minimal RLQ tenderness   Musculoskeletal: Normal range of motion.   Neurological: He is alert and oriented to person, place, and time.   Skin: Skin is warm and dry.   Psychiatric: He has a normal mood and affect. His behavior is normal.       MDM and ED Course     ED Medication Orders      Start     Status Ordering Provider    06/25/12 0920   ondansetron (ZOFRAN) injection 4 mg   Once      Route: Intravenous  Ordered Dose: 4 mg         Last MAR action:  Given Louis Matte [facility]    06/25/12 0920   HYDROmorphone (DILAUDID) injection 1 mg   Once      Route: Intravenous  Ordered Dose: 1 mg         Last MAR action:  Given Louis Matte GITANJALI    06/25/12 0920   diazepam (VALIUM) injection 5 mg   Once      Route: Intravenous  Ordered Dose: 5 mg         Last MAR action:  Given Louis Matte [facility]    06/25/12 0909   sodium chloride 0.9 % bolus 1,000 mL   Once      Route: Intravenous  Ordered Dose: 1,000 mL         Last MAR action:  Stopped Bard Haupert GITANJALI                 MDM  Number of Diagnoses or Management Options  Reducible inguinal hernia:   Diagnosis management comments: ILouis Matte, MD, have been the primary provider for Brent [name] during this Emergency Dept visit.    Due to reasons beyond my control in the EPIC system,  timed orders including medications, IV fluids and drips show up as posted 30-45 minutes AFTER they were entered by me.  Nurses know and are notified verbally to give medications STAT in the ED.    Oxygen saturation by pulse oximetry is 95%-100%, Normal.  Interventions: None Needed    DDX:  R inguinal Hernia, doubt appendicitis, doubt renal stones  Plan:  Supportive measures to reduce hernia, pain meds, labs, re-eval    Labs Reviewed  CBC AND DIFFERENTIAL - Abnormal; Notable for the following:      RBC                           3.97 (*)                       Hgb                           12.5 (*)                       Hematocrit                     37.4 (*)                       Platelets                     123 (*)                     All other components within normal limits  COMPREHENSIVE METABOLIC PANEL - Abnormal; Notable for the following:      Glucose                       146 (*)                        BUN                           32.4 (*)                       Creatinine                    1.5 (*)                        Chloride                      108 (*)                        CO2                           19 (*)                      All other components within normal limits  LIPASE  GFR    Pt had complete resolution of herniated contents through inguinal canal bsck into abdominal cavity with Trendelenberg/valium/dilaudid/ice and weights.    Able to walk around with no herniation.  OUtpt elective hernia repair asap encouraged.  Referred to DR Ahmed, surgeon                Amount and/or Complexity of Data Reviewed  Clinical lab tests: ordered and reviewed  Tests in the radiology section of CPT: ordered and reviewed  Tests in the medicine section of CPT: ordered and reviewed    Risk of Complications, Morbidity, and/or Mortality  Presenting problems: high  Diagnostic procedures: high  Management options: high    Patient Progress  Patient progress: improved  Procedures    Clinical Impression & Disposition     Clinical Impression  Final diagnoses:   Reducible inguinal hernia   Abdominal pain   HTN (hypertension)   Hyperlipidemia        ED Disposition     Discharge Brent [name] discharge to home/self care.    Condition at discharge: Good             New Prescriptions    DIAZEPAM (VALIUM) 5 MG TABLET    Take 1 tablet (5 mg total) by mouth every 6 (six) hours as needed for Anxiety.    ONDANSETRON (ZOFRAN ODT) 4 MG DISINTEGRATING TABLET    Take 1 tablet (4 mg total) by mouth every 6 (six) hours as needed for Nausea.    OXYCODONE-ACETAMINOPHEN (PERCOCET) 5-325 MG PER TABLET    Take 2 tablets by mouth every 4 (four) hours as needed for Pain.                 Oliver Barre, MD  06/25/12 2023

## 2014-03-31 DIAGNOSIS — C4490 Unspecified malignant neoplasm of skin, unspecified: Secondary | ICD-10-CM

## 2014-03-31 HISTORY — DX: Unspecified malignant neoplasm of skin, unspecified: C44.90

## 2015-01-12 ENCOUNTER — Encounter (HOSPITAL_BASED_OUTPATIENT_CLINIC_OR_DEPARTMENT_OTHER): Payer: Self-pay

## 2015-01-25 ENCOUNTER — Encounter (HOSPITAL_BASED_OUTPATIENT_CLINIC_OR_DEPARTMENT_OTHER): Payer: Self-pay | Admitting: Hematology & Oncology

## 2015-01-26 ENCOUNTER — Ambulatory Visit (HOSPITAL_BASED_OUTPATIENT_CLINIC_OR_DEPARTMENT_OTHER): Payer: Medicare Other | Admitting: Hematology & Oncology

## 2015-01-26 ENCOUNTER — Ambulatory Visit (FREE_STANDING_LABORATORY_FACILITY): Payer: Medicare Other | Admitting: Hematology & Oncology

## 2015-01-26 ENCOUNTER — Encounter (HOSPITAL_BASED_OUTPATIENT_CLINIC_OR_DEPARTMENT_OTHER): Payer: Medicare Other | Admitting: Hematology & Oncology

## 2015-01-26 VITALS — BP 114/69 | HR 62 | Temp 98.1°F | Resp 18 | Ht 68.4 in | Wt 233.8 lb

## 2015-01-26 DIAGNOSIS — D708 Other neutropenia: Secondary | ICD-10-CM

## 2015-01-26 DIAGNOSIS — D6189 Other specified aplastic anemias and other bone marrow failure syndromes: Secondary | ICD-10-CM

## 2015-01-26 DIAGNOSIS — D72819 Decreased white blood cell count, unspecified: Secondary | ICD-10-CM

## 2015-01-26 DIAGNOSIS — D649 Anemia, unspecified: Secondary | ICD-10-CM

## 2015-01-26 DIAGNOSIS — D6949 Other primary thrombocytopenia: Secondary | ICD-10-CM

## 2015-01-26 DIAGNOSIS — D619 Aplastic anemia, unspecified: Secondary | ICD-10-CM | POA: Insufficient documentation

## 2015-01-26 DIAGNOSIS — D508 Other iron deficiency anemias: Secondary | ICD-10-CM

## 2015-01-26 DIAGNOSIS — D696 Thrombocytopenia, unspecified: Secondary | ICD-10-CM | POA: Insufficient documentation

## 2015-01-26 DIAGNOSIS — D709 Neutropenia, unspecified: Secondary | ICD-10-CM | POA: Insufficient documentation

## 2015-01-26 DIAGNOSIS — D519 Vitamin B12 deficiency anemia, unspecified: Secondary | ICD-10-CM

## 2015-01-26 DIAGNOSIS — D518 Other vitamin B12 deficiency anemias: Secondary | ICD-10-CM

## 2015-01-26 LAB — IRON PROFILE
Iron Saturation: 42 % (ref 15–50)
Iron: 134 ug/dL (ref 40–160)
TIBC: 317 ug/dL (ref 261–462)
UIBC: 183 ug/dL (ref 126–382)

## 2015-01-26 LAB — COMPREHENSIVE METABOLIC PANEL
ALT: 27 U/L (ref 0–55)
AST (SGOT): 19 U/L (ref 5–34)
Albumin/Globulin Ratio: 1.6 (ref 0.9–2.2)
Albumin: 4.4 g/dL (ref 3.5–5.0)
Alkaline Phosphatase: 62 U/L (ref 38–106)
BUN: 18 mg/dL (ref 9.0–28.0)
Bilirubin, Total: 0.9 mg/dL (ref 0.1–1.2)
CO2: 29 mEq/L (ref 21–30)
Calcium: 9.6 mg/dL (ref 7.9–10.2)
Chloride: 106 mEq/L (ref 100–111)
Creatinine: 1.2 mg/dL (ref 0.5–1.5)
Globulin: 2.7 g/dL (ref 2.0–3.7)
Glucose: 95 mg/dL (ref 70–100)
Potassium: 4.7 mEq/L (ref 3.5–5.3)
Protein, Total: 7.1 g/dL (ref 6.0–8.3)
Sodium: 140 mEq/L (ref 135–146)

## 2015-01-26 LAB — CBC AND DIFFERENTIAL
Basophils Absolute Automated: 0.01 10*3/uL (ref 0.00–0.20)
Basophils Automated: 0 %
Eosinophils Absolute Automated: 0.04 10*3/uL (ref 0.00–0.70)
Eosinophils Automated: 1 %
Hematocrit: 35.3 % — ABNORMAL LOW (ref 42.0–52.0)
Hgb: 11.9 g/dL — ABNORMAL LOW (ref 13.0–17.0)
Immature Granulocytes Absolute: 0.01 10*3/uL
Immature Granulocytes: 0 %
Lymphocytes Absolute Automated: 1.12 10*3/uL (ref 0.50–4.40)
Lymphocytes Automated: 37 %
MCH: 33.8 pg — ABNORMAL HIGH (ref 28.0–32.0)
MCHC: 33.7 g/dL (ref 32.0–36.0)
MCV: 100.3 fL — ABNORMAL HIGH (ref 80.0–100.0)
MPV: 9.2 fL — ABNORMAL LOW (ref 9.4–12.3)
Monocytes Absolute Automated: 0.14 10*3/uL (ref 0.00–1.20)
Monocytes: 5 %
Neutrophils Absolute: 1.7 10*3/uL — ABNORMAL LOW (ref 1.80–8.10)
Neutrophils: 56 %
Nucleated RBC: 0 /100 WBC (ref 0–1)
Platelets: 70 10*3/uL — ABNORMAL LOW (ref 140–400)
RBC: 3.52 10*6/uL — ABNORMAL LOW (ref 4.70–6.00)
RDW: 15 % (ref 12–15)
WBC: 3.02 10*3/uL — ABNORMAL LOW (ref 3.50–10.80)

## 2015-01-26 LAB — FOLATE: Folate: 17.6 ng/mL

## 2015-01-26 LAB — HEMOLYSIS INDEX: Hemolysis Index: 6 (ref 0–18)

## 2015-01-26 LAB — GFR: EGFR: 59.5

## 2015-01-26 LAB — VITAMIN B12: Vitamin B-12: 1320 pg/mL — ABNORMAL HIGH (ref 211–911)

## 2015-01-26 LAB — FERRITIN: Ferritin: 345.78 ng/mL — ABNORMAL HIGH (ref 21.80–274.70)

## 2015-01-26 NOTE — Progress Notes (Signed)
CONSULTATION    Date Time: 01/26/2015 12:34 PM  Patient Name: Brent [name],Brent [name]  Requesting Physician: No att. providers found      Reason for Consultation:   Pancytopenia    History:   HARLEN DANFORD is a 74 y.o. male who was referred to our service for evaluation of pancytopenia.  He is physically active, completely functional, works full-time and exercises on a regular basis.  He denies night sweats, fevers, any fluctuation in his weight, denies bruising, bleeding, petechiae or ecchymosis.  Denies frequent infections or any recent episodes of bronchitis, pneumonia or sinusitis.  He does give a history of pneumonitis at least a few years ago and has had no problems or issues in the last 2 years.  He does mention more than a few alcoholic drinks every few days in a week.   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 DOE (dyspnea on exertion)    . Hypertensive disorder    . Hyperlipidemia    . On home O2 09/2010     @night  2 liters, 88-96%   . Pneumonia    . Clotting disorder      04/2011, PE   . GERD (gastroesophageal reflux disease)    . Cancer      larnygeal cancer, 2004   . Chronic gout      hx   . Arthritis    . Fractures    . Interstitial lung disease    . Anemia        Past Surgical History:     Past Surgical History   Procedure Laterality Date   . Joint replacement       Left TKR, 03/2009   . Tonsillectomy     . Hernia repair         Family History:     Family History   Problem Relation Age of Onset   . Aplastic anemia Mother    . Hypertension Father    . Ovarian cancer Neg Hx    . Lung cancer Neg Hx    . Rectal cancer Neg Hx    . Colon cancer Neg Hx    . Breast cancer Neg Hx    . Cancer Neg Hx        Social History:     Social History     Social History   . Marital Status: Married     Spouse Name: N/A   . Number of Children: N/A   . Years of Education: N/A     Occupational History   . Not on file.      Social History Main Topics   . Smoking status: Former Smoker -- 20 years   . Smokeless tobacco: Never Used   . Alcohol Use: 1.2 oz/week     2 Shots of liquor per week      Comment: pt states he quit 3 weeks ago   . Drug Use: No   . Sexual Activity: No     Other Topics Concern   . Exposure To Radiation? Yes     Radiation Treatment in 2003   . Exposure To Other Chemicals? No   . Exposure To Asbestos? No     Social History Narrative       Allergies:   No Known Allergies    Medications:  Current Outpatient Prescriptions   Medication Sig Dispense Refill   . atorvastatin (LIPITOR) 40 MG tablet Take 40 mg by mouth daily.     Marland Kitchen co-enzyme Q-10 30 MG capsule Take 30 mg by mouth 3 (three) times daily.     Marland Kitchen lisinopril (PRINIVIL,ZESTRIL) 10 MG tablet Take 20 mg by mouth daily.        . Multiple Vitamin (MULTIVITAMIN) tablet Take 1 tablet by mouth daily.     . pantoprazole (PROTONIX) 40 MG tablet Take 40 mg by mouth daily.     . vitamin B-12 (CYANOCOBALAMIN) 500 MCG tablet Take 500 mcg by mouth daily.       No current facility-administered medications for this visit.       Review of Systems:   A comprehensive review of systems was: General ROS: positive for  - fatigue  negative for - chills or fever  Psychological ROS: negative for - anxiety or depression  Hematological and Lymphatic ROS: positive for - blood clots  negative for - bleeding problems, night sweats or swollen lymph nodes  Respiratory ROS: positive for - cough, shortness of breath and tachypnea  Cardiovascular ROS: no chest pain or dyspnea on exertion  Gastrointestinal ROS: no abdominal pain, change in bowel habits, or black or bloody stools  Genito-Urinary ROS: no dysuria, trouble voiding, or hematuria  Neurological ROS: negative for - bowel and bladder control changes, impaired coordination/balance or tremors    Physical Exam:     Filed Vitals:    01/26/15 1131   BP: 114/69   Pulse: 62   Temp: 98.1 [name] (36.7 C)   Resp: 18   SpO2: 96%           General  appearance - alert, well appearing, and in no distress  Mental status - alert, oriented to person, place, and time  Eyes - pupils equal and reactive, extraocular eye movements intact  Ears - bilateral TM's and external ear canals normal  Mouth - mucous membranes moist, pharynx normal without lesions  Neck - supple, no significant adenopathy  Lymphatics - no palpable lymphadenopathy, no hepatosplenomegaly  Chest - clear to auscultation, no wheezes, rales or rhonchi, symmetric air entry  Heart - normal rate, regular rhythm, normal S1, S2, no murmurs, rubs, clicks or gallops  Abdomen - soft, nontender, nondistended, no masses or organomegaly  Musculoskeletal - no joint tenderness, deformity or swelling  Extremities - peripheral pulses normal, no pedal edema, no clubbing or cyanosis    Labs Reviewed:     Results     Procedure Component Value Units Date/Time    CBC and differential [[id number]]  (Abnormal) Collected:  01/26/15 1129    Specimen Information:  Blood from Blood Updated:  01/26/15 1136     WBC 3.02 (L) x10 3/uL      Hgb 11.9 (L) g/dL      Hematocrit 36.6 (L) %      Platelets 70 (L) x10 3/uL      RBC 3.52 (L) x10 6/uL      MCV 100.3 (H) fL      MCH 33.8 (H) pg      MCHC 33.7 g/dL      RDW 15 %      MPV 9.2 (L) fL      Neutrophils 56 %      Lymphocytes Automated 37 %      Monocytes 5 %      Eosinophils Automated 1 %      Basophils Automated  0 %      Immature Granulocyte 0 %      Nucleated RBC 0 /100 WBC      Neutrophils Absolute 1.70 (L) x10 3/uL      Abs Lymph Automated 1.12 x10 3/uL      Abs Mono Automated 0.14 x10 3/uL      Abs Eos Automated 0.04 x10 3/uL      Absolute Baso Automated 0.01 x10 3/uL      Absolute Immature Granulocyte 0.01 x10 3/uL     Narrative:      Has the patient fasted?->No    Immunofixation electrophoresis, Serum [[id number]] Collected:  01/26/15 1129    Specimen Information:  Blood Updated:  01/26/15 1129    Narrative:      Has the patient fasted?->No    Ferritin [[id number]] Collected:   01/26/15 1129    Specimen Information:  Blood Updated:  01/26/15 1129    Narrative:      Has the patient fasted?->No    Folate [[id number]] Collected:  01/26/15 1129    Specimen Information:  Blood Updated:  01/26/15 1129    Narrative:      Has the patient fasted?->No    Vitamin B12 [[id number]] Collected:  01/26/15 1129    Specimen Information:  Blood Updated:  01/26/15 1129    Narrative:      Has the patient fasted?->No    IRON PROFILE [[id number]] Collected:  01/26/15 1129     Updated:  01/26/15 1129    Narrative:      Has the patient fasted?->No    Comprehensive metabolic panel [[id number]] Collected:  01/26/15 1129    Specimen Information:  Blood Updated:  01/26/15 1129    Narrative:      Has the patient fasted?->No    Protein electrophoresis, serum [[id number]] Collected:  01/26/15 1129    Specimen Information:  Blood Updated:  01/26/15 1129    Narrative:      Has the patient fasted?->No  Pro Electro Serum    Leukemia/Lymphoma Evaluation Panel [[id number]] Collected:  01/26/15 1129     Updated:  01/26/15 1129    Narrative:      Has the patient fasted?->No              Rads:   Radiological Procedure reviewed.     Assessment:   Pancytopenia.  Reviewing his past labs, his numbers have fluctuated over time  Rule out myelodysplasia/hematological malignancy  Rule out nutritional deficiency.  B12/folate  Patient does give  history of alcoholic beverages more than a few, every few days. Alcoholic binge can cause bone marrow suppression with cytopenias    Plan:   Will order a comprehensive workup which will include B12, folic acid levels.  Flow cytometry on his peripheral blood.  Serum protein electrophoresis to rule out monoclonal spike  Follow-up visit once these results are available.   Did discuss with patient his platelet counts are much lower than his previous numbers and may be related to four alcoholic drinks that he had last night  Possible bone marrow aspiration biopsy discussed if need be for further  evaluation              Signed by: Acquanetta Sit

## 2015-01-28 LAB — LEUKEMIA/LYMPHOMA EVALUATION PANEL
Number of Markers:: 22
Viability: 99 %

## 2015-01-29 LAB — PROTEIN ELECTROPHORESIS, SERUM
Albumin %: 54.9 % (ref 46.6–62.6)
Albumin, Synovial: 3.9 g/dL (ref 3.4–4.8)
Alpha-1 Glob %: 3 % (ref 1.7–4.1)
Alpha-1 Globulin: 0.2 g/dL (ref 0.1–0.4)
Alpha-2 Glob %: 10.6 % (ref 8.9–14.9)
Alpha-2 Globulin: 0.8 g/dL (ref 0.8–1.2)
Beta Glob %: 13.6 % (ref 10.9–18.9)
Beta Globulin: 1 g/dL (ref 0.6–1.2)
Gamma Globulin %: 17.9 % (ref 9.8–24.4)
Gamma Globulin: 1.3 g/dL (ref 0.6–1.7)
Protein, Total: 7.1 g/dL (ref 6.0–8.3)

## 2015-01-29 LAB — IMMUNOFIXATION ELECTROPHORESIS

## 2015-01-29 LAB — IFE REVIEW, SERUM

## 2015-01-29 LAB — SERUM PROTEIN ELECTROPHORESIS REVIEW

## 2015-02-08 ENCOUNTER — Telehealth (INDEPENDENT_AMBULATORY_CARE_PROVIDER_SITE_OTHER): Payer: Self-pay

## 2015-02-08 NOTE — Telephone Encounter (Signed)
vm left explaining to patient that [facility] does not have any appts after 3 tomorrow  Advised to call office ,if we can assist in any way

## 2015-02-08 NOTE — Telephone Encounter (Signed)
Pt left vm on Dr. Dawayne Patricia direct line. He said he has an appt with Dr. Welton Flakes tomorrow at 2pm, but he would like to change it to 3:30pm or later. He can be reached at [phone]. Please call.

## 2015-02-09 ENCOUNTER — Encounter (HOSPITAL_BASED_OUTPATIENT_CLINIC_OR_DEPARTMENT_OTHER): Payer: Self-pay | Admitting: Hematology & Oncology

## 2015-02-09 ENCOUNTER — Ambulatory Visit (FREE_STANDING_LABORATORY_FACILITY): Payer: Medicare Other | Admitting: Hematology & Oncology

## 2015-02-09 VITALS — BP 118/75 | HR 63 | Temp 97.8°F | Resp 18

## 2015-02-09 DIAGNOSIS — D696 Thrombocytopenia, unspecified: Secondary | ICD-10-CM

## 2015-02-09 DIAGNOSIS — D709 Neutropenia, unspecified: Secondary | ICD-10-CM

## 2015-02-09 LAB — CBC AND DIFFERENTIAL
Basophils Absolute Automated: 0.01 10*3/uL (ref 0.00–0.20)
Basophils Automated: 0 %
Eosinophils Absolute Automated: 0.04 10*3/uL (ref 0.00–0.70)
Eosinophils Automated: 1 %
Hematocrit: 32.6 % — ABNORMAL LOW (ref 42.0–52.0)
Hgb: 11.3 g/dL — ABNORMAL LOW (ref 13.0–17.0)
Immature Granulocytes Absolute: 0 10*3/uL
Immature Granulocytes: 0 %
Lymphocytes Absolute Automated: 1.51 10*3/uL (ref 0.50–4.40)
Lymphocytes Automated: 47 %
MCH: 34.7 pg — ABNORMAL HIGH (ref 28.0–32.0)
MCHC: 34.7 g/dL (ref 32.0–36.0)
MCV: 100 fL (ref 80.0–100.0)
MPV: 8.9 fL — ABNORMAL LOW (ref 9.4–12.3)
Monocytes Absolute Automated: 0.14 10*3/uL (ref 0.00–1.20)
Monocytes: 4 %
Neutrophils Absolute: 1.54 10*3/uL — ABNORMAL LOW (ref 1.80–8.10)
Neutrophils: 48 %
Nucleated RBC: 0 /100 WBC (ref 0–1)
Platelets: 78 10*3/uL — ABNORMAL LOW (ref 140–400)
RBC: 3.26 10*6/uL — ABNORMAL LOW (ref 4.70–6.00)
RDW: 15 % (ref 12–15)
WBC: 3.24 10*3/uL — ABNORMAL LOW (ref 3.50–10.80)

## 2015-02-12 ENCOUNTER — Encounter (HOSPITAL_BASED_OUTPATIENT_CLINIC_OR_DEPARTMENT_OTHER): Payer: Self-pay | Admitting: Hematology & Oncology

## 2015-02-12 NOTE — Progress Notes (Signed)
[facility] Office Visit Note      Patient Name: Brent [name],Brent [name]      Interval history:   Patient is here for a follow-up visit.  He denies any fatigue, tiredness, any bleeding or bruising episodes.  He again mentions that last week he has been drinking heavily.     Medications:     No outpatient prescriptions have been marked as taking for the 02/09/15 encounter (Office Visit) with Acquanetta Sit, MD.         Past History:   No Known Allergies    Past Medical History   Diagnosis Date   . DOE (dyspnea on exertion)    . Hypertensive disorder    . Hyperlipidemia    . On home O2 09/2010     @night  2 liters, 88-96%   . Pneumonia    . Clotting disorder      04/2011, PE   . GERD (gastroesophageal reflux disease)    . Cancer      larnygeal cancer, 2004   . Chronic gout      hx   . Arthritis    . Fractures    . Interstitial lung disease    . Anemia         Review of Systems:   A comprehensive review of systems was:  General ROS: positive for - minimal fatigue  negative for - chills or fever  Psychological ROS: negative for - anxiety or depression  Hematological and Lymphatic ROS: positive for - blood clots  negative for - bleeding problems, night sweats or swollen lymph nodes  Respiratory ROS: positive for - cough, shortness of breath and tachypnea  Cardiovascular ROS: no chest pain or dyspnea on exertion  Gastrointestinal ROS: no abdominal pain, change in bowel habits, or black or bloody stools  Genito-Urinary ROS: no dysuria, trouble voiding, or hematuria  Neurological ROS: negative for - bowel and bladder control changes, impaired coordination/balance or tremors    Physical Exam:     Filed Vitals:    02/09/15 1400   BP: 118/75   Pulse: 63   Temp: 97.8 [name] (36.6 C)   Resp: 18   SpO2: 97%         General appearance - alert, well appearing, and in no distress  Mental status - alert, oriented to person, place, and time  Eyes - pupils equal and reactive, extraocular eye movements intact  Ears - bilateral TM's and  external ear canals normal  Mouth - mucous membranes moist, pharynx normal without lesions  Neck - supple, no significant adenopathy  Lymphatics - no palpable lymphadenopathy, no hepatosplenomegaly  Chest - clear to auscultation, no wheezes, rales or rhonchi, symmetric air entry  Heart - normal rate, regular rhythm, normal S1, S2, no murmurs, rubs, clicks or gallops  Abdomen - soft, nontender, nondistended, no masses or organomegaly  Musculoskeletal - no joint tenderness, deformity or swelling  Extremities - peripheral pulses normal, no pedal edema, no clubbing or cyanosis      Labs:     Office Visit on 02/09/2015   Component Date Value Ref Range Status   . WBC 02/09/2015 3.24* 3.50 - 10.80 x10 3/uL Final   . Hgb 02/09/2015 11.3* 13.0 - 17.0 g/dL Final   . Hematocrit 16/12/9602 32.6* 42.0 - 52.0 % Final   . Platelets 02/09/2015 78* 140 - 400 x10 3/uL Final   . RBC 02/09/2015 3.26* 4.70 - 6.00 x10 6/uL Final   . MCV 02/09/2015 100.0  80.0 - 100.0 fL Final   . MCH 02/09/2015 34.7* 28.0 - 32.0 pg Final   . MCHC 02/09/2015 34.7  32.0 - 36.0 g/dL Final   . RDW 96/29/5284 15  12 - 15 % Final   . MPV 02/09/2015 8.9* 9.4 - 12.3 fL Final   . Neutrophils 02/09/2015 48  None % Final   . Lymphocytes Automated 02/09/2015 47  None % Final   . Monocytes 02/09/2015 4  None % Final   . Eosinophils Automated 02/09/2015 1  None % Final   . Basophils Automated 02/09/2015 0  None % Final   . Immature Granulocyte 02/09/2015 0  None % Final   . Nucleated RBC 02/09/2015 0  0 - 1 /100 WBC Final   . Neutrophils Absolute 02/09/2015 1.54* 1.80 - 8.10 x10 3/uL Final   . Abs Lymph Automated 02/09/2015 1.51  0.50 - 4.40 x10 3/uL Final   . Abs Mono Automated 02/09/2015 0.14  0.00 - 1.20 x10 3/uL Final   . Abs Eos Automated 02/09/2015 0.04  0.00 - 0.70 x10 3/uL Final   . Absolute Baso Automated 02/09/2015 0.01  0.00 - 0.20 x10 3/uL Final   . Absolute Immature Granulocyte 02/09/2015 0.00  0 x10 3/uL Final         Rads:   No results  found.    Assessment:   Pancytopenia again noted today from his CBC  Extensive workup, including flow cytometry, serum protein electrophoresis,nutritional studies were all negative for any evidence of abnormality  Rule out myelodysplasia  Rule out alcohol mediated bone marrow suppression as patient continues to binge drink heavily  Plan:   Today we had a detailed a lengthy discussion about his and cytopenia  Patient continues to drink heavily and I discussed possibility of alcohol mediated bone marrow suppression versus myelodysplasia  Discussed that all of the workup was essentially negative.  Ordered on the day of his consultation 01/26/15  Patient will avoid any further alcohol and will follow-up in 2 weeks' time  In case he continues to be pancytopenic will consider bone marrow aspiration biopsy

## 2015-03-01 HISTORY — PX: BONE MARROW BIOPSY: SHX199

## 2015-03-07 ENCOUNTER — Other Ambulatory Visit (HOSPITAL_BASED_OUTPATIENT_CLINIC_OR_DEPARTMENT_OTHER): Payer: Self-pay | Admitting: Hematology & Oncology

## 2015-03-07 ENCOUNTER — Ambulatory Visit (FREE_STANDING_LABORATORY_FACILITY): Payer: Medicare Other | Admitting: Hematology & Oncology

## 2015-03-07 ENCOUNTER — Encounter (HOSPITAL_BASED_OUTPATIENT_CLINIC_OR_DEPARTMENT_OTHER): Payer: Self-pay | Admitting: Hematology & Oncology

## 2015-03-07 VITALS — BP 110/67 | HR 67 | Temp 98.2°F | Resp 18

## 2015-03-07 DIAGNOSIS — D696 Thrombocytopenia, unspecified: Secondary | ICD-10-CM

## 2015-03-07 DIAGNOSIS — D709 Neutropenia, unspecified: Secondary | ICD-10-CM

## 2015-03-07 DIAGNOSIS — D61818 Other pancytopenia: Secondary | ICD-10-CM

## 2015-03-07 LAB — CBC AND DIFFERENTIAL
Basophils Absolute Automated: 0.02 10*3/uL (ref 0.00–0.20)
Basophils Automated: 1 %
Eosinophils Absolute Automated: 0.06 10*3/uL (ref 0.00–0.70)
Eosinophils Automated: 2 %
Hematocrit: 33.6 % — ABNORMAL LOW (ref 42.0–52.0)
Hgb: 11.4 g/dL — ABNORMAL LOW (ref 13.0–17.0)
Immature Granulocytes Absolute: 0.01 10*3/uL
Immature Granulocytes: 0 %
Lymphocytes Absolute Automated: 1.15 10*3/uL (ref 0.50–4.40)
Lymphocytes Automated: 35 %
MCH: 33.9 pg — ABNORMAL HIGH (ref 28.0–32.0)
MCHC: 33.9 g/dL (ref 32.0–36.0)
MCV: 100 fL (ref 80.0–100.0)
MPV: 9.4 fL (ref 9.4–12.3)
Monocytes Absolute Automated: 0.18 10*3/uL (ref 0.00–1.20)
Monocytes: 6 %
Neutrophils Absolute: 1.83 10*3/uL (ref 1.80–8.10)
Neutrophils: 56 %
Nucleated RBC: 0 /100 WBC (ref 0–1)
Platelets: 100 10*3/uL — ABNORMAL LOW (ref 140–400)
RBC: 3.36 10*6/uL — ABNORMAL LOW (ref 4.70–6.00)
RDW: 14 % (ref 12–15)
WBC: 3.25 10*3/uL — ABNORMAL LOW (ref 3.50–10.80)

## 2015-03-07 NOTE — Procedures (Signed)
Bone Marrow Biopsy and Aspiration Procedure Note     Informed consent was obtained and potential risks including bleeding, infection and pain were reviewed with the patient.     Posterior iliac crest(s) prepped with Betadine.     Lidocaine 2% local anesthesia infiltrated into the subcutaneous tissue.    Left bone marrow biopsy and left bone marrow aspirate was obtained.     The procedure was tolerated well and there were no complications.    Specimens sent for: routine histopathologic stains and sectioning, flow cytometry, cytogenetics and molecular analysis    Physician: Zyir Gassert F Antowan Samford

## 2015-03-09 ENCOUNTER — Encounter (HOSPITAL_BASED_OUTPATIENT_CLINIC_OR_DEPARTMENT_OTHER): Payer: Self-pay

## 2015-03-16 LAB — BONE MARROW CORRELATION (AMERIPATH)

## 2015-03-16 LAB — CHROMOSOME ANALYSIS, HEMATOLOGIC MALIGNA

## 2015-03-16 LAB — LEUKEMIA/LYMPHOMA EVALUATION PANEL

## 2015-03-16 LAB — HEMATOPATHOLOGY MORPHOLOGIC EVALUATION (AMERIPATH)

## 2015-03-21 ENCOUNTER — Ambulatory Visit (HOSPITAL_BASED_OUTPATIENT_CLINIC_OR_DEPARTMENT_OTHER): Payer: Medicare Other | Admitting: Hematology & Oncology

## 2015-03-23 ENCOUNTER — Ambulatory Visit (HOSPITAL_BASED_OUTPATIENT_CLINIC_OR_DEPARTMENT_OTHER): Payer: Medicare Other | Admitting: Hematology & Oncology

## 2015-03-23 ENCOUNTER — Ambulatory Visit (FREE_STANDING_LABORATORY_FACILITY): Payer: Medicare Other | Admitting: Hematology & Oncology

## 2015-03-23 ENCOUNTER — Encounter (HOSPITAL_BASED_OUTPATIENT_CLINIC_OR_DEPARTMENT_OTHER): Payer: Self-pay | Admitting: Hematology & Oncology

## 2015-03-23 VITALS — BP 109/68 | HR 56 | Temp 98.0°F | Resp 18

## 2015-03-23 DIAGNOSIS — D696 Thrombocytopenia, unspecified: Secondary | ICD-10-CM

## 2015-03-23 DIAGNOSIS — R0609 Other forms of dyspnea: Secondary | ICD-10-CM

## 2015-03-23 LAB — CBC AND DIFFERENTIAL
Basophils Absolute Automated: 0.02 10*3/uL (ref 0.00–0.20)
Basophils Automated: 1 %
Eosinophils Absolute Automated: 0.08 10*3/uL (ref 0.00–0.70)
Eosinophils Automated: 3 %
Hematocrit: 33 % — ABNORMAL LOW (ref 42.0–52.0)
Hgb: 11.2 g/dL — ABNORMAL LOW (ref 13.0–17.0)
Immature Granulocytes Absolute: 0.01 10*3/uL
Immature Granulocytes: 0 %
Lymphocytes Absolute Automated: 1.12 10*3/uL (ref 0.50–4.40)
Lymphocytes Automated: 38 %
MCH: 34.1 pg — ABNORMAL HIGH (ref 28.0–32.0)
MCHC: 33.9 g/dL (ref 32.0–36.0)
MCV: 100.6 fL — ABNORMAL HIGH (ref 80.0–100.0)
MPV: 9.5 fL (ref 9.4–12.3)
Monocytes Absolute Automated: 0.12 10*3/uL (ref 0.00–1.20)
Monocytes: 4 %
Neutrophils Absolute: 1.61 10*3/uL — ABNORMAL LOW (ref 1.80–8.10)
Neutrophils: 54 %
Nucleated RBC: 0 /100 WBC (ref 0–1)
Platelets: 82 10*3/uL — ABNORMAL LOW (ref 140–400)
RBC: 3.28 10*6/uL — ABNORMAL LOW (ref 4.70–6.00)
RDW: 15 % (ref 12–15)
WBC: 2.96 10*3/uL — ABNORMAL LOW (ref 3.50–10.80)

## 2015-03-23 NOTE — Progress Notes (Signed)
[facility] Office Visit Note      Patient Name: Brent [name], Brent [name]      Interval history:   Mr. Osment is here for a follow up visit accompanied by his wife. He is stable, physically active and has no complaints or concerns.  He denies night sweats, fevers, weight loss.  Denies any easy bruising, petechia, ecchymoses, or ecchymosis    Medications:     No outpatient prescriptions have been marked as taking for the 03/23/15 encounter (Office Visit) with Acquanetta Sit, MD.         Past History:   No Known Allergies    Past Medical History   Diagnosis Date   . DOE (dyspnea on exertion)    . Hypertensive disorder    . Hyperlipidemia    . On home O2 09/2010     @night  2 liters, 88-96%   . Pneumonia    . Clotting disorder      04/2011, PE   . GERD (gastroesophageal reflux disease)    . Cancer      larnygeal cancer, 2004   . Chronic gout      hx   . Arthritis    . Fractures    . Interstitial lung disease    . Anemia         Review of Systems:   A comprehensive review of systems was:   General ROS: positive for - minimal fatigue  negative for - chills or fever  Psychological ROS: negative for - anxiety or depression  Hematological and Lymphatic ROS: positive for - blood clots  negative for - bleeding problems, night sweats or swollen lymph nodes  Respiratory ROS: positive for - cough, shortness of breath and tachypnea  Cardiovascular ROS: no chest pain or dyspnea on exertion  Gastrointestinal ROS: no abdominal pain, change in bowel habits, or black or bloody stools  Genito-Urinary ROS: no dysuria, trouble voiding, or hematuria  Neurological ROS: negative for - bowel and bladder control changes, impaired coordination/balance or tremors      Physical Exam:     Filed Vitals:    03/23/15 1003   BP: 109/68   Pulse: 56   Temp: 98 F (36.7 C)   Resp: 18   SpO2: 94%     General appearance - alert, well appearing, and in no distress  Mental status - alert, oriented to person, place, and time  Eyes - pupils equal and  reactive, extraocular eye movements intact  Ears - bilateral TM's and external ear canals normal  Mouth - mucous membranes moist, pharynx normal without lesions  Neck - supple, no significant adenopathy  Lymphatics - no palpable lymphadenopathy, no hepatosplenomegaly  Chest - clear to auscultation, no wheezes, rales or rhonchi, symmetric air entry  Heart - normal rate, regular rhythm, normal S1, S2, no murmurs, rubs, clicks or gallops  Abdomen - soft, nontender, nondistended, no masses or organomegaly  Musculoskeletal - no joint tenderness, deformity or swelling  Extremities - peripheral pulses normal, no pedal edema, no clubbing or cyanosis          Labs:     Office Visit on 03/23/2015   Component Date Value Ref Range Status   . WBC 03/23/2015 2.96* 3.50 - 10.80 x10 3/uL Final   . Hgb 03/23/2015 11.2* 13.0 - 17.0 g/dL Final   . Hematocrit 16/12/9602 33.0* 42.0 - 52.0 % Final   . Platelets 03/23/2015 82* 140 - 400 x10 3/uL Final   . RBC 03/23/2015 3.28*  4.70 - 6.00 x10 6/uL Final   . MCV 03/23/2015 100.6* 80.0 - 100.0 fL Final   . MCH 03/23/2015 34.1* 28.0 - 32.0 pg Final   . MCHC 03/23/2015 33.9  32.0 - 36.0 g/dL Final   . RDW 16/12/9602 15  12 - 15 % Final   . MPV 03/23/2015 9.5  9.4 - 12.3 fL Final   . Neutrophils 03/23/2015 54  None % Final   . Lymphocytes Automated 03/23/2015 38  None % Final   . Monocytes 03/23/2015 4  None % Final   . Eosinophils Automated 03/23/2015 3  None % Final   . Basophils Automated 03/23/2015 1  None % Final   . Immature Granulocyte 03/23/2015 0  None % Final   . Nucleated RBC 03/23/2015 0  0 - 1 /100 WBC Final   . Neutrophils Absolute 03/23/2015 1.61* 1.80 - 8.10 x10 3/uL Final   . Abs Lymph Automated 03/23/2015 1.12  0.50 - 4.40 x10 3/uL Final   . Abs Mono Automated 03/23/2015 0.12  0.00 - 1.20 x10 3/uL Final   . Abs Eos Automated 03/23/2015 0.08  0.00 - 0.70 x10 3/uL Final   . Absolute Baso Automated 03/23/2015 0.02  0.00 - 0.20 x10 3/uL Final   . Absolute Immature Granulocyte  03/23/2015 0.01  0 x10 3/uL Final         Rads:   No results found.    Assessment:   Pancytopenia. With counts fluctuating over time.  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 Cytopenias can be from marrow suppression from alcohol  H/O Laryngeal cancer treated with Radiation therapy 2004  Plan:   I had a detailed discussion with patient and his wife.  Reviewed all of his labs again and bone marrow aspiration biopsy report  There is no evidence of hematological malignancy.  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

## 2015-03-29 ENCOUNTER — Encounter (INDEPENDENT_AMBULATORY_CARE_PROVIDER_SITE_OTHER): Payer: Self-pay

## 2015-04-18 ENCOUNTER — Ambulatory Visit: Payer: Medicare Other

## 2015-04-18 DIAGNOSIS — Z01818 Encounter for other preprocedural examination: Secondary | ICD-10-CM

## 2015-04-18 NOTE — Pre-Procedure Instructions (Signed)
INSTRUCTIONS: ARRIVE 0700 FOR PROCEDURE AT 0830 ON 04/26/15.  TAKE CRESTOR AM DOS. HOLD LISINOPRIL, PANTOPRAZOLE DOS. WILL SHCEK WITH DR Brantley Fling TO SEE IF PANTOPRAZOLE SHOULD BE HELD X 7 DAYS PREOP.  WILL HAVE CBC WITH DIFF DRAWN [facility] 04/25/15 PER DR Brantley Fling.

## 2015-04-20 ENCOUNTER — Encounter: Payer: Self-pay | Admitting: Anesthesiology

## 2015-04-20 NOTE — Anesthesia Preprocedure Evaluation (Addendum)
PSS Anesthesia Comments: ECG    Pre-evaluation Note Incomplete - DO NOT USE FOR CLINICAL DECISIONS    Anesthesia Plan        (HTN  EtOH  Pancytopenia, hematology w/u negative. ?EtOH-related. Labs on chart, PLT 80  XRT for larynx CA 2004  GERD/Barrett's esophagus   )                        pertinent labs reviewed

## 2015-04-24 NOTE — Pre-Procedure Instructions (Addendum)
Per PCP office, pt's EKG was abnormal.  Dennie Bible, nurse @ the PCP office said he was sent to [facility] for clearance.  Confirmed that he has an appt w/[facility], Tuesday 1/31 @ 0900.  Dr. Faylene Million office notified.

## 2015-04-25 ENCOUNTER — Other Ambulatory Visit: Payer: Medicare Other

## 2015-04-26 ENCOUNTER — Ambulatory Visit: Admission: RE | Admit: 2015-04-26 | Payer: Medicare Other | Source: Ambulatory Visit | Admitting: Gastroenterology

## 2015-04-26 ENCOUNTER — Encounter: Admission: RE | Payer: Self-pay | Source: Ambulatory Visit

## 2015-04-26 SURGERY — DONT USE, USE 1095-ESOPHAGOGASTRODUODENOSCOPY (EGD), DIAGNOSTIC
Anesthesia: Monitor Anesthesia Care | Site: Esophagus

## 2015-06-21 ENCOUNTER — Ambulatory Visit (FREE_STANDING_LABORATORY_FACILITY): Payer: Medicare Other | Admitting: Hematology & Oncology

## 2015-06-21 ENCOUNTER — Encounter (HOSPITAL_BASED_OUTPATIENT_CLINIC_OR_DEPARTMENT_OTHER): Payer: Self-pay | Admitting: Hematology & Oncology

## 2015-06-21 VITALS — BP 120/74 | HR 79 | Temp 98.2°F | Resp 18

## 2015-06-21 DIAGNOSIS — D61818 Other pancytopenia: Secondary | ICD-10-CM

## 2015-06-21 LAB — GFR: EGFR: 54.2

## 2015-06-21 LAB — COMPREHENSIVE METABOLIC PANEL
ALT: 20 U/L (ref 0–55)
AST (SGOT): 15 U/L (ref 5–34)
Albumin/Globulin Ratio: 1.4 (ref 0.9–2.2)
Albumin: 4.2 g/dL (ref 3.5–5.0)
Alkaline Phosphatase: 64 U/L (ref 38–106)
BUN: 21 mg/dL (ref 9.0–28.0)
Bilirubin, Total: 1 mg/dL (ref 0.1–1.2)
CO2: 26 mEq/L (ref 21–30)
Calcium: 9.5 mg/dL (ref 7.9–10.2)
Chloride: 105 mEq/L (ref 100–111)
Creatinine: 1.3 mg/dL (ref 0.5–1.5)
Globulin: 3.1 g/dL (ref 2.0–3.7)
Glucose: 88 mg/dL (ref 70–100)
Potassium: 4.2 mEq/L (ref 3.5–5.3)
Protein, Total: 7.3 g/dL (ref 6.0–8.3)
Sodium: 142 mEq/L (ref 135–146)

## 2015-06-21 LAB — CBC AND DIFFERENTIAL
Basophils Absolute Automated: 0 10*3/uL (ref 0.00–0.20)
Basophils Automated: 0 %
Eosinophils Absolute Automated: 0.04 10*3/uL (ref 0.00–0.70)
Eosinophils Automated: 1 %
Hematocrit: 35 % — ABNORMAL LOW (ref 42.0–52.0)
Hgb: 12 g/dL — ABNORMAL LOW (ref 13.0–17.0)
Immature Granulocytes Absolute: 0.01 10*3/uL
Immature Granulocytes: 0 %
Lymphocytes Absolute Automated: 0.95 10*3/uL (ref 0.50–4.40)
Lymphocytes Automated: 32 %
MCH: 34.3 pg — ABNORMAL HIGH (ref 28.0–32.0)
MCHC: 34.3 g/dL (ref 32.0–36.0)
MCV: 100 fL (ref 80.0–100.0)
MPV: 9.5 fL (ref 9.4–12.3)
Monocytes Absolute Automated: 0.12 10*3/uL (ref 0.00–1.20)
Monocytes: 4 %
Neutrophils Absolute: 1.84 10*3/uL (ref 1.80–8.10)
Neutrophils: 62 %
Nucleated RBC: 0 /100 WBC (ref 0–1)
Platelets: 66 10*3/uL — ABNORMAL LOW (ref 140–400)
RBC: 3.5 10*6/uL — ABNORMAL LOW (ref 4.70–6.00)
RDW: 15 % (ref 12–15)
WBC: 2.96 10*3/uL — ABNORMAL LOW (ref 3.50–10.80)

## 2015-06-21 LAB — HEMOLYSIS INDEX: Hemolysis Index: 4 (ref 0–18)

## 2015-06-21 NOTE — Progress Notes (Signed)
[facility] Office Visit Note      Patient Name: Brent [name], Brent [name]      Interval history:   Mr. Testa is here for a follow up visit. He has no complaints accept for recent URI. He denies bruising bleeding fatigue or night sweats    Medications:     No outpatient prescriptions have been marked as taking for the 06/21/15 encounter (Appointment) with Acquanetta Sit, MD.         Past History:   No Known Allergies    Past Medical History   Diagnosis Date   . Hyperlipidemia    . On home O2 09/2010     @night  2 liters, 88-96% AFTER PNUEMONIA. HAS NOT USED IN 3 YEARS    . Clotting disorder      04/2011, PE   . Chronic gout      hx-LAST FLARE 6-8 MONTHS AGO-TAKES COLCHICINE BUT DOES NOT REMEMBER DOSAGE    . Hard to intubate      TROUBLE ONCE AFTER THROAT CANCER 2003, HASD BEEN INTUBATED WITHOUT DIFF SINCE    . DOE (dyspnea on exertion)      PUT IN BY DR Welton Flakes, PT DENIES    . Hypertensive disorder      CONTROLLED    . Disorder of prostate      BPH    . GERD (gastroesophageal reflux disease)      MED EFFECTIVE    . Hiatal hernia    . 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 Anemia      PANCYTOPENIA-SEE DR [facility] NOTE IN [facility] 03/23/15. CBC WITH DIFF 04/24/14 PER DR Welton Flakes   . Arthritis      RIGHT KNEE-CORTISONE SHOT 04/03/15   . Fractures      NONE IN LAST 2 YEARS    . Pneumonia 2013   . Interstitial lung disease    . Pulmonary embolus 2013     TREATED IN [place] X 3 MONTHS        Review of Systems:   A comprehensive review of systems was:   General ROS: positive for - minimal fatigue  negative for - chills or fever  Psychological ROS: negative for - anxiety or depression  Hematological and Lymphatic ROS: positive for - blood clots  negative for - bleeding problems, night sweats or swollen lymph nodes  Respiratory ROS: positive for - cough, shortness of breath and tachypnea  Cardiovascular ROS: no chest pain  or dyspnea on exertion  Gastrointestinal ROS: no abdominal pain, change in bowel habits, or black or bloody stools  Genito-Urinary ROS: no dysuria, trouble voiding, or hematuria  Neurological ROS: negative for - bowel and bladder control changes, impaired coordination/balance or tremors      Physical Exam:   There were no vitals filed for this visit.    General appearance - alert, well appearing, and in no distress  Mental status - alert, oriented to person, place, and time  Eyes - pupils equal and reactive, extraocular eye movements intact  Ears - bilateral TM's and external ear canals normal  Mouth - mucous membranes moist, pharynx normal without lesions  Neck - supple, no significant adenopathy  Lymphatics - no palpable lymphadenopathy, no hepatosplenomegaly  Chest - clear to auscultation, no wheezes, rales or rhonchi, symmetric air entry  Heart - normal rate,  regular rhythm, normal S1, S2, no murmurs, rubs, clicks or gallops  Abdomen - soft, nontender, nondistended, no masses or organomegaly  Musculoskeletal - no joint tenderness, deformity or swelling  Extremities - peripheral pulses normal, no pedal edema, no clubbing or cyanosis          Labs:   No visits with results within 1 Day(s) from this visit.  Latest known visit with results is:    Office Visit on 03/23/2015   Component Date Value Ref Range Status   . WBC 03/23/2015 2.96* 3.50 - 10.80 x10 3/uL Final   . Hgb 03/23/2015 11.2* 13.0 - 17.0 g/dL Final   . Hematocrit 44/03/270 33.0* 42.0 - 52.0 % Final   . Platelets 03/23/2015 82* 140 - 400 x10 3/uL Final   . RBC 03/23/2015 3.28* 4.70 - 6.00 x10 6/uL Final   . MCV 03/23/2015 100.6* 80.0 - 100.0 fL Final   . MCH 03/23/2015 34.1* 28.0 - 32.0 pg Final   . MCHC 03/23/2015 33.9  32.0 - 36.0 g/dL Final   . RDW 53/66/4403 15  12 - 15 % Final   . MPV 03/23/2015 9.5  9.4 - 12.3 fL Final   . Neutrophils 03/23/2015 54  None % Final   . Lymphocytes Automated 03/23/2015 38  None % Final   . Monocytes 03/23/2015 4   None % Final   . Eosinophils Automated 03/23/2015 3  None % Final   . Basophils Automated 03/23/2015 1  None % Final   . Immature Granulocyte 03/23/2015 0  None % Final   . Nucleated RBC 03/23/2015 0  0 - 1 /100 WBC Final   . Neutrophils Absolute 03/23/2015 1.61* 1.80 - 8.10 x10 3/uL Final   . Abs Lymph Automated 03/23/2015 1.12  0.50 - 4.40 x10 3/uL Final   . Abs Mono Automated 03/23/2015 0.12  0.00 - 1.20 x10 3/uL Final   . Abs Eos Automated 03/23/2015 0.08  0.00 - 0.70 x10 3/uL Final   . Absolute Baso Automated 03/23/2015 0.02  0.00 - 0.20 x10 3/uL Final   . Absolute Immature Granulocyte 03/23/2015 0.01  0 x10 3/uL Final         Rads:   No results found.    Assessment:   Pancytopenia. With counts fluctuating over time. His platelet counts are lower today can be from his URI. 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 Cytopenias can be from marrow suppression from alcohol  H/O Laryngeal cancer treated with Radiation therapy 2004  Plan:   Will order a flow cytometry on his peripheral blood  Discussed with patient that he will not need any specific treatment  If his platelet counts drop below 20,000 or he becomes symptomatic with bruising, bleeding, may consider treating him for his low platelets  Patient will follow back in 3 months time

## 2015-06-23 LAB — LEUKEMIA/LYMPHOMA EVALUATION PANEL
Number of Markers:: 22
Viability: 98 %

## 2015-07-27 ENCOUNTER — Encounter (HOSPITAL_BASED_OUTPATIENT_CLINIC_OR_DEPARTMENT_OTHER): Payer: Self-pay

## 2015-08-16 ENCOUNTER — Telehealth: Payer: Medicare Other

## 2015-08-16 NOTE — Pre-Procedure Instructions (Addendum)
DOS Aug 22 828 ARRIVE 0700 TAKE PROTONIX DOS . PT SAID HE WILL TAKE OTHER MEDS POST OP AT HOME IE LIPITOR.   NPO AFTER 2300 MAY 25, CL FLUIDS TILL 0300  PT SAID HIS WIFE IS GOING OUT OF TOWN AND HE WAS GOING TO DRIVE HIM SELF HOME. PT REMINDED THIS WAS NOT NOT ALLOWED AND HE WOULD NEED TO FIND SOMEONE TO DRIVE HIM HOME AND CK ON HIM  X24 HS POST OP -HE SAID HE WOULD.

## 2015-08-20 NOTE — Anesthesia Preprocedure Evaluation (Addendum)
Anesthesia Evaluation    AIRWAY    Mallampati: III    TM distance: >3 FB  Neck ROM: full  Mouth Opening:full   CARDIOVASCULAR    regular and normal       DENTAL    no notable dental hx     PULMONARY    clear to auscultation     OTHER FINDINGS                      Anesthesia Plan    ASA 3     general               (Interstitial lund dz, no home O2  HTN  GERD/HH  Pancytopenia  Recent medical eval on chart, stable  ECG: NSR, T-wave inversions unchanged from previous  Labs WNL except anemia/thrombocytopenia)      intravenous induction   Detailed anesthesia plan: general IV            informed consent obtained    Plan discussed with CRNA.    ECG reviewed  pertinent labs reviewed

## 2015-08-23 ENCOUNTER — Ambulatory Visit
Admission: RE | Admit: 2015-08-23 | Discharge: 2015-08-23 | Disposition: A | Discharge status: Home / Self Care | Payer: Medicare Other | Source: Ambulatory Visit | Attending: Gastroenterology | Admitting: Gastroenterology

## 2015-08-23 ENCOUNTER — Ambulatory Visit: Payer: Medicare Other | Admitting: Anesthesiology

## 2015-08-23 ENCOUNTER — Encounter
Admission: RE | Disposition: A | Discharge status: Home / Self Care | Payer: Self-pay | Source: Ambulatory Visit | Attending: Gastroenterology

## 2015-08-23 ENCOUNTER — Ambulatory Visit: Payer: Medicare Other | Admitting: Gastroenterology

## 2015-08-23 ENCOUNTER — Ambulatory Visit: Payer: Self-pay

## 2015-08-23 DIAGNOSIS — K2270 Barrett's esophagus without dysplasia: Secondary | ICD-10-CM | POA: Insufficient documentation

## 2015-08-23 DIAGNOSIS — I10 Essential (primary) hypertension: Secondary | ICD-10-CM | POA: Insufficient documentation

## 2015-08-23 DIAGNOSIS — Z8521 Personal history of malignant neoplasm of larynx: Secondary | ICD-10-CM | POA: Insufficient documentation

## 2015-08-23 DIAGNOSIS — K449 Diaphragmatic hernia without obstruction or gangrene: Secondary | ICD-10-CM | POA: Insufficient documentation

## 2015-08-23 DIAGNOSIS — Z923 Personal history of irradiation: Secondary | ICD-10-CM | POA: Insufficient documentation

## 2015-08-23 DIAGNOSIS — K2950 Unspecified chronic gastritis without bleeding: Secondary | ICD-10-CM | POA: Insufficient documentation

## 2015-08-23 DIAGNOSIS — E785 Hyperlipidemia, unspecified: Secondary | ICD-10-CM | POA: Insufficient documentation

## 2015-08-23 DIAGNOSIS — K219 Gastro-esophageal reflux disease without esophagitis: Secondary | ICD-10-CM | POA: Insufficient documentation

## 2015-08-23 DIAGNOSIS — K2980 Duodenitis without bleeding: Secondary | ICD-10-CM | POA: Insufficient documentation

## 2015-08-23 DIAGNOSIS — J849 Interstitial pulmonary disease, unspecified: Secondary | ICD-10-CM | POA: Insufficient documentation

## 2015-08-23 HISTORY — PX: EGD: SHX3789

## 2015-08-23 SURGERY — DONT USE, USE 1095-ESOPHAGOGASTRODUODENOSCOPY (EGD), DIAGNOSTIC
Anesthesia: Anesthesia General | Wound class: Clean Contaminated

## 2015-08-23 MED ORDER — LIDOCAINE HCL (PF) 2 % IJ SOLN
INTRAMUSCULAR | Status: AC
Start: 2015-08-23 — End: ?
  Filled 2015-08-23: qty 10

## 2015-08-23 MED ORDER — PROPOFOL 10 MG/ML IV EMUL (WRAP)
INTRAVENOUS | Status: AC
Start: 2015-08-23 — End: ?
  Filled 2015-08-23: qty 40

## 2015-08-23 MED ORDER — LIDOCAINE HCL 2 % IJ SOLN
INTRAMUSCULAR | Status: DC | PRN
Start: 2015-08-23 — End: 2015-08-23
  Administered 2015-08-23 (×2): 100 mg

## 2015-08-23 MED ORDER — LACTATED RINGERS IV SOLN
INTRAVENOUS | Status: DC
Start: 2015-08-23 — End: 2015-08-23
  Administered 2015-08-23: 1000 mL via INTRAVENOUS

## 2015-08-23 MED ORDER — PROPOFOL INFUSION 10 MG/ML
INTRAVENOUS | Status: DC | PRN
Start: 2015-08-23 — End: 2015-08-23
  Administered 2015-08-23: 60 ug/kg/min via INTRAVENOUS

## 2015-08-23 MED ORDER — PROPOFOL INFUSION 10 MG/ML
INTRAVENOUS | Status: DC | PRN
Start: 2015-08-23 — End: 2015-08-23
  Administered 2015-08-23: 100 mg via INTRAVENOUS
  Administered 2015-08-23: 50 mg via INTRAVENOUS

## 2015-08-23 MED ORDER — LIDOCAINE 1% BUFFERED - CNR/OUTSOURCED
0.3000 mL | Freq: Once | INTRAMUSCULAR | Status: AC
Start: 2015-08-23 — End: 2015-08-23
  Administered 2015-08-23: 0.3 mL via INTRADERMAL

## 2015-08-23 SURGICAL SUPPLY — 46 items
BASIN EME PLS 700ML LF GRAD TRNLU PGMNT (Patient Supply) ×2
BASIN EMESIS 700 ML GRADUATED TRANSLUCENT PIGMENT FREE PLASTIC (Patient Supply) ×1 IMPLANT
CONTAINER HISTOLOGY 60 ML 30 ML GRADUATE LEAK RESISTANT O RING PREFILL (Procedure Accessories) IMPLANT
FORCEPS BIOPSY L240 CM +2.8 MM HOT OD2.2 (Endoscopic Supplies)
FORCEPS BIOPSY L240 CM +2.8 MM HOT OD2.2 MM RADIAL JAW (Endoscopic Supplies) IMPLANT
FORCEPS BIOPSY L240 CM JUMBO MICROMESH (Instrument)
FORCEPS BIOPSY L240 CM JUMBO MICROMESH TEETH STREAMLINE CATHETER (Instrument) IMPLANT
FORCEPS BIOPSY L240 CM LARGE CAPACITY (Instrument)
FORCEPS BIOPSY L240 CM MICROMESH TEETH STREAMLINE CATHETER NEEDLE (Instrument) IMPLANT
FORCEPS BIOPSY L240 CM STANDARD CAPACITY (Instrument) ×1
FORCEPS BX +2.8MM RJ 4 2.2MM 240CM HOT (Endoscopic Supplies)
FORCEPS BX SS JMB RJ 4 2.8MM 240CM STRL (Instrument)
FORCEPS BX SS LG CPC RJ 4 2.4MM 240CM (Instrument)
FORCEPS BX STD CPC RJ 4 2.2MM 240CM STRL (Instrument) ×1
FORCEPS RADIAL JAW 3 W/ NEEDLE (Endoscopic Supplies) IMPLANT
GOWN ISL SMS XL LF HKLP NK KNIT CUF BLU (Patient Supply) ×2
GOWN ISO YELLOW UNIVERSAL (Gown) ×2 IMPLANT
GOWN ISOLATION XL HOOK LOOP NECK KNIT (Patient Supply) IMPLANT
KIT SMARTPREP APC 30 30ML (Kits) IMPLANT
LINER SCT 1500CC THNWL MDVC FLX ADV LF (Suction) IMPLANT
MARKER ENDOSCOPIC PERMANENT INDICATION (Syringes, Needles) ×1
MARKER ENDOSCOPIC PERMANENT INDICATION DARK SYRINGE SPOT EX 5 ML (Syringes, Needles) ×1 IMPLANT
MARKER ESCP 5ML SPOT EX PERM INDCT DRK (Syringes, Needles) ×1
NEEDLE INJC DISP NM LOWER GI (Needles) IMPLANT
NET SPEC RTRVL STD RTHNT 2.5MM 230CM LF (Urology Supply)
NET SPECIMEN RETRIEVAL L230 CM STANDARD (Urology Supply)
NET SPECIMEN RETRIEVAL L230 CM STANDARD SHEATH OD2.5 MM L6 CM X W3 CM (Urology Supply) IMPLANT
PAD ELECTROSRG GRND REM W CRD (Procedure Accessories) IMPLANT
SNARE ESCP MED OVL CPTFLX 2.4MM 240CM (Endoscopic Supplies)
SNARE ESCP MIC CPTVTR 13MM 240IN STRL (GE Lab Supplies)
SNARE MD OVAL 240CM 2.4 MM CPTFLX LP FLXBL ENDOSCOPIC POLYPECTOMY 27MM (Endoscopic Supplies) IMPLANT
SNARE MEDIUM OVAL L240 CM OD2.4 MM (Endoscopic Supplies)
SNARE SMALL HEXAGON CAPTIVATOR STIFF ENDOSCOPIC POLYPECTOMY (GE Lab Supplies) IMPLANT
SOL FORMALIN 10% PREFILL 30ML (Procedure Accessories) ×6
SOLN LUBRICATING JELLY 4.25OZ (Irrigation Solutions) ×2 IMPLANT
SPEEDBAND SUPERVIEW SUPER (Endoscopic Supplies) ×2 IMPLANT
SPONGE GAUZE L4 IN X W4 IN 4 PLY HIGH (Sponge) ×1
SPONGE GAUZE L4 IN X W4 IN 4 PLY NONWOVEN LINT FREE CURITY RAYON (Sponge) ×1 IMPLANT
SPONGE GZE RYN PLSTR CRTY 4X4IN LF NS 4 (Sponge) ×1
SYRINGE 50 ML GRADUATE NONPYROGENIC DEHP (Syringes, Needles) ×1
SYRINGE 50 ML GRADUATE NONPYROGENIC DEHP FREE PVC FREE BD MEDICAL (Syringes, Needles) ×1 IMPLANT
SYRINGE MED 50ML LF STRL GRAD N-PYRG (Syringes, Needles) ×1
TRAP SPECIMEN LF (Procedure Accessories) IMPLANT
VALVE AIR/WATER DEFENDO KIT SUCTION (Suction) ×1
VALVE AIR/WATER DEFENDO KIT SUCTION BUTTON BIOPSY (Suction) IMPLANT
VALVE DEFENDO SUCTION AIR/WTR (Suction) ×1

## 2015-08-23 NOTE — Transfer of Care (Signed)
Anesthesia Transfer of Care Note    Patient: Brent [name]    Procedures performed: Procedure(s):  EGD    Anesthesia type: General TIVA    Patient location:Phase II PACU    Last vitals:   Filed Vitals:    08/23/15 0922   BP: 116/72   Pulse: 75   Temp: 36.5 C (97.7 F), 12 94% RA       Post pain: Patient not complaining of pain, continue current therapy      Mental Status:awake    Respiratory Function: tolerating room air    Cardiovascular: stable    Nausea/Vomiting: patient not complaining of nausea or vomiting    Hydration Status: adequate    Post assessment: no apparent anesthetic complications    Signed byManus Rudd  08/23/2015 9:21 AM

## 2015-08-23 NOTE — Anesthesia Postprocedure Evaluation (Signed)
Anesthesia Post Evaluation    Patient: Brent [name]    Procedure(s):  EGD    Anesthesia type: general    Last Vitals:   Filed Vitals:    08/23/15 0801   BP: 111/63   Pulse: 54   Temp: 36.5 C (97.7 F)       Patient Location: Phase II PACU      Post Pain: Patient not complaining of pain, continue current therapy    Mental Status: awake    Respiratory Function: tolerating room air    Cardiovascular: stable    Nausea/Vomiting: patient not complaining of nausea or vomiting    Hydration Status: adequate    Post Assessment: no apparent anesthetic complications          Anesthesia Qualified Clinical Data Registry    Central Line      CVC insertion : NO                                               Perioperative temperature management      General/neuraxial anesthesia > or = 60 minutes (excluding CABG) : NO                                Administration of antibiotic prophylaxis      Age > or = 18, with IV access, with surgical procedure for which antibiotic prophylaxis indicated, and not on chronic antibiotics : NO                  Medication Administration      Ordering or administration of drug inconsistent with intended drug, dose, delivery or timing : NO      Dental loss/damage      Dental injury with administration of anesthesia : NO      Difficult intubation due to unrecognized difficult airway        Elective airway procedure including but not limited to: tracheostomy, fiberoptic bronchoscopy, rigid bronchoscopy; jet ventilation; or elective use of a device to facilitate airway management such as a Glidescope : NO                > Unanticipated difficult intubation post pre-evaluation : NO      Aspiration of gastric contents        Aspiration of gastric contents : NO                    Surgical fire        Procedure requiring electrocautery/laser : YES                > Ignition/burning in invasive procedure location : NO      Immediate perioperative cardiac arrest        Cardiac arrest in OR or PACU : NO                     Unplanned hospital admission        Unplanned hospital admission for initially intended outpatient anesthesia service : NO      Unplanned ICU admission        Unplanned ICU admission related to anesthesia occurring within 24 hours of induction or start of MAC : NO      Surgical case cancellation  Cancellation of procedure after care already started by anesthesia care team : NO      Post-anesthesia transfer of care checklist/protocol to PACU        Transfer from OR to PACU upon case conclusion : YES              > Use of PACU transfer checklist/protocol : YES     (Includes the key elements of: patient identification, responsible practitioner identification (PACU nurse or advanced practitioner), discussion of pertinent history and procedure course, intraoperative anesthetic management, post-procedure plans, acknowledgement/questions)    Post-anesthesia transfer of care checklist/protocol to ICU        Transfer from OR to ICU upon case conclusion : NO                    Post-operative nausea/vomiting risk protocol        Post-operative nausea/vomiting risk protocol : NO  Patient > or = 18 with care initiated by anesthesia team that has a risk factor screen for post-op nausea/vomiting (Includes male, hx PONV, or motion sickness, non-smoker, intended opioid administration for post-op analgesia.)    Anaphylaxis        Anaphylaxis during anesthesia services : NO    (Inclusive of any suspected transfusion reaction in association with blood-bank confirmed blood product incompatibility)              Dayton Martes, 08/23/2015 9:06 AM

## 2015-08-23 NOTE — H&P (Signed)
Updated H and P scanned in  No changes in exam or history

## 2015-08-23 NOTE — Discharge Instructions (Signed)
Endoscopy Discharge Instructions    General Instructions:  1. Following sedation, your judgement, perception, and coordination are considered impaired. Even though you may feel awake and alert, you are considered legally intoxicated. Therefore, until the next morning;   Do not Drive   Do not operate appliances or equipment that requires reaction time (e.g. Stove, electrical tools, machinery)   Do not sign legal documents or be involved in important decisions.   Do not smoke if alone   Do not drink alcoholic beverages   Go directly home and rest for several hours before resuming your routine activities.   It is highly recommended to have a responsible adult stay with you for the next 24 hours    2. Tenderness, swelling or pain may occur at the IV site where you received sedation. If you experience this, apply warm soaks to the area. Notify your physician if this persists.    Instructions Specific To Procedures - Report To Physician Any Of The Following:    Upper Endoscopy, ERCP, Dilations   1. Pain in Chest   2. Nausea/vomitting   3. Fevers/Chills within 24 hours after procedure. Temp>101deg F   4. Severe and persistent abdominal pain and bloating              5. Chest Pain or difficulty breathing     In Addition:   Mild throat soreness may follow this procedure. Warm salt water gargling or lozenges of your choice will most likely relieve your discomfort or cold drinks and popsicles.      Biopsies were taken, DO NOT take aspirin or aspirin containing products (e.g. Anacin, Alka Seltzer, Bufferin, Etc.) or non-steroidal anti-inflammatory drugs (e.g. Advil, Motrin, etc.) for 7 days unless otherwise advised by doctor. Tylenol or extra Strength Tylenol is permitted.      Additional Discharge Instructions  Your diet after the procedure: begin with clear liquid diet after the procedure.  Advance your diet as tolerated.  May be able to eat a regular diet by tonight.  Resume all medications unless otherwise  indicated.  Special Instructions: None  Prescriptions given: None        If you have questions or problems contact your MD immediately. If you need immediate attention, call your MD, 911 and/or go to nearest emergency room.

## 2015-08-23 NOTE — PACU (Signed)
PACU general surgery discharge criteria note:      Patient demonstrates:  [x] Ability to void prior to discharge  [x] Ability to tolerate PO intake   [x] Ambulation to the bathroom  [x] Ability to transfer from Stretcher to chair  [x] Ambulation in hallway  [x] Lack of nausea or vomiting prior to discharge  [x] Adequate pain control  [] Home care of incisions/wounds reviewed with patient/family  [x] Dressings and wound sites without active bleeding    Patient has received:  [x]Written discharge instructions specific to procedure prior to discharge  [x]Education on pain medication regimen once home.

## 2015-09-20 ENCOUNTER — Ambulatory Visit (HOSPITAL_BASED_OUTPATIENT_CLINIC_OR_DEPARTMENT_OTHER): Payer: Medicare Other | Admitting: Hematology & Oncology

## 2015-09-25 ENCOUNTER — Encounter (HOSPITAL_BASED_OUTPATIENT_CLINIC_OR_DEPARTMENT_OTHER): Payer: Self-pay | Admitting: Hematology & Oncology

## 2015-09-25 ENCOUNTER — Ambulatory Visit (FREE_STANDING_LABORATORY_FACILITY): Payer: Medicare Other | Admitting: Hematology & Oncology

## 2015-09-25 VITALS — BP 105/66 | HR 59 | Temp 98.1°F | Resp 18 | Ht 68.0 in | Wt 236.6 lb

## 2015-09-25 DIAGNOSIS — D696 Thrombocytopenia, unspecified: Secondary | ICD-10-CM

## 2015-09-25 LAB — CBC AND DIFFERENTIAL
Absolute NRBC: 0 10*3/uL
Basophils Absolute Automated: 0.01 10*3/uL (ref 0.00–0.20)
Basophils Automated: 0.4 %
Eosinophils Absolute Automated: 0.05 10*3/uL (ref 0.00–0.70)
Eosinophils Automated: 2.2 %
Hematocrit: 32 % — ABNORMAL LOW (ref 42.0–52.0)
Hgb: 11.2 g/dL — ABNORMAL LOW (ref 13.0–17.0)
Immature Granulocytes Absolute: 0.01 10*3/uL
Immature Granulocytes: 0.4 %
Lymphocytes Absolute Automated: 1.05 10*3/uL (ref 0.50–4.40)
Lymphocytes Automated: 45.5 %
MCH: 35.1 pg — ABNORMAL HIGH (ref 28.0–32.0)
MCHC: 35 g/dL (ref 32.0–36.0)
MCV: 100.3 fL — ABNORMAL HIGH (ref 80.0–100.0)
MPV: 9.3 fL — ABNORMAL LOW (ref 9.4–12.3)
Monocytes Absolute Automated: 0.08 10*3/uL (ref 0.00–1.20)
Monocytes: 3.5 %
Neutrophils Absolute: 1.11 10*3/uL — ABNORMAL LOW (ref 1.80–8.10)
Neutrophils: 48 %
Nucleated RBC: 0 /100 WBC (ref 0.0–1.0)
Platelets: 63 10*3/uL — ABNORMAL LOW (ref 140–400)
RBC: 3.19 10*6/uL — ABNORMAL LOW (ref 4.70–6.00)
RDW: 14 % (ref 12–15)
WBC: 2.31 10*3/uL — ABNORMAL LOW (ref 3.50–10.80)

## 2015-09-25 LAB — COMPREHENSIVE METABOLIC PANEL
ALT: 21 U/L (ref 0–55)
AST (SGOT): 19 U/L (ref 5–34)
Albumin/Globulin Ratio: 1.5 (ref 0.9–2.2)
Albumin: 4.1 g/dL (ref 3.5–5.0)
Alkaline Phosphatase: 52 U/L (ref 38–106)
BUN: 22 mg/dL (ref 9.0–28.0)
Bilirubin, Total: 0.6 mg/dL (ref 0.1–1.2)
CO2: 26 mEq/L (ref 21–30)
Calcium: 9 mg/dL (ref 7.9–10.2)
Chloride: 108 mEq/L (ref 100–111)
Creatinine: 1.1 mg/dL (ref 0.5–1.5)
Globulin: 2.8 g/dL (ref 2.0–3.7)
Glucose: 83 mg/dL (ref 70–100)
Potassium: 4 mEq/L (ref 3.5–5.1)
Protein, Total: 6.9 g/dL (ref 6.0–8.3)
Sodium: 142 mEq/L (ref 135–146)

## 2015-09-25 LAB — GFR: EGFR: >60

## 2015-09-25 LAB — HEMOLYSIS INDEX: Hemolysis Index: 5 (ref 0–18)

## 2015-09-25 NOTE — Progress Notes (Signed)
[facility] Office Visit Note      Patient Name: Brent [name], Brent [name]      Interval history:   Mr. Mera is here for a follow up visit. He has no complaints. Physically active. He denies bruising bleeding fatigue or night sweats    Medications:     No outpatient prescriptions have been marked as taking for the 09/25/15 encounter (Office Visit) with Acquanetta Sit, MD.         Past History:   No Known Allergies    Past Medical History   Diagnosis Date   . Hyperlipidemia    . On home O2 09/2010     @night  2 liters, 88-96% AFTER PNUEMONIA. FOR A FEW MTS NONE SINCE   . Chronic gout      LAST FLARE UP 206, NO MEDS X 1 YR FOR IT   . Hypertensive disorder      CONTROLLED    . Hiatal hernia    . Reflux esophagitis    . Pulmonary embolus 2013     TREATED IN [facility] X 3 MONTHS   . Pneumonia 2013   . Interstitial lung disease DX 2013   . Disorder of prostate      BPH -NO MEDS   . GERD (gastroesophageal reflux disease)      MED EFFECTIVE -TAKES PROTONIX   . 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 Anemia      PANCYTOPENIA-SEE DR [facility] NOTE IN [facility] 06-25-2015   . Clotting disorder      04/2011, PE TOOK COUMADIN X 3 MTS   . Arthritis      RIGHT KNEE-CORTISONE SHOT 04/03/15, LT TOTAL KNEE 2011, ACL RT KNEE 1988   . Fractures      RT TIBI/FIB-CLOSED REDUCTION 1973,    . Hard to intubate      TROUBLE ONCE AFTER THROAT CANCER 2003, HAS BEEN INTUBATED WITHOUT DIFF SINCE         Review of Systems:   A comprehensive review of systems was:   General ROS: positive for - minimal fatigue  negative for - chills or fever  Psychological ROS: negative for - anxiety or depression  Hematological and Lymphatic ROS: positive for - blood clots  negative for - bleeding problems, night sweats or swollen lymph nodes  Respiratory ROS: positive for - cough, shortness of breath and tachypnea  Cardiovascular ROS: no chest pain or dyspnea  on exertion  Gastrointestinal ROS: no abdominal pain, change in bowel habits, or black or bloody stools  Genito-Urinary ROS: no dysuria, trouble voiding, or hematuria  Neurological ROS: negative for - bowel and bladder control changes, impaired coordination/balance or tremors      Physical Exam:   There were no vitals filed for this visit.    General appearance - alert, well appearing, and in no distress  Mental status - alert, oriented to person, place, and time  Eyes - pupils equal and reactive, extraocular eye movements intact  Ears - bilateral TM's and external ear canals normal  Mouth - mucous membranes moist, pharynx normal without lesions  Neck - supple, no significant adenopathy  Lymphatics - no palpable lymphadenopathy, no hepatosplenomegaly  Chest - clear to auscultation, no wheezes, rales or rhonchi, symmetric air entry  Heart - normal rate, regular rhythm, normal S1, S2, no murmurs, rubs, clicks  or gallops  Abdomen - soft, nontender, nondistended, no masses or organomegaly  Musculoskeletal - no joint tenderness, deformity or swelling  Extremities - peripheral pulses normal, no pedal edema, no clubbing or cyanosis          Labs:     No visits with results within 1 Day(s) from this visit.  Latest known visit with results is:    Office Visit on 06/21/2015   Component Date Value Ref Range Status   . WBC 06/21/2015 2.96* 3.50 - 10.80 x10 3/uL Final   . Hgb 06/21/2015 12.0* 13.0 - 17.0 g/dL Final   . Hematocrit 16/12/9602 35.0* 42.0 - 52.0 % Final   . Platelets 06/21/2015 66* 140 - 400 x10 3/uL Final   . RBC 06/21/2015 3.50* 4.70 - 6.00 x10 6/uL Final   . MCV 06/21/2015 100.0  80.0 - 100.0 fL Final   . MCH 06/21/2015 34.3* 28.0 - 32.0 pg Final   . MCHC 06/21/2015 34.3  32.0 - 36.0 g/dL Final   . RDW 54/11/8117 15  12 - 15 % Final   . MPV 06/21/2015 9.5  9.4 - 12.3 fL Final   . Neutrophils 06/21/2015 62  None % Final   . Lymphocytes Automated 06/21/2015 32  None % Final   . Monocytes 06/21/2015 4  None %  Final   . Eosinophils Automated 06/21/2015 1  None % Final   . Basophils Automated 06/21/2015 0  None % Final   . Immature Granulocyte 06/21/2015 0  None % Final   . Nucleated RBC 06/21/2015 0  0 - 1 /100 WBC Final   . Neutrophils Absolute 06/21/2015 1.84  1.80 - 8.10 x10 3/uL Final   . Abs Lymph Automated 06/21/2015 0.95  0.50 - 4.40 x10 3/uL Final   . Abs Mono Automated 06/21/2015 0.12  0.00 - 1.20 x10 3/uL Final   . Abs Eos Automated 06/21/2015 0.04  0.00 - 0.70 x10 3/uL Final   . Absolute Baso Automated 06/21/2015 0.00  0.00 - 0.20 x10 3/uL Final   . Absolute Immature Granulocyte 06/21/2015 0.01  0 x10 3/uL Final   . Glucose 06/21/2015 88  70 - 100 mg/dL Final    Comment: ADA guidelines for diabetes mellitus:  Fasting:  Equal to or greater than 126 mg/dL  Random:   Equal to or greater than 200 mg/dL     . BUN 06/21/2015 21.0  9.0 - 28.0 mg/dL Final   . Creatinine 14/78/2956 1.3  0.5 - 1.5 mg/dL Final   . Sodium 21/30/8657 142  135 - 146 mEq/L Final   . Potassium 06/21/2015 4.2  3.5 - 5.3 mEq/L Final   . Chloride 06/21/2015 105  100 - 111 mEq/L Final   . CO2 06/21/2015 26  21 - 30 mEq/L Final   . Calcium 06/21/2015 9.5  7.9 - 10.2 mg/dL Final   . Protein, Total 06/21/2015 7.3  6.0 - 8.3 g/dL Final   . Albumin 84/69/6295 4.2  3.5 - 5.0 g/dL Final   . AST (SGOT) 28/41/3244 15  5 - 34 U/L Final   . ALT 06/21/2015 20  0 - 55 U/L Final   . Alkaline Phosphatase 06/21/2015 64  38 - 106 U/L Final   . Bilirubin, Total 06/21/2015 1.0  0.1 - 1.2 mg/dL Final   . Globulin 04/02/7251 3.1  2.0 - 3.7 g/dL Final   . Albumin/Globulin Ratio 06/21/2015 1.4  0.9 - 2.2 Final   . Clinical Information: 06/21/2015 NOT PROVIDED  Final   . SPECIMEN TYPE: 06/21/2015 SEE BELOW   Final    PERIPHERAL BLOOD   . Viability 06/21/2015 98   Final   . Interpretation: 06/21/2015 SEE BELOW   Final    Comment: **NO ATYPICAL FLOW CYTOMETRIC FINDINGS SEEN**  Lymphocytes include polyclonal B cells, NK cells and  immunophenotypically normal CD4+ and CD8+  T cells in normal  proportions. No evidence of a clonal lymphoid expansion. Granulocytes  are immunophenotypically mature. Flow Cytometry reviewed by Cassell Clement, M.D.     . Sample Description: 06/21/2015 SEE BELOW   Final    Comment: The analyzed WBCs in the sample include 47% lymphocytes, 3% monocytes  and 50% granulocytes.     . Gating Strategy 06/21/2015 SEE BELOW   Final    Comment: Granulocytes and lymphocytes were selected for analysis based on CD45  staining intensity, forward scatter and side scatter.     . Number of Markers: 06/21/2015 22   Final   . Markers 06/21/2015 SEE BELOW   Final    Comment: Gate A - Granulocytes  Marker    Percentage  CD2          0  CD3          0  CD4          0  CD5          0  CD7          0  CD8          1  CD10         74  CD11c        99  CD13         98  CD19         0  CD19+CD5+    0  CD20         0  CD23         0  CD33         0  CD34         0  CD38         0  CD45         100  CD56+CD3-    0  CD64         26  CD117        0  HLA_DR       2  Kappa CD19+  0  Lambda CD19+ 0  K/L Ratio    NA  Gate B - Lymphocytes  Marker    Percentage  CD2          88  CD3          86  CD4          53  CD5          86  CD7          84  CD8          32  CD10         0  CD11c        6  CD13         2  CD19         5  CD19+CD5+    0  CD20         5  CD23         2  CD33         1  CD34  0  CD38         6  CD45         100  CD56+CD3-    3  CD64         2  CD117        0  HLA_DR       12  Kappa CD19+  2  Lambda CD19+ 2  K/L Ratio    1.00  If you have questions about this report, please  call [phone], ext. [phone]. The results of  this test should be interp                           reted in conjunction with  the other clinical and pathologic features of this  patient.  This test was developed and its analytical performance  characteristics have been determined by [employer], [place], [place]. It has  not been cleared or approved by the U.S. Food and  Drug  Administration. This assay has been validated pursuant  to the CLIA regulations and is used for clinical  purposes.  Test Performed by Clerance Lav,  [facility],  [address], [place], [place] [address]  Si Raider, M.D., Ph.D., Director of Laboratories  [phone], CLIA [id number]     . Hemolysis Index 06/21/2015 4  0 - 18 Final   . EGFR 06/21/2015 54.2   Final    Comment: Disease State Reference Ranges:    Chronic Kidney Disease; < 60 ml/min/1.73 sq.m    Kidney Failure; < 15 ml/min/1.73 sq.m    [Calculated using IDMS-Traceable MDRD equation (based on    gender, age and black vs. non-black race) recommended by    [employer] Kidney Disease Education Program. No data    available for non-white, non-black race.]  GFR estimates are unreliable in patients with:    Rapidly changing kidney function or recent dialysis,    extreme age, body size or body composition(obesity,    severe malnutrition). Abnormal muscle mass (limb    amputation, muscle wasting). In these patients,    alternative determinations of GFR should be obtained.           Rads:   No results found.    Assessment:   Pancytopenia. With counts fluctuating over time. His platelet counts are lower today can be from his URI. 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 Cytopenias can be from marrow suppression from alcohol  H/O Laryngeal cancer treated with Radiation therapy 2004  Plan:     Discussed with patient that he will not need any specific treatment  If his platelet counts drop below 20,000 or he becomes symptomatic with bruising, bleeding, may consider treating him for his low platelets  Patient will follow back in 3 months time

## 2015-12-27 ENCOUNTER — Emergency Department: Payer: Medicare Other

## 2015-12-27 ENCOUNTER — Inpatient Hospital Stay
Admission: AD | Admit: 2015-12-27 | Discharge: 2015-12-29 | DRG: 309 | Disposition: A | Discharge status: Home / Self Care | Payer: Medicare Other | Source: Other Acute Inpatient Hospital | Attending: Family Medicine | Admitting: Family Medicine

## 2015-12-27 ENCOUNTER — Inpatient Hospital Stay: Payer: Medicare Other | Admitting: Internal Medicine

## 2015-12-27 ENCOUNTER — Emergency Department
Admission: EM | Admit: 2015-12-27 | Discharge: 2015-12-27 | Disposition: A | Discharge status: Other Acute Inpatient Hospital | Payer: Medicare Other | Attending: Emergency Medicine | Admitting: Emergency Medicine

## 2015-12-27 DIAGNOSIS — I4891 Unspecified atrial fibrillation: Secondary | ICD-10-CM | POA: Insufficient documentation

## 2015-12-27 DIAGNOSIS — Z85828 Personal history of other malignant neoplasm of skin: Secondary | ICD-10-CM

## 2015-12-27 DIAGNOSIS — M1A9XX0 Chronic gout, unspecified, without tophus (tophi): Secondary | ICD-10-CM | POA: Diagnosis present

## 2015-12-27 DIAGNOSIS — E785 Hyperlipidemia, unspecified: Secondary | ICD-10-CM | POA: Diagnosis present

## 2015-12-27 DIAGNOSIS — Z87891 Personal history of nicotine dependence: Secondary | ICD-10-CM

## 2015-12-27 DIAGNOSIS — Z8521 Personal history of malignant neoplasm of larynx: Secondary | ICD-10-CM

## 2015-12-27 DIAGNOSIS — K219 Gastro-esophageal reflux disease without esophagitis: Secondary | ICD-10-CM | POA: Insufficient documentation

## 2015-12-27 DIAGNOSIS — J849 Interstitial pulmonary disease, unspecified: Secondary | ICD-10-CM | POA: Diagnosis present

## 2015-12-27 DIAGNOSIS — Z9981 Dependence on supplemental oxygen: Secondary | ICD-10-CM

## 2015-12-27 DIAGNOSIS — I10 Essential (primary) hypertension: Secondary | ICD-10-CM | POA: Diagnosis present

## 2015-12-27 DIAGNOSIS — R0609 Other forms of dyspnea: Secondary | ICD-10-CM

## 2015-12-27 DIAGNOSIS — I499 Cardiac arrhythmia, unspecified: Secondary | ICD-10-CM

## 2015-12-27 DIAGNOSIS — I714 Abdominal aortic aneurysm, without rupture, unspecified: Secondary | ICD-10-CM

## 2015-12-27 DIAGNOSIS — D61818 Other pancytopenia: Secondary | ICD-10-CM | POA: Insufficient documentation

## 2015-12-27 DIAGNOSIS — Z86711 Personal history of pulmonary embolism: Secondary | ICD-10-CM | POA: Insufficient documentation

## 2015-12-27 DIAGNOSIS — K5900 Constipation, unspecified: Secondary | ICD-10-CM | POA: Diagnosis not present

## 2015-12-27 DIAGNOSIS — Z8679 Personal history of other diseases of the circulatory system: Secondary | ICD-10-CM

## 2015-12-27 DIAGNOSIS — N400 Enlarged prostate without lower urinary tract symptoms: Secondary | ICD-10-CM | POA: Diagnosis present

## 2015-12-27 DIAGNOSIS — Z923 Personal history of irradiation: Secondary | ICD-10-CM

## 2015-12-27 DIAGNOSIS — Z8709 Personal history of other diseases of the respiratory system: Secondary | ICD-10-CM | POA: Insufficient documentation

## 2015-12-27 DIAGNOSIS — I480 Paroxysmal atrial fibrillation: Principal | ICD-10-CM | POA: Diagnosis present

## 2015-12-27 DIAGNOSIS — Z8589 Personal history of malignant neoplasm of other organs and systems: Secondary | ICD-10-CM | POA: Insufficient documentation

## 2015-12-27 DIAGNOSIS — Z23 Encounter for immunization: Secondary | ICD-10-CM

## 2015-12-27 DIAGNOSIS — M1711 Unilateral primary osteoarthritis, right knee: Secondary | ICD-10-CM | POA: Diagnosis present

## 2015-12-27 DIAGNOSIS — D1803 Hemangioma of intra-abdominal structures: Secondary | ICD-10-CM | POA: Diagnosis present

## 2015-12-27 HISTORY — DX: Cardiac arrhythmia, unspecified: I49.9

## 2015-12-27 LAB — COMPREHENSIVE METABOLIC PANEL
ALT: 23 U/L (ref 0–55)
AST (SGOT): 18 U/L (ref 5–34)
Albumin/Globulin Ratio: 1.5 (ref 0.9–2.2)
Albumin: 4.4 g/dL (ref 3.5–5.0)
Alkaline Phosphatase: 48 U/L (ref 38–106)
Anion Gap: 13 (ref 5.0–15.0)
BUN: 29.8 mg/dL — ABNORMAL HIGH (ref 9.0–28.0)
Bilirubin, Total: 1.1 mg/dL (ref 0.2–1.2)
CO2: 22 mEq/L (ref 22–29)
Calcium: 9.9 mg/dL (ref 7.9–10.2)
Chloride: 102 mEq/L (ref 100–111)
Creatinine: 1.4 mg/dL — ABNORMAL HIGH (ref 0.7–1.3)
Globulin: 2.9 g/dL (ref 2.0–3.6)
Glucose: 103 mg/dL — ABNORMAL HIGH (ref 70–100)
Potassium: 4 mEq/L (ref 3.5–5.1)
Protein, Total: 7.3 g/dL (ref 6.0–8.3)
Sodium: 137 mEq/L (ref 136–145)

## 2015-12-27 LAB — CBC AND DIFFERENTIAL
Absolute NRBC: 0 10*3/uL
Basophils Absolute Automated: 0 10*3/uL (ref 0.00–0.20)
Basophils Automated: 0 %
Eosinophils Absolute Automated: 0 10*3/uL (ref 0.00–0.70)
Eosinophils Automated: 0 %
Hematocrit: 33.2 % — ABNORMAL LOW (ref 42.0–52.0)
Hgb: 11.2 g/dL — ABNORMAL LOW (ref 13.0–17.0)
Immature Granulocytes Absolute: 0 10*3/uL
Immature Granulocytes: 0 %
Lymphocytes Absolute Automated: 0.82 10*3/uL (ref 0.50–4.40)
Lymphocytes Automated: 32.3 %
MCH: 33.8 pg — ABNORMAL HIGH (ref 28.0–32.0)
MCHC: 33.7 g/dL (ref 32.0–36.0)
MCV: 100.3 fL — ABNORMAL HIGH (ref 80.0–100.0)
MPV: 9.9 fL (ref 9.4–12.3)
Monocytes Absolute Automated: 0.12 10*3/uL (ref 0.00–1.20)
Monocytes: 4.7 %
Neutrophils Absolute: 1.6 10*3/uL — ABNORMAL LOW (ref 1.80–8.10)
Neutrophils: 63 %
Nucleated RBC: 0 /100 WBC (ref 0.0–1.0)
Platelets: 84 10*3/uL — ABNORMAL LOW (ref 140–400)
RBC: 3.31 10*6/uL — ABNORMAL LOW (ref 4.70–6.00)
RDW: 15 % (ref 12–15)
WBC: 2.54 10*3/uL — ABNORMAL LOW (ref 3.50–10.80)

## 2015-12-27 LAB — CELL MORPHOLOGY
Cell Morphology: ABNORMAL — AB
Platelet Estimate: DECREASED — AB

## 2015-12-27 LAB — TROPONIN I: Troponin I: 0.01 ng/mL (ref 0.00–0.09)

## 2015-12-27 LAB — B-TYPE NATRIURETIC PEPTIDE: B-Natriuretic Peptide: 84.8 pg/mL (ref 0.0–100.0)

## 2015-12-27 LAB — IHS D-DIMER: D-Dimer: 0.87 ug/mL FEU — ABNORMAL HIGH (ref 0.00–0.51)

## 2015-12-27 LAB — PT AND APTT
PT INR: 1.1
PT: 14.1 s (ref 12.6–15.0)
PTT: 32 s (ref 23–37)

## 2015-12-27 LAB — GFR: EGFR: 49.7

## 2015-12-27 MED ORDER — IODIXANOL 320 MG/ML IV SOLN
100.0000 mL | Freq: Once | INTRAVENOUS | Status: AC | PRN
Start: 2015-12-27 — End: 2015-12-27
  Administered 2015-12-27: 100 mL via INTRAVENOUS

## 2015-12-27 MED ORDER — SODIUM CHLORIDE 0.9 % IV BOLUS
500.0000 mL | Freq: Once | INTRAVENOUS | Status: AC
Start: 2015-12-27 — End: 2015-12-27
  Administered 2015-12-27: 500 mL via INTRAVENOUS

## 2015-12-27 MED ORDER — FENTANYL CITRATE (PF) 50 MCG/ML IJ SOLN (WRAP)
25.0000 ug | Freq: Once | INTRAMUSCULAR | Status: DC
Start: 2015-12-27 — End: 2015-12-27

## 2015-12-27 MED ORDER — INFLUENZA VAC SPLIT HIGH-DOSE 0.5 ML IM SUSY
0.5000 mL | PREFILLED_SYRINGE | Freq: Once | INTRAMUSCULAR | Status: AC
Start: 2015-12-28 — End: 2015-12-28
  Administered 2015-12-28: 0.5 mL via INTRAMUSCULAR
  Filled 2015-12-27: qty 0.5

## 2015-12-27 MED ORDER — DILTIAZEM HCL 5 MG/ML IV SOLN (WRAP)
10.0000 mg | Freq: Once | INTRAVENOUS | Status: AC
Start: 2015-12-27 — End: 2015-12-27
  Administered 2015-12-27: 10 mg via INTRAVENOUS
  Filled 2015-12-27: qty 5

## 2015-12-27 NOTE — ED Triage Notes (Signed)
Pt denies weapons on premises, aware of hospital policy

## 2015-12-27 NOTE — ED Notes (Signed)
Report called to [place], Cardiac [facility] RN. Patient's VSS, HR 55-60s. Patient denies pain. Report given to PTS. 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

## 2015-12-27 NOTE — ED Provider Notes (Signed)
Physician/Midlevel provider first contact with patient: 12/27/15 1517         History     Chief Complaint   Patient presents with   . Chest Pain     HPI     States finished playing golf around noon and started to have substernal chest tightening, w/ back tightening. Started to radiate to his jaw. Does not take ASA daily. Reports no recent cardiac workup. Hx of HTN, hyperlipidemia. States pain improved, but still w/ discomfort    Past Medical History:   Diagnosis Date   . Anemia     PANCYTOPENIA-SEE DR [facility] NOTE IN [facility] 06-25-2015   . Arthritis     RIGHT KNEE-CORTISONE SHOT 04/03/15, LT TOTAL KNEE 2011, ACL RT KNEE 1988   . 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 Chronic gout     LAST FLARE UP 206, NO MEDS X 1 YR FOR IT   . Clotting disorder     04/2011, PE TOOK COUMADIN X 3 MTS   . Disorder of prostate     BPH -NO MEDS   . Fractures     RT TIBI/FIB-CLOSED REDUCTION 1973,    . GERD (gastroesophageal reflux disease)     MED EFFECTIVE -TAKES PROTONIX   . 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 Hiatal hernia    . Hyperlipidemia    . Hypertensive disorder     CONTROLLED    . Interstitial lung disease DX 2013   . 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 On home O2 09/2010    @night  2 liters, 88-96% AFTER PNUEMONIA. FOR A FEW MTS NONE SINCE   . Pneumonia 2013   . Pulmonary embolus 2013    TREATED IN [facility] X 3 MONTHS   . Reflux esophagitis        Past Surgical History:   Procedure Laterality Date   . BONE MARROW BIOPSY  03/2015    FOLLOWED BY DR [facility] HEMATOLOGIST   . EGD      X 2   . EGD N/A 08/23/2015    Procedure: EGD;  Surgeon: Trixie Dredge, MD;  Location: [place] ENDOSCOPY OR;  Service: Gastroenterology;  Laterality: N/A;   . FRACTURE SURGERY  1988    RT TIB/FIB CLOSED REDUCTION   . HERNIA REPAIR Right 2014    INGUINAL    . JOINT REPLACEMENT      Left TKR, 03/2009   . RT KNEE ACL  1988    . THORACOSCOPY, (VATS) Right 05/2011    RUL, RLL   . TONSILLECTOMY      CHILDHOOD        Family History   Problem Relation Age of Onset   . Aplastic anemia Mother    . Hypertension Father    . Ovarian cancer Neg Hx    . Lung cancer Neg Hx    . Rectal cancer Neg Hx    . Colon cancer Neg Hx    . Breast cancer Neg Hx    . Cancer Neg Hx        Social  Social History   Substance Use Topics   . Smoking status: Former Smoker     Packs/day: 1.50     Years: 20.00     Quit date: 04/17/1982   .  Smokeless tobacco: Never Used      Comment: CIGAR 5-6 TIMES PER YEAR   . Alcohol use 1.2 - 3.6 oz/week     2 - 6 Shots of liquor per week       .     No Known Allergies    Home Medications     Med List Status:  In Progress Set By: Valera Castle, RN at 12/27/2015  3:17 PM                atorvastatin (LIPITOR) 40 MG tablet     Take 20 mg by mouth every morning.        Cholecalciferol (VITAMIN D) 1000 UNIT tablet     Take 1,000 Units by mouth daily. UNSURE OF THE DOSE     lisinopril (PRINIVIL,ZESTRIL) 10 MG tablet     Take 20 mg by mouth every morning.        Multiple Vitamin (MULTIVITAMIN) tablet     Take 1 tablet by mouth daily.     pantoprazole (PROTONIX) 40 MG tablet     Take 40 mg by mouth every morning.        vitamin B-12 (CYANOCOBALAMIN) 500 MCG tablet     Take 500 mcg by mouth daily. UNSURE OF THE DOSE             Review of Systems    Physical Exam    BP: 106/67, Heart Rate: 88 (irregular ), Temp: 97.2 F (36.2 C), Resp Rate: 18, SpO2: 96 %, Weight: 99.8 kg    Physical Exam   Constitutional: He is oriented to person, place, and time. He appears well-developed.   HENT:   Head: Normocephalic and atraumatic.   Eyes: Pupils are equal, round, and reactive to light.   Neck: Normal range of motion.   Cardiovascular: Normal heart sounds and intact distal pulses.    Irregularly irregular,  tachycardic   Pulmonary/Chest: Effort normal and breath sounds normal. No respiratory distress.   Abdominal: Soft.   Musculoskeletal: Normal range of  motion. He exhibits no edema or deformity.   Neurological: He is alert and oriented to person, place, and time.   Skin: Skin is warm and dry. Capillary refill takes less than 2 seconds.         MDM and ED Course     ED Medication Orders     Start Ordered     Status Ordering Provider    12/27/15 1615 12/27/15 1614    Once     Route: Intravenous  Ordered Dose: 25 mcg     Discontinued Paulyne Mooty, [facility]    12/27/15 1532 12/27/15 1531  dilTIAZem (CARDIZEM) injection 10 mg  Once     Route: Intravenous  Ordered Dose: 10 mg     Last MAR action:  Given Kennedy Bucker [facility]    12/27/15 1532 12/27/15 1531  sodium chloride 0.9 % bolus 500 mL  Once     Route: Intravenous  Ordered Dose: 500 mL     Last MAR action:  Stopped Ramatoulaye Pack             MDM  Ddx: MI, PE, arrythmia,     ED Course        12:01 AM pt states CP improved. HR now 90 but still in afib after cardizem 10 mg. States pain is 2/10. Declines pain medications  Heart Score    Flowsheet Row Value   History  2   EKG  1  Risk Factors  1   Total (with age)  6   Onset of pain (time of START of last episode of chest pain)?  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 States will consult on pt when at [facility]    D/w Dr Albina Billet, accepts pt for transfer for further care    Results     Procedure Component Value Units Date/Time    B-type Natriuretic Peptide [[id number]] Collected:  12/27/15 1545    Specimen:  Blood Updated:  12/27/15 1630     B-Natriuretic Peptide 84.8 pg/mL     Cell MorpHology [[id number]]  (Abnormal) Collected:  12/27/15 1545     Updated:  12/27/15 1624     Cell Morphology: Abnormal (A)     Platelet Estimate Decreased (A)     Polychromasia Present (A)     Ovalocytes Present (A)    CBC with differential [[id number]]  (Abnormal) Collected:  12/27/15 1545    Specimen:  Blood from Blood Updated:  12/27/15 1623     WBC 2.54 (L) x10 3/uL       Hgb 11.2 (L) g/dL      Hematocrit 84.1 (L) %      Platelets 84 (L) x10 3/uL      RBC 3.31 (L) x10 6/uL      MCV 100.3 (H) fL      MCH 33.8 (H) pg      MCHC 33.7 g/dL      RDW 15 %      MPV 9.9 fL      Neutrophils 63.0 %      Lymphocytes Automated 32.3 %      Monocytes 4.7 %      Eosinophils Automated 0.0 %      Basophils Automated 0.0 %      Immature Granulocyte 0.0 %      Nucleated RBC 0.0 /100 WBC      Neutrophils Absolute 1.60 (L) x10 3/uL      Abs Lymph Automated 0.82 x10 3/uL      Abs Mono Automated 0.12 x10 3/uL      Abs Eos Automated 0.00 x10 3/uL      Absolute Baso Automated 0.00 x10 3/uL      Absolute Immature Granulocyte 0.00 x10 3/uL      Absolute NRBC 0.00 x10 3/uL     Troponin I [[id number]] Collected:  12/27/15 1545    Specimen:  Blood Updated:  12/27/15 1618     Troponin I 0.01 ng/mL     Comprehensive metabolic panel [[id number]]  (Abnormal) Collected:  12/27/15 1545    Specimen:  Blood Updated:  12/27/15 1618     Glucose 103 (H) mg/dL      BUN 32.2 (H) mg/dL      Creatinine 1.4 (H) mg/dL      Sodium 025 mEq/L      Potassium 4.0 mEq/L      Chloride 102 mEq/L      CO2 22 mEq/L      Calcium 9.9 mg/dL      Protein, Total 7.3 g/dL      Albumin 4.4 g/dL      AST (SGOT) 18 U/L      ALT 23 U/L      Alkaline Phosphatase 48 U/L      Bilirubin, Total 1.1  mg/dL      Globulin 2.9 g/dL      Albumin/Globulin Ratio 1.5     Anion Gap 13.0    GFR [[id number]] Collected:  12/27/15 1545     Updated:  12/27/15 1618     EGFR 49.7    PT/ APTT [[id number]] Collected:  12/27/15 1545     Updated:  12/27/15 1607     PT 14.1 sec      PT INR 1.1     PT Anticoag. Given Within 48 hrs. None     PTT 32 sec     D-Dimer [[id number]]  (Abnormal) Collected:  12/27/15 1545     Updated:  12/27/15 1607     D-Dimer 0.87 (H) ug/mL FEU         Xr Chest 2 Views    Result Date: 12/27/2015   No acute finding. Sallye Lat, MD 12/27/2015 4:04 PM     Ct Angiogram Aaa Chest Abdomen    Result Date: 12/27/2015  1. Focal outpouching distal abdominal aorta  to the left just above the bifurcation likely representing a saccular aneurysm or penetrating aortic ulcer. Just superiorly there is focal ectasia of distal abdominal aorta. Rest of aorta is of normal caliber. Ectasia bilateral common iliac arteries. 2. Mild opacity anterior right middle lobe nonspecific and may be infectious inflammatory or scarring. 3. Low density lesion right hepatic lobe not characterized. Suggestion of some peripheral enhancement and may represent hemangioma. Further evaluation with MRI recommended. 4. Colonic diverticulosis. Discussed with Dr. Kennedy Bucker at 7:13 PM. Clide Cliff, MD 12/27/2015 7:36 PM     EKG Interpretation  EKG interpreted independently by Dr. Larene Pickett    Time 909 [id number] atrial fibrillation w/ RVR , 120 bpm, no STEMI, QTC 460, normal axis  Time 1605 atrial fibrillation 89 bpm, no STEMI, QTC 452, normal axis  Time 1938 NSR 66bpm, T wave flattening in lateral leads, QTC 431    No significant change from previous EKG on record.       Procedures    Clinical Impression & Disposition     Clinical Impression  Final diagnoses:   Atrial fibrillation, currently in sinus rhythm   Abdominal aortic aneurysm (AAA) without rupture        ED Disposition     ED Disposition Condition Date/Time Comment    Admit to [facility]  Thu Dec 27, 2015  8:59 PM Accepting Dr Albina Billet, [facility]           Discharge Medication List as of 12/27/2015 11:00 PM                    Larene Pickett, DO  12/28/15 0011

## 2015-12-28 ENCOUNTER — Inpatient Hospital Stay: Payer: Medicare Other

## 2015-12-28 DIAGNOSIS — I4891 Unspecified atrial fibrillation: Secondary | ICD-10-CM

## 2015-12-28 DIAGNOSIS — I714 Abdominal aortic aneurysm, without rupture: Secondary | ICD-10-CM

## 2015-12-28 DIAGNOSIS — K7689 Other specified diseases of liver: Secondary | ICD-10-CM

## 2015-12-28 DIAGNOSIS — J8489 Other specified interstitial pulmonary diseases: Secondary | ICD-10-CM

## 2015-12-28 DIAGNOSIS — I480 Paroxysmal atrial fibrillation: Secondary | ICD-10-CM | POA: Diagnosis present

## 2015-12-28 LAB — ECG 12-LEAD
Atrial Rate: 133 {beats}/min
Atrial Rate: 250 {beats}/min
Atrial Rate: 66 {beats}/min
Atrial Rate: 54 {beats}/min
P Axis: 78 degrees
P Axis: 41 degrees
P-R Interval: 178 ms
P-R Interval: 174 ms
Q-T Interval: 326 ms
Q-T Interval: 372 ms
Q-T Interval: 412 ms
Q-T Interval: 392 ms
QRS Duration: 88 ms
QRS Duration: 88 ms
QRS Duration: 88 ms
QRS Duration: 86 ms
QTC Calculation (Bezet): 460 ms
QTC Calculation (Bezet): 452 ms
QTC Calculation (Bezet): 431 ms
QTC Calculation (Bezet): 371 ms
R Axis: 51 degrees
R Axis: 21 degrees
R Axis: 27 degrees
R Axis: 31 degrees
T Axis: 30 degrees
T Axis: 26 degrees
T Axis: 39 degrees
T Axis: 56 degrees
Ventricular Rate: 120 {beats}/min
Ventricular Rate: 89 {beats}/min
Ventricular Rate: 66 {beats}/min
Ventricular Rate: 54 {beats}/min

## 2015-12-28 LAB — CBC
Absolute NRBC: 0 10*3/uL
Hematocrit: 30.2 % — ABNORMAL LOW (ref 42.0–52.0)
Hgb: 10.3 g/dL — ABNORMAL LOW (ref 13.0–17.0)
MCH: 34.6 pg — ABNORMAL HIGH (ref 28.0–32.0)
MCHC: 34.1 g/dL (ref 32.0–36.0)
MCV: 101.3 fL — ABNORMAL HIGH (ref 80.0–100.0)
MPV: 9.4 fL (ref 9.4–12.3)
Nucleated RBC: 0 /100 WBC (ref 0.0–1.0)
Platelets: 78 10*3/uL — ABNORMAL LOW (ref 140–400)
RBC: 2.98 10*6/uL — ABNORMAL LOW (ref 4.70–6.00)
RDW: 15 % (ref 12–15)
WBC: 2.58 10*3/uL — ABNORMAL LOW (ref 3.50–10.80)

## 2015-12-28 LAB — BASIC METABOLIC PANEL
BUN: 26 mg/dL (ref 9.0–28.0)
CO2: 23 mEq/L (ref 22–29)
Calcium: 8.9 mg/dL (ref 7.9–10.2)
Chloride: 107 mEq/L (ref 100–111)
Creatinine: 1.2 mg/dL (ref 0.7–1.3)
Glucose: 90 mg/dL (ref 70–100)
Potassium: 3.9 mEq/L (ref 3.5–5.1)
Sodium: 137 mEq/L (ref 136–145)

## 2015-12-28 LAB — LIPID PANEL
Cholesterol / HDL Ratio: 3.3
Cholesterol: 127 mg/dL (ref 0–199)
HDL: 39 mg/dL — ABNORMAL LOW (ref 40–9999)
LDL Calculated: 76 mg/dL (ref 0–99)
Triglycerides: 62 mg/dL (ref 34–149)
VLDL Calculated: 12 mg/dL (ref 10–40)

## 2015-12-28 LAB — HEMOGLOBIN A1C
Average Estimated Glucose: 105.4 mg/dL
Hemoglobin A1C: 5.3 % (ref 4.6–5.9)

## 2015-12-28 LAB — MAGNESIUM: Magnesium: 2.1 mg/dL (ref 1.6–2.6)

## 2015-12-28 LAB — TROPONIN I: Troponin I: 0.03 ng/mL (ref 0.00–0.09)

## 2015-12-28 LAB — HEMOLYSIS INDEX: Hemolysis Index: 10 (ref 0–18)

## 2015-12-28 LAB — TSH: TSH: 4.02 u[IU]/mL (ref 0.35–4.94)

## 2015-12-28 LAB — GFR: EGFR: 59.4

## 2015-12-28 MED ORDER — ONDANSETRON 4 MG PO TBDP
4.0000 mg | ORAL_TABLET | Freq: Four times a day (QID) | ORAL | Status: DC | PRN
Start: 2015-12-28 — End: 2015-12-29

## 2015-12-28 MED ORDER — PANTOPRAZOLE SODIUM 40 MG PO TBEC
40.0000 mg | DELAYED_RELEASE_TABLET | Freq: Every morning | ORAL | Status: DC
Start: 2015-12-28 — End: 2015-12-29
  Administered 2015-12-28 – 2015-12-29 (×2): 40 mg via ORAL
  Filled 2015-12-28 (×2): qty 1

## 2015-12-28 MED ORDER — ONDANSETRON HCL 4 MG/2ML IJ SOLN
4.0000 mg | Freq: Four times a day (QID) | INTRAMUSCULAR | Status: DC | PRN
Start: 2015-12-28 — End: 2015-12-29

## 2015-12-28 MED ORDER — ENOXAPARIN SODIUM 100 MG/ML SC SOLN
1.0000 mg/kg | Freq: Two times a day (BID) | SUBCUTANEOUS | Status: DC
Start: 2015-12-28 — End: 2015-12-29
  Administered 2015-12-28 – 2015-12-29 (×3): 100 mg via SUBCUTANEOUS
  Filled 2015-12-28 (×3): qty 1

## 2015-12-28 MED ORDER — POTASSIUM CHLORIDE CRYS ER 20 MEQ PO TBCR
40.0000 meq | EXTENDED_RELEASE_TABLET | Freq: Once | ORAL | Status: AC
Start: 2015-12-28 — End: 2015-12-28
  Administered 2015-12-28: 40 meq via ORAL
  Filled 2015-12-28: qty 2

## 2015-12-28 MED ORDER — TAMSULOSIN HCL 0.4 MG PO CAPS
0.4000 mg | ORAL_CAPSULE | Freq: Every day | ORAL | Status: DC
Start: 2015-12-28 — End: 2015-12-29
  Administered 2015-12-28 (×2): 0.4 mg via ORAL
  Filled 2015-12-28 (×3): qty 1

## 2015-12-28 MED ORDER — TOLTERODINE TARTRATE ER 2 MG PO CP24
2.0000 mg | ORAL_CAPSULE | Freq: Every day | ORAL | Status: DC
Start: 2015-12-28 — End: 2015-12-29
  Administered 2015-12-28 – 2015-12-29 (×2): 2 mg via ORAL
  Filled 2015-12-28 (×2): qty 1

## 2015-12-28 MED ORDER — NALOXONE HCL 0.4 MG/ML IJ SOLN (WRAP)
0.2000 mg | INTRAMUSCULAR | Status: DC | PRN
Start: 2015-12-28 — End: 2015-12-29

## 2015-12-28 MED ORDER — GADOBUTROL 1 MMOL/ML IV SOLN
0.1000 mmol/kg | Freq: Once | INTRAVENOUS | Status: AC | PRN
Start: 2015-12-28 — End: 2015-12-28
  Administered 2015-12-28: 10 mmol via INTRAVENOUS

## 2015-12-28 MED ORDER — ACETAMINOPHEN 325 MG PO TABS
650.0000 mg | ORAL_TABLET | ORAL | Status: DC | PRN
Start: 2015-12-28 — End: 2015-12-29

## 2015-12-28 MED ORDER — LISINOPRIL 20 MG PO TABS
20.0000 mg | ORAL_TABLET | Freq: Every morning | ORAL | Status: DC
Start: 2015-12-28 — End: 2015-12-29
  Administered 2015-12-28 – 2015-12-29 (×2): 20 mg via ORAL
  Filled 2015-12-28 (×2): qty 1

## 2015-12-28 MED ORDER — ATORVASTATIN CALCIUM 10 MG PO TABS
20.0000 mg | ORAL_TABLET | Freq: Every morning | ORAL | Status: DC
Start: 2015-12-28 — End: 2015-12-29
  Administered 2015-12-28 – 2015-12-29 (×2): 20 mg via ORAL
  Filled 2015-12-28 (×2): qty 2

## 2015-12-28 NOTE — Progress Notes (Signed)
12/28/15 1318   Patient Type   Within 30 Days of Previous Admission? No   Healthcare Decisions   Interviewed: Patient;Spouse   Orientation/Decision Estate manager/land agent of Patient Alert and Oriented x3, able to make decisions   Advance Directive Patient has advance directive, copy not in chart   Advance Directive not in Chart Copy requested from family/decision maker   Healthcare Agent Appointed Yes   Healthcare Agent's Name Belenda Cruise   Healthcare Agent's Phone Number [phone]   Additional Emergency Contacts? none    Prior to admission   Prior level of function Independent with ADLs   Type of Residence Private residence   Have running water, electricity, heat, etc? Yes   Living Arrangements Spouse/significant other   How do you get to your MD appointments? self   How do you get your groceries? self   Who fixes your meals? self   Who does your laundry? self   Who picks up your prescriptions? self    Dressing Independent   Grooming Independent   Feeding Independent   Bathing Independent   Toileting Independent   DME Currently at Home (none )   Name of Prior Assisted Living Facility none   Home Care/Community Services None   Prior SNF admission? (Detail) none   Prior Rehab admission? (Detail) none    Adult Protective Services (APS) involved? No   Discharge Planning   Support Systems Spouse/significant other;Family members   Patient expects to be discharged to: home    Anticipated [place] plan discussed with: Same as interviewed   Follow up appointment scheduled? (closer to d/c )   Mode of transportation: Private car (family member)   Consults/Providers   PT Evaluation Needed 2   OT Evalulation Needed 2   SLP Evaluation Needed 2   Correct PCP listed in Epic? Yes   Important Message from [facility] Notice   Patient received 1st IMM Letter? Yes   Date of most recent IMM given: 12/28/15        12/28/15 1318   Patient Type   Within 30 Days of Previous Admission? No   Healthcare Decisions   Interviewed: Patient;Spouse    Orientation/Decision Estate manager/land agent of Patient Alert and Oriented x3, able to make decisions   Advance Directive Patient has advance directive, copy not in chart   Advance Directive not in Chart Copy requested from family/decision maker   Healthcare Agent Appointed Yes   Healthcare Agent's Name Belenda Cruise   Healthcare Agent's Phone Number [phone]   Additional Emergency Contacts? none    Prior to admission   Prior level of function Independent with ADLs   Type of Residence Private residence   Have running water, electricity, heat, etc? Yes   Living Arrangements Spouse/significant other   How do you get to your MD appointments? self   How do you get your groceries? self   Who fixes your meals? self   Who does your laundry? self   Who picks up your prescriptions? self    Dressing Independent   Grooming Independent   Feeding Independent   Bathing Independent   Toileting Independent   DME Currently at Home (none )   Name of Prior Assisted Living Facility none   Home Care/Community Services None   Prior SNF admission? (Detail) none   Prior Rehab admission? (Detail) none    Adult Protective Services (APS) involved? No   Discharge Planning   Support Systems Spouse/significant other;Family members   Patient expects to be discharged to: home  Anticipated [place] plan discussed with: Same as interviewed   Follow up appointment scheduled? (closer to d/c )   Mode of transportation: Private car (family member)   Consults/Providers   PT Evaluation Needed 2   OT Evalulation Needed 2   SLP Evaluation Needed 2   Correct PCP listed in Epic? Yes   Important Message from [facility] Notice   Patient received 1st IMM Letter? Yes   Date of most recent IMM given: 12/28/15

## 2015-12-28 NOTE — Plan of Care (Signed)
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    O/E AAOX 3  Vitals stable  s1 s2 regular  Lungs clear  abd soft NT  Ext no edema      Afib-new onset- cardiology consult today. Ordered ECHO.  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    Signed by Phylliss Blakes, MD

## 2015-12-28 NOTE — Plan of Care (Signed)
Problem: Pain  Goal: Pain at adequate level as identified by patient  Outcome: Progressing  Patient has not reported any chest pain or discomfort. Will continue to reassess.     Comments: Patient was transferred last night from [facility]. Currently normal sinus on tele with a HR in the 50's. Patient was brady in the 40's when sleeping. No reports of chest pain or discomfort. Wife is at the bedside. Started on lovenox. Plan is to get carotid artery duplexes to evaluate for carotid stenosis and duplex US of bilateral extremities. EKG ordered for tomorrow morning. Plan is to repair AAA. I&O's. Patient instructed. Urnal at the bedside. Cardia diet. RA. A&Ox4.

## 2015-12-28 NOTE — Progress Notes (Cosign Needed)
Dual Nurse Admission Skin Note:    No skin issues. Skin is intact. No signs of redness or breakdown.     Done with Benay Pike, RN

## 2015-12-28 NOTE — Consults (Signed)
SURGICAL CONSULTATION  Team 2: Vascular Surgery, [facility] [phone]    Date Time: 12/28/15 12:11 AM  Patient Name: Brent [name] F  Attending Physician: Marikay Alar, MD  Consulting Physician:  Marcell Anger, MD  Reason for consultation: aortic aneurysm    History of Present Illness:   Brent [name] is a 75 y.o. male who presented to an OSH ED earlier today after experiencing substernal chest and back tightness this afternoon after playing golf.  This radiated to his jaw.  Workup demonstrated that the patient was in Afib, which is apparently a new diagnosis.  CTA obtained to evaluate for aortic dissection revealed a saccular infrarenal aortic aneurysm, which the patient reports was noted on outside imaging around 2013.  He was told at that time that it would be monitored.  The patient denies abdominal pain, f/c, n/v, diarrhea, constipation, and other complaints.    Past Medical History:     Past Medical History:   Diagnosis Date   . Anemia     PANCYTOPENIA-SEE DR [facility] NOTE IN [facility] 06-25-2015   . Arthritis     RIGHT KNEE-CORTISONE SHOT 04/03/15, LT TOTAL KNEE 2011, ACL RT KNEE 1988   . 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 Chronic gout     LAST FLARE UP 206, NO MEDS X 1 YR FOR IT   . Clotting disorder     04/2011, PE TOOK COUMADIN X 3 MTS   . Disorder of prostate     BPH -NO MEDS   . Fractures     RT TIBI/FIB-CLOSED REDUCTION 1973,    . GERD (gastroesophageal reflux disease)     MED EFFECTIVE -TAKES PROTONIX   . 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 Hiatal hernia    . Hyperlipidemia    . Hypertensive disorder     CONTROLLED    . Interstitial lung disease DX 2013   . 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 On home O2 09/2010    @night  2 liters, 88-96% AFTER PNUEMONIA. FOR A FEW MTS NONE SINCE   . Pneumonia 2013   . Pulmonary embolus 2013    TREATED IN [facility] X 3 MONTHS   .  Reflux esophagitis        Past Surgical History:     Past Surgical History:   Procedure Laterality Date   . BONE MARROW BIOPSY  03/2015    FOLLOWED BY DR [facility] HEMATOLOGIST   . EGD      X 2   . EGD N/A 08/23/2015    Procedure: EGD;  Surgeon: Trixie Dredge, MD;  Location: [place] ENDOSCOPY OR;  Service: Gastroenterology;  Laterality: N/A;   . FRACTURE SURGERY  1988    RT TIB/FIB CLOSED REDUCTION   . HERNIA REPAIR Right 2014    INGUINAL    . JOINT REPLACEMENT      Left TKR, 03/2009   . RT KNEE ACL  1988   . THORACOSCOPY, (VATS) Right 05/2011    RUL, RLL   . TONSILLECTOMY      CHILDHOOD        Family History:     Family History   Problem Relation Age of Onset   . Aplastic anemia Mother    . Hypertension Father    . Ovarian cancer Neg  Hx    . Lung cancer Neg Hx    . Rectal cancer Neg Hx    . Colon cancer Neg Hx    . Breast cancer Neg Hx    . Cancer Neg Hx        Social History:     Social History     Social History   . Marital status: Married     Spouse name: N/A   . Number of children: N/A   . Years of education: N/A     Social History Main Topics   . Smoking status: Former Smoker     Packs/day: 1.50     Years: 20.00     Quit date: 04/17/1982   . Smokeless tobacco: Never Used      Comment: CIGAR 5-6 TIMES PER YEAR   . Alcohol use 1.2 - 3.6 oz/week     2 - 6 Shots of liquor per week   . Drug use: No   . Sexual activity: No     Other Topics Concern   . Exposure To Radiation? Yes     Radiation Treatment in 2003   . Exposure To Other Chemicals? No   . Exposure To Asbestos? No     Social History Narrative   . No narrative on file       Allergies:   No Known Allergies    Medications:     Prior to Admission medications    Medication Sig Start Date End Date Taking? Authorizing Provider   atorvastatin (LIPITOR) 40 MG tablet Take 20 mg by mouth every morning.      Yes [provider]   lisinopril (PRINIVIL,ZESTRIL) 10 MG tablet Take 20 mg by mouth every morning.      Yes [provider]   Multiple Vitamin  (MULTIVITAMIN) tablet Take 1 tablet by mouth daily.   Yes [provider]   pantoprazole (PROTONIX) 40 MG tablet Take 40 mg by mouth every morning.      Yes [provider]   vitamin B-12 (CYANOCOBALAMIN) 500 MCG tablet Take 500 mcg by mouth daily. UNSURE OF THE DOSE     Yes [provider]   Cholecalciferol (VITAMIN D) 1000 UNIT tablet Take 1,000 Units by mouth daily. UNSURE OF THE DOSE    [provider]       Review of Systems:   General ROS: negative  ENT ROS: negative  Cardiovascular ROS: positive for - chest pain  Respiratory ROS: negative  Gastrointestinal ROS: negative  Genito-Urinary ROS: negative  Hematological and Lymphatic ROS: negative  Endocrine ROS: negative  Musculoskeletal ROS: negative  Neurological ROS: negative     Physical Exam:     Vitals:    12/27/15 2337   BP: 95/61   Pulse: (!) 54   Resp: 16   SpO2: 95%       Intake and Output Summary (Last 24 hours) at Date Time  No intake or output data in the 24 hours ending 12/28/15 0011    Gen: NAD, A&OX3  CV: irregularly irregular  Pulm: CTAB  Abd: soft, NT/ND, no palpable hernias, well-healed right inguinal surgical scar noted.  No palpable or pulsatile masses  Ext: palpable and equal radial, femoral, popliteal, and DP/PT pulses bilaterally    Labs:     Results     ** No results found for the last 24 hours. **          Rads:     Radiology Results (24  Hour)     ** No results found for the last 24 hours. **          Problem List:     Patient Active Problem List   Diagnosis   . Dyspnea on exertion   . HTN (hypertension)   . Hyperlipidemia   . On home oxygen therapy   . History of pulmonary embolism   . GERD (gastroesophageal reflux disease)   . Anemia due to bone marrow failure   . Thrombocytopenia   . Neutropenia       Assessment:   75 y.o. male with saccular AAA noted on imaging obtained for aortic dissection.  Patient's aneurysm appears to warrant repair in the future.  Pending primary team's workup and plan for the  patient's new Afib and chest pain, we can begin his preoperative workup ahead of AAA repair.    Plan:   -admit to IM  -please obtain bilateral carotid artery duplexes to evaluate for stenosis  -please obtain bilateral lower extremity arterial duplexes to evaluate for femoral and popliteal aneurysms  -please obtain non-invasive BLE studies, to include ABIs  -recommend cardiology evaluation for pre-operative risk stratification and optimization    Signed by: Clearnce Hasten, Lavi Sheehan Lutricia Horsfall, MD, have personally seen and examined the patient.  I agree with the resident's findings and management plan.  Plan EVAR for saccular aneurysm of abdominal aorta on outpatient basis.  The indications, benefits and risks have been explained to the patient in the prsence of the nurse/resident/family.  Risks including but not limited to bleeding, infection, groin complications, embolization, vessel perforation, ischemia, retained foreign bodies, anaphylaxis, renal failure, need for adjunctive procedures, nerve damage, damage to adjacent structures, need for open surgical conversion, endoleak, lack of resolution of signs and symptoms, inability to complete the procedure, limb loss, stroke, renal failure, heart attack, multisystem organ failure, limb paralysis, colonic or gut infarction, and death were explained to the patient.  The patient demonstrated understanding, and acceptance of the risks, and agreed to proceed forth with the procedure.  I answered all of the patient's questions to the patient's satisfaction.    Orian Figueira Lutricia Horsfall, MD, FACS, RPVI  Vascular and Endovascular Surgery   [facility]   Director - [facility]  Director - [facility]ascular Quality Initiative   Pager:  [phone]  Tel:  [phone]

## 2015-12-28 NOTE — UM Notes (Signed)
Guideline:Atrial Fibrillation( M-505)  GLOS:A-1 (DS)   Primary Diagnosis Code:I48.91    12/28/15 0130  Admit to Inpatient        75 y.o. M h/o NSIP, possible PE history, pancytopenia (unclear cause), HTN, HLD, GERD, and gout. Presented to ED w/ chest pressure and was found to have new onset AFib and a saccular infrarenal aortic aneurysm.  He reports that he was playing golf this morning and was at lunch when he started having chest tightness and mild discomfort radiating to back. Reports having a few episodes of panic attacks in distant past with a similar sensation.  In [place] ED found to be in AFib with RVR and HR 120s.  Given Diltiazem 10 mg IV with improved HR and subsequently converted back to sinus rhythm.      CTA chest/abdomen= saccular infrarenal AAA vs penetrating ulcer and bilateral common iliac artery ectasias.      Transferred to [facility] for further management.  Pt reports that he has had infrequent flares of  NSIP with acute fevers and SOB.  Had a fever to T 100.47F on Sunday, and was started on a short Prednisone taper starting at 40 mg on Monday and decreasing by 10 mg each day.     97 .2, 88, 94%, 18, 106/97    EKG= sinus brady    Abn labs: WBC 2.54, H&H 11.2/33.2, plt 84, BUN 29.8, Cr 1.4, D-dimer 0.87    Trop neg x 1  BNP WNL    CXR= neg.    CTA chest= 1. Focal outpouching distal abdominal aorta to the left just above bifurcation likely representing a saccular aneurysm or penetrating aortic ulcer. Just superiorly there is focal ectasia of distal abdominal aorta. Rest of aorta is of normal caliber. Ectasia bilateral common iliac arteries.  2. Mild opacity anterior right middle lobe nonspecific and may be infectious inflammatory or scarring.  3. Low density lesion right hepatic lobe not characterized. Suggestion of some peripheral enhancement and may represent hemangioma. Further evaluation with MRI recommended.  4. Colonic diverticulosis.      Care Day#1, 12/27/15  LOC: Inpatient Cardiac Tele  Unit    Plan:     # New Diagnosis Paroxysmal Atrial Fibrillation: He was found to be in AFib with RVR and HR 120 on arrival in the ED.  He subsequently converted back to sinus rhythm with HR 60s.  He has had negative Troponin x2 and low BNP 84.8 on admission.  His TSH 4.02 is normal.  He denies any known history of AFib.  His AFib may have been triggered by his recent NSIP flare.  He had HR 50s-60s when seen for admission.  -- Call Cardiology consult in AM  -- Monitor on telemetry  -- Obtain TTE to assess for structural issues  -- Hold rate control meds for now with bradycardia  -- Start Enoxaparin 100 mg SC Q12H    # Saccular Infrarenal Aortic Aneurysm: He reported chest pain radiating to the back on admission.  CTA chest/abdomen showed a saccular infrarenal AAA and ectasia of the bilateral common iliac arteries.  He was evaluated by Vascular Surgery on arrival here and recommended for pre-operative evaluation.  -- Appreciate Vascular Surgery recs  -- Obtain carotid dopplers per Vascular Surgery  -- Obtain LE arterial dopplers per Vascular Surgery    # Liver Lesion: His CTA chest/abdomen showed a nonspecific low density lesion in the right hepatic lobe, possibly a hemangioma, and further imaging with MRI was recommended.  He had unremarkable  LFTs on admission.  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  He is followed by Dr. Tedra Coupe in Pulmonology.  He reports a recent mild flare treated with a short Prednisone taper this week.  He was noted to have nonspecific mild opacity in the anterior RML on his CTA.  He reports baseline SpO2 in the low-mid 90s on RA, which is currently stable.  He denies any current respiratory symptoms.  -- Consider Pulmonology consult  -- Monitor respiratory status  -- Not currently on medication    # HTN / HLD: He has had BP 90s-130s/60s-70s today.  -- Continue home Atorvastatin 20 mg PO QAM  --  Continue home Lisinopril 20 mg PO QAM    # Pancytopenia: He has a history of stable pancytopenia and recently had a bone marrow biopsy without a clear cause identified.  On admission, he had WBC 2.54, Hgb 11.2, and Plt 84 near baseline.  He had decreased Hgb 10.3 this AM without overt bleeding.  -- Trend CBC daily  -- Monitor for bleeding    # GERD: Stable without acute symptoms.  -- Continue home Pantoprazole 40 mg PO QAM    # BPH: Stable.  -- Continue home Tamsulosin 0.4 mg PO daily  -- Continue home Tolterodine 2 mg PO daily    Scheduled Meds:  Current Facility-Administered Medications   Medication Dose Route Frequency   . atorvastatin  20 mg Oral QAM   . enoxaparin  1 mg/kg Subcutaneous Q12H   . lisinopril  20 mg Oral QAM   . pantoprazole  40 mg Oral QAM AC   . tamsulosin  0.4 mg Oral QD after dinner   . tolterodine  2 mg Oral Daily     Vascular Plan:   -admit to IM  -please obtain bilateral carotid artery duplexes to evaluate for stenosis  -please obtain bilateral lower extremity arterial duplexes to evaluate for femoral and popliteal aneurysms  -please obtain non-invasive BLE studies, to include ABIs  -recommend cardiology evaluation for pre-operative risk stratification and optimization      Lucia Bitter RN, BSN  Utilization Review  [facility]  Cell# [phone]   Spectra-link# [phone]   Fax# [phone].

## 2015-12-28 NOTE — Consults (Signed)
CONSULTATION    Date Time: 12/28/15 5:31 PM  Patient Name: Brent [name],Brent [name]  Requesting Physician: Phylliss Blakes, MD      Reason for Consultation:   New onset atrial fib    Assessment:    75 y.o. 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 Found to have atrial fib with rate of 120 bpm. Given diltiazem 10 mg IV x 1. Spontaneously converted to sinus rhythm (bradycardia).     Chest tightness could have been due to atrial fib with fast heart rate. He appears comfortable and without recurrence. Troponin and BNP are negative, which are reassuring.     Plan:     - Follow up echocardiogram.   - CHADS2Vasc score is 2 (HTN, age).   - If non-valvular, and okay from vascular standpoint without immediate plan for intervention/procedures, favor anticoagulation with Eliquis 5 mg BID.   - Monitor platelets closely, given pancytopenia with moderate thrombocytopenia.  - Possible stress test as outpatient.   - If remains stable, and no plans for intervention during this hospital course, okay for patient to to be discharged, and follow up as outpatient with Dr. Willaim Bane, [facility].       History:   ELIZJAH [name] is a 75 y.o. 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 His chest tightness occurred after playing golf, which sitting down for lunch. Not associated with dyspnea or diaphoresis. Patient was found to have atrial fib with rate in 120 BPM. He then spontaneously converted to sinus rhythm. CT angiogram of the chest and abdomen was negative for dissection, but found saccular infrarenal aortic  aneurysm, which patient reports was diagnosed in 2013. He was transferred to [facility] for further care. Thus far, his Troponin has been negative x 2 and BNP is not elevated. On tele, he remained in sinus rhythm (brady). Echocardiogram is currently pending.     Patient was previously evaluated by Dr. Willaim Bane in our office earlier this year for abnormal ECG with minor T wave changes. Nuclear stress test in March of 2017 was negative for infarction or ischemia.       Past Medical History:     Past Medical History:   Diagnosis Date   . Anemia     PANCYTOPENIA-SEE DR [facility] NOTE IN [facility] 06-25-2015   . Arthritis     RIGHT KNEE-CORTISONE SHOT 04/03/15, LT TOTAL KNEE 2011, ACL RT KNEE 1988   . 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 Chronic gout     LAST FLARE UP 206, NO MEDS X 1 YR FOR IT   . Clotting disorder     04/2011, PE TOOK COUMADIN X 3 MTS   . Disorder of prostate     BPH -NO MEDS   . Fractures     RT TIBI/FIB-CLOSED REDUCTION 1973,    . GERD (gastroesophageal reflux disease)     MED EFFECTIVE -TAKES PROTONIX   . 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 Hiatal hernia    . Hyperlipidemia    . Hypertensive disorder     CONTROLLED    .  Interstitial lung disease DX 2013   . 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 On home O2 09/2010    @night  2 liters, 88-96% AFTER PNUEMONIA. FOR A FEW MTS NONE SINCE   . Pneumonia 2013   . Pulmonary embolus 2013    TREATED IN [facility] X 3 MONTHS   . Reflux esophagitis        Past Surgical History:     Past Surgical History:   Procedure Laterality Date   . BONE MARROW BIOPSY  03/2015    FOLLOWED BY DR [facility] HEMATOLOGIST   . EGD      X 2   . EGD N/A 08/23/2015    Procedure: EGD;  Surgeon: Trixie Dredge, MD;  Location: [place] [facility] OR;  Service: Gastroenterology;  Laterality: N/A;   . FRACTURE SURGERY  1988    RT TIB/FIB CLOSED REDUCTION   .  HERNIA REPAIR Right 2014    INGUINAL    . JOINT REPLACEMENT      Left TKR, 03/2009   . RT KNEE ACL  1988   . THORACOSCOPY, (VATS) Right 05/2011    RUL, RLL   . TONSILLECTOMY      CHILDHOOD        Family History:     Family History   Problem Relation Age of Onset   . Aplastic anemia Mother    . Hypertension Father    . Ovarian cancer Neg Hx    . Lung cancer Neg Hx    . Rectal cancer Neg Hx    . Colon cancer Neg Hx    . Breast cancer Neg Hx    . Cancer Neg Hx        Social History:     Social History     Social History   . Marital status: Married     Spouse name: N/A   . Number of children: N/A   . Years of education: N/A     Social History Main Topics   . Smoking status: Former Smoker     Packs/day: 1.50     Years: 20.00     Quit date: 04/17/1982   . Smokeless tobacco: Never Used      Comment: CIGAR 5-6 TIMES PER YEAR   . Alcohol use 1.2 - 3.6 oz/week     2 - 6 Shots of liquor per week   . Drug use: No   . Sexual activity: No     Other Topics Concern   . Exposure To Radiation? Yes     Radiation Treatment in 2003   . Exposure To Other Chemicals? No   . Exposure To Asbestos? No     Social History Narrative   . No narrative on file       Allergies:   No Known Allergies    Medications:     Current Facility-Administered Medications   Medication Dose Route Frequency   . atorvastatin  20 mg Oral QAM   . enoxaparin  1 mg/kg Subcutaneous Q12H   . lisinopril  20 mg Oral QAM   . pantoprazole  40 mg Oral QAM AC   . tamsulosin  0.4 mg Oral QD after dinner   . tolterodine  2 mg Oral Daily       Review of Systems:   A comprehensive review of systems was: General ROS: negative  Respiratory ROS: no cough, shortness of breath,  or wheezing  Cardiovascular ROS: positive for - chest pain  Gastrointestinal ROS: no abdominal pain, change in bowel habits, or black or bloody stools  Musculoskeletal ROS: negative  Neurological ROS: no TIA or stroke symptoms  Dermatological ROS: negative    Physical Exam:     Vitals:    12/28/15 1100   BP: 106/58    Pulse: (!) 56   Resp: 18   Temp: 97.6 [name] (36.4 C)   SpO2: 93%       Intake and Output Summary (Last 24 hours) at Date Time    Intake/Output Summary (Last 24 hours) at 12/28/15 1731  Last data filed at 12/28/15 0900   Gross per 24 hour   Intake              848 ml   Output              375 ml   Net              473 ml       General appearance - alert, well appearing, and in no distress  Eyes - pupils equal and reactive, extraocular eye movements intact  Neck - supple, no significant adenopathy  Chest - clear to auscultation, no wheezes, rales or rhonchi, symmetric air entry  Heart - normal rate, regular rhythm, normal S1, S2, no murmurs, rubs, clicks or gallops, Bradycardic, regular, no murmurs  Abdomen - soft, nontender, nondistended, no masses or organomegaly  Neurological - alert, oriented, normal speech, no focal findings or movement disorder noted  Extremities - peripheral pulses normal, no pedal edema, no clubbing or cyanosis  Skin - normal coloration and turgor, no rashes, no suspicious skin lesions noted    Labs Reviewed:     Results     Procedure Component Value Units Date/Time    Hemoglobin A1C [[id number]] Collected:  12/28/15 0153    Specimen:  Blood Updated:  12/28/15 0940     Hemoglobin A1C 5.3 %      Average Estimated Glucose 105.4 mg/dL     Lipid panel [[id number]]  (Abnormal) Collected:  12/28/15 0153    Specimen:  Blood Updated:  12/28/15 0326     Cholesterol 127 mg/dL      Triglycerides 62 mg/dL      HDL 39 (L) mg/dL      LDL Calculated 76 mg/dL      VLDL Cholesterol Cal 12 mg/dL      CHOL/HDL Ratio 3.3    Hemolysis index [[id number]] Collected:  12/28/15 0153     Updated:  12/28/15 0326     Hemolysis Index 10    TSH [[id number]] Collected:  12/28/15 0153    Specimen:  Blood Updated:  12/28/15 0255     Thyroid Stimulating Hormone 4.02 uIU/mL     CBC without differential [[id number]]  (Abnormal) Collected:  12/28/15 0153    Specimen:  Blood from Blood Updated:  12/28/15 0251     WBC 2.58 (L) x10 3/uL       Hgb 10.3 (L) g/dL      Hematocrit 24.4 (L) %      Platelets 78 (L) x10 3/uL      RBC 2.98 (L) x10 6/uL      MCV 101.3 (H) fL      MCH 34.6 (H) pg      MCHC 34.1 g/dL      RDW 15 %      MPV 9.4 fL      Nucleated  RBC 0.0 /100 WBC      Absolute NRBC 0.00 x10 3/uL     Troponin I [[id number]] Collected:  12/28/15 0153    Specimen:  Blood Updated:  12/28/15 0241     Troponin I 0.03 ng/mL     Basic Metabolic Panel [[id number]] Collected:  12/28/15 0153    Specimen:  Blood Updated:  12/28/15 0237     Glucose 90 mg/dL      BUN 78.2 mg/dL      Creatinine 1.2 mg/dL      Calcium 8.9 mg/dL      Sodium 956 mEq/L      Potassium 3.9 mEq/L      Chloride 107 mEq/L      CO2 23 mEq/L     Magnesium [[id number]] Collected:  12/28/15 0153    Specimen:  Blood Updated:  12/28/15 0237     Magnesium 2.1 mg/dL     GFR [[id number]] Collected:  12/28/15 0153     Updated:  12/28/15 0237     EGFR 59.4          Rads:   Radiological Procedure reviewed.     Signed by: Meryl Dare

## 2015-12-28 NOTE — Progress Notes (Signed)
Admission Medication Reconciliation  No chief complaint on file.      Allergies:  Review of patient's allergies indicates no known allergies.    Patient's Pharmacy Information:  [facility]   [address] [place], [place], [place] [address]  [phone]    Primary Source:  Patient and pharmacy    Prior to Admission Medication List:  Medication Sig   . atorvastatin (LIPITOR) 40 MG tablet Take 20 mg by mouth every morning.      . Coenzyme Q10 (CO Q 10 PO) Take 1 capsule by mouth daily.   Marland Kitchen lisinopril (PRINIVIL,ZESTRIL) 10 MG tablet Take 20 mg by mouth every morning.      . Multiple Vitamin (MULTIVITAMIN) tablet Take 1 tablet by mouth daily.   . pantoprazole (PROTONIX) 40 MG tablet Take 40 mg by mouth every morning.      Marland Kitchen PREDNISONE PO Take by mouth See Admin Instructions.Taper Regimen but unsure dose   . solifenacin (VESICARE) 5 MG tablet Take 5 mg by mouth daily.   . tamsulosin (FLOMAX) 0.4 MG Cap Take 0.4 mg by mouth daily.   Marland Kitchen tolterodine (DETROL LA) 2 MG 24 hr capsule Take 2 mg by mouth daily.       Comments/Recommendations:    -The following medications have been removed:   Vitamin B12    -The following medications have been added:   Solifenacin 5mg  PO Daily- Patient states he has a 30 day supply sample of this medication but was prescribed Tolterodine 2mg  instead.  He will start Tolterodine after he is finish with Solifenacin.    Please note, patient has not started tolterodine yet but will when solifenacin supply is exhausted     Prednisone Taper Regimen- Patient does not know dose of taper regimen but states he finished it on Thursday.  Physician office and pharmacy does not have records of prednisone filled.       Weyman Croon, PharmD, BCPS  Transitions of Care Pharmacist  [phone]

## 2015-12-28 NOTE — H&P (Signed)
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    Plan:     # New Diagnosis Paroxysmal Atrial Fibrillation: He was found to be in AFib with RVR and HR 120 on arrival in the ED.  He subsequently converted back to sinus rhythm with HR 60s.  He has had negative Troponin x2 and low BNP 84.8 on admission.  His TSH 4.02 is normal.  He denies any known history of AFib.  His AFib may have been triggered by his recent NSIP flare.  He had HR 50s-60s when seen for admission.  -- Call Cardiology consult in AM  -- Monitor on telemetry  -- Obtain TTE to assess for structural issues  -- Hold rate control meds for now with bradycardia  -- Start Enoxaparin 100 mg SC Q12H    # Saccular Infrarenal Aortic Aneurysm: He reported chest pain radiating to the back on admission.  CTA chest/abdomen showed a saccular infrarenal AAA and ectasia of the bilateral common iliac arteries.  He was evaluated by Vascular Surgery on arrival here and recommended for pre-operative evaluation.  -- Appreciate Vascular Surgery recs  -- Obtain carotid dopplers per Vascular Surgery  -- Obtain LE arterial dopplers per Vascular Surgery    # Liver Lesion: His CTA chest/abdomen showed a nonspecific low density lesion in the right hepatic lobe, possibly a hemangioma, and further imaging with MRI was recommended.  He had unremarkable LFTs on admission.  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  He is followed  by Dr. Tedra Coupe in Pulmonology.  He reports a recent mild flare treated with a short Prednisone taper this week.  He was noted to have nonspecific mild opacity in the anterior RML on his CTA.  He reports baseline SpO2 in the low-mid 90s on RA, which is currently stable.  He denies any current respiratory symptoms.  -- Consider Pulmonology consult  -- Monitor respiratory status  -- Not currently on medication    # HTN / HLD: He has had BP 90s-130s/60s-70s today.  -- Continue home Atorvastatin 20 mg PO QAM  -- Continue home Lisinopril 20 mg PO QAM    # Pancytopenia: He has a history of stable pancytopenia and recently had a bone marrow biopsy without a clear cause identified.  On admission, he had WBC 2.54, Hgb 11.2, and Plt 84 near baseline.  He had decreased Hgb 10.3 this AM without overt bleeding.  -- Trend CBC daily  -- Monitor for bleeding    # GERD: Stable without acute symptoms.  -- Continue home Pantoprazole 40 mg PO QAM    # BPH: Stable.  -- Continue home Tamsulosin 0.4 mg PO daily  -- Continue home Tolterodine 2 mg PO daily      Disposition: (Please see PAF column for Expected D/C Date)   Today's date: 12/28/2015  Admit  Date: 12/27/2015 11:30 PM  Service status: Inpatient: risk of morbidity and mortality and risk of progressive disease  Clinical Milestones: new AFib, AAA evaluation  Anticipated discharge needs: TBD    History of Presenting Illness:   Brent [name] is a 75 y.o. 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  He reports that he was playing golf this morning and was at lunch when he started having chest tightness and mild discomfort radiating to the back.  He denies any SOB, nausea, diaphoresis, dizziness, lightheadedness, or palpitations.  He reports having a few episodes of panic attacks in the distant past with a similar sensation.   He presented to the ED and was found to be in AFib with RVR and HR 120s.  He was given Diltiazem 10 mg IV with improved HR and subsequently converted back to sinus rhythm.  CTA chest/abdomen showed a saccular infrarenal AAA vs penetrating ulcer and bilateral common iliac artery ectasias.  He was transferred here for further management.  When seen for admission, he denied any current symptoms.  He reports that he has had infrequent flares of his NSIP with acute fevers and SOB.  He had a fever to T 100.6F on Sunday, and was started on a short Prednisone taper starting at 40 mg on Monday and decreasing by 10 mg each day.  He has otherwise been active and in his usual state of health recently.    Past Medical History:     Past Medical History:   Diagnosis Date   . Anemia     PANCYTOPENIA-SEE DR [facility] NOTE IN [facility] 06-25-2015   . Arthritis     RIGHT KNEE-CORTISONE SHOT 04/03/15, LT TOTAL KNEE 2011, ACL RT KNEE 1988   . 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 Chronic gout     LAST FLARE UP 206, NO MEDS X 1 YR FOR IT   . Clotting disorder     04/2011, PE TOOK COUMADIN X 3 MTS   . Disorder of prostate     BPH -NO MEDS   . Fractures     RT TIBI/FIB-CLOSED REDUCTION 1973,    . GERD (gastroesophageal reflux disease)     MED EFFECTIVE -TAKES PROTONIX   . 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 Hiatal hernia    . Hyperlipidemia    . Hypertensive disorder     CONTROLLED    . Interstitial lung disease DX 2013   . 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 On home O2 09/2010    @night  2 liters, 88-96% AFTER PNUEMONIA. FOR A FEW MTS NONE SINCE   . Pneumonia 2013   . Pulmonary embolus 2013    TREATED IN [facility] X 3 MONTHS   . Reflux esophagitis        Available old records reviewed, including:  Records from 2013 with lung biopsy.  Hematology notes from   03/2015 regarding unexplained pancytopenia without  evidence of malignancy.    Past Surgical History:     Past Surgical History:   Procedure Laterality Date   . BONE MARROW BIOPSY  03/2015    FOLLOWED BY DR [facility] HEMATOLOGIST   .  EGD      X 2   . EGD N/A 08/23/2015    Procedure: EGD;  Surgeon: Trixie Dredge, MD;  Location: [place] [facility] OR;  Service: Gastroenterology;  Laterality: N/A;   . FRACTURE SURGERY  1988    RT TIB/FIB CLOSED REDUCTION   . HERNIA REPAIR Right 2014    INGUINAL    . JOINT REPLACEMENT      Left TKR, 03/2009   . RT KNEE ACL  1988   . THORACOSCOPY, (VATS) Right 05/2011    RUL, RLL   . TONSILLECTOMY      CHILDHOOD        Family History:     Family History   Problem Relation Age of Onset   . Aplastic anemia Mother    . Hypertension Father    . Ovarian cancer Neg Hx    . Lung cancer Neg Hx    . Rectal cancer Neg Hx    . Colon cancer Neg Hx    . Breast cancer Neg Hx    . Cancer Neg Hx        Social History:     History   Smoking Status   . Former Smoker   . Packs/day: 1.50   . Years: 20.00   . Quit date: 04/17/1982   Smokeless Tobacco   . Never Used     Comment: CIGAR 5-6 TIMES PER YEAR     History   Alcohol Use   . 1.2 - 3.6 oz/week   . 2 - 6 Shots of liquor per week     History   Drug Use No       Allergies:   No Known Allergies    Medications:     Home Medications     Med List Status:  In Progress Set By: Della Goo, MD PhD at 12/28/2015  1:26 AM                atorvastatin (LIPITOR) 40 MG tablet     Take 20 mg by mouth every morning.        co-enzyme Q-10 30 MG capsule     Take 30 mg by mouth daily.     lisinopril (PRINIVIL,ZESTRIL) 10 MG tablet     Take 20 mg by mouth every morning.        Multiple Vitamin (MULTIVITAMIN) tablet     Take 1 tablet by mouth daily.     pantoprazole (PROTONIX) 40 MG tablet     Take 40 mg by mouth every morning.        tamsulosin (FLOMAX) 0.4 MG Cap     Take 0.4 mg by mouth daily.     tolterodine (DETROL LA) 2 MG 24 hr capsule     Take 2 mg by mouth daily.     vitamin B-12 (CYANOCOBALAMIN) 500 MCG tablet      Take 500 mcg by mouth daily. UNSURE OF THE DOSE          Method by which medications were confirmed on admission: Discussed with patient    Review of Systems:   He has chronic right knee pain and prior left knee replacement.  He has been having urinary flow and urgency problems and has been on Tamsulosin without much effect, recently started on Tolterodine.  He denies any fever, chills, fatigue, malaise, headache, numbness, tingling, focal weakness, palpitations, shortness of breath, cough, abdominal pain, nausea, vomiting, diarrhea, rash, or bleeding.  All other systems were  reviewed and are negative except: as above    Physical Exam:   Patient Vitals for the past 24 hrs:   BP Pulse Resp SpO2   12/27/15 2337 95/61 (!) 54 16 95 %     There is no height or weight on file to calculate BMI.  No intake or output data in the 24 hours ending 12/28/15 0133    General: awake, alert, oriented x 3; no acute distress  HEENT: anicteric sclerae, PERRL, EOMI; oropharynx clear, MMM  Neck: supple, no lymphadenopathy, no thyromegaly, no JVD, no carotid bruits  Cardiovascular: regular rate and rhythm; normal S1 and S2; no murmurs, rubs, or gallops appreciated  Lungs: no respiratory distress or accessory muscle use; clear to auscultation bilaterally, without wheezing, rhonchi, or rales  Abdomen: normoactive bowel sounds; soft, non-distended, no palpable masses; non-tender to palpation, no rebound or guarding  Extremities: warm, distal pulses 2+ bilaterally; no LE edema; no clubbing or cyanosis  Neuro: cranial nerves grossly intact, symmetric facial movements, clear speech, moving all extremities  Skin: no concerning rashes noted    Labs:     Results     Procedure Component Value Units Date/Time    Lipid panel [[id number]]  (Abnormal) Collected:  12/28/15 0153    Specimen:  Blood Updated:  12/28/15 0326     Cholesterol 127 mg/dL      Triglycerides 62 mg/dL      HDL 39 (L) mg/dL      LDL Calculated 76 mg/dL      VLDL Cholesterol Cal 12  mg/dL      CHOL/HDL Ratio 3.3    Hemolysis index [[id number]] Collected:  12/28/15 0153     Updated:  12/28/15 0326     Hemolysis Index 10    Hemoglobin A1C [[id number]] Collected:  12/28/15 0153    Specimen:  Blood Updated:  12/28/15 0310    TSH [[id number]] Collected:  12/28/15 0153    Specimen:  Blood Updated:  12/28/15 0255     Thyroid Stimulating Hormone 4.02 uIU/mL     CBC without differential [[id number]]  (Abnormal) Collected:  12/28/15 0153    Specimen:  Blood from Blood Updated:  12/28/15 0251     WBC 2.58 (L) x10 3/uL      Hgb 10.3 (L) g/dL      Hematocrit 87.5 (L) %      Platelets 78 (L) x10 3/uL      RBC 2.98 (L) x10 6/uL      MCV 101.3 (H) fL      MCH 34.6 (H) pg      MCHC 34.1 g/dL      RDW 15 %      MPV 9.4 fL      Nucleated RBC 0.0 /100 WBC      Absolute NRBC 0.00 x10 3/uL     Troponin I [[id number]] Collected:  12/28/15 0153    Specimen:  Blood Updated:  12/28/15 0241     Troponin I 0.03 ng/mL     Basic Metabolic Panel [[id number]] Collected:  12/28/15 0153    Specimen:  Blood Updated:  12/28/15 0237     Glucose 90 mg/dL      BUN 16.0 mg/dL      Creatinine 1.2 mg/dL      Calcium 8.9 mg/dL      Sodium 109 mEq/L      Potassium 3.9 mEq/L      Chloride 107 mEq/L      CO2 23 mEq/L  Magnesium [[id number]] Collected:  12/28/15 0153    Specimen:  Blood Updated:  12/28/15 0237     Magnesium 2.1 mg/dL     GFR [[id number]] Collected:  12/28/15 0153     Updated:  12/28/15 0237     EGFR 59.4          Imaging personally reviewed, including:   # CXR 2 Views (12/27/15): No focal consolidation within the lungs. Pulmonary vasculature within normal limits. No pleural effusion or pneumothorax. Cardiomediastinal contour unremarkable. Diffuse degenerative changes of the spine. IMPRESSION: No acute finding.  # CTA Chest/Abdomen (12/27/15): Focal outpouching distal abdominal aorta to the left just above the bifurcation likely representing a saccular aneurysm or penetrating aortic ulcer. Just superiorly there is focal ectasia of  distal abdominal aorta. Rest of aorta is of normal caliber. Ectasia bilateral common iliac arteries. Mild opacity anterior right middle lobe nonspecific and may be infectious inflammatory or scarring. Low density lesion right hepatic lobe not characterized. Suggestion of some peripheral enhancement and may represent hemangioma. Further evaluation with MRI recommended. Colonic diverticulosis.        Safety Checklist  DVT prophylaxis:  CHEST guideline (See page e199S) Mechanical and Chemical and / or mechanical ppx NOT indicated or contraindicated: Fully anticoagulated    Foley:  [place] Rn Foley protocol Not present   IVs:  Peripheral IV   PT/OT: Not needed   Daily CBC & or Chem ordered:  SHM/ABIM guidelines (see #5) No   Reference for approximate charges of common labs: CBC auto diff - $76  BMP - $99  Mg - $79    Signed by: Della Goo, MD   NW:GNFA, Lottie Mussel, MD

## 2015-12-28 NOTE — Plan of Care (Signed)
Problem: Safety  Goal: Patient will be free from injury during hospitalization  Outcome: Progressing  VSS on RA. Patient is A&O x 4. In and out between A-Fib and Sinus [place] Foley on tele. Denies pain, SOB and other discomfort. Independent and ambulatory.        Plan: Echo ordered. MRI of liver ordered and survey completed. Lower and upper extremity dopplers ordered. Vascular following for AAA. Lovenox. I's and O's. Low falls risk. Call bell within reach. Will continue to monitor and conduct hourly rounding.

## 2015-12-29 ENCOUNTER — Other Ambulatory Visit: Payer: Self-pay

## 2015-12-29 DIAGNOSIS — R0609 Other forms of dyspnea: Secondary | ICD-10-CM

## 2015-12-29 MED ORDER — APIXABAN 5 MG PO TABS
5.0000 mg | ORAL_TABLET | Freq: Two times a day (BID) | ORAL | Status: DC
Start: 2015-12-29 — End: 2015-12-29

## 2015-12-29 MED ORDER — POLYETHYLENE GLYCOL 3350 17 G PO PACK
17.0000 g | PACK | Freq: Every day | ORAL | Status: DC
Start: 2015-12-29 — End: 2015-12-29
  Administered 2015-12-29: 17 g via ORAL
  Filled 2015-12-29: qty 1

## 2015-12-29 MED ORDER — APIXABAN 5 MG PO TABS
5.0000 mg | ORAL_TABLET | Freq: Two times a day (BID) | ORAL | 0 refills | Status: AC
Start: 2015-12-29 — End: 2016-01-28
  Filled 2015-12-29: qty 60, 30d supply, fill #0

## 2015-12-29 MED ORDER — SENNOSIDES-DOCUSATE SODIUM 8.6-50 MG PO TABS
2.0000 | ORAL_TABLET | Freq: Two times a day (BID) | ORAL | Status: DC
Start: 2015-12-29 — End: 2015-12-29
  Administered 2015-12-29: 2 via ORAL
  Filled 2015-12-29: qty 2

## 2015-12-29 NOTE — Progress Notes (Addendum)
MEDICINE PROGRESS NOTE    Date Time: 12/29/15 3:14 PM  Patient Name: Brent [name],Brent [name]  Attending Physician: Birdena Crandall, MD    Assessment:     # Paroxysmal Afib -  New  # Saccular Infrarenal Aortic Aneurysm  # Liver Hemangioma  # Possible h/o PE  # Hypertension  # Hyperlipidemia  # Pancytopenia  # GERD  # BPH      Patient is a 75 years old male with h/o HTN, HLD, and BPH admitted to the hospital with new onset AFib. Patient had CT chest and abdomen that showed liver hemangioma and saccular infrarenal AA. Vascular surgery was consulted, recommendation for outpatient elective surgery. Cardiology was consulted. Patient had Echocardiogram that showed normal systolic function, LVEF 60%, and Grade II diastolic dysfunction.    Plan:     # New Diagnosis Paroxysmal Atrial Fibrillation: He was found to be in AFib with RVR and HR 120 on arrival in the ED.  He subsequently converted back to sinus rhythm with HR 60s.  He has had negative Troponin x2 and low BNP 84.8 on admission.  -- Cardiology is following.   -- Echocardiogram results were pending this AM  -- Continue Full dose AC and possibly transition to Eliquis at discharge    # Saccular Infrarenal Aortic Aneurysm:   - CTA chest/abdomen showed a saccular infrarenal AAA and ectasia of the bilateral common iliac arteries.   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    - He is followed by Dr. Tedra Coupe in Pulmonology as outpatient.   -- Not currently on medication    # HTN / HLD:   -- Continue home Atorvastatin 20 mg PO QAM  -- Continue home Lisinopril 20 mg PO QAM    # Pancytopenia: He has a history of stable pancytopenia and recently had a bone marrow biopsy  without a clear cause identified.   -- Trend CBC daily  -- Monitor for bleeding    # GERD: Stable without acute symptoms.  -- Continue home Pantoprazole 40 mg PO QAM    # BPH: Stable.  -- Continue home Tamsulosin 0.4 mg PO daily  -- Continue home Tolterodine 2 mg PO daily    # Constipation  -- will start Mirlax daily and pericolace       Case discussed with: Patient and RN    Safety Checklist:     DVT prophylaxis:  CHEST guideline (See page e199S) Chemical and Mechanical   Foley:  [place] Rn Foley protocol Not present   IVs:  Peripheral IV   PT/OT: Not needed   Daily CBC & or Chem ordered:  SHM/ABIM guidelines (see #5) Will d/c as no longer needed   Reference for approximate charges of common labs: CBC auto diff - $76  BMP - $99  Mg - $79    Lines:     Patient Lines/Drains/Airways Status    Active PICC Line / CVC Line / PIV Line / Drain / Airway / Intraosseous Line / Epidural Line / ART Line / Line / Wound / Pressure Ulcer / NG/OG Tube     Name:   Placement date:  Placement time:   Site:   Days:    Peripheral IV 12/27/15 Right Antecubital  12/27/15    1546    Antecubital    1                 Disposition: (Please see PAF column for Expected D/C Date)   Today's date: 12/29/2015  Admit Date: 12/27/2015 11:30 PM  LOS: 2  Clinical Milestones:   Cardio consult    Anticipated discharge needs: TBD    Subjective     CC: AFib w/ RVR    Interval History/24 hour events: No acute overnight events    HPI/Subjective:   Patient is doing well this AM. Denies any CP, SOB, N/V. + constipation    Review of Systems:   As per HPI    Physical Exam:     VITAL SIGNS PHYSICAL EXAM   Temp:  [96.8 [name] (36 C)-99.2 [name] (37.3 C)] 97.5 [name] (36.4 C)  Heart Rate:  [60-73] 63  Resp Rate:  [16-18] 16  BP: (92-113)/(53-70) 113/70    Intake/Output Summary (Last 24 hours) at 12/29/15 1514  Last data filed at 12/29/15 1348   Gross per 24 hour   Intake              450 ml   Output             1550 ml   Net            -1100 ml    Physical Exam  General:  AAO*3, NAD  Cardiovascular: regular rate and rhythm, no murmurs, rubs or gallops  Lungs: clear to auscultation bilaterally, without wheezing, rhonchi, or rales  Abdomen: soft, non-tender, non-distended; no palpable masses,  normoactive bowel sounds  Extremities: no edema, moves all 4 EXTs       Meds:     Medications were reviewed:    Labs:     Labs (last 72 hours):      Recent Labs  Lab 12/28/15  0153 12/27/15  1545   WBC 2.58* 2.54*   Hgb 10.3* 11.2*   Hematocrit 30.2* 33.2*   Platelets 78* 84*         Recent Labs  Lab 12/27/15  1545   PT 14.1   PT INR 1.1   PTT 32      Recent Labs  Lab 12/28/15  0153 12/27/15  1545   Sodium 137 137   Potassium 3.9 4.0   Chloride 107 102   CO2 23 22   BUN 26.0 29.8*   Creatinine 1.2 1.4*   Calcium 8.9 9.9   Albumin  --  4.4   Protein, Total  --  7.3   Bilirubin, Total  --  1.1   Alkaline Phosphatase  --  48   ALT  --  23   AST (SGOT)  --  18   Glucose 90 103*          Microbiology, reviewed    Imaging, reviewed      Signed by: Birdena Crandall, MD

## 2015-12-29 NOTE — Plan of Care (Addendum)
Problem: Safety  Goal: Patient will be free from injury during hospitalization  Outcome: Progressing   Patient is A&O x 4   VSS, BP 92/53, HR 50s-60s, between afib and sinus brady on Tele,    O2 95% on RA   Patient denies pain or any needs at this time    Plan  Monitor I/Os, elec, and weight  Echo  Monitor platelets  Possible stress test outpatient    Goal: Patient will be free from infection during hospitalization  Outcome: Progressing      Problem: Pain  Goal: Pain at adequate level as identified by patient  Outcome: Progressing      Problem: Side Effects from Pain Analgesia  Goal: Patient will experience minimal side effects of analgesic therapy  Outcome: Progressing      Problem: Discharge Barriers  Goal: Patient will be discharged home or other facility with appropriate resources  Outcome: Progressing      Problem: Psychosocial and Spiritual Needs  Goal: Demonstrates ability to cope with hospitalization/illness  Outcome: Progressing      Problem: Hemodynamic Status: Cardiac  Goal: Stable vital signs and fluid balance  Outcome: Progressing      Problem: Inadequate Tissue Perfusion  Goal: Adequate tissue perfusion will be maintained  Outcome: Progressing      Problem: Ineffective Gas Exchange  Goal: Effective breathing pattern  Outcome: Progressing

## 2015-12-29 NOTE — Discharge Instr - AVS First Page (Signed)
Reason for your Hospital Admission:  Atrial Fibrillation - Paroxysmal      Instructions for after your discharge:  - continue home medications  - start taking Eliquis twice a day to prevent stroke and blood clots in lung  - follow up with your PCP in one week after discharge

## 2015-12-29 NOTE — Progress Notes (Addendum)
SW notified De Nurse weekend [facility] [id number] that the pt is on the Medicare Focus List and will need [facility] RN.     SW cannot schedule a PCP f/u apt due to the weekend. HH RN will need to make 48 hour touch.    D/c checklist is completed.    Shirlean Mylar, MSW  Case Management  CTUS  [facility]  [phone]

## 2015-12-29 NOTE — Progress Notes (Addendum)
Vascular Surgery Progress Note    24h: NAEON.  Remains in NSR.  Ultrasounds completed, MRI liver completed.    Vitals:    12/29/15 0842   BP: 103/63   Pulse:    Resp: 16   Temp: 97.5 F (36.4 C)   SpO2: 94%       Gen: NAD  CV: RRR  Pulm: CTAB    Results     ** No results found for the last 24 hours. **        Mri Liver W Wo Contrast    Result Date: 12/28/2015  1. 4.6 x 3.0 cm benign hemangioma in the posterior segment of the right hepatic lobe corresponding with the finding on CTA. 2. Small splenic lesion, present retrospect in 2011, likely to be benign. 3. Aneurysm of the infrarenal abdominal aorta, partially included on this scan, as was demonstrated on recent CT. Wynema Birch, MD 12/28/2015 5:46 PM     US Carotid Duplex Doppler Complete    Result Date: 12/28/2015  1. 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 2. Small amount of plaque in right carotid bulb and right common carotid artery. 3. No evidence of hemodynamically significant stenosis of either internal carotid artery.  Pennelope Bracken, MD 12/28/2015 3:31 PM     US Arterial / Graft Duplex Doppler Lower Extremity Bilateral Complete    Result Date: 12/28/2015   Ectasia of bilateral common femoral arteries. Ectasia of bilateral popliteal arteries, without discrete aneurysm. Normal triphasic flow in arterial vasculature throughout both lower extremities. Barbaraann Faster, MD 12/28/2015 4:49 PM       I/R: 75yo M with saccular infrarenal aortic aneurysm.  Carotid, femoral, and popliteal duplexes demonstrated nothing that requires operative or endovascular intervention.    -will f/u on planning for repair of AAA, likely as an elective procedure  -mgmt per primary team    [facility]  General Surgery PGY-4    Concur.  Plan EVAR for saccular AAA on an outpatient basis.  Asymptomatic from AAA standpoint.   Will order carotid duplex, bilateral femoral-popliteal duplex, ABIs, and cardiology clearance in preoperative preparation for EVAR.  Needs chest pain workup and new onset atrial fibrillation management first, prior to outpatient EVAR.  My office will set up the surgery.    Cherrish Vitali Lutricia Horsfall, MD, FACS, RPVI  Vascular and Endovascular Surgery   [facility]   Director - [place] [facility]  Director - [facility] Vascular Quality Initiative   Pager:  [phone]  Tel:  [phone]

## 2015-12-29 NOTE — Plan of Care (Signed)
Received discharge order. Porter'ed IV and tele. AVS printed and explained to pt. Pt will start Eliquis 5mg  BID. Will do outpt follow up for AAA. Stress test out pt follow up. Pharmacy will bring eliquis. VSS.

## 2015-12-29 NOTE — Discharge Summary (Signed)
PROGRESS NOTE/DISCHARGE SUMMARY      Date Time: 12/29/15 3:52 PM  Patient Name: Brent [name],Brent [name]  Attending Physician: Brent Crandall, MD  Primary Care Physician: Brent Livings, MD    Date of Admission:   12/27/2015    Date of Discharge:    12/29/15    Discharge Dx:     Patient Active Problem List    Diagnosis Date Noted   . Atrial fibrillation 12/28/2015   . 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 Thrombocytopenia 01/26/2015   . Neutropenia 01/26/2015   . Dyspnea on exertion 06/23/2011   . HTN (hypertension) 06/23/2011   . Hyperlipidemia 06/23/2011   . On home oxygen therapy 06/23/2011   . History of pulmonary embolism 06/23/2011   . GERD (gastroesophageal reflux disease) 06/23/2011        Consultations:   Treatment Team: Attending Provider: Birdena Crandall, MD; Registered Nurse: Brent Herter, RN; Technician: Brent [name]; Registered Nurse: Brent Bill, RN     Procedures/Radiology performed:     Results     Procedure Component Value Units Date/Time    Hemoglobin A1C [[id number]] Collected:  12/28/15 0153    Specimen:  Blood Updated:  12/28/15 0940     Hemoglobin A1C 5.3 %      Average Estimated Glucose 105.4 mg/dL     Lipid panel [[id number]]  (Abnormal) Collected:  12/28/15 0153    Specimen:  Blood Updated:  12/28/15 0326     Cholesterol 127 mg/dL      Triglycerides 62 mg/dL      HDL 39 (L) mg/dL      LDL Calculated 76 mg/dL      VLDL Cholesterol Cal 12 mg/dL      CHOL/HDL Ratio 3.3    Hemolysis index [[id number]] Collected:  12/28/15 0153     Updated:  12/28/15 0326     Hemolysis Index 10    TSH [[id number]] Collected:  12/28/15 0153    Specimen:  Blood Updated:  12/28/15 0255     Thyroid Stimulating Hormone 4.02 uIU/mL     CBC without differential [[id number]]  (Abnormal) Collected:  12/28/15 0153    Specimen:  Blood from Blood Updated:  12/28/15 0251     WBC 2.58 (L) x10 3/uL      Hgb 10.3 (L) g/dL      Hematocrit 30.1 (L) %      Platelets 78 (L) x10 3/uL      RBC 2.98 (L) x10  6/uL      MCV 101.3 (H) fL      MCH 34.6 (H) pg      MCHC 34.1 g/dL      RDW 15 %      MPV 9.4 fL      Nucleated RBC 0.0 /100 WBC      Absolute NRBC 0.00 x10 3/uL     Troponin I [[id number]] Collected:  12/28/15 0153    Specimen:  Blood Updated:  12/28/15 0241     Troponin I 0.03 ng/mL     Basic Metabolic Panel [[id number]] Collected:  12/28/15 0153    Specimen:  Blood Updated:  12/28/15 0237     Glucose 90 mg/dL      BUN 27.0 mg/dL      Creatinine 1.2 mg/dL      Calcium 8.9 mg/dL      Sodium 623 mEq/L      Potassium 3.9 mEq/L      Chloride 107 mEq/L  CO2 23 mEq/L     Magnesium [[id number]] Collected:  12/28/15 0153    Specimen:  Blood Updated:  12/28/15 0237     Magnesium 2.1 mg/dL     GFR [[id number]] Collected:  12/28/15 0153     Updated:  12/28/15 0237     EGFR 59.4            Hospital Course:     Patient is a 75 years old male with h/o HTN, HLD, and BPH admitted to the hospital with new onset AFib. Patient had CT chest and abdomen that showed liver hemangioma and saccular infrarenal AA. Vascular surgery was consulted, recommendation for outpatient elective surgery. Cardiology was consulted, Recommends Elquis at discharge. Patient had Echocardiogram that showed normal systolic function, LVEF 60%, and Grade II diastolic dysfunction. Patient remained hemodynamically stable and discharged home.      DISCHARGE DAY EXAM:  Patient Vitals for the past 24 hrs:   BP Temp Temp src Pulse Resp SpO2 Height   12/29/15 1133 113/70 - Oral 63 16 98 % -   12/29/15 0842 103/63 97.5 [name] (36.4 C) Oral 73 16 94 % -   12/29/15 0541 92/53 (!) 96.8 [name] (36 C) Axillary - 16 95 % -   12/28/15 2325 96/56 99.2 [name] (37.3 C) Oral 63 18 95 % -   12/28/15 1921 97/59 - - 60 18 93 % -   12/28/15 1600 - - - - - - 1.727 m (5\' 8" )     Body mass index is 33.51 kg/m.  General: awake, alert, oriented x 3; no acute distress.  Cardiovascular: regular rate and rhythm, no murmurs, rubs or gallops  Lungs: clear to auscultation bilaterally, without  wheezing, rhonchi, or rales  Abdomen: soft, non-tender, non-distended; normoactive bowel sounds, no rebound or guarding  Extremities: no clubbing, cyanosis, or edema    Discharge Medications:        Medication List      START taking these medications    apixaban 5 MG  Commonly known as:  ELIQUIS  Take 1 tablet (5 mg total) by mouth every 12 (twelve) hours.        CONTINUE taking these medications    atorvastatin 40 MG tablet  Commonly known as:  LIPITOR     CO Q 10 PO     lisinopril 10 MG tablet  Commonly known as:  PRINIVIL,ZESTRIL     multivitamin tablet     pantoprazole 40 MG tablet  Commonly known as:  PROTONIX     solifenacin 5 MG tablet  Commonly known as:  VESICARE     tamsulosin 0.4 MG Caps  Commonly known as:  FLOMAX     tolterodine 2 MG 24 hr capsule  Commonly known as:  DETROL LA        STOP taking these medications    PREDNISONE PO           Where to Get Your Medications      These medications were sent to [facility]  [address], [place] [address]    Hours:  Monday-Friday 8A to 8P, Saturday/Sunday 9A to 5P Phone:  [phone]    apixaban 5 MG         (FYI: you must refresh the link after final D/C Med reconciliation)    Discharge Instructions:   Stable    Disposition:  home    Patient was instructed to follow up with Primary Care Doctor Long, Lottie Mussel,  MD in 3-5 days and with cardiology in 2 weeks    Minutes spent coordinating discharge and reviewing discharge plan: 35 minutes      Signed by: Brent Crandall, MD    CC: Long, Lottie Mussel, MD

## 2016-01-01 ENCOUNTER — Telehealth: Payer: Self-pay

## 2016-01-01 NOTE — Telephone Encounter (Signed)
Attempted discharge phone, no answer, left VM to call [phone] for questions/concerns.

## 2016-01-08 ENCOUNTER — Ambulatory Visit (HOSPITAL_BASED_OUTPATIENT_CLINIC_OR_DEPARTMENT_OTHER): Payer: Medicare Other | Admitting: Hematology & Oncology

## 2016-01-16 ENCOUNTER — Ambulatory Visit (HOSPITAL_BASED_OUTPATIENT_CLINIC_OR_DEPARTMENT_OTHER): Payer: Medicare Other | Admitting: Hematology & Oncology

## 2016-01-29 ENCOUNTER — Ambulatory Visit (FREE_STANDING_LABORATORY_FACILITY): Payer: Medicare Other | Admitting: Hematology & Oncology

## 2016-01-29 ENCOUNTER — Encounter (HOSPITAL_BASED_OUTPATIENT_CLINIC_OR_DEPARTMENT_OTHER): Payer: Self-pay | Admitting: Hematology & Oncology

## 2016-01-29 VITALS — BP 106/67 | HR 68 | Temp 98.1°F | Resp 18

## 2016-01-29 DIAGNOSIS — D61818 Other pancytopenia: Secondary | ICD-10-CM

## 2016-01-29 LAB — COMPREHENSIVE METABOLIC PANEL
ALT: 20 U/L (ref 0–55)
AST (SGOT): 17 U/L (ref 5–34)
Albumin/Globulin Ratio: 1.4 (ref 0.9–2.2)
Albumin: 4.2 g/dL (ref 3.5–5.0)
Alkaline Phosphatase: 52 U/L (ref 38–106)
BUN: 22 mg/dL (ref 9.0–28.0)
Bilirubin, Total: 0.9 mg/dL (ref 0.1–1.2)
CO2: 24 mEq/L (ref 21–30)
Calcium: 9.8 mg/dL (ref 7.9–10.2)
Chloride: 106 mEq/L (ref 100–111)
Creatinine: 1.1 mg/dL (ref 0.5–1.5)
Globulin: 3 g/dL (ref 2.0–3.7)
Glucose: 106 mg/dL — ABNORMAL HIGH (ref 70–100)
Potassium: 3.9 mEq/L (ref 3.5–5.1)
Protein, Total: 7.2 g/dL (ref 6.0–8.3)
Sodium: 141 mEq/L (ref 135–146)

## 2016-01-29 LAB — CBC AND DIFFERENTIAL
Absolute NRBC: 0 10*3/uL
Basophils Absolute Automated: 0.01 10*3/uL (ref 0.00–0.20)
Basophils Automated: 0.4 %
Eosinophils Absolute Automated: 0.02 10*3/uL (ref 0.00–0.70)
Eosinophils Automated: 0.9 %
Hematocrit: 34 % — ABNORMAL LOW (ref 42.0–52.0)
Hgb: 11.5 g/dL — ABNORMAL LOW (ref 13.0–17.0)
Immature Granulocytes Absolute: 0.01 10*3/uL
Immature Granulocytes: 0.4 %
Lymphocytes Absolute Automated: 0.78 10*3/uL (ref 0.50–4.40)
Lymphocytes Automated: 33.5 %
MCH: 35.3 pg — ABNORMAL HIGH (ref 28.0–32.0)
MCHC: 33.8 g/dL (ref 32.0–36.0)
MCV: 104.3 fL — ABNORMAL HIGH (ref 80.0–100.0)
MPV: 9.1 fL — ABNORMAL LOW (ref 9.4–12.3)
Monocytes Absolute Automated: 0.08 10*3/uL (ref 0.00–1.20)
Monocytes: 3.4 %
Neutrophils Absolute: 1.43 10*3/uL — ABNORMAL LOW (ref 1.80–8.10)
Neutrophils: 61.4 %
Nucleated RBC: 0 /100 WBC (ref 0.0–1.0)
Platelets: 81 10*3/uL — ABNORMAL LOW (ref 140–400)
RBC: 3.26 10*6/uL — ABNORMAL LOW (ref 4.70–6.00)
RDW: 16 % — ABNORMAL HIGH (ref 12–15)
WBC: 2.33 10*3/uL — ABNORMAL LOW (ref 3.50–10.80)

## 2016-01-29 LAB — GFR: EGFR: >60

## 2016-01-29 LAB — HEMOLYSIS INDEX: Hemolysis Index: 5 (ref 0–18)

## 2016-01-29 NOTE — Progress Notes (Signed)
[facility] Office Visit Note      Patient Name: Brent [name], Brent [name]      Interval history:   Brent [name] is here for a follow up visit. He has no complaints. Physically active. He denies bruising bleeding fatigue or night sweats. He recently was started on Apixaban because of Atrial fib.    Medications:     No outpatient prescriptions have been marked as taking for the 01/29/16 encounter (Office Visit) with Acquanetta Sit, MD.         Past History:   No Known Allergies    Past Medical History:   Diagnosis Date   . Anemia     PANCYTOPENIA-SEE DR [facility] NOTE IN [facility] 06-25-2015   . Arthritis     RIGHT KNEE-CORTISONE SHOT 04/03/15, LT TOTAL KNEE 2011, ACL RT KNEE 1988   . 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 Chronic gout     LAST FLARE UP 206, NO MEDS X 1 YR FOR IT   . Clotting disorder     04/2011, PE TOOK COUMADIN X 3 MTS   . Disorder of prostate     BPH -NO MEDS   . Fractures     RT TIBI/FIB-CLOSED REDUCTION 1973,    . GERD (gastroesophageal reflux disease)     MED EFFECTIVE -TAKES PROTONIX   . 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 Hiatal hernia    . Hyperlipidemia    . Hypertensive disorder     CONTROLLED    . Interstitial lung disease DX 2013   . 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 On home O2 09/2010    @night  2 liters, 88-96% AFTER PNUEMONIA. FOR A FEW MTS NONE SINCE   . Pneumonia 2013   . Pulmonary embolus 2013    TREATED IN [facility] X 3 MONTHS   . Reflux esophagitis         Review of Systems:   A comprehensive review of systems was:   General ROS: positive for - minimal fatigue  negative for - chills or fever  Psychological ROS: negative for - anxiety or depression  Hematological and Lymphatic ROS: positive for - blood clots  negative for - bleeding problems, night sweats or swollen lymph nodes  Respiratory ROS: positive for - cough, shortness of breath and  tachypnea  Cardiovascular ROS: no chest pain or dyspnea on exertion  Gastrointestinal ROS: no abdominal pain, change in bowel habits, or black or bloody stools  Genito-Urinary ROS: no dysuria, trouble voiding, or hematuria  Neurological ROS: negative for - bowel and bladder control changes, impaired coordination/balance or tremors      Physical Exam:     Vitals:    01/29/16 1307   BP: 106/67   Pulse: 68   Resp: 18   Temp: 98.1 F (36.7 C)   SpO2: 96%       General appearance - alert, well appearing, and in no distress  Mental status - alert, oriented to person, place, and time  Eyes - pupils equal and reactive, extraocular eye movements intact  Ears - bilateral TM's and external ear canals normal  Mouth - mucous membranes moist, pharynx normal without lesions  Neck - supple, no significant adenopathy  Lymphatics - no palpable lymphadenopathy, no hepatosplenomegaly  Chest - clear to  auscultation, no wheezes, rales or rhonchi, symmetric air entry  Heart - normal rate, regular rhythm, normal S1, S2, no murmurs, rubs, clicks or gallops  Abdomen - soft, nontender, nondistended, no masses or organomegaly  Musculoskeletal - no joint tenderness, deformity or swelling  Extremities - peripheral pulses normal, no pedal edema, no clubbing or cyanosis          Labs:     Office Visit on 01/29/2016   Component Date Value Ref Range Status   . WBC 01/29/2016 2.33* 3.50 - 10.80 x10 3/uL Final   . Hgb 01/29/2016 11.5* 13.0 - 17.0 g/dL Final   . Hematocrit 16/12/9602 34.0* 42.0 - 52.0 % Final   . Platelets 01/29/2016 81* 140 - 400 x10 3/uL Final   . RBC 01/29/2016 3.26* 4.70 - 6.00 x10 6/uL Final   . MCV 01/29/2016 104.3* 80.0 - 100.0 fL Final   . MCH 01/29/2016 35.3* 28.0 - 32.0 pg Final   . MCHC 01/29/2016 33.8  32.0 - 36.0 g/dL Final   . RDW 54/11/8117 16* 12 - 15 % Final   . MPV 01/29/2016 9.1* 9.4 - 12.3 fL Final   . Neutrophils 01/29/2016 61.4  None % Final   . Lymphocytes Automated 01/29/2016 33.5  None % Final   . Monocytes  01/29/2016 3.4  None % Final   . Eosinophils Automated 01/29/2016 0.9  None % Final   . Basophils Automated 01/29/2016 0.4  None % Final   . Immature Granulocyte 01/29/2016 0.4  None % Final   . Nucleated RBC 01/29/2016 0.0  0.0 - 1.0 /100 WBC Final   . Neutrophils Absolute 01/29/2016 1.43* 1.80 - 8.10 x10 3/uL Final   . Abs Lymph Automated 01/29/2016 0.78  0.50 - 4.40 x10 3/uL Final   . Abs Mono Automated 01/29/2016 0.08  0.00 - 1.20 x10 3/uL Final   . Abs Eos Automated 01/29/2016 0.02  0.00 - 0.70 x10 3/uL Final   . Absolute Baso Automated 01/29/2016 0.01  0.00 - 0.20 x10 3/uL Final   . Absolute Immature Granulocyte 01/29/2016 0.01  0 x10 3/uL Final   . Absolute NRBC 01/29/2016 0.00  0 x10 3/uL Final         Rads:   No results found.    Assessment:   Pancytopenia. 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 Cytopenias can be from marrow suppression from alcohol  H/O Laryngeal cancer treated with Radiation therapy 2004  Abdominal aneurysm (saccular aneurysm )  Plan:     Discussed with patient that he will not need any specific treatment  If his platelet counts drop below 20,000 or he becomes symptomatic with bruising, bleeding, may consider treating him for his low platelets  Possible surgery for saccular aneurysm Dec 2017. May need platelets  Patient will follow back in 3 months time

## 2016-02-04 ENCOUNTER — Encounter (HOSPITAL_BASED_OUTPATIENT_CLINIC_OR_DEPARTMENT_OTHER): Payer: Self-pay

## 2016-02-28 ENCOUNTER — Ambulatory Visit: Payer: Medicare Other | Attending: Specialist

## 2016-02-28 ENCOUNTER — Other Ambulatory Visit: Payer: Self-pay | Admitting: Specialist

## 2016-02-28 DIAGNOSIS — I714 Abdominal aortic aneurysm, without rupture, unspecified: Secondary | ICD-10-CM

## 2016-02-28 LAB — BASIC METABOLIC PANEL
BUN: 23 mg/dL (ref 9.0–28.0)
CO2: 27 mEq/L (ref 21–29)
Calcium: 9.2 mg/dL (ref 7.9–10.2)
Chloride: 104 mEq/L (ref 100–111)
Creatinine: 1.1 mg/dL (ref 0.5–1.5)
Glucose: 87 mg/dL (ref 70–100)
Potassium: 4.3 mEq/L (ref 3.5–5.1)
Sodium: 141 mEq/L (ref 136–145)

## 2016-02-28 LAB — HEMOLYSIS INDEX: Hemolysis Index: 4 (ref 0–18)

## 2016-02-28 LAB — CBC AND DIFFERENTIAL
Absolute NRBC: 0 10*3/uL
Basophils Absolute Automated: 0 10*3/uL (ref 0.00–0.20)
Basophils Automated: 0 %
Eosinophils Absolute Automated: 0.02 10*3/uL (ref 0.00–0.70)
Eosinophils Automated: 0.8 %
Hematocrit: 35 % — ABNORMAL LOW (ref 42.0–52.0)
Hgb: 11.3 g/dL — ABNORMAL LOW (ref 13.0–17.0)
Immature Granulocytes Absolute: 0.01 10*3/uL
Immature Granulocytes: 0.4 %
Lymphocytes Absolute Automated: 1.02 10*3/uL (ref 0.50–4.40)
Lymphocytes Automated: 38.5 %
MCH: 34.2 pg — ABNORMAL HIGH (ref 28.0–32.0)
MCHC: 32.3 g/dL (ref 32.0–36.0)
MCV: 106.1 fL — ABNORMAL HIGH (ref 80.0–100.0)
MPV: 10.4 fL (ref 9.4–12.3)
Monocytes Absolute Automated: 0.11 10*3/uL (ref 0.00–1.20)
Monocytes: 4.2 %
Neutrophils Absolute: 1.49 10*3/uL — ABNORMAL LOW (ref 1.80–8.10)
Neutrophils: 56.1 %
Nucleated RBC: 0 /100 WBC (ref 0.0–1.0)
Platelets: 80 10*3/uL — ABNORMAL LOW (ref 140–400)
RBC: 3.3 10*6/uL — ABNORMAL LOW (ref 4.70–6.00)
RDW: 15 % (ref 12–15)
WBC: 2.65 10*3/uL — ABNORMAL LOW (ref 3.50–10.80)

## 2016-02-28 LAB — URINALYSIS REFLEX TO MICROSCOPIC EXAM - REFLEX TO CULTURE
Bilirubin, UA: NEGATIVE
Blood, UA: NEGATIVE
Glucose, UA: NEGATIVE
Ketones UA: NEGATIVE
Leukocyte Esterase, UA: NEGATIVE
Nitrite, UA: NEGATIVE
Protein, UR: NEGATIVE
Specific Gravity UA: 1.019 (ref 1.001–1.035)
Urine pH: 7 (ref 5.0–8.0)
Urobilinogen, UA: 0.2

## 2016-02-28 LAB — TYPE AND SCREEN
AB Screen Gel: NEGATIVE
ABO Rh: AB POS

## 2016-02-28 LAB — PT/INR
PT INR: 1.3 — ABNORMAL HIGH (ref 0.9–1.1)
PT: 16.5 s — ABNORMAL HIGH (ref 12.6–15.0)

## 2016-02-28 LAB — GFR: EGFR: >60

## 2016-02-28 NOTE — Pre-Procedure Instructions (Signed)
Called [facility]  For cardiac clearance and discuss pt has not responded to requests for PSS interview.  Spoke to [employer]. She will fax the cardiac clearance of 01/01/16;  Called Dr Micki Riley office - no answer - FAX sent for LOV (pulm)  Hx Pancytopenia, last cbc 01/29/16 - will need updated CBC <30days of DOS.

## 2016-02-28 NOTE — Pre-Procedure Instructions (Signed)
Needs an EKG ( by RN)

## 2016-02-29 ENCOUNTER — Ambulatory Visit: Payer: Medicare Other

## 2016-02-29 LAB — ECG 12-LEAD
Atrial Rate: 54 {beats}/min
P Axis: 19 degrees
P-R Interval: 146 ms
Q-T Interval: 442 ms
QRS Duration: 82 ms
QTC Calculation (Bezet): 419 ms
R Axis: 59 degrees
T Axis: 59 degrees
Ventricular Rate: 54 {beats}/min

## 2016-02-29 NOTE — Pre-Procedure Instructions (Signed)
   preop CXR (02/28/16) reviewed

## 2016-02-29 NOTE — Pre-Procedure Instructions (Signed)
   02/28/16 - EKG in Epic - sinus brady; no Afib   02/27/16 - BMP WDL; CBC - abnl - email sent to Nav [facility] and [facility]   01/07/16 - Cardiac Clearance in Epic   12/24/15 - LOV with Pulmonologist - Megan in Dr. Marton Redwood Russo's office will fax LOV and PFTs to [phone]   Eliquis plan (in Meds) - Last dose will be evening of 03/01/16 per cardiologist   06/01/15 - Stress test in Epic   Pt no longer uses or requires home Oxygen

## 2016-02-29 NOTE — Pre-Procedure Instructions (Signed)
Reviewed 02/18/16 labs/ekg.  Faxed to surgeon

## 2016-03-03 NOTE — Pre-Procedure Instructions (Signed)
   Incoming [phone] call from pt stating he wasn't feeling well over the weekend - had malaise and lethargy w/ slight congestion that lasted 36 hrs. States symptoms resolved. States he spoke w/ surgeon's office today and they said proceed w/ surgery.

## 2016-03-04 ENCOUNTER — Encounter
Admission: RE | Disposition: A | Discharge status: Home / Self Care | Payer: Self-pay | Source: Ambulatory Visit | Attending: Specialist

## 2016-03-04 ENCOUNTER — Inpatient Hospital Stay: Payer: Medicare Other | Admitting: Certified Registered"

## 2016-03-04 ENCOUNTER — Inpatient Hospital Stay: Payer: Medicare Other | Admitting: Specialist

## 2016-03-04 ENCOUNTER — Inpatient Hospital Stay
Admission: RE | Admit: 2016-03-04 | Discharge: 2016-03-05 | DRG: 269 | Disposition: A | Discharge status: Home / Self Care | Payer: Medicare Other | Source: Ambulatory Visit | Attending: Specialist | Admitting: Specialist

## 2016-03-04 ENCOUNTER — Encounter: Payer: Self-pay | Admitting: Anesthesiology

## 2016-03-04 ENCOUNTER — Inpatient Hospital Stay: Payer: Medicare Other

## 2016-03-04 DIAGNOSIS — I4891 Unspecified atrial fibrillation: Secondary | ICD-10-CM | POA: Diagnosis present

## 2016-03-04 DIAGNOSIS — Z85828 Personal history of other malignant neoplasm of skin: Secondary | ICD-10-CM

## 2016-03-04 DIAGNOSIS — Z9981 Dependence on supplemental oxygen: Secondary | ICD-10-CM

## 2016-03-04 DIAGNOSIS — J849 Interstitial pulmonary disease, unspecified: Secondary | ICD-10-CM | POA: Diagnosis present

## 2016-03-04 DIAGNOSIS — I10 Essential (primary) hypertension: Secondary | ICD-10-CM | POA: Diagnosis present

## 2016-03-04 DIAGNOSIS — E785 Hyperlipidemia, unspecified: Secondary | ICD-10-CM | POA: Diagnosis present

## 2016-03-04 DIAGNOSIS — Z7901 Long term (current) use of anticoagulants: Secondary | ICD-10-CM

## 2016-03-04 DIAGNOSIS — M1A9XX0 Chronic gout, unspecified, without tophus (tophi): Secondary | ICD-10-CM | POA: Diagnosis present

## 2016-03-04 DIAGNOSIS — Z01818 Encounter for other preprocedural examination: Secondary | ICD-10-CM

## 2016-03-04 DIAGNOSIS — K219 Gastro-esophageal reflux disease without esophagitis: Secondary | ICD-10-CM | POA: Diagnosis present

## 2016-03-04 DIAGNOSIS — Z86711 Personal history of pulmonary embolism: Secondary | ICD-10-CM

## 2016-03-04 DIAGNOSIS — Z8249 Family history of ischemic heart disease and other diseases of the circulatory system: Secondary | ICD-10-CM

## 2016-03-04 DIAGNOSIS — Z96652 Presence of left artificial knee joint: Secondary | ICD-10-CM | POA: Diagnosis present

## 2016-03-04 DIAGNOSIS — I700 Atherosclerosis of aorta: Secondary | ICD-10-CM | POA: Diagnosis present

## 2016-03-04 DIAGNOSIS — N400 Enlarged prostate without lower urinary tract symptoms: Secondary | ICD-10-CM | POA: Diagnosis present

## 2016-03-04 DIAGNOSIS — I714 Abdominal aortic aneurysm, without rupture, unspecified: Secondary | ICD-10-CM | POA: Diagnosis present

## 2016-03-04 DIAGNOSIS — Z87891 Personal history of nicotine dependence: Secondary | ICD-10-CM

## 2016-03-04 DIAGNOSIS — D709 Neutropenia, unspecified: Secondary | ICD-10-CM | POA: Diagnosis present

## 2016-03-04 HISTORY — PX: ENDOSTENT, ABDOMINAL AORTIC ANEURYSM (EVAR): SHX3867

## 2016-03-04 LAB — ABG WITH NA/K/CA IONIZED
Arterial Total CO2: 25.5 mEq/L (ref 24.0–30.0)
Base Excess, Arterial: 0.4 mEq/L (ref <=2.0)
Calcium, Ionized: 2.36 mEq/L (ref 2.30–2.58)
HCO3, Arterial: 24.3 mEq/L (ref 23.0–29.0)
O2 Sat, Arterial: 100 % (ref 95.0–100.0)
Temperature: 37
Whole Blood Potassium: 4.2 mEq/L (ref 3.5–5.3)
Whole Blood Sodium: 142 mEq/L (ref 136–146)
pCO2, Arterial: 38.3 mmHg (ref 35.0–45.0)
pH, Arterial: 7.418 (ref 7.350–7.450)
pO2, Arterial: 208 mmHg — ABNORMAL HIGH (ref 80.0–90.0)

## 2016-03-04 LAB — RED BLOOD CELLS OR HOLD
Expiration Date: 201801052359
Expiration Date: 201801052359
UTYPE: AB POS
UTYPE: AB POS

## 2016-03-04 LAB — CBC
Absolute NRBC: 0 10*3/uL
Hematocrit: 30.3 % — ABNORMAL LOW (ref 42.0–52.0)
Hgb: 10.1 g/dL — ABNORMAL LOW (ref 13.0–17.0)
MCH: 34.5 pg — ABNORMAL HIGH (ref 28.0–32.0)
MCHC: 33.3 g/dL (ref 32.0–36.0)
MCV: 103.4 fL — ABNORMAL HIGH (ref 80.0–100.0)
MPV: 9.5 fL (ref 9.4–12.3)
Nucleated RBC: 0 /100 WBC (ref 0.0–1.0)
Platelets: 66 10*3/uL — ABNORMAL LOW (ref 140–400)
RBC: 2.93 10*6/uL — ABNORMAL LOW (ref 4.70–6.00)
RDW: 14 % (ref 12–15)
WBC: 2.23 10*3/uL — ABNORMAL LOW (ref 3.50–10.80)

## 2016-03-04 LAB — BASIC METABOLIC PANEL
BUN: 19 mg/dL (ref 9.0–28.0)
CO2: 24 mEq/L (ref 22–29)
Calcium: 8.3 mg/dL (ref 7.9–10.2)
Chloride: 107 mEq/L (ref 100–111)
Creatinine: 1 mg/dL (ref 0.7–1.3)
Glucose: 113 mg/dL — ABNORMAL HIGH (ref 70–100)
Potassium: 4.1 mEq/L (ref 3.5–5.1)
Sodium: 138 mEq/L (ref 136–145)

## 2016-03-04 LAB — PLATELET COUNT: Platelets: 74 10*3/uL — ABNORMAL LOW (ref 140–400)

## 2016-03-04 LAB — GFR: EGFR: >60

## 2016-03-04 LAB — TOTAL HEMOGLOBIN GROUP
Hematocrit Total Calculated: 34.2 % — ABNORMAL LOW (ref 40.0–54.0)
Hemoglobin Total: 11.1 g/dL — ABNORMAL LOW (ref 13.0–17.0)

## 2016-03-04 LAB — MAGNESIUM: Magnesium: 2 mg/dL (ref 1.6–2.6)

## 2016-03-04 LAB — GLUCOSE WHOLE BLOOD: Whole Blood Glucose: 109 mg/dL — ABNORMAL HIGH (ref 70–100)

## 2016-03-04 SURGERY — ENDOSTENT, ABDOMINAL AORTIC ANEURYSM (EVAR)
Anesthesia: Anesthesia General | Site: Abdomen | Wound class: Clean

## 2016-03-04 MED ORDER — GLYCOPYRROLATE 0.2 MG/ML IJ SOLN
INTRAMUSCULAR | Status: DC | PRN
Start: 2016-03-04 — End: 2016-03-04
  Administered 2016-03-04: .6 mg via INTRAVENOUS

## 2016-03-04 MED ORDER — LISINOPRIL 20 MG PO TABS
20.0000 mg | ORAL_TABLET | Freq: Every day | ORAL | Status: DC
Start: 2016-03-04 — End: 2016-03-05
  Administered 2016-03-04: 20 mg via ORAL
  Filled 2016-03-04: qty 1

## 2016-03-04 MED ORDER — ROCURONIUM BROMIDE 10 MG/ML IV SOLN (WRAP)
INTRAVENOUS | Status: DC | PRN
Start: 2016-03-04 — End: 2016-03-04
  Administered 2016-03-04: 50 mg via INTRAVENOUS
  Administered 2016-03-04: 15 mg via INTRAVENOUS

## 2016-03-04 MED ORDER — GLYCOPYRROLATE 0.2 MG/ML IJ SOLN
INTRAMUSCULAR | Status: AC
Start: 2016-03-04 — End: ?
  Filled 2016-03-04: qty 1

## 2016-03-04 MED ORDER — FENTANYL CITRATE (PF) 50 MCG/ML IJ SOLN (WRAP)
25.0000 ug | INTRAMUSCULAR | Status: DC | PRN
Start: 2016-03-04 — End: 2016-03-04
  Administered 2016-03-04 (×2): 25 ug via INTRAVENOUS

## 2016-03-04 MED ORDER — MORPHINE SULFATE 2 MG/ML IJ/IV SOLN (WRAP)
2.0000 mg | Status: DC | PRN
Start: 2016-03-04 — End: 2016-03-05
  Administered 2016-03-04 – 2016-03-05 (×3): 2 mg via INTRAVENOUS
  Filled 2016-03-04 (×3): qty 1

## 2016-03-04 MED ORDER — PANTOPRAZOLE SODIUM 40 MG PO TBEC
40.0000 mg | DELAYED_RELEASE_TABLET | Freq: Every morning | ORAL | Status: DC
Start: 2016-03-05 — End: 2016-03-05
  Administered 2016-03-05: 40 mg via ORAL
  Filled 2016-03-04: qty 1

## 2016-03-04 MED ORDER — ONDANSETRON HCL 4 MG/2ML IJ SOLN
INTRAMUSCULAR | Status: AC
Start: 2016-03-04 — End: ?
  Filled 2016-03-04: qty 2

## 2016-03-04 MED ORDER — PHENYLEPHRINE 100 MCG/ML IN NACL 0.9% IV SOSY
PREFILLED_SYRINGE | INTRAVENOUS | Status: AC
Start: 2016-03-04 — End: ?
  Filled 2016-03-04: qty 5

## 2016-03-04 MED ORDER — NEOSTIGMINE METHYLSULFATE 1 MG/ML IJ SOLN
INTRAMUSCULAR | Status: AC
Start: 2016-03-04 — End: ?
  Filled 2016-03-04: qty 5

## 2016-03-04 MED ORDER — NICARDIPINE IV BOLUS SYRINGE (ANESTHESIA)
INTRAVENOUS | Status: AC
Start: 2016-03-04 — End: ?
  Filled 2016-03-04: qty 10

## 2016-03-04 MED ORDER — CEFUROXIME SODIUM 1.5 G IJ/IV SOLR (WRAP)
Status: DC | PRN
Start: 2016-03-04 — End: 2016-03-04
  Administered 2016-03-04: 1.5 g via INTRAVENOUS

## 2016-03-04 MED ORDER — ONDANSETRON HCL 4 MG/2ML IJ SOLN
4.0000 mg | Freq: Once | INTRAMUSCULAR | Status: DC | PRN
Start: 2016-03-04 — End: 2016-03-04

## 2016-03-04 MED ORDER — TAMSULOSIN HCL 0.4 MG PO CAPS
0.4000 mg | ORAL_CAPSULE | Freq: Every day | ORAL | Status: DC
Start: 2016-03-04 — End: 2016-03-05
  Administered 2016-03-04: 0.4 mg via ORAL
  Filled 2016-03-04: qty 1

## 2016-03-04 MED ORDER — PROPOFOL 10 MG/ML IV EMUL (WRAP)
INTRAVENOUS | Status: AC
Start: 2016-03-04 — End: ?
  Filled 2016-03-04: qty 20

## 2016-03-04 MED ORDER — FENTANYL CITRATE (PF) 50 MCG/ML IJ SOLN (WRAP)
INTRAMUSCULAR | Status: DC | PRN
Start: 2016-03-04 — End: 2016-03-04
  Administered 2016-03-04: 25 ug via INTRAVENOUS
  Administered 2016-03-04: 50 ug via INTRAVENOUS
  Administered 2016-03-04: 25 ug via INTRAVENOUS

## 2016-03-04 MED ORDER — ATORVASTATIN CALCIUM 10 MG PO TABS
20.0000 mg | ORAL_TABLET | Freq: Every day | ORAL | Status: DC
Start: 2016-03-05 — End: 2016-03-05
  Administered 2016-03-05: 20 mg via ORAL
  Filled 2016-03-04: qty 2

## 2016-03-04 MED ORDER — HEPARIN SODIUM (PORCINE) 1000 UNIT/ML IJ SOLN
INTRAMUSCULAR | Status: DC | PRN
Start: 2016-03-04 — End: 2016-03-04
  Administered 2016-03-04 (×2): 2000 [IU]

## 2016-03-04 MED ORDER — HEPARIN SODIUM (PORCINE) 1000 UNIT/ML IJ SOLN
INTRAMUSCULAR | Status: DC | PRN
Start: 2016-03-04 — End: 2016-03-04
  Administered 2016-03-04: 8000 [IU] via INTRAVENOUS

## 2016-03-04 MED ORDER — ESMOLOL HCL 100 MG/10ML IV SOLN
INTRAVENOUS | Status: AC
Start: 2016-03-04 — End: ?
  Filled 2016-03-04: qty 10

## 2016-03-04 MED ORDER — FENTANYL CITRATE (PF) 50 MCG/ML IJ SOLN (WRAP)
INTRAMUSCULAR | Status: AC
Start: 2016-03-04 — End: 2016-03-04
  Administered 2016-03-04: 25 ug via INTRAVENOUS
  Filled 2016-03-04: qty 2

## 2016-03-04 MED ORDER — HEPARIN SODIUM (PORCINE) 1000 UNIT/ML IJ SOLN
INTRAMUSCULAR | Status: AC
Start: 2016-03-04 — End: ?
  Filled 2016-03-04: qty 20

## 2016-03-04 MED ORDER — COLCHICINE 0.6 MG PO TABS
0.6000 mg | ORAL_TABLET | Freq: Once | ORAL | Status: AC
Start: 2016-03-04 — End: 2016-03-04
  Administered 2016-03-04: 0.6 mg via ORAL
  Filled 2016-03-04: qty 1

## 2016-03-04 MED ORDER — DIPHENHYDRAMINE HCL 50 MG/ML IJ SOLN
6.2500 mg | Freq: Four times a day (QID) | INTRAMUSCULAR | Status: DC | PRN
Start: 2016-03-04 — End: 2016-03-04

## 2016-03-04 MED ORDER — NEOSTIGMINE METHYLSULFATE 1 MG/ML IJ/IV SOLN (WRAP)
Status: DC | PRN
Start: 2016-03-04 — End: 2016-03-04
  Administered 2016-03-04: 4 mg via INTRAVENOUS

## 2016-03-04 MED ORDER — ROCURONIUM BROMIDE 50 MG/5ML IV SOLN
INTRAVENOUS | Status: AC
Start: 2016-03-04 — End: ?
  Filled 2016-03-04: qty 5

## 2016-03-04 MED ORDER — LIDOCAINE HCL (PF) 2 % IJ SOLN
INTRAMUSCULAR | Status: AC
Start: 2016-03-04 — End: ?
  Filled 2016-03-04: qty 5

## 2016-03-04 MED ORDER — LACTATED RINGERS IV SOLN
INTRAVENOUS | Status: DC | PRN
Start: 2016-03-04 — End: 2016-03-04

## 2016-03-04 MED ORDER — PROTAMINE SULFATE 10 MG/ML IV SOLN
INTRAVENOUS | Status: AC
Start: 2016-03-04 — End: ?
  Filled 2016-03-04: qty 10

## 2016-03-04 MED ORDER — LIDOCAINE HCL 2 % IJ SOLN
INTRAMUSCULAR | Status: DC | PRN
Start: 2016-03-04 — End: 2016-03-04
  Administered 2016-03-04: 25 mg

## 2016-03-04 MED ORDER — ACETAMINOPHEN 500 MG PO TABS
1000.0000 mg | ORAL_TABLET | Freq: Three times a day (TID) | ORAL | Status: DC
Start: 2016-03-04 — End: 2016-03-05
  Administered 2016-03-04 – 2016-03-05 (×4): 1000 mg via ORAL
  Filled 2016-03-04 (×4): qty 2

## 2016-03-04 MED ORDER — ESMOLOL HCL 100 MG/10ML IV SOLN
INTRAVENOUS | Status: DC | PRN
Start: 2016-03-04 — End: 2016-03-04
  Administered 2016-03-04 (×2): 30 mg via INTRAVENOUS

## 2016-03-04 MED ORDER — PHENYLEPHRINE 100 MCG/ML IN NACL 0.9% IV SOSY
PREFILLED_SYRINGE | INTRAVENOUS | Status: AC
Start: 2016-03-04 — End: ?
  Filled 2016-03-04: qty 10

## 2016-03-04 MED ORDER — PROMETHAZINE HCL 25 MG/ML IJ SOLN
6.2500 mg | Freq: Once | INTRAMUSCULAR | Status: DC | PRN
Start: 2016-03-04 — End: 2016-03-04

## 2016-03-04 MED ORDER — PROTAMINE SULFATE 10 MG/ML IV SOLN
INTRAVENOUS | Status: DC | PRN
Start: 2016-03-04 — End: 2016-03-04
  Administered 2016-03-04: 80 mg via INTRAVENOUS

## 2016-03-04 MED ORDER — PROPOFOL INFUSION 10 MG/ML
INTRAVENOUS | Status: DC | PRN
Start: 2016-03-04 — End: 2016-03-04
  Administered 2016-03-04 (×3): 50 mg via INTRAVENOUS
  Administered 2016-03-04: 100 mg via INTRAVENOUS

## 2016-03-04 MED ORDER — ONDANSETRON HCL 4 MG/2ML IJ SOLN
INTRAMUSCULAR | Status: DC | PRN
Start: 2016-03-04 — End: 2016-03-04
  Administered 2016-03-04: 4 mg via INTRAVENOUS

## 2016-03-04 MED ORDER — NICARDIPINE IV BOLUS SYRINGE (ANESTHESIA)
INTRAVENOUS | Status: DC | PRN
Start: 2016-03-04 — End: 2016-03-04
  Administered 2016-03-04: .3 mg via INTRAVENOUS

## 2016-03-04 MED ORDER — TOLTERODINE TARTRATE ER 2 MG PO CP24
2.0000 mg | ORAL_CAPSULE | Freq: Every day | ORAL | Status: DC
Start: 2016-03-05 — End: 2016-03-05
  Administered 2016-03-05: 2 mg via ORAL
  Filled 2016-03-04: qty 1

## 2016-03-04 MED ORDER — GLYCOPYRROLATE 0.2 MG/ML IJ SOLN
INTRAMUSCULAR | Status: AC
Start: 2016-03-04 — End: ?
  Filled 2016-03-04: qty 2

## 2016-03-04 MED ORDER — IODIXANOL 320 MG/ML IV SOLN
INTRAVENOUS | Status: DC | PRN
Start: 2016-03-04 — End: 2016-03-04
  Administered 2016-03-04: 142 mL

## 2016-03-04 MED ORDER — SODIUM CHLORIDE 0.9 % IV SOLN
INTRAVENOUS | Status: DC | PRN
Start: 2016-03-04 — End: 2016-03-04
  Administered 2016-03-04 (×2): 1000 mL

## 2016-03-04 MED ORDER — CEFUROXIME SODIUM 1.5 G IJ/IV SOLR (WRAP)
Status: AC
Start: 2016-03-04 — End: ?
  Filled 2016-03-04: qty 1500

## 2016-03-04 MED ORDER — PHENYLEPHRINE HCL 10 MG/ML IV SOLN (WRAP)
Status: DC | PRN
Start: 2016-03-04 — End: 2016-03-04
  Administered 2016-03-04 (×5): 100 ug via INTRAVENOUS

## 2016-03-04 MED ORDER — SODIUM CHLORIDE 0.9 % IV SOLN
INTRAVENOUS | Status: DC
Start: 2016-03-04 — End: 2016-03-05

## 2016-03-04 SURGICAL SUPPLY — 114 items
APPLCATOR CHLORAPREP 26ML (Prep) ×10 IMPLANT
BANDAGE STERI-STRIP 0.5X4IN (Dressing) ×1
BLADE SURGICAL CLIPPER 9660 (Blade) ×1
BLADE SURGICAL CLIPPER PIVOT ADJUSTABLE (Blade) ×1
BLADE SURGICAL CLIPPER PIVOT ADJUSTABLE HEAD 9661 PURPLE (Blade) ×1 IMPLANT
BLANKET UPPER BODY ALLIANCE (Patient Supply) ×2 IMPLANT
BNDG WEBRIL NONSTRL 4IN (Procedure Accessories) ×1
CATH AUROUS CM SIZING (Catheter Micellaneous) ×1
CATH AUROUS CM SIZING (Catheter Miscellaneous) ×1 IMPLANT
CATH BLN Q50 PLUS 10-50MMX65CM (Balloon) ×1 IMPLANT
CATH KUMPE 5F X 65CM (Catheter Micellaneous) ×1
CATH KUMPE 5F X 65CM (Catheter Miscellaneous) ×1 IMPLANT
CATH PIGTAIL 5FR 110CM 6SH (Catheter Micellaneous) ×1
CATH TMPO AQ 4F VER 135 65CM (Catheter Micellaneous) ×1
CATHETER BALLOON OCCLUSION L65 CM Q50 (Balloon) ×1 IMPLANT
CATHETER BALLOON OCCLUSION L65 CM Q50 BALLOON STENT GRAFT (Balloon) ×1 IMPLANT
CATHETER OD4 FR L65 CM RADIOPAQUE BRAID (Catheter Micellaneous) ×1
CATHETER OD4 FR L65 CM RADIOPAQUE BRAID SELECTIVE 135 D VERT CURVE (Catheter Miscellaneous) ×1 IMPLANT
CATHETER OD5 FR L110 CM 6 SIDEHOLE 20 (Catheter Micellaneous) ×1
CATHETER OD5 FR L110 CM 6 SIDEHOLE 20 BAND MARKER PIGTAIL CURVE (Catheter Miscellaneous) ×1 IMPLANT
COVER PROBE SOFT FLEX L96 IN X W6 IN GEL (Procedure Accessories) ×2
COVER PROBE SOFT FLEX L96 IN X W6 IN GEL ULTRASOUND POLYURETHANE (Procedure Accessories) ×2 IMPLANT
DEVICE TORQUE GLIDECATH ACCEPTS .01-.038 (Procedure Accessories) ×1
DEVICE TORQUE GLIDECATH ACCEPTS .01-.038 IN GUIDEWIRE (Procedure Accessories) ×1 IMPLANT
DRAPE INCISE IOBAN-2 90X85CM (Drape) ×2 IMPLANT
DRAPE PROBE SOFT 6X96 (Procedure Accessories) ×2
DRAPE STERI LARGE W/TOWEL (Drape) ×2 IMPLANT
DRESSING TEGADERM 4X4X3/4IN (Dressing) ×2
DRESSING TRANSPARENT L4 3/4 IN X W4 IN (Dressing) ×2
DRESSING TRANSPARENT L4 3/4 IN X W4 IN POLYURETHANE ADHESIVE (Dressing) ×2 IMPLANT
GAUZE SPONGE VERSLN 4PLY 4X4IN (Dressing) ×4 IMPLANT
GEL ULTRASND AQUASNIC 20 GRM (Lab Supplies) ×4 IMPLANT
GLOVE SURG BIOGEL LF SZ8 (Glove) ×4 IMPLANT
GLOVE SURG BIOGEL SZ6.5 (Glove) ×8 IMPLANT
GLOVE SURG BIOGEL SZ7.5 (Glove) ×2 IMPLANT
GLOVE SURG UNDER STRL 6.5 (Glove) ×1
GLOVE SURGICAL 6 1/2 BIOGEL PI INDICATOR (Glove) ×1
GLOVE SURGICAL 6 1/2 BIOGEL PI INDICATOR UNDERGLOVE POWDER FREE SMOOTH (Glove) ×1 IMPLANT
GOWN SMART IMPERVIOUS LARGE (Gown) ×2 IMPLANT
GRAFT ENDOVASCULAR EXCLUDER SINUSOIDAL (Graft) ×1 IMPLANT
GRAFT ENDOVASCULAR EXCLUDER SINUSOIDAL L9.5 CM OD20 MM ID16.5MM (Graft) ×1 IMPLANT
GRAFT ENDOVASCULAR GORE EXCLUDER (Graft) ×2 IMPLANT
GRAFT ENDOVASCULAR GORE EXCLUDER SINUSOIDAL L13.5 CM OD16 MM ID13.5MM (Graft) ×1 IMPLANT
GRAFT ENDOVASCULAR GORE EXCLUDER SINUSOIDAL L9.5 CM OD16 MM ID13.5MM (Graft) ×1 IMPLANT
GRAFT EXCLDR CL 16MMX13.5CM (Graft) ×1 IMPLANT
GRAFT EXCLDR CL 20MMX9.5CM (Graft) ×1 IMPLANT
GRAFT EXCLUDER CL 16MMX9.5CM (Graft) ×1 IMPLANT
INACTIVE USE LAWSON 93583 (Gown) ×4 IMPLANT
INFUSER BAG PRESSURE 500 (Kits) ×2 IMPLANT
INTRODCER PINCLE 5FX10CM (Introducers) ×1
KIT CELL SAVER STARTER (Perfusion Supplies) IMPLANT
KIT CUSTOM PRECISION (Kits) ×2 IMPLANT
KIT CVOR EVAR TAG ~~LOC~~ (Kits) ×2 IMPLANT
KIT PRESSURE MONITOR 3 WAY STOPCOCK (Kits) ×1
KIT PRESSURE MONITOR 3 WAY STOPCOCK TRANSDUCER FLUSH DEVICE MACRODRIP (Kits) ×1 IMPLANT
KIT SYRINGE 150 ML FILL TUBE MEDRAD (Syringes, Needles) ×1
KIT SYRINGE 150 ML FILL TUBE MEDRAD FASTURN MARK V PROVIS (Syringes, Needles) ×1 IMPLANT
KIT TRANSDUCER SINGLE 84" NLTX (Kits) ×1
LINE ANGIO PVC 1000 PSI NAMIC 4.78MM (Tubing) ×1
LINE ANGIOGRAPHY L48 IN LOW FLEXIBILITY EASY VISIBILITY MALE-TO-FEMALE (Tubing) ×1 IMPLANT
MASTISOL VIAL 2/3CC STRL (Skin Closure) ×4 IMPLANT
NEEDLE 18GX7CM SINGLE WALL (Needles) ×2 IMPLANT
PADDING CAST L4 YD X W4 IN COHESION HAND TEARABLE SELF BOND SPECIALIST (Procedure Accessories) ×1 IMPLANT
PADDING CST CTTN SPCLST 100 4YDX4IN LF (Procedure Accessories) ×1
POSITIONER OR CONVOLUTE FOAM HEEL (Patient Supply) IMPLANT
PROTECTOR HEEL FOAM (Patient Supply)
SHEATH DRYSEAL 16F 33CM (Sheath) ×1 IMPLANT
SHEATH INTRODUCER L10 CM L2.5 CM ID5 FR SNAP ON DILATOR LOCK KINK (Introducers) ×1 IMPLANT
SHEATH INTRODUCER L10 CM SNAP ON DILATOR (Introducers) ×1
SHEATH INTRODUCER L33 CM DILATOR VALVE (Sheath) ×1 IMPLANT
SHEATH INTRODUCER L33 CM DILATOR VALVE INFLATION SYRINGE OD6.1 MM ID16 (Sheath) ×1 IMPLANT
SLIPCATH BEACONTIP 5FX38X125 (Catheter Micellaneous) ×1
SLIPCATH BEACONTIP 5FX38X125 (Catheter Miscellaneous) ×1 IMPLANT
SOL NACL.9% 1000ML IRR NONLTX (Irrigation Solutions) ×1
SOL WATER STERILE 1000CC BTLE (Irrigation Solutions) ×1
SOLUTION IRRIGATION 0.9% SODIUM CHLORIDE (Irrigation Solutions) ×1
SOLUTION IRRIGATION 0.9% SODIUM CHLORIDE 1000 ML PLASTIC POUR BOTTLE (Irrigation Solutions) ×1 IMPLANT
SPNG ABSORBABLE GELATIN (Hemostat) IMPLANT
STERILE WATER IRIGATION 1000ML (Irrigation Solutions) ×2 IMPLANT
STOPCOCK IV 700 PSI 3 WAY HIGH PRESSURE (Other) ×1
STOPCOCK IV 700 PSI 3 WAY HIGH PRESSURE MALE ROTATE LOCK OFF HANDLE (Other) ×1 IMPLANT
STOPCOCK IV ARTERIAL EXTN 6IN (Other) ×1
STOPCOCK PSI 700 (Other) ×1
STRIP SKIN CLOSURE L4 IN X W1/2 IN (Dressing) ×1
STRIP SKIN CLOSURE L4 IN X W1/2 IN REINFORCE STERI-STRIP POLYESTER (Dressing) ×1 IMPLANT
SUTURE COATED VICRYL 3-0 SH L27 IN BRAID (Suture) ×1
SUTURE COATED VICRYL 3-0 SH L27 IN BRAID COATED VIOLET ABSORBABLE (Suture) ×1 IMPLANT
SUTURE MONOCRYL 4-0 PS2 27IN (Suture) ×2 IMPLANT
SUTURE PROLENE 5-0 RB2 30IN (Suture) IMPLANT
SUTURE PROLENE 6-0 RB2 30IN (Suture) IMPLANT
SUTURE SILK 2-0 24IN (Suture) ×1
SUTURE SILK 3-0 18IN (Suture) ×1
SUTURE SILK PERMA HAND BLACK 2-0 L24 IN (Suture) ×1
SUTURE SILK PERMA HAND BLACK 2-0 L24 IN BRAID 2 STRAND PRECUT (Suture) ×1 IMPLANT
SUTURE SILK PERMA HAND BLACK 3-0 L18 IN (Suture) ×1
SUTURE SILK PERMA HAND BLACK 3-0 L18 IN BRAID TIES 12 STRAND PRECUT (Suture) ×1 IMPLANT
SUTURE VICRYL 2-0 CT1 36IN (Suture) IMPLANT
SUTURE VICRYL 3-0 SH 27IN (Suture) ×1
SYRINGE ANGIO PROVIS 150ML (Syringes, Needles) ×1
TORQUE DEVICE MULTI LINGUAL (Procedure Accessories) ×1
TOWEL L26 IN X W17 IN COTTON PREWASH DELINT BLUE ACTISORB DELUXE (Procedure Accessories) ×3 IMPLANT
TOWEL OR STRL DLUX BLUE 10PK (Procedure Accessories) ×3
TOWEL SRG CTTN 26X17IN LF STRL PREWASH (Procedure Accessories) ×3
TRAY FOLEY URINE METER IC16 F (Catheter Micellaneous) ×1
TRAY FOLEY URINE METER IC16 F (Catheter Miscellaneous) ×1 IMPLANT
TRAY SSTEP CATH UMETER 16FR (Tray) ×2 IMPLANT
TUBG INJCTR HI PRESS 48IN STER (Tubing) ×1
TUBING ATS SUCTION LINE (Tubing) IMPLANT
TUBING PRSS MNTR PVC 6IN LF MNTR LN 4W (Other) ×1 IMPLANT
UNDRGLOV SZ 7 PI INDICATR BLUE (Glove) ×4 IMPLANT
UNDRGLOV SZ7.5 PI INDICATR BLU (Glove) ×2 IMPLANT
VISIPAQUE 320MG/100ML V562 (Procedure Accessories) ×6 IMPLANT
WATER STERILE PLASTIC POUR BOTTLE 1000 (Irrigation Solutions) ×1
WATER STERILE PLASTIC POUR BOTTLE 1000 ML (Irrigation Solutions) ×1 IMPLANT

## 2016-03-04 NOTE — H&P (Signed)
HISTORY AND PHYSICAL EXAM  Vascular Surgery      Date Time: 03/04/16 9:09 AM  Patient Name: Brent [name],Brent [name]  Attending Physician: Dr. Zollie Pee     History of Present Illness:   Brent [name] is a 75 y.o. male who presented to an OSH ED after experiencing substernal chest and back tightness this afternoon after playing golf.  This radiated to his jaw.  Workup demonstrated that the patient was in Afib, which is apparently a new diagnosis.  CTA obtained to evaluate for aortic dissection revealed a saccular infrarenal aortic aneurysm, which the patient reports was noted on outside imaging around 2013.  He was told at that time that it would be monitored.  The patient denies abdominal pain, [name]/c, n/v, diarrhea, constipation, and other complaints.     The patient does admit to starting colchicine for gout in his left foot yesterday.      Problem List:     Patient Active Problem List   Diagnosis   . Dyspnea on exertion   . HTN (hypertension)   . Hyperlipidemia   . On home oxygen therapy   . History of pulmonary embolism   . GERD (gastroesophageal reflux disease)   . Anemia due to bone marrow failure   . Thrombocytopenia   . Neutropenia   . Atrial fibrillation       Past Medical History:     Past Medical History:   Diagnosis Date   . Anemia     [facility] 06-25-2015; CBC abnl    . Arrhythmia 12/27/2015    AFib - new onset- taking Eliquis   . Arthritis     right knee   . 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 Chronic gout     no meds; no symptoms   . Disorder of prostate     BPH    . GERD (gastroesophageal reflux disease)    . Hard to intubate     experienced once after radiation for throat cancer in 2003; subsequent intubations have presented no problems   . Hiatal hernia    . Hyperlipidemia    . Hypertensive disorder     110/60 - 120/70 at recent MD appointments   . Interstitial lung disease 2013    Oxygen Sat is at 94-95 typically; does not impede activity; no longer uses home O2   .  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 Pulmonary embolus 2013       Past Surgical History:     Past Surgical History:   Procedure Laterality Date   . BONE MARROW BIOPSY  03/2015    FOLLOWED BY DR Be[facility] HEMATOLOGIST   . EGD  2012    X 2   . EGD N/A 08/23/2015    Procedure: EGD;  Surgeon: Trixie Dredge, MD;  [facility];  Service: Gastroenterology;  Laterality: N/A;   . FRACTURE SURGERY  1988    RT TIB/FIB CLOSED REDUCTION   . HERNIA REPAIR Right 2014    INGUINAL    . JOINT REPLACEMENT Left 03/2009    Left TKR,    . RT KNEE ACL  1988   . THORACOSCOPY, (VATS) Right 05/2011    RUL, RLL   . TONSILLECTOMY  1946       Family History:     Family History   Problem Relation Age  of Onset   . Aplastic anemia Mother    . Hypertension Father        Social History:     Social History     Social History   . Marital status: Married     Spouse name: N/A   . Number of children: N/A   . Years of education: N/A     Social History Main Topics   . Smoking status: Former Smoker     Packs/day: 1.50     Years: 20.00     Quit date: 04/17/1982   . Smokeless tobacco: Never Used      Comment: CIGAR 5-6 TIMES PER YEAR   . Alcohol use 1.2 - 3.6 oz/week     2 - 6 Shots of liquor per week   . Drug use: No   . Sexual activity: Not on file     Other Topics Concern   . Exposure To Radiation? Yes     Radiation Treatment in 2003   . Exposure To Other Chemicals? No   . Exposure To Asbestos? No     Social History Narrative   . No narrative on file       Allergies:   No Known Allergies    Medications:     Prior to Admission medications    Medication Sig Start Date End Date Taking? Authorizing Provider   apixaban (ELIQUIS) 5 MG Take 5 mg by mouth every 12 (twelve) hours.   Yes [provider]   atorvastatin (LIPITOR) 40 MG tablet Take 20 mg by mouth every morning.      Yes [provider]   Coenzyme Q10 (CO Q 10 PO) Take 1 capsule by mouth daily.   Yes [provider]    lisinopril (PRINIVIL,ZESTRIL) 10 MG tablet Take 20 mg by mouth every morning.      Yes [provider]   Multiple Vitamin (MULTIVITAMIN) tablet Take 1 tablet by mouth daily.   Yes [provider]   pantoprazole (PROTONIX) 40 MG tablet Take 40 mg by mouth every morning.      Yes [provider]   tamsulosin (FLOMAX) 0.4 MG Cap Take 0.4 mg by mouth daily. 09/03/15  Yes [provider]   tolterodine (DETROL LA) 2 MG 24 hr capsule Take 2 mg by mouth daily. 12/17/15  Yes [provider]       Review of Systems:   +redness and pain to left foot    Physical Exam:   There were no vitals filed for this visit.    Body mass index is 32.69 kg/m.    Intake and Output Summary (Last 24 hours) at Date Time  No intake or output data in the 24 hours ending 03/04/16 0909      Physical Exam:   Gen: Alert, oriented, normal affect   HEENT: Normocephalic, EOMI   Card: Regular rate   Pulm: Normal effort, no wheeze, symmetric chest rise   Abd: soft, nontender, nondistended, well healed right inguinal surgical scar. No palpable or pulsatile masses.   Neuro: No gross motor or sensory loss noted    Lower Extremity:    Derm: Left foot with erythema dorsally.    Neuro: Gross sensation intact   MSK: No gross deformity present. Pain with light palpation of left foot.    Vasc: see below    Palpable radial, femoral, popliteal, PT, and DP pulses bilaterally.    Assessment:   Brent [name] is a 75 y.o.  male with PMH of Afib who presents with known saccular AAA and is here today for EVAR.      All alternatives, risks and complications discussed with patient and all questions and concerns addressed.  All questions and concerns addressed and patient exhibited appropriate understanding of all discussion points. Informed consent obtained with no guarantees to surgical outcome given or implied.    Plan:   - Proceed with surgery as planned        Traci Sermon, DPM PGY-1  [facility]    Plan EVAR  possible Open AAA repair of saccular aortic aneuryms to prevent rupture, bleeding, and preserve life.  The indications, benefits and risks have been explained to the patient in the prsence of the nurse/anesthesiologist/ and wife. Risks including but not limited to bleeding, infection, groin complications, embolization, vessel perforation, ischemia, retained foreign bodies, anaphylaxis, renal failure, need for adjunctive procedures, nerve damage, damage to adjacent structures, need for open surgical conversion, endoleak, lack of resolution of signs and symptoms, inability to complete the procedure, limb loss, stroke, renal failure, heart attack, multisystem organ failure, limb paralysis, colonic or gut infarction, and death were explained to the patient.  The patient demonstrated understanding, and acceptance of the risks, and agreed to proceed forth with the procedure.  I answered all of the patient's questions to the patient's satisfaction.    Mena Simonis Lutricia Horsfall, MD, FACS, RPVI  Vascular and Endovascular Surgery   [facility]   Director - [facility]  Director - [facility]   Pager:  [phone]  Cell:  [phone]  Tel:  [phone]

## 2016-03-04 NOTE — Anesthesia Preprocedure Evaluation (Signed)
Anesthesia Evaluation    AIRWAY    Mallampati: III    TM distance: >3 FB  Neck ROM: limited  Mouth Opening:limited   CARDIOVASCULAR    cardiovascular exam normal, regular and normal       DENTAL    no notable dental hx     PULMONARY    pulmonary exam normal and clear to auscultation     OTHER FINDINGS              Relevant Problems   (+) Atrial fibrillation   (+) Dyspnea on exertion   (+) GERD (gastroesophageal reflux disease)   (+) HTN (hypertension)               Anesthesia Plan    ASA 3     general                     intravenous induction   Detailed anesthesia plan: general LMA        Post op pain management: per surgeon    informed consent obtained    Plan discussed with CRNA.    ECG reviewed  pertinent labs reviewed  imaging results reviewed           Signed by: Tito Dine 03/04/16 9:39 AM

## 2016-03-04 NOTE — Plan of Care (Addendum)
Problem: Safety  Goal: Patient will be free from injury during hospitalization  Outcome: Progressing      Problem: Pain  Goal: Pain at adequate level as identified by patient  Outcome: Progressing      Problem: Side Effects from Pain Analgesia  Goal: Patient will experience minimal side effects of analgesic therapy  Outcome: Progressing      Problem: Discharge Barriers  Goal: Patient will be discharged home or other facility with appropriate resources  Outcome: Progressing      Problem: Psychosocial and Spiritual Needs  Goal: Demonstrates ability to cope with hospitalization/illness  Outcome: Progressing      Problem: Moderate/High Fall Risk Score >5  Goal: Patient will remain free of falls  Outcome: Progressing      Comments: S/p Endostent, Abdominal Aortic Aneurysm (Evar), bilateral groin sites, no signs of bleeding or hematoma noted. Will continue to monitor per protocol.  Pt c/o pain on sites, administered prn IV morphine. Foley catheter in place.   Oriented to room, tele monitor ordered, fall mats in place and call bell within reach.

## 2016-03-04 NOTE — Op Note (Signed)
Procedure Date: 03/04/2016     Patient Type: I     SURGEON: Kamaree Berkel A Shelise Maron MD  ASSISTANT:       PREOPERATIVE DIAGNOSIS:  Saccular infrarenal abdominal aortic aneurysm x2.     POSTOPERATIVE DIAGNOSES:  Saccular infrarenal abdominal aortic aneurysm x2.     TITLE OF PROCEDURE:  Bilateral femoral percutaneous ultrasound-guided ProGlide guided access  catheter in the aorta, marker pigtail catheter aortogram and pelvic  angiogram, endovascular abdominal aortic aneurysm repair with Gore modular  bifurcated endograft deployed from the right femoral approach, main body 23  mm x 14.5 mm x 120 mm, left contralateral iliac limb 16 mm x 135 mm, right  iliac extension limb, 20 mm x 95 mm, aortoiliac angioplasty, radiologic  supervision and interpretation, bilateral femoral ProGlide closure.     COMPLICATIONS:  None.     CONDITION:  Stable.     ANESTHESIA:  General via endotracheal tube.     ESTIMATED BLOOD LOSS:  Negligible.     CONTRAST:    140 mL.     INDICATIONS:  [name] is a 75 year old white male with 2 infrarenal saccular  abdominal aortic aneurysms.  Due to saccular morphology, he required  endovascular aneurysm repair to prevent aneurysm rupture, bleeding and  preserve life.  The aforementioned indication was explained to the patient  and the risks including but not limited to bleeding, infection, nerve  damage, heart attack, stroke, lack of signs and symptoms of resolution,  retention of foreign bodies, need for additional procedures, limb loss,  renal failure, groin puncture complications, colonic infarction, gut  infarction and death.  He understood and agreed to proceed forth with the  surgery.     DESCRIPTION OF PROCEDURE:  After informed consent was obtained, the patient was taken to the operating  room, placed in supine position.  General anesthesia via endotracheal tube  was established.  Intravenous antibiotics were administered.  Arterial line  was placed by anesthesia.  The patient was shaved,  prepped and draped in  standard sterile fashion from nipples to knees after Foley catheter was  placed by nursing.  The appropriate timeout was instituted and then  bilateral femoral ultrasound-guided access was performed to bilateral  common femoral arteries utilizing the SonoSite.  Bilateral common femoral  arteries were retrograde percutaneously accessed with micropuncture needles  and wires.  We made sure enter the common femoral arteries in an area that  was relatively devoid of calcium anteriorly.  The needles were removed.   The skin was nicked with 11 blades.  The tracts were dilated with hemostats  and then micropuncture dilators were placed bilaterally.  The obturators  and wires were removed and then Bentson wires advanced to the descending  thoracic aorta bilaterally.  The sheaths were removed.  The tracts were  sounded with 6-mm sheaths bilaterally and then at 10 o'clock and 2 o'clock  ProGlide devices were deployed bilaterally.  After this, both femoral  arteries were plugged with 11-French sheaths.  A pigtail catheter marker  was placed via the right femoral approach and a marker pigtail catheter  aortogram was then performed.  The distance from the renal arteries to the  hypogastrics was measured.  The distance from the renal arteries to the  aortic bifurcation was measured.  The distance from the renal arteries to  where the gate was opened was measured as well.  Finally it was then  observed that the patient had an accessory left renal artery in addition  to, of course, the left main and right main renal arteries.  We intended to  spare the accessory left renal artery.  Next after this now, the sheath was  removed from both groins and a 16-French sheath was advanced to just below  the renal arteries on the right and a 12-French sheath to the terminal  aorta on the left.  The dilator was removed and from the left femoral  approach, a repeat aortogram in an oblique view was performed marking  the  accessory left, main left and main right renal arteries on the [facility] monitor  screen and the aortic bifurcation and after this, from the right femoral  approach now, the main body device was deployed 23 mm x 14.5 x 120 mm such  that the main body would open, the contralateral gate would open and the  ipsilateral limb was left in a constrained configuration.  The gate was  opened in a "crossed limbs configuration" with the gate being slightly  anteriorly oriented. It was found that there was still some compression  noted or constraint if you will on the gate and therefore, the device was  re-constrained, rotated and advanced slightly cephalad in order to open the  gate such that the gate would open in the section, but opposite the wall of  the pseudoaneurysmal degeneration of the aorta.  Next, a repeat aortogram  was performed indicating that the graft still remained below the renal  arteries, and it was unconstrained once again and then after this, the  pigtail was straightened over a wire and then from the left femoral  approach, a Kumpe catheter was placed.  The wire was pulled back.  The gate  was then cannulated with the angled Glidewire.  The catheter was removed  and a pigtail catheter was advanced over a wire and it spun nicely in the  main body of the device.  It was confirmed that the gate had truly been  cannulated.  After this now, a stiff wire was placed through the pigtail  catheter to the descending thoracic aorta.  The wire was marked at its base  as had been the previous wire once that Bentson wire had been exchanged for  a Super Stiff Lunderquist wire before the main body device had been  deployed.  Now from the left femoral approach, a retrograde sheathogram was  then performed and the distance from the flow divider of the stent graft  down to the left hypogastric artery was measured and based upon this now, a  16 mm x 16 mm x 135 mm left iliac docking limb was then deployed to spare  the left  hypogastric artery.  Now, the right ipsilateral limb was fully  unconstrained, the delivery system was removed and then a retrograde  sheathogram was performed in LAO projection and the distance from the  hypogastric artery to halfway up along the right limb of the graft was  measured and a 16-mm to 20-mm flared by 95 mm right iliac extension limb  was then deployed.  Aortoiliac balloon angioplasty was now performed of the  proximal and distal fixation sites of the stent graft and from the right  femoral approach, a pigtail catheter was placed and completion aortogram  was performed by drawing back on the sheaths of bilateral external iliac  artery sheaths.  This indicated patency of the accessory renal on the left,  the main renal on the left, the main renal on the right and both  hypogastric arteries.  Next, after this, the delivery system was removed  from the right femoral approach after a dilator had been placed back into  the sheath and then a ProGlide closure was performed of the right femoral  arteriotomy at the 10 o'clock and 2 o'clock positions.  The closure was  performed over the Bentson wire and then the Bentson wire was removed just  before the knot suture was pushed down.  A similar procedure was repeated  for the left groin and 15 minutes of manual pressure was held in both  groins.  No complications ensued.  No bleeding was noted.  The skin was  reapproximated using Indermil.  The patient had palpable DP and PT pulse at  the cases' end.  He emerged from anesthesia uneventfully and was alert and  oriented x3, hemodynamically stable with palpable pedal pulses.  The  sponge, needle, instrument, sheath, catheter, balloon, wire, stent count  were correct at the end of the case.  Please note that I as primary  operative surgeon was present for the case in its entirety including its  critical portions.           D:  03/04/2016 13:36 PM by Dr. Benita Gutter, MD ([id number])  T:  03/04/2016 19:08 PM by  [id number]      Everlean Cherry: [id number]) (Doc ID: [id number])

## 2016-03-04 NOTE — Brief Op Note (Signed)
BRIEF OP NOTE    Patient Name:   Brent [name]    Date of Operation:   03/04/2016    Surgeon:   Primary: Benita Gutter, MD  Assistant: Traci Sermon, DPM PGY-1    Pre-Operative Diagnosis:   sacular infrarenal AAA    Post-Operative Diagnosis:   sacular infrarenal AAA    Procedure:   Procedure(s) (LRB):  Endostent, Abdominal Aortic Aneurysm (Evar) (N/A)    Anesthesia:    General  Anesthesiologist: Tito Dine, MD  CRNA: Wandra Arthurs, CRNA    Hemostasis:   * No tourniquets in log *  Estimated Blood Loss:   250 cc    Materials:   n/a    Implants:     Implant Name Type Inv. Item Serial No. Manufacturer Lot No. LRB No. Used Action   gore excluder trunk-ipsilateral leg   [id number]   N/A 1 Implanted   GRAFT EXCLDR CL [id number] - [id number] Graft GRAFT EXCLDR CL 20MMX9.5CM [id number] [employer]  Left 1 Implanted   GRAFT EXCLDR CL 16MMX13.5CM - [id number] Graft GRAFT EXCLDR CL [id number] [employer]  Left 1 Implanted       Injectables:     Specimens:   None    Antibiotics:     Drains:   None    Complications:   None    Findings:   Saccular abdominal aortic aneurysm. Placed main body stent and 2 iliac stent limbs.     See op note for detailed report.    Condition:   Stable                                Traci Sermon, DPM  [facility] PGY-1

## 2016-03-04 NOTE — OR Nursing (Addendum)
1610 - Patient and family seen in pre-op holding area.  Patient verifies, name, date of birth, and surgical procedure being performed today.  H&P has been reviewed and all consents have been signed and are present in patient's chart.  Family acknowledges teachings with no further questions at this time.     1241 - Report given to  Fransico Michael, PACU RN.    1310 - Good pulses on bilateral feet using a doppler postop.

## 2016-03-04 NOTE — Progress Notes (Addendum)
Surgery Post-Op Check Note  03/04/2016      Subjective   Brent [name] was visited at bedside this evening found to be resting comfortably in bed.  He denies any complications or significant pain following EVAR procedure today.  He denies any dizziness, chest pain, abdominal pain, shortness of breath, nausea, vomiting.  He denies any groin tenderness.     Objective   BP 129/70   Pulse (!) 55   Temp 97.8 F (36.6 C)   Resp 18   Ht 1.727 m (5\' 8" )   Wt 97.5 kg (215 lb)   SpO2 96%   BMI 32.69 kg/m     PHYSICAL EXAM:  General: NAD  HEENT: normocephalic  Chest:  Normal respiratory effort, CTAB  Heart:  RRR  Abdomen:  Soft, non-distended, nontender  Groin: Bilateral groin sites are soft, no hematomas noted.   Extremities:  MAE, Palpable DP, PT pulses bilaterally, foot warm b/l  Neuro: grossly normal     Assessment/Plan   Brent [name] is a 75 y.o. male who is POD#0 s/p EVAR. Doing well post-operatively.   -- Continue current pain control regimen  -- Cardiac diet  -- DVT prophylaxis: bilateral SCDs  -- Foley to stay in tonight. Will remove tomorrow morning.   -- Anti-coagulation: Will restart home Eliquis tomorrow morning.   --Activity: Out of bed ambulating tomorrow morning. Bedrest tonight.         Traci Sermon, DPM PGY-1  [facility]

## 2016-03-04 NOTE — Transfer of Care (Signed)
Anesthesia Transfer of Care Note    Patient: BO TEICHER    Procedures performed: Procedure(s):  Endostent, Abdominal Aortic Aneurysm (Evar)    Anesthesia type: General ETT    Patient location:PACU    Last vitals:   Vitals:    03/04/16 1329   BP: 140/74   Pulse: (!) 54   Resp: 16   Temp: 36.9 C (98.4 F)   SpO2: 100%       Post pain: Patient not complaining of pain, continue current therapy      Mental Status:sedated    Respiratory Function: tolerating nasal cannula    Cardiovascular: stable    Nausea/Vomiting: patient not complaining of nausea or vomiting    Hydration Status: adequate    Post assessment: no apparent anesthetic complications, no reportable events and no evidence of recall    Signed by: Wandra Arthurs  03/04/16 1:30 PM

## 2016-03-04 NOTE — Plan of Care (Cosign Needed Addendum)
Skin assessment:    Bilateral incision sites, OTA, c/d/i, no signs of bleeding or hematoma noted. Right shin has scabs, no other skin breakdown noted.

## 2016-03-05 ENCOUNTER — Encounter: Payer: Self-pay | Admitting: Specialist

## 2016-03-05 MED ORDER — COLCHICINE 0.6 MG PO TABS
0.6000 mg | ORAL_TABLET | Freq: Once | ORAL | Status: AC
Start: 2016-03-05 — End: 2016-03-05
  Administered 2016-03-05: 0.6 mg via ORAL
  Filled 2016-03-05: qty 1

## 2016-03-05 MED ORDER — APIXABAN 5 MG PO TABS
5.0000 mg | ORAL_TABLET | Freq: Two times a day (BID) | ORAL | Status: DC
Start: 2016-03-05 — End: 2016-03-05
  Administered 2016-03-05: 5 mg via ORAL
  Filled 2016-03-05: qty 1

## 2016-03-05 MED ORDER — ACETAMINOPHEN 500 MG PO TABS
1000.0000 mg | ORAL_TABLET | Freq: Three times a day (TID) | ORAL | Status: DC
Start: 2016-03-05 — End: 2017-07-09

## 2016-03-05 NOTE — Plan of Care (Addendum)
Pt is A&Ox4, VSS on RA, SB on tele with HR in the 50s. Pt has morphine q4hrs for pain. Given to patient x1. Pt has NS running at 168mL/hr. Bilateral groin site CDI and OTA.    Moderate Fall: bed is low, mats down, call bell within reach. Pt is on bedrest until the morning.    Plan: Bedrest until morning, remove foley in the morning, manage pain. Will continue to monitor.

## 2016-03-05 NOTE — UM Notes (Signed)
03/04/16 1331  Admit to Inpatient Once         75 y.o. with PMH of afib who presented with saccular AAA     Date of Operation:   03/04/2016    Pre-Operative Diagnosis:   sacular infrarenal AAA    Post-Operative Diagnosis:   sacular infrarenal AAA    Procedure:   Procedure(s) (LRB):  Endostent, Abdominal Aortic Aneurysm (Evar) (N/A)    Drains:   None    Complications:   None    Findings:   Saccular abdominal aortic aneurysm. Placed main body stent and 2 iliac stent limbs.     See op note for detailed report.    INPT CSR 03/05/16    1 day s/p EVAR doing well post-operatively.    S/p EVAR, bilateral groins sites are c/d/i, no signs of bleeding or hematoma noted     Foley catheter removed, pt is due to void by 1355. - Pt voided at 0935    BP 118/59   Pulse 61   Temp 97.9 F (36.6 C) (Oral)   Resp 18   Ht 1.727 m (5\' 8" )   Wt 97.5 kg (215 lb)   SpO2 98%   BMI 32.69 kg/m     Scheduled Meds:  Current Facility-Administered Medications   Medication Dose Route Frequency   . acetaminophen  1,000 mg Oral Q8H   . apixaban  5 mg Oral Q12H SCH   . atorvastatin  20 mg Oral Daily   . colchicine  0.6 mg Oral Once   . lisinopril  20 mg Oral Daily at 1800   . pantoprazole  40 mg Oral QAM AC   . tamsulosin  0.4 mg Oral QD after dinner   . tolterodine  2 mg Oral Daily     Continuous Infusions:  . sodium chloride 125 mL/hr at 03/05/16 0507     PRN Meds:.morphine     Plan:   -Encourage ambulation and incentive spirometry  -Continue pain control   -Continue vascular checks  -Foley catheter removed this AM  -Restarted home Eliquis this AM  -Likely discharged home later today    Derrel Nip, RN, BSN  ACM  Utilization Review Case Manager   [employer]  [phone]

## 2016-03-05 NOTE — Plan of Care (Signed)
Problem: Safety  Goal: Patient will be free from injury during hospitalization  Outcome: Progressing      Problem: Pain  Goal: Pain at adequate level as identified by patient  Outcome: Progressing      Problem: Side Effects from Pain Analgesia  Goal: Patient will experience minimal side effects of analgesic therapy  Outcome: Progressing      Problem: Discharge Barriers  Goal: Patient will be discharged home or other facility with appropriate resources  Outcome: Progressing      Problem: Psychosocial and Spiritual Needs  Goal: Demonstrates ability to cope with hospitalization/illness  Outcome: Progressing      Problem: Moderate/High Fall Risk Score >5  Goal: Patient will remain free of falls  Outcome: Progressing      Comments: S/p EVAR, bilateral groins sites are c/d/i, no signs of bleeding or hematoma noted. Pt voided 300 ml at 0935. Will continue to monitor per protocol.

## 2016-03-05 NOTE — Final Progress Note (DC Note for stay less than 48 (Signed)
FINAL PROGRESS NOTE  Vascular Surgery    Date Time: 03/05/16 7:16 AM  Patient Name: Brent [name]  Attending Vascular Physician: Dr. Zollie Pee  Team Contact Info: Spectra: [phone]  Hospital Day: Hospital Day: 2    Assessment:     Patient Active Problem List   Diagnosis   . Dyspnea on exertion   . HTN (hypertension)   . Hyperlipidemia   . On home oxygen therapy   . History of pulmonary embolism   . GERD (gastroesophageal reflux disease)   . Anemia due to bone marrow failure   . Thrombocytopenia   . Neutropenia   . Atrial fibrillation   . AAA (abdominal aortic aneurysm)        MACKSON BOTZ is a 75 y.o. with PMH of afib who presented with saccular AAA now 1 day s/p EVAR doing well post-operatively.     Plan:   -Encourage ambulation and incentive spirometry  -Continue pain control   -Continue vascular checks  -Foley catheter removed this AM  -Restarted home Eliquis this AM  -Likely discharged home later today    Subjective:   HPI: Brent [name] a 75 y.o.malewho presented to an OSH ED after experiencing substernal chest and back tightness this afternoon after playing golf. This radiated to his jaw. Workup demonstrated that the patient was in Afib, which is apparently a new diagnosis. CTA obtained to evaluate for aortic dissection revealed a saccular infrarenal aortic aneurysm, which the patient reports was noted on outside imaging around 2013. He was told at that time that it would be monitored. The patient denies abdominal pain, f/c, n/v, diarrhea, constipation, and other complaints.   The patient does admit to starting colchicine for gout in his left foot yesterday.        Procedures:  03/04/16: EVAR    Interval History: No acute events overnight,  Minimal pain.  +flatus, states gout pain in left foot is decreased from yesterday    Review of Symptoms:   As per HPI, stated above    Labs:     Recent Labs  Lab 03/04/16  1433 02/28/16  1730   WBC 2.23* 2.65*   RBC 2.93* 3.30*   Hgb 10.1*  11.3*   Hematocrit 30.3* 35.0*   Glucose 113* 87   BUN 19.0 23.0   Creatinine 1.0 1.1   Calcium 8.3 9.2   Sodium 138 141   Potassium 4.1 4.3   Chloride 107 104   CO2 24 27       Input/Output:   Intake and Output Summary (Last 24 hours) at Date Time    Intake/Output Summary (Last 24 hours) at 03/05/16 0716  Last data filed at 03/05/16 0646   Gross per 24 hour   Intake           3829.5 ml   Output             1810 ml   Net           2019.5 ml       Physical Exam:     Vitals:    03/05/16 0317   BP: 119/70   Pulse: (!) 57   Resp: 18   Temp: 97.8 F (36.6 C)   SpO2: 94%     Temp (24hrs), Avg:98.2 F (36.8 C), Min:96.9 F (36.1 C), Max:98.8 F (37.1 C)      Gen: NAD  Cardio: RRR.   Pulm: unlabored breathing.   Abd: soft, ND, NTTP, groin sites  soft, no hematoma  Ext: warm, sensation intact, motors intact bilaterally , left foot with mild erythema dorsal forefoot/midfoot    Pulse:       Lower Ext:  Right Left   Femoral: Palpable pulses:  2+ Femoral: Palpable pulses:  2+   Pop: Palpable pulses:  2+ Pop: Palpable pulses:  2+   DP: Palpable pulses:  2+ DP: Palpable pulses:  2+   PT: Palpable pulses:  2+ PT: Palpable pulses:  2+     Meds:     Current Facility-Administered Medications   Medication Dose Route Frequency   . acetaminophen  1,000 mg Oral Q8H   . apixaban  5 mg Oral Q12H SCH   . atorvastatin  20 mg Oral Daily   . lisinopril  20 mg Oral Daily at 1800   . pantoprazole  40 mg Oral QAM AC   . tamsulosin  0.4 mg Oral QD after dinner   . tolterodine  2 mg Oral Daily       Radiology:     Radiology Results (24 Hour)     Procedure Component Value Units Date/Time    FL Fluoro > 1 Hour [[id number]] Collected:  03/04/16 1418    Order Status:  Completed Updated:  03/04/16 1422    Narrative:       History: Imaging posterior to.    Fluoroscopic guidance was provided for this operative procedure,  performed without the presence of a radiologist. Fluoroscopy time 1150  seconds. 10 images were obtained.      Impression:         Fluoroscopy provided.         Clide Cliff, MD   03/04/2016 2:18 PM            Traci Sermon, DPM PGY-1  [facility]    Doing well POD#1 s/p PEVAR.  D/C home today.    Daley Mooradian Lutricia Horsfall, MD, FACS, RPVI  Vascular and Endovascular Surgery   [facility]   Director - [facility]  Director - [facility] Vascular Quality Initiative   Pager:  [phone]  Cell:  [phone]  Tel:  [phone]

## 2016-03-05 NOTE — Anesthesia Postprocedure Evaluation (Signed)
Anesthesia Post Evaluation    Patient: Brent [name]    Procedure(s):  Endostent, Abdominal Aortic Aneurysm (Evar)    Anesthesia type: General ETT    Last Vitals:   Vitals:    03/05/16 1546   BP: 131/60   Pulse: 62   Resp: 19   Temp: 36.6 C (97.8 F)   SpO2: 97%       Patient Location: Med Surgical Floor      Post Pain: Patient not complaining of pain, continue current therapy    Mental Status: awake and alert    Respiratory Function: tolerating nasal cannula    Cardiovascular: stable    Nausea/Vomiting: patient not complaining of nausea or vomiting    Hydration Status: adequate    Post Assessment: no apparent anesthetic complications, no reportable events and no evidence of recall          Anesthesia Qualified Clinical Data Registry    Central Line      CVC insertion : NO                                               Perioperative temperature management      General/neuraxial anesthesia > or = 60 minutes (excluding CABG) : YES              > Use of intraoperative active warming : YES              > Temperature > or = 36 degrees Centigrade (96.8 degrees Farenheit) during time span from 30 minutes before up to 15 minutes after anesthesia end time : YES      Administration of antibiotic prophylaxis      Age > or = 18, with IV access, with surgical procedure for which antibiotic prophylaxis indicated, and not on chronic antibiotics : YES              > Prophylactic antibiotics within 1 hour of incision (or fluroroquinolone/vancomycin within 2 hours of incision) : YES    Medication Administration      Ordering or administration of drug inconsistent with intended drug, dose, delivery or timing : NO      Dental loss/damage      Dental injury with administration of anesthesia : NO      Difficult intubation due to unrecognized difficult airway        Elective airway procedure including but not limited to: tracheostomy, fiberoptic bronchoscopy, rigid bronchoscopy; jet ventilation; or elective use of a device to  facilitate airway management such as a Glidescope : NO                > Unanticipated difficult intubation post pre-evaluation : NO      Aspiration of gastric contents        Aspiration of gastric contents : NO                    Surgical fire        Procedure requiring electrocautery/laser : NO                    Immediate perioperative cardiac arrest        Cardiac arrest in OR or PACU : NO                    Unplanned hospital admission  Unplanned hospital admission for initially intended outpatient anesthesia service : NO      Unplanned ICU admission        Unplanned ICU admission related to anesthesia occurring within 24 hours of induction or start of MAC : NO      Surgical case cancellation        Cancellation of procedure after care already started by anesthesia care team : NO      Post-anesthesia transfer of care checklist/protocol to PACU        Transfer from OR to PACU upon case conclusion : NO                    Post-anesthesia transfer of care checklist/protocol to ICU        Transfer from OR to ICU upon case conclusion : YES                    Post-operative nausea/vomiting risk protocol        Post-operative nausea/vomiting risk protocol : YES  Patient > or = 18 with care initiated by anesthesia team that has a risk factor screen for post-op nausea/vomiting (Includes male, hx PONV, or motion sickness, non-smoker, intended opioid administration for post-op analgesia.)    Anaphylaxis        Anaphylaxis during anesthesia services : NO    (Inclusive of any suspected transfusion reaction in association with blood-bank confirmed blood product incompatibility)              Signed by: Cordelia Poche, 03/05/2016 5:29 PM

## 2016-03-05 NOTE — Plan of Care (Addendum)
Foley catheter removed, pt is due to void by 1355. - Pt voided at 0935

## 2016-03-05 NOTE — Discharge Instr - AVS First Page (Signed)
Reason for your Hospital Admission:  Saccular abdominal aortic aneurysm       Instructions for after your discharge:    1.  Call Dr. Denyse Amass office for 2-3 week f/u appt.  Call [phone].  2.  If severe abdominal or back pain, groin swelling or bleeding, or leg turns cold, blue, or mottled, call Dr. Denyse Amass office.  3.   Tylenol every 4-6 hrs as needed for pain.  4.  No exercise, sex, lifting > 20# x 2 weeks.  5.  May drive after 2 days.    6.  May remove dressing after 48hrs, shower, get wound wet, dry, and leave open to air.  7.  Resume all home meds.  8.  May return to work after 2 weeks.

## 2016-03-05 NOTE — Progress Notes (Signed)
03/05/16 1100   Discharge Disposition   Patient preference/choice provided? N/A   Physical Discharge Disposition Home, No Needs   Mode of Transportation Car   Pick up time spouse in rm to [place] pt home    Patient/Family/POA notified of transfer plan Yes   Patient agreeable to discharge plan/expected d/c date? Yes   Family/POA agreeable to discharge plan/expected d/c date? Yes   Bedside nurse notified of transport plan? Yes   Special requirements for patient during transport: (n/a)   Outpatient Services   Home Health (n/a)   Services (n/a)   CM Interventions   Follow up appointment scheduled? No   Reason no follow up scheduled? Family to schedule   Referral made for home health RN visit? Does not meet home bound criteria   Multidisciplinary rounds/family meeting before d/c? Yes   Medicare Checklist   Is this a Medicare patient? Yes   Patient received 1st IMM Letter? Yes   3 midnight inpatient qualifying stay (SNF only) No   If LOS 3 days or greater, did patient received 2nd IMM Letter? n/a

## 2016-03-05 NOTE — Progress Notes (Signed)
Discharge instructions given, all questions been addressed. Discontinued IVs and tele monitor.

## 2016-03-10 ENCOUNTER — Telehealth: Payer: Self-pay

## 2016-03-10 NOTE — Telephone Encounter (Signed)
VASCULAR PATIENTS DISCHARGE PHONE CALL:    Person who participated on the phone call: [name]    Do you have any questions/concerns regarding your [place] meds and instructions: No, I'm just concerned about feeling a little dizzy at times,advised patient to call PCP to be seen regarding this issue.    How is your incision? Any signs of bleeding and /or infection: I have a little leak but not pus looking and I don't think I have an infection.    How is your BP and pulse rate, do you have a fever? No fever.    Do you have any pain or discomfort: Very mild, I'm back to work now, my wife drove me to work today.    Have you made a follow up appointment with your vascular surgeon? Yes I will see my surgeon on Dec. 18th.    How about your PCP and other specialists? I will call her today to make an appointment because I still fell dizzy at times.    How is your activity tolerance? Its better    What went well during your hospital stay? It was great.    Is there anything we could have done to make your stay better? No    Arturo Morton RN, BSN, [facility]  Patient Care Navigator  Cardiac Telemetry Unit [place]  [phone]

## 2016-03-18 ENCOUNTER — Other Ambulatory Visit: Payer: Self-pay | Admitting: Specialist

## 2016-03-18 DIAGNOSIS — IMO0001 Reserved for inherently not codable concepts without codable children: Secondary | ICD-10-CM

## 2016-04-01 ENCOUNTER — Ambulatory Visit (HOSPITAL_BASED_OUTPATIENT_CLINIC_OR_DEPARTMENT_OTHER)
Admission: RE | Admit: 2016-04-01 | Discharge: 2016-04-01 | Disposition: A | Discharge status: Home / Self Care | Payer: Medicare Other | Source: Ambulatory Visit | Attending: Specialist | Admitting: Specialist

## 2016-04-01 ENCOUNTER — Other Ambulatory Visit: Payer: Self-pay | Admitting: Specialist

## 2016-04-01 ENCOUNTER — Ambulatory Visit
Admission: RE | Admit: 2016-04-01 | Discharge: 2016-04-01 | Disposition: A | Discharge status: Home / Self Care | Payer: Medicare Other | Source: Ambulatory Visit | Attending: Specialist | Admitting: Specialist

## 2016-04-01 DIAGNOSIS — IMO0001 Reserved for inherently not codable concepts without codable children: Secondary | ICD-10-CM

## 2016-04-01 DIAGNOSIS — I714 Abdominal aortic aneurysm, without rupture: Secondary | ICD-10-CM | POA: Insufficient documentation

## 2016-04-01 DIAGNOSIS — D61818 Other pancytopenia: Secondary | ICD-10-CM | POA: Insufficient documentation

## 2016-04-01 DIAGNOSIS — N400 Enlarged prostate without lower urinary tract symptoms: Secondary | ICD-10-CM | POA: Insufficient documentation

## 2016-04-01 LAB — CBC AND DIFFERENTIAL
Absolute NRBC: 0 10*3/uL
Hematocrit: 28.6 % — ABNORMAL LOW (ref 42.0–52.0)
Hgb: 9.3 g/dL — ABNORMAL LOW (ref 13.0–17.0)
MCH: 34.1 pg — ABNORMAL HIGH (ref 28.0–32.0)
MCHC: 32.5 g/dL (ref 32.0–36.0)
MCV: 104.8 fL — ABNORMAL HIGH (ref 80.0–100.0)
MPV: 9.5 fL (ref 9.4–12.3)
Nucleated RBC: 0 /100 WBC (ref 0.0–1.0)
Platelets: 90 10*3/uL — ABNORMAL LOW (ref 140–400)
RBC: 2.73 10*6/uL — ABNORMAL LOW (ref 4.70–6.00)
RDW: 15 % (ref 12–15)
WBC: 3.81 10*3/uL (ref 3.50–10.80)

## 2016-04-01 LAB — MAN DIFF ONLY
Band Neutrophils Absolute: 0.5 10*3/uL (ref 0.00–1.00)
Band Neutrophils: 13 %
Basophils Absolute Manual: 0 10*3/uL (ref 0.00–0.20)
Basophils Manual: 0 %
Eosinophils Absolute Manual: 0 10*3/uL (ref 0.00–0.70)
Eosinophils Manual: 0 %
Lymphocytes Absolute Manual: 1.14 10*3/uL (ref 0.50–4.40)
Lymphocytes Manual: 30 %
Monocytes Absolute: 0.04 10*3/uL (ref 0.00–1.20)
Monocytes Manual: 1 %
Neutrophils Absolute Manual: 2.13 10*3/uL (ref 1.80–8.10)
Segmented Neutrophils: 56 %

## 2016-04-01 LAB — CELL MORPHOLOGY
Cell Morphology: ABNORMAL — AB
Platelet Estimate: DECREASED — AB

## 2016-04-01 LAB — FOLATE: Folate: 12.8 ng/mL

## 2016-04-01 LAB — VITAMIN B12: Vitamin B-12: 948 pg/mL — ABNORMAL HIGH (ref 211–911)

## 2016-04-01 LAB — LACTATE DEHYDROGENASE: LDH: 160 U/L (ref 125–331)

## 2016-04-01 LAB — HEMOLYSIS INDEX: Hemolysis Index: 6 (ref 0–18)

## 2016-04-01 LAB — HAPTOGLOBIN: Haptoglobin: 153 mg/dL (ref 14–258)

## 2016-04-01 LAB — CBC PATHOLOGIST REVIEW

## 2016-04-01 MED ORDER — IOHEXOL 350 MG/ML IV SOLN
120.0000 mL | Freq: Once | INTRAVENOUS | Status: AC | PRN
Start: 2016-04-01 — End: 2016-04-01
  Administered 2016-04-01: 120 mL via INTRAVENOUS

## 2016-04-02 LAB — PROTEIN ELECTROPHORESIS, SERUM
Albumin %: 55.9 % (ref 46.6–62.6)
Albumin, Synovial: 3.4 g/dL (ref 3.4–4.8)
Alpha-1 Glob %: 4.1 % (ref 1.7–4.1)
Alpha-1 Globulin: 0.2 g/dL (ref 0.1–0.4)
Alpha-2 Glob %: 13 % (ref 8.9–14.9)
Alpha-2 Globulin: 0.8 g/dL (ref 0.8–1.2)
Beta Glob %: 13.5 % (ref 10.9–18.9)
Beta Globulin: 0.8 g/dL (ref 0.6–1.2)
Gamma Globulin %: 13.5 % (ref 9.8–24.4)
Gamma Globulin: 0.8 g/dL (ref 0.6–1.7)
Protein, Total: 6 g/dL (ref 6.0–8.3)

## 2016-04-02 LAB — SERUM PROTEIN ELECTROPHORESIS REVIEW

## 2016-04-02 LAB — IMMUNOFIXATION ELECTROPHORESIS

## 2016-04-03 LAB — IFE REVIEW, SERUM

## 2016-04-30 ENCOUNTER — Ambulatory Visit (HOSPITAL_BASED_OUTPATIENT_CLINIC_OR_DEPARTMENT_OTHER): Payer: Medicare Other | Admitting: Hematology & Oncology

## 2016-05-02 ENCOUNTER — Encounter: Payer: Self-pay | Admitting: Specialist

## 2017-02-09 ENCOUNTER — Encounter (INDEPENDENT_AMBULATORY_CARE_PROVIDER_SITE_OTHER): Payer: Self-pay | Admitting: Specialist

## 2017-02-09 ENCOUNTER — Ambulatory Visit (INDEPENDENT_AMBULATORY_CARE_PROVIDER_SITE_OTHER): Payer: Medicare Other | Admitting: Specialist

## 2017-02-09 VITALS — BP 132/79 | HR 53 | Temp 97.7°F | Ht 68.0 in | Wt 215.0 lb

## 2017-02-09 DIAGNOSIS — I714 Abdominal aortic aneurysm, without rupture, unspecified: Secondary | ICD-10-CM

## 2017-02-09 DIAGNOSIS — I6522 Occlusion and stenosis of left carotid artery: Secondary | ICD-10-CM

## 2017-02-09 NOTE — Progress Notes (Signed)
CLINIC PROGRESS NOTE  VASCULAR SURGERY, INTERVENTION & MEDICINE    Date Time: 02/09/17@ 1:17 PM  Patient Name: Brent [name],Brent [name]  Attending Physician: Benita Gutter, MD, FACS, RPVI  Chief Complaint: L carotid stenosis    History of Present Illness:   Brent [name] is a 76 y.o. male who presents to the clinic today with hx of PEVAR (percutaneous endovascular AAA repair) in Dec 2017 that I performed without complication.  Pt denies hx of abdominal, back, flank pain or blue toe syndrome.  Pt denies amarosis fugax, TIAs, or stroke.    Past Medical History:     Past Medical History:   Diagnosis Date   . Anemia     PANCYTOPENIA-SEE DR [facility] NOTE IN [facility] 06-25-2015; CBC abnl    . Arrhythmia 12/27/2015    AFib - new onset- taking Eliquis   . Arthritis     right knee   . 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 Chronic gout     no meds; no symptoms   . Disorder of prostate     BPH    . GERD (gastroesophageal reflux disease)    . Hard to intubate     experienced once after radiation for throat cancer in 2003; subsequent intubations have presented no problems   . Hiatal hernia    . Hyperlipidemia    . Hypertensive disorder     110/60 - 120/70 at recent MD appointments   . Interstitial lung disease 2013    Oxygen Sat is at 94-95 typically; does not impede activity; no longer uses home O2   . 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 Pulmonary embolus 2013       Past Surgical History:     Past Surgical History:   Procedure Laterality Date   . BONE MARROW BIOPSY  03/2015    FOLLOWED BY DR [facility] HEMATOLOGIST   . EGD  2012    X 2   . EGD N/A 08/23/2015    Procedure: EGD;  Surgeon: Trixie Dredge, MD;  [facility];  Service: Gastroenterology;  Laterality: N/A;   . ENDOSTENT, ABDOMINAL AORTIC ANEURYSM (EVAR) N/A 03/04/2016    Procedure: Endostent, Abdominal Aortic Aneurysm (Evar);  Surgeon: Benita Gutter, MD;  Location: [facility] HEART OR;  Service: Vascular;   Laterality: N/A;   . FRACTURE SURGERY  1988    RT TIB/FIB CLOSED REDUCTION   . HERNIA REPAIR Right 2014    INGUINAL    . JOINT REPLACEMENT Left 03/2009    Left TKR,    . RT KNEE ACL  1988   . THORACOSCOPY, (VATS) Right 05/2011    RUL, RLL   . TONSILLECTOMY  1946       Family History:     Family History   Problem Relation Age of Onset   . Aplastic anemia Mother    . Hypertension Father        Social History:     Social History     Social History   . Marital status: Married     Spouse name: N/A   . Number of children: N/A   . Years of education: N/A     Social History Main Topics   . Smoking status: Former Smoker     Packs/day: 1.50     Years: 20.00     Quit date: 04/17/1982   .  Smokeless tobacco: Never Used      Comment: CIGAR 5-6 TIMES PER YEAR   . Alcohol use 1.2 - 3.6 oz/week     2 - 6 Shots of liquor per week   . Drug use: No   . Sexual activity: Not Asked     Other Topics Concern   . Exposure To Radiation? Yes     Radiation Treatment in 2003   . Exposure To Other Chemicals? No   . Exposure To Asbestos? No     Social History Narrative   . None       Allergies:   No Known Allergies    Medications:     Prior to Admission medications    Medication Sig Start Date End Date Taking? Authorizing Provider   acetaminophen (TYLENOL) 500 MG tablet Take 2 tablets (1,000 mg total) by mouth every 8 (eight) hours. 03/05/16  Yes Dillard, Janalyn Rouse, DPM   apixaban (ELIQUIS) 5 MG Take 5 mg by mouth every 12 (twelve) hours.   Yes [provider]   aspirin 81 MG tablet Aspir-81   daily   Yes [provider]   atorvastatin (LIPITOR) 40 MG tablet Take 20 mg by mouth every morning.      Yes [provider]   metoprolol succinate XL (TOPROL-XL) 25 MG 24 hr tablet  12/29/16  Yes [provider]   Multiple Vitamin (MULTIVITAMIN) tablet Take 1 tablet by mouth daily.   Yes [provider]   pantoprazole (PROTONIX) 40 MG tablet Take 40 mg by mouth every morning.      Yes [provider]    Coenzyme Q10 (CO Q 10 PO) Take 1 capsule by mouth daily.    [provider]   lisinopril (PRINIVIL,ZESTRIL) 10 MG tablet Take 20 mg by mouth every morning.       [provider]   tamsulosin (FLOMAX) 0.4 MG Cap Take 0.4 mg by mouth daily. 09/03/15   [provider]   tolterodine (DETROL LA) 2 MG 24 hr capsule Take 2 mg by mouth daily. 12/17/15   [provider]       Review of Systems:   General: negative for - chills, fever, night sweats, weight gain or weight loss  Psychological: negative for - anxiety, depression, hallucinations, amnesia, suicidal ideation  Ophthalmic: negative for - blurry vision, decreased vision, double vision, blindness  ENT: negative for - epistaxis, deafness, oral lesions, sore throat, tinnitus, vertigo, dysphonia, dysphagia, odynophagia  Allergy and Immunology: negative for - hives, latex, postnasal drip or seasonal allergies  Hematological and Lymphatic:  negative for - bleeding problems, blood clots, bruising, jaundice, night sweats, swollen lymph nodes or weight loss  Endocrine:  negative for - breast masses, palpitations, polydipsia/polyuria, temperature intolerance or unexpected weight changes  Respiratory: negative for - cough, hemoptysis, orthopnea,shortness of breath, stridor, tachypnea or wheezing  Cardiovascular: negative for - chest pain, dyspnea on exertion, irregular heartbeat, murmur, orthopnea, palpitations, paroxysmal nocturnal dyspnea, rapid heart rate Gastrointestinal: negative for - abdominal pain, appetite loss, blood in stools, constipation, diarrhea, gas/bloating, hematemesis, melena, nausea/vomiting, stool incontinence  Genito-Urinary: negative for - dysuria, hematuria, incontinence, urinary frequency/urgency  Musculoskeletal: negative for - gait disturbance, joint pain, joint swelling, muscle pain or muscular weakness  Neurological: negative for - TIA or stroke symptoms, bowel and bladder control changes, confusion, dizziness, gait  disturbance, headaches, impaired coordination/balance, memory loss, numbness/tingling, seizures, tremors, paralysis  Dermatological:  negative for lumps, pruritus, rash and skin lesions   Physical  Exam:     Vitals:    02/09/17 1254   BP: 132/79   Pulse:    Temp:    SpO2:        Body mass index is 32.69 kg/m.    HEENT:  N/C, AT, CN 2-12 intact, no LAD, no TMG, O/P clear, bilateral carotid bruits  CARDIAC:  S1, S2, RRR, no M/G/R  CHEST:  CTA  ABD:  S/NT/ND, no pulsatile abdominal masses, no tenderness, no guarding, no hernias, normal BS, no abdominal or femoral bruits   EXTR:  No C/C/E, no ulcers, no varicose veins  PULSES:    R: 2+ radial, ulnar, brachial, carotid, femoral, popliteal, DP, PT  L:  2+ radial, ulnar, brachial, carotid, femoral, popliteal, DP, PT    Labs:   None    Rads:   CTA of Neck (FRC 9/25):  L common carotid artery (mid) 75% stenosis    Problem List:     Patient Active Problem List   Diagnosis   . Dyspnea on exertion   . HTN (hypertension)   . Hyperlipidemia   . On home oxygen therapy   . History of pulmonary embolism   . GERD (gastroesophageal reflux disease)   . Anemia due to bone marrow failure   . Thrombocytopenia   . Neutropenia   . Atrial fibrillation       Assessment:   76 y.o. 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  No CEA (carotid endarterectomy) or carotid stent is indicated as his L CCA stenosis is <75%.  If he attains a >80% stenosis, then a L CCA stent (retrograde via L CCA neck cutdown) would be indicated.  Presently would manage carotid dz with ASA and statin.  Consider high dose Lipitor (80 mg qd) due to progression of carotid disease despite lower dose of Lipitor (40 mg qd) that he's on presently.  Will repeat carotid duplex in 6 months to evaluate for progression of disease.       2.  Aortic Duplex in 6  Months to r/o endoleak from his PEVAR repair of his AAA.      Thank you for allowing me the privilege of participating in the care of your  patient.  Please call me if you have any questions.      Sincerely,    Brent Szymborski Lutricia Horsfall, MD, FACS, RPVI  Vascular Surgery, Intervention, & Medicine    [facility]  Director - M2[facility] Vascular Quality Initiative & Vascular Laboratory  Assistant Professor of Vascular Surgery [facility] [employer].   Tel:  [phone]  Cell:  [phone]

## 2017-02-24 ENCOUNTER — Encounter (INDEPENDENT_AMBULATORY_CARE_PROVIDER_SITE_OTHER): Payer: Self-pay

## 2017-06-09 ENCOUNTER — Encounter: Payer: Self-pay | Admitting: Gastroenterology

## 2017-06-26 ENCOUNTER — Telehealth: Payer: Medicare Other

## 2017-06-26 NOTE — Anesthesia Preprocedure Evaluation (Addendum)
Anesthesia Evaluation    AIRWAY    Mallampati: II    TM distance: >3 FB  Neck ROM: full  Mouth Opening:full   CARDIOVASCULAR    cardiovascular exam normal       DENTAL    no notable dental hx     PULMONARY    pulmonary exam normal     OTHER FINDINGS                  (12/29/2015) Echo: EF 60%; DD2    (01/01/2016) Cardiology. Dr. 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 Zollie Pee:     75% symptomatic L common carotid artery stenosis    (05/11/2017) Hct 27.7, Plt 59    (06/10/2017) Hematology, Dr. Karie Mainland:      Idiopathic pancytopenia of unclear significance     Recommend repeat CBCs every 3 months            Anesthesia Plan    ASA 3     general                     Detailed anesthesia plan: general IV            informed consent obtained    Plan discussed with CRNA.    ECG reviewed               Signed by: Verdis Prime 06/26/17 4:34 PM

## 2017-06-26 NOTE — Pre-Procedure Instructions (Signed)
ANESTHESIA DEPT NOTIFIED OF PLATELET LEVEL-59 FEB 2019 PT HAS HX THROMBOCYTOPENIA SINCE 2014, DR ALI,  SYED NOTES FROM June 06 2017 IN EPIC.   PT WILL GET A CARDIO CL July 08 2017 -CARDIAC CARE.

## 2017-06-26 NOTE — Pre-Procedure Instructions (Addendum)
PSSRNPATIENTINSTRUCTIONSADULT    Date of Procedure __April 11__.    Arrival Time is _1.5__ hours prior to surgical time which is currently scheduled for __0930 ARRIVE 0800__.  Preop will call you after 4pm the business day prior to your scheduled procedure to verify this time, time can change up until this time.    Eating and Drinking Instructions for day of surgery-  If time changes - clear liquids 4 hours prior to arrival time.  CLEAR LIQUIDS INCLUDE:WATER, BLACK COFFEE/TEA (NO MILK, CREAM, NON-DAIRY CREAMER OR SUGAR), CARBONATED BEVERAGES, JUICES WITHOUT PULP (APPLE), GATORADE    NPO(NO SOLIDS) PER SURGEONS INSTRUCTIONS, CLEAR FLUIDS TILL 0400 April 11    Special Medication Instructions from Anesthesiologist     acetaminophen (TYLENOL) 500 MG tablet  Take 2 tablets (1,000 mg total) by mouth every 8 (eight) hours., Starting Wed 03/05/2016, OTC  Patient taking differently: 500 mg Oral Every 8 hours, Reported on 06/26/2017 Last Dose: Taking  Note written 06/26/2017 1300: TAKE AS NEEDED    apixaban (ELIQUIS) 5 MG  Take 5 mg by mouth every 12 (twelve) hours.  Last Dose: Taking  Note written 06/26/2017 1249: PT WILL CHECK WITH CARDIO WHEN TO STOP PRE SURGERY    atorvastatin (LIPITOR) 40 MG tablet  Take 20 mg by mouth every morning.  Informant: Self, Last Dose: Taking  Note written 06/26/2017 1248: MAY TAKE MORNING OF SURGERY      Coenzyme Q10 (CO Q 10 PO)  Take 1 capsule by mouth as needed.  Informant: Self, Last Dose: Taking  Note written 06/26/2017 1300: HOLD 1 WEEK PRE SURGERY     levothyroxine (SYNTHROID, LEVOTHROID) 25 MCG tablet  levothyroxine 25 mcg tablet-MORNING Last Dose: Taking  Note written 06/26/2017 1252: TAKE MORNING OF SURGERY     metoprolol succinate XL (TOPROL-XL) 25 MG 24 hr tablet  Take 25 mg by mouth every morning.  Last Dose: Taking  Note written 06/26/2017 1250: TAKE MORNING OF SURGERY     Multiple Vitamin (MULTIVITAMIN) tablet  Take 1 tablet by mouth daily.  Informant: Self, Last Dose: Taking  Note  written 06/26/2017 1252: HOLD 1 WEEK ORE SURGERY     pantoprazole (PROTONIX) 40 MG tablet  Take 20 mg by mouth every morning.  Informant: Pharmacy, Last Dose: Taking  Note written 06/26/2017 1249: TAKE MORNING OF SURGERY     raNITIdine (ZANTAC) 75 MG tablet  Take 75 mg by mouth nightly.  Last Dose: Taking  Note written 06/26/2017 1302: TAKE AS USUAL     . Follow eating and drinking restrictions as instructed by surgeon and/or pre surgical services nurse.  . If applicable, follow your surgeon's bowel prep instructions.  . Failure to do so may result in cancellation of your procedure.  . Follow surgeons instructions if they gave specific medication instructions.      Hospital Address and Arrival Instructions:   [address], [place], [place] [address]  [facility] is where you will enter.  A complimentary valet is available at the front [place] entrance of the hospital.  Please call [phone] morning of surgery if you need assistance upon arrival.    Upon arrival in the hospital main lobby via [employer], you will proceed to:  1) Patient Registration, which is all the way down the hall on the left.  2) Once registration is completed, you will be escorted by the registration staff to the Surgical Services Waiting Area where you wait until a PreOp clinical team member escorts you to a  room and initiates the PreOp process.  3) You will need to have a valid photo I.D.,  insurance card and co-pay form of payment if required by insurance.    Pre Surgical General Instructions    1. Bathe or shower the morning of the procedure with an anti-bacterial soap before arriving (unless instructed to use hibiclens).  2. Do not apply lotion, perfume, cologne, or hair-care products such as hair spray or gels.  3. Do not shave your surgical site at home.  4. Do not wear makeup, jewelry including body piercing, watches, earrings, or rings.   5. You may brush your teeth and gargle on the morning of surgery but do not  swallow any water.  6. Wear casual, loose fitting and comfortable clothing. A gown will be provided.  7. Plan to leave unnecessary valuables, credit cards (except for co-pay payment use) and jewelry at    home or with a companion on day of surgery for safe keeping. The hospital is not responsible for lost/stolen items.  8. If you wear contacts please leave them at home. If you wear glasses, please bring a case.  9. Dentures - we will provide a container  10. Hearing aids, you may bring dos but you will be asked to remove them before surgery.  11. Please arrange for someone to drive you home. For your safety you will not be allowed to drive home   after sedation or anesthesia. A responsible adult must be present to accompany you home when you are ready to leave. We strongly recommend that all patients have an adult at home with them for the first 24 hours after surgery.  12. Notify your doctor if you develop any sign of illness before the date of your surgery. Report                 symptoms such as: high fever, sore throat, or other infection, breathing difficulties or chest pain.  13. Discontinue herbal supplements and herbal/green tea one week prior to surgery.      Below is a link/web address  to the Preparing for Your Procedure video that walks you through the surgical experience.   [email or web address]    Possible Anesthesia Side Effects  . Nausea and vomiting. This common side effect usually occurs immediately after the procedure, but some people may continue to feel sick for a day or two. Anti-nausea medicines can help.  . Dry mouth. You may feel parched when you wake up. As long as you're not too nauseated, sipping water can help take care of your dry mouth.  . Sore throat or hoarseness. The tube put in your throat to help you breathe during surgery can leave you with a sore throat after it's removed.  . Chills and shivering. It's common for your body temperature to drop during general  anesthesia. Your doctors and nurses will make sure your temperature doesn't fall too much during surgery, but you may wake up shivering and feeling cold. Your chills may last for a few minutes to hours.  . Confusion and fuzzy thinking. When first waking from anesthesia, you may feel confused, drowsy, and foggy. This usually lasts for just a few hours, but for some people - especially older adults - confusion can last for days or weeks.  . Muscle aches. The drugs used to relax your muscles during surgery can cause soreness afterward.  . Itching. If narcotic (opioid) medications are used during or after your operation, you may be itchy.  This is a common side effect of this class of drugs.  . Bladder problems. You may have difficulty passing urine for a short time after general anesthesia.  . Dizziness. You may feel dizzy when you first stand up. Drinking plenty of fluids should help you feel better.

## 2017-07-09 ENCOUNTER — Ambulatory Visit: Payer: Self-pay

## 2017-07-09 ENCOUNTER — Encounter
Admission: RE | Disposition: A | Discharge status: Home / Self Care | Payer: Self-pay | Source: Ambulatory Visit | Attending: Gastroenterology

## 2017-07-09 ENCOUNTER — Ambulatory Visit
Admission: RE | Admit: 2017-07-09 | Discharge: 2017-07-09 | Disposition: A | Discharge status: Home / Self Care | Payer: Medicare Other | Source: Ambulatory Visit | Attending: Gastroenterology | Admitting: Gastroenterology

## 2017-07-09 ENCOUNTER — Ambulatory Visit: Payer: Medicare Other | Admitting: Anesthesiology

## 2017-07-09 DIAGNOSIS — K5730 Diverticulosis of large intestine without perforation or abscess without bleeding: Secondary | ICD-10-CM | POA: Insufficient documentation

## 2017-07-09 DIAGNOSIS — Z1211 Encounter for screening for malignant neoplasm of colon: Secondary | ICD-10-CM | POA: Insufficient documentation

## 2017-07-09 DIAGNOSIS — D124 Benign neoplasm of descending colon: Secondary | ICD-10-CM | POA: Insufficient documentation

## 2017-07-09 DIAGNOSIS — K648 Other hemorrhoids: Secondary | ICD-10-CM | POA: Insufficient documentation

## 2017-07-09 DIAGNOSIS — Z86010 Personal history of colonic polyps: Secondary | ICD-10-CM | POA: Insufficient documentation

## 2017-07-09 HISTORY — PX: COLONOSCOPY, DIAGNOSTIC (SCREENING): SHX174

## 2017-07-09 LAB — CBC AND DIFFERENTIAL
Absolute NRBC: 0 10*3/uL (ref 0.00–0.00)
Basophils Absolute Automated: 0.01 10*3/uL (ref 0.00–0.08)
Basophils Automated: 0.5 %
Eosinophils Absolute Automated: 0.02 10*3/uL (ref 0.00–0.44)
Eosinophils Automated: 1 %
Hematocrit: 31.3 % — ABNORMAL LOW (ref 37.6–49.6)
Hgb: 10.4 g/dL — ABNORMAL LOW (ref 12.5–17.1)
Immature Granulocytes Absolute: 0 10*3/uL (ref 0.00–0.07)
Immature Granulocytes: 0 %
Lymphocytes Absolute Automated: 0.8 10*3/uL (ref 0.42–3.22)
Lymphocytes Automated: 41.7 %
MCH: 35.9 pg — ABNORMAL HIGH (ref 25.1–33.5)
MCHC: 33.2 g/dL (ref 31.5–35.8)
MCV: 107.9 fL — ABNORMAL HIGH (ref 78.0–96.0)
MPV: 11 fL (ref 8.9–12.5)
Monocytes Absolute Automated: 0.06 10*3/uL — ABNORMAL LOW (ref 0.21–0.85)
Monocytes: 3.1 %
Neutrophils Absolute: 1.03 10*3/uL — ABNORMAL LOW (ref 1.10–6.33)
Neutrophils: 53.7 %
Nucleated RBC: 0 /100 WBC (ref 0.0–0.0)
Platelets: 59 10*3/uL — ABNORMAL LOW (ref 142–346)
RBC: 2.9 10*6/uL — ABNORMAL LOW (ref 4.20–5.90)
RDW: 16 % — ABNORMAL HIGH (ref 11–15)
WBC: 1.92 10*3/uL — ABNORMAL LOW (ref 3.10–9.50)

## 2017-07-09 LAB — CELL MORPHOLOGY
Cell Morphology: ABNORMAL — AB
Platelet Estimate: DECREASED — AB

## 2017-07-09 LAB — TYPE AND SCREEN
AB Screen Gel: NEGATIVE
ABO Rh: AB POS

## 2017-07-09 SURGERY — DONT USE, USE 1094-COLONOSCOPY, DIAGNOSTIC (SCREENING)
Anesthesia: Anesthesia General | Site: Anus | Wound class: Clean Contaminated

## 2017-07-09 MED ORDER — PROPOFOL 10 MG/ML IV EMUL (WRAP)
INTRAVENOUS | Status: DC | PRN
Start: 2017-07-09 — End: 2017-07-09
  Administered 2017-07-09: 100 mg via INTRAVENOUS

## 2017-07-09 MED ORDER — SODIUM CHLORIDE 0.9 % IV SOLN
INTRAVENOUS | Status: DC | PRN
Start: 2017-07-09 — End: 2017-07-09

## 2017-07-09 MED ORDER — PROPOFOL 10 MG/ML IV EMUL (WRAP)
INTRAVENOUS | Status: AC
Start: 2017-07-09 — End: ?
  Filled 2017-07-09: qty 40

## 2017-07-09 MED ORDER — LIDOCAINE 1% BUFFERED - CNR/OUTSOURCED
0.5000 mL | Freq: Once | INTRAMUSCULAR | Status: AC
Start: 2017-07-09 — End: 2017-07-09
  Administered 2017-07-09: 0.3 mL via INTRADERMAL

## 2017-07-09 MED ORDER — PROPOFOL INFUSION 10 MG/ML
INTRAVENOUS | Status: DC | PRN
Start: 2017-07-09 — End: 2017-07-09
  Administered 2017-07-09: 160 ug/kg/min via INTRAVENOUS

## 2017-07-09 MED ORDER — LIDOCAINE HCL 2 % IJ SOLN
INTRAMUSCULAR | Status: DC | PRN
Start: 2017-07-09 — End: 2017-07-09
  Administered 2017-07-09: 100 mg

## 2017-07-09 MED ORDER — LIDOCAINE HCL (PF) 2 % IJ SOLN
INTRAMUSCULAR | Status: AC
Start: 2017-07-09 — End: ?
  Filled 2017-07-09: qty 5

## 2017-07-09 MED ORDER — AMPICILLIN SODIUM 1 G IJ SOLR
1.0000 g | Freq: Once | INTRAMUSCULAR | Status: AC
Start: 2017-07-09 — End: 2017-07-09
  Administered 2017-07-09: 1 g via INTRAVENOUS
  Filled 2017-07-09: qty 1000

## 2017-07-09 MED ORDER — LACTATED RINGERS IV SOLN
INTRAVENOUS | Status: DC
Start: 2017-07-09 — End: 2017-07-09
  Administered 2017-07-09: 1000 mL via INTRAVENOUS

## 2017-07-09 SURGICAL SUPPLY — 53 items
BASIN EME PLS 700ML LF GRAD TRNLU PGMNT (Patient Supply)
BASIN EMESIS 700 ML GRADUATED TRANSLUCENT PIGMENT FREE PLASTIC (Patient Supply) IMPLANT
CATHETER BALLOON DILATATION CRE PEBAX (Balloons)
CATHETER OD6 FR ODSEC12-13.5-15 MM L180 CM CREâ„¢ BALLOON DILATATION L8 (Balloons) IMPLANT
CONTAINER HISTOLOGY 60 ML 30 ML GRADUATE LEAK RESISTANT O RING PREFILL (Procedure Accessories) ×1 IMPLANT
DILATOR ESCP PEBAX CRE 6FR 12-13.5-15MM (Balloons)
ELECTRODE ADULT PATIENT RETURN L9 FT REM POLYHESIVE ACRYLIC FOAM (Procedure Accessories) ×1 IMPLANT
ELECTRODE PATIENT RETURN L9 FT VALLEYLAB (Procedure Accessories) ×1
ELECTRODE PT RTN RM PHSV ACRL FM C30- LB (Procedure Accessories) ×1
FORCEPS BIOPSY L240 CM +2.8 MM HOT OD2.2 (Endoscopic Supplies)
FORCEPS BIOPSY L240 CM +2.8 MM HOT OD2.2 MM RADIAL JAW (Endoscopic Supplies) IMPLANT
FORCEPS BIOPSY L240 CM JUMBO MICROMESH (Instrument)
FORCEPS BIOPSY L240 CM JUMBO MICROMESH TEETH STREAMLINE CATHETER (Instrument) IMPLANT
FORCEPS BIOPSY L240 CM LARGE CAPACITY (Instrument)
FORCEPS BIOPSY L240 CM MICROMESH TEETH STREAMLINE CATHETER NEEDLE (Instrument) IMPLANT
FORCEPS BIOPSY L240 CM STANDARD CAPACITY (Instrument)
FORCEPS BX +2.8MM RJ 4 2.2MM 240CM HOT (Endoscopic Supplies)
FORCEPS BX SS JMB RJ 4 2.8MM 240CM STRL (Instrument)
FORCEPS BX SS LG CPC RJ 4 2.4MM 240CM (Instrument)
FORCEPS BX STD CPC RJ 4 2.2MM 240CM STRL (Instrument)
FORCEPS RADIAL JAW 3 W/ NEEDLE (Endoscopic Supplies) IMPLANT
GOWN ISL SMS XL LF HKLP NK KNIT CUF BLU (Patient Supply) ×2
GOWN ISO YELLOW UNIVERSAL (Gown) IMPLANT
GOWN ISOLATION XL HOOK LOOP NECK KNIT (Patient Supply) ×2 IMPLANT
KIT ENDOSCOPIC 2 BUNDLE COMPLIANCE (Endoscopic Supplies) ×1
KIT ENDOSCOPIC 2 BUNDLE COMPLIANCE ENDOKIT 2 OZ (Endoscopic Supplies) ×1 IMPLANT
KIT GI CUSTOM ILH (Endoscopic Supplies) ×1
KIT SMARTPREP APC 30 30ML (Kits) IMPLANT
LINER SCT 1500CC THNWL MDVC FLX ADV LF (Suction) IMPLANT
MARKER ENDOSCOPIC PERMANENT INDICATION (Syringes, Needles)
MARKER ENDOSCOPIC PERMANENT INDICATION DARK SYRINGE SPOT EX 5 ML (Syringes, Needles) IMPLANT
MARKER ESCP 5ML SPOT EX PERM INDCT DRK (Syringes, Needles)
NEEDLE INJC DISP NM LOWER GI (Needles) IMPLANT
NET SPEC RTRVL STD RTHNT 2.5MM 230CM LF (Urology Supply)
NET SPECIMEN RETRIEVAL L230 CM STANDARD (Urology Supply)
NET SPECIMEN RETRIEVAL L230 CM STANDARD SHEATH OD2.5 MM L6 CM X W3 CM (Urology Supply) IMPLANT
PAD ELECTROSRG GRND REM W CRD (Procedure Accessories) IMPLANT
SNARE ESCP MED OVL CPTFLX 2.4MM 240CM (Endoscopic Supplies)
SNARE ESCP MIC CPTVTR 13MM 240IN STRL (GE Lab Supplies) ×2
SNARE MD OVAL 240CM 2.4 MM CPTFLX LP FLXBL ENDOSCOPIC POLYPECTOMY 27MM (Endoscopic Supplies) IMPLANT
SNARE MEDIUM OVAL L240 CM OD2.4 MM (Endoscopic Supplies)
SNARE SMALL HEXAGON CAPTIVATOR STIFF ENDOSCOPIC POLYPECTOMY (GE Lab Supplies) ×1 IMPLANT
SOL FORMALIN 10% PREFILL 30ML (Procedure Accessories) ×2
SPONGE GAUZE L4 IN X W4 IN 4 PLY HIGH (Sponge)
SPONGE GAUZE L4 IN X W4 IN 4 PLY NONWOVEN LINT FREE CURITY RAYON (Sponge) IMPLANT
SPONGE GZE RYN PLSTR CRTY 4X4IN LF NS 4 (Sponge)
SYRINGE 50 ML GRADUATE NONPYROGENIC DEHP (Syringes, Needles) ×1
SYRINGE 50 ML GRADUATE NONPYROGENIC DEHP FREE PVC FREE BD MEDICAL (Syringes, Needles) ×1 IMPLANT
SYRINGE MED 50ML LF STRL GRAD N-PYRG (Syringes, Needles) ×1
TRAP SPECIMEN LF (Procedure Accessories) ×2 IMPLANT
VALVE AIR/WATER DEFENDO KIT SUCTION (Suction) ×1
VALVE AIR/WATER DEFENDO KIT SUCTION BUTTON BIOPSY (Suction) ×1 IMPLANT
VALVE DEFENDO SUCTION AIR/WTR (Suction) ×1

## 2017-07-09 NOTE — Discharge Instr - AVS First Page (Addendum)
Endoscopy Discharge Instructions    General Instructions:  1. Following sedation, your judgement, perception, and coordination are considered impaired. Even though you may feel awake and alert, you are considered legally intoxicated. Therefore, until the next morning;   Do not Drive   Do not operate appliances or equipment that requires reaction time (e.g. Stove, electrical tools, machinery)   Do not sign legal documents or be involved in important decisions.   Do not smoke if alone   Do not drink alcoholic beverages   Go directly home and rest for several hours before resuming your routine activities.   It is highly recommended to have a responsible adult stay with you for the next 24 hours    2. Tenderness, swelling or pain may occur at the IV site where you received sedation. If you experience this, apply warm soaks to the area. Notify your physician if this persists.    Instructions Specific To Procedures - Report To Physician Any Of The Following:       Colon/Sigmoidoscopy/Proctoscopy   1. Severe and persistent abdominal pain/bloating which does not subside within 2-3 hours   2. Large amount of rectal bleeding (some mucosal blood streaking may occur, especially if biopsy or polypectomy was done or if hemorrhoids are present.   3. Nausea/vomitting   4. Fevers/Chills within 24 hours after procedure. Temp>101deg F              5. Chest pain or difficulty breathing        In Addition:   If polyp has been removed, DO NOT take aspirin or aspirin containing products (e.g. Anacin, Alka Seltzer, Bufferin, Etc.) or non-steroidal anti-inflammatory drugs (e.g. Advil, Motrin, etc.) for 5 days unless otherwise advised by doctor. Tylenol or extra Strength Tylenol is permitted.      Additional Discharge Instructions  Your diet after the procedure: begin with clear liquid diet after the procedure.  Advance your diet as tolerated.  May be able to eat a regular diet by tonight.  Resume all medications unless otherwise  indicated.  Special Instructions: Restart Eliquis in 3 days  Prescriptions given: None        If you have questions or problems contact your MD immediately. If you need immediate attention, call your MD, 911 and/or go to nearest emergency room.    Post Anesthesia Discharge Instructions    Although you may be awake and alert in the recovery room, small amounts of anesthetic remain in your system for about 24 hours.  You may feel tired and sleepy during this time.      You are advised to go directly home from the hospital.    Plan to stay at home and rest for the remainder of the day.    It is advisable to have someone with you at home for 24 hours after surgery.    Do not operate a motor vehicle, or any mechanical or electrical equipment for the next 24 hours.      Be careful when you are walking around, you may become dizzy.  The effects of anesthesia and/or medications are still present and drowsiness may occur    Do not consume alcohol, tranquilizers, sleeping medications, or any other non prescribed medication for the remainder of the day.    Diet:  begin with liquids, progress your diet as tolerated or as directed by your surgeon.  Nausea and vomiting may occur in the next 24 hours.

## 2017-07-09 NOTE — PACU (Addendum)
Patient out of procedure alert and awake with VSS> Patient wife at the bedside for discharge instructions. Patient drinking water and having crackers.      Patient wife stated angrily that she did not want RN to go over discharge instructions stating " we dont need you to read to [place] we have done this before we know what to do". While RN was taking out IV and taking off Vitals equipment she stated important highlights from discharge paperwork anyway. Patients wife then stated that if RN thought she was helping her husband get dressed she was out of her mind and that she had been taking care of her mother and she was not in the mood. PAtients wife also told husband to shutup when he attempted to joke with the RN. Patients wife also stated she she felt rushed so RN brought more crackers to patient and water. RN gave patient a warm blanket and brought more crackers and water and told the wife to let RN know when patient was ready to discharge so that they didn't feel rushed.

## 2017-07-09 NOTE — H&P (Signed)
Updated H and P scanned in  No changes in exam or history

## 2017-07-09 NOTE — Anesthesia Postprocedure Evaluation (Signed)
Anesthesia Post Evaluation    Patient: Brent [name]    Procedure(s):  COLONOSCOPY    Anesthesia type: general    Last Vitals:   Vitals:    07/09/17 1101   BP: 102/58   Pulse: (!) 51   Resp: 18   Temp: 36.5 C (97.7 F)   SpO2: 99%       Anesthesia Post Evaluation:     Patient Evaluated: PACU  Patient Participation: complete - patient participated  Level of Consciousness: awake and alert  Pain Score: 0  Pain Management: adequate    Airway Patency: patent    Anesthetic complications: No      PONV Status: none    Cardiovascular status: acceptable  Respiratory status: acceptable  Hydration status: acceptable        Anesthesia Qualified Clinical Data Registry 2018    PACU Reintubation  Did the Patient have general anesthesia with intubation: No        PONV Adult  Is the patient aged 20 or older: Yes  Did the patient receive recieve a general anesthestic: No          PONV Pediatric  Is the patient aged 43-17? No            PACU Transfer Checklist Protocol  Was the patient transferred to the PACU at the conclusion of surgery? Yes  Was a checklist or transfer protocol used? Yes    ICU Transfer Checklist Protocol  Was the patient transferred to the ICU at the conclusion of surgery? No      Post-op Pain Assessment Prior to Anesthesia Care End  Age >=18 and assessed for pain in PACU: Yes  Pacu pain score <7/10: Yes      Perioperative Mortality  Perioperative mortality prior to Anesthesia end time: No    Perioperative Cardiac Arrest  Did the patient have an unanticipated intraoperative cardiac arrest between anesthesia start time and anesthesia end time? No    Unplanned Admission to ICU  Did the patient have an unplanned admission to the ICU (not initially anticipated at anesthesia start time)? No      Signed by: Oley Balm, 07/09/2017 11:02 AM

## 2017-07-09 NOTE — Transfer of Care (Signed)
Anesthesia Transfer of Care Note    Patient: Brent [name]    Procedures performed: Procedure(s):  COLONOSCOPY    Anesthesia type: General TIVA    Patient location:Phase II PACU    Last vitals:   Vitals:    07/09/17 0833   BP: 131/73   Pulse: (!) 54   Resp: 16   Temp: (!) 35.9 C (96.7 F)   SpO2: 96%       Post pain: Patient not complaining of pain, continue current therapy      Mental Status:awake and alert     Respiratory Function: tolerating room air    Cardiovascular: stable    Nausea/Vomiting: patient not complaining of nausea or vomiting    Hydration Status: adequate    Post assessment: no apparent anesthetic complications    Signed by: Oley Balm  07/09/17 11:00 AM

## 2017-07-12 ENCOUNTER — Encounter: Payer: Self-pay | Admitting: Gastroenterology

## 2017-07-13 LAB — PREPARE RBC
Expiration Date: 201905102359
UTYPE: A POS

## 2017-08-03 ENCOUNTER — Encounter (INDEPENDENT_AMBULATORY_CARE_PROVIDER_SITE_OTHER): Payer: Self-pay

## 2017-08-03 ENCOUNTER — Ambulatory Visit (INDEPENDENT_AMBULATORY_CARE_PROVIDER_SITE_OTHER): Payer: Medicare Other | Admitting: Specialist

## 2017-08-03 ENCOUNTER — Other Ambulatory Visit (INDEPENDENT_AMBULATORY_CARE_PROVIDER_SITE_OTHER): Payer: Medicare Other

## 2017-08-11 ENCOUNTER — Ambulatory Visit (INDEPENDENT_AMBULATORY_CARE_PROVIDER_SITE_OTHER): Payer: Medicare Other

## 2017-08-11 DIAGNOSIS — I714 Abdominal aortic aneurysm, without rupture, unspecified: Secondary | ICD-10-CM

## 2017-08-11 DIAGNOSIS — I6522 Occlusion and stenosis of left carotid artery: Secondary | ICD-10-CM

## 2017-10-26 ENCOUNTER — Ambulatory Visit (INDEPENDENT_AMBULATORY_CARE_PROVIDER_SITE_OTHER): Payer: Medicare Other | Admitting: Specialist

## 2017-11-19 ENCOUNTER — Ambulatory Visit
Admission: RE | Admit: 2017-11-19 | Discharge: 2017-11-19 | Disposition: A | Discharge status: Home / Self Care | Payer: Medicare Other | Source: Ambulatory Visit | Attending: Hematology & Oncology | Admitting: Hematology & Oncology

## 2017-11-19 DIAGNOSIS — D469 Myelodysplastic syndrome, unspecified: Secondary | ICD-10-CM | POA: Insufficient documentation

## 2017-11-19 LAB — TYPE AND SCREEN
AB Screen Gel: NEGATIVE
ABO Rh: AB POS

## 2017-11-20 ENCOUNTER — Ambulatory Visit: Payer: Medicare Other

## 2017-11-21 ENCOUNTER — Ambulatory Visit: Payer: Medicare Other | Attending: Hematology & Oncology

## 2017-11-21 VITALS — BP 110/54 | HR 58 | Temp 98.0°F | Resp 18

## 2017-11-21 DIAGNOSIS — D469 Myelodysplastic syndrome, unspecified: Secondary | ICD-10-CM

## 2017-11-21 LAB — PREPARE RBC
Expiration Date: 201909212359
Expiration Date: 201909212359
Status: TRANSFUSED
Status: TRANSFUSED
UTYPE: A POS
UTYPE: A POS

## 2017-11-29 DIAGNOSIS — Z5189 Encounter for other specified aftercare: Secondary | ICD-10-CM

## 2017-11-29 HISTORY — DX: Encounter for other specified aftercare: Z51.89

## 2017-12-03 ENCOUNTER — Encounter (HOSPITAL_BASED_OUTPATIENT_CLINIC_OR_DEPARTMENT_OTHER): Payer: Self-pay

## 2017-12-03 NOTE — Progress Notes (Signed)
:  Room        VASCULAR SURGERY PATIENT FORM       Appt Date/Time: 12/07/17 9:00   PCP: Arneta Cliche, MD  Referring Physician:    New Patient Follow-Up Post-op   [place] [facility] Other Imaging     Chief Complaint/HPI: hx of PEVAR (02/2016) Date of Last Visit: 02/09/2017   No Known Allergies     Selected Medications:    Coumadin Plavix Eliquis Xarelto Aspirin Fish Oil Lovenox   Insulin Metformin Other Diabetes Atorvastatin  Other Cholesterol    MHx:  Stroke/TIA/Seizures Thyroid  Diabetes   Myocardial Infarction Arrhythmia  COPD/Asthma (other lung)   Kidney Disease Liver Disease DVT   Wounds Musculoskeletal (spine) Cancer             Smoker           Former Smoker          Never Smoker    Vital Signs this Visit Pulses  HT WT HR   Rad Ulnar Brach Fem Pop DP PT       L          BP L BP R SpO2  R                        Imaging results:           Plan of Care:

## 2017-12-07 ENCOUNTER — Other Ambulatory Visit: Payer: Self-pay | Admitting: Internal Medicine

## 2017-12-07 ENCOUNTER — Ambulatory Visit (INDEPENDENT_AMBULATORY_CARE_PROVIDER_SITE_OTHER): Payer: Medicare Other | Admitting: Specialist

## 2017-12-07 VITALS — BP 108/72 | HR 78 | Temp 97.7°F | Ht 68.0 in | Wt 208.0 lb

## 2017-12-07 DIAGNOSIS — Z95828 Presence of other vascular implants and grafts: Secondary | ICD-10-CM

## 2017-12-07 DIAGNOSIS — R102 Pelvic and perineal pain: Secondary | ICD-10-CM

## 2017-12-07 DIAGNOSIS — I714 Abdominal aortic aneurysm, without rupture, unspecified: Secondary | ICD-10-CM

## 2017-12-07 DIAGNOSIS — R1030 Lower abdominal pain, unspecified: Secondary | ICD-10-CM

## 2017-12-07 DIAGNOSIS — I6523 Occlusion and stenosis of bilateral carotid arteries: Secondary | ICD-10-CM

## 2017-12-07 NOTE — Progress Notes (Addendum)
OFFICE VISIT  [place] VASCULAR    Patient Name: Brent [name], Brent [name]  Vascular Surgeon: Marcell Anger, M.D.  Chief Complaint: Other (6 months f/u to hx of PEVAR )      History of Present Illness:   We had the pleasure of seeing Brent [name] in the office for follow up evaluation.  The patient is a 77 y.o. male with a history of AAA s/p EVAR and carotid stenosis.  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  He is followed by Dr. Karie Mainland for that. He was also evaluated at [facility] for possible bone marrow transplant. He receives regular infusions for MDS including medicines or blood transfusions when needed.  He is very active and participates in spin class.  He went to spin class and participated at high intensity for over an hour without symptoms of chest pain or shortness of breath.  The patient denies any recent symptoms of lateralizing neurologic ischemia including stroke, TIA, amaurosis fugax, or monocular visual changes.  Pt denies any symptoms of abdominal pain, chest pain, flank pain, or blue toe syndrome.  Pt has a history of paroxsymal a-fib.  He was previously on Eliquis but it was discontinued due to his MDS. He does not take aspirin. He is on atorvastatin but is unsure of the dose.  The patient denies any recent symptoms of post-prandial pain, food aversion, or unintentional weight loss. He reports intentional weight loss through improved diet and exercise.  He reports he has reduced his consumption of red meat and tried to eat a more vegan or Mediterranean diet    Past Medical History:     Past Medical History:   Diagnosis Date   . Anemia     PANCYTOPENIA-SEE DR [facility] NOTE IN EPIC 06-25-2015; CBC abnl NOW FOLLOWED BY DR ALI, SYED HGB 9.02 Apr 2016   . Arrhythmia 12/27/2015    AFib - new onset- taking Eliquis FOLLOWED BY [facility]   . Arthritis     BIL TOTAL KNEES   . 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 Carotid stenosis, left 02/09/2017    Bade, Maseer A, MD  NOTES IN EPIC-NO SX NEEDED AT THIS TIME   . Chronic gout     no meds; no symptoms NO RECENT FALRE UP   . 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 Disorder of prostate     BPH -NO MEDS   . GERD (gastroesophageal reflux disease)     TAKES PROTONIX   . Hard to intubate     experienced once after radiation for throat cancer in 2003; subsequent intubations have presented no problems   . Hiatal hernia    . Hyperlipidemia    . Hypertensive disorder     110/60 - 120/70 at recent MD appointments   . Hypothyroidism     NO RECENT MED DOSE CHANGE   . Interstitial lung disease 2013    Oxygen Sat is at 94-95 typically; does not impede activity; no longer uses home O2   . 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 Pulmonary embolus 2013   . Thrombocytopenia DX 2014    PLATELET LEVEL 59  05-12-2017 FOLLOWED  BY DR ALI SYED NOTES IN [facility] 06-16-2017 RE HX OF IT  Past Surgical History:     Past Surgical History:   Procedure Laterality Date   . BONE MARROW BIOPSY  03/2015    FOLLOWED BY DR  ALI SYED HEMATOLOGIST   . COLONOSCOPY N/A 07/09/2017    Procedure: COLONOSCOPY;  Surgeon: Trixie Dredge, MD;  Location: [facility];  Service: Gastroenterology;  Laterality: N/A;   . EGD  2012, 2017   . EGD N/A 08/23/2015    Procedure: EGD;  Surgeon: Trixie Dredge, MD;  Location: [facility];  Service: Gastroenterology;  Laterality: N/A;   . ENDOSTENT, ABDOMINAL AORTIC ANEURYSM (EVAR) N/A 03/04/2016    Procedure: Endostent, Abdominal Aortic Aneurysm (Evar);  Surgeon: Benita Gutter, MD;  Location: [facility] HEART OR;  Service: Vascular;  Laterality: N/A;   . FRACTURE SURGERY  1973    RT TIB/FIB CLOSED REDUCTION   . HERNIA REPAIR Right 2014    INGUINAL    . JOINT REPLACEMENT Left 03/2009    Left TKR,    . JOINT REPLACEMENT Right 2018 MARCH 14    RIGHT TOTAL KNEE   . RT KNEE ACL  1988   .  THORACOSCOPY, (VATS) Right 05/2011    RUL, RLL   . TONSILLECTOMY  1946       Family History:     Family History   Problem Relation Age of Onset   . Aplastic anemia Mother    . Hypertension Father        Social History:     Social History     Social History   . Marital status: Married     Spouse name: N/A   . Number of children: N/A   . Years of education: N/A     Social History Main Topics   . Smoking status: Current Some Day Smoker     Packs/day: 1.50     Years: 20.00     Last attempt to quit: 04/17/1982   . Smokeless tobacco: Never Used      Comment: CIGAR 5-6 TIMES PER YEAR -STILL SKMOKES THEM   . Alcohol use 1.2 - 3.6 oz/week     2 - 6 Shots of liquor per week   . Drug use: No   . Sexual activity: Not on file     Other Topics Concern   . Exposure To Radiation? Yes     Radiation Treatment in 2003   . Exposure To Other Chemicals? No   . Exposure To Asbestos? No     Social History Narrative   . No narrative on file       Allergies:   No Known Allergies    Medications:     Current Outpatient Prescriptions:   .  atorvastatin (LIPITOR) 40 MG tablet, Take 20 mg by mouth every morning.  , Disp: , Rfl:   .  levothyroxine (SYNTHROID, LEVOTHROID) 25 MCG tablet, levothyroxine 25 mcg tablet-MORNING, Disp: , Rfl:   .  metoprolol succinate XL (TOPROL-XL) 25 MG 24 hr tablet, Take 25 mg by mouth every morning.  , Disp: , Rfl:   .  Multiple Vitamin (MULTIVITAMIN) tablet, Take 1 tablet by mouth daily., Disp: , Rfl:   .  pantoprazole (PROTONIX) 40 MG tablet, Take 20 mg by mouth every morning.  , Disp: , Rfl:   .  raNITIdine (ZANTAC) 75 MG tablet, Take 75 mg by mouth nightly., Disp: , Rfl:   .  sodium chloride 0.9 % SOLN 100 mL with azaCITIDine 100 MG SUSR chemo infusion, Infuse  into the vein every 24 hours, Disp: , Rfl:     Review of Systems:   Review of Systems   Constitutional: Negative for appetite change and unexpected weight change.   Eyes: Negative for visual disturbance.        No monocular visual changes   Respiratory:  Negative for shortness of breath.    Cardiovascular: Negative for chest pain.   Gastrointestinal: Negative for abdominal pain.   Genitourinary: Negative for flank pain.   Musculoskeletal: Negative for back pain.        NO unilateral weakness   Skin: Negative for color change and wound.   Neurological: Negative for facial asymmetry, speech difficulty and weakness.   Psychiatric/Behavioral: Negative for confusion.       Physical Exam:     Vitals:    12/07/17 0924 12/07/17 0925 12/07/17 0926   BP: 95/64 100/63 108/72   BP Site: Left arm Right arm Left arm   Patient Position: Sitting Sitting Sitting   Cuff Size: Medium Medium Medium   Pulse: 78     Temp: 97.7 F (36.5 C)     TempSrc: Oral     SpO2: 94%     Weight: 94.3 kg (208 lb)     Height: 1.727 m (5\' 8" )       Body mass index is 31.63 kg/m.    Physical Exam   Constitutional: He is oriented to person, place, and time. He appears well-developed and well-nourished.   HENT:   Head: Normocephalic and atraumatic.   Eyes: Pupils are equal, round, and reactive to light.   Neck: No JVD present. Carotid bruit is present.   Cardiovascular: An irregularly irregular rhythm present. Exam reveals no friction rub.    No murmur heard.  Pulses:       Carotid pulses are on the left side with bruit.       Radial pulses are 2+ on the right side, and 2+ on the left side.   Pulmonary/Chest: Effort normal. No respiratory distress.   Musculoskeletal: Normal range of motion.   Neurological: He is alert and oriented to person, place, and time.   Skin: Skin is warm and dry.   Psychiatric: He has a normal mood and affect. His behavior is normal. Judgment and thought content normal.   Vitals reviewed.    Rads:     Aortic duplex 08/17/17: Conclusions: There is no endo leak visualized. No evidence of stenosis with the graft.  Measurement of the [facility] is 3.2 cm x 2.3 cm.    Carotid duplex 08/11/17: Conclusions: Bilateral internal carotid artery is estimated to be in the <50% range. Bilateral  proximal internal carotid arteries are tortuous.  Evidence of severe hemodynamically significant disease is seen in the left distal common carotid artery. Peak L CCA velocity 392/100 cm/sec.   Antegrade blood flow is present in both vertebral arteries.   Subclavian arteries appear patent bilaterally.     Problem List:     Patient Active Problem List   Diagnosis   . Dyspnea on exertion   . HTN (hypertension)   . Hyperlipidemia   . On home oxygen therapy   . History of pulmonary embolism   . GERD (gastroesophageal reflux disease)   . Anemia due to bone marrow failure   . Thrombocytopenia   . Neutropenia   . Paroxysmal atrial fibrillation       Assessment:   77 y.o. male with:    1. Carotid stenosis, asymptomatic, bilateral  2. AAA (abdominal aortic aneurysm) without rupture    3. History of endovascular stent graft for abdominal aortic aneurysm (AAA)      - The patient is asymptomatic without any recent symptoms of stroke, transient ischemic attack, or amaurosis fugax.  COntinued severe stenosis of the left CCA with peak velocity 392/100 cm/sec.  - Patient currently on atorvastatin for statin therapy.  - EVAR without endoleak or sac enlargement on ultrasound.  Pt denies any symptoms of abdominal pain, chest pain, flank pain, or blue toe syndrome.  - known a-fib, not anticoagulated due to history of myelodysplastic syndrome.    Plan:   - CTA of the neck to re-evaluate L CCA stenosis.  Pt should follow up after study to discuss the results.  - Repeat aortoiliac duplex in 1 year.  - Call office sooner for any symptoms of abdominal pain, chest pain, flank pain, or blue toe syndrome.  Call 911 and proceed to closest ED if symptoms are severe.  - Pt advised of need for ongoing aneurysm surveillance even if asymptomatic.    Return for review of CT results.    It was our pleasure to see your patient in the Vascular Surgery Office today. Please do not hesitate to contact us if you have any questions or concerns.  Thank you  for the opportunity to care for your patient.     Sincerely,   Marcell Anger, M.D.  Warrick Parisian, MSN, FNP-BC  [facility]  [address]  [place] [address]  T [phone] ext [phone]  F [phone]    Orders Placed This Encounter   Procedures   . CT Angiogram Neck     Standing Status:   Future     Standing Expiration Date:   12/08/2018     Order Specific Question:   Clinical info for radiologist     Answer:   known L CCA stenosis   . US Aorta Ivc Iliac Art/Grft Duplex Dopp Comp     Standing Status:   Future     Standing Expiration Date:   06/07/2019     Order Specific Question:   Clinical info for radiologist     Answer:   s/p EVAR evaluate for endoleak, endograft patentcy, and residual aneurysm sac size     I have personally seen and examined this patient with the Nurse Practitioner and have participated in their care. I agree with all clinical information, including the patient history, physical exam, and planning as documented.      Maseer Lutricia Horsfall, MD, FACS, RPVI  Vascular Surgery & Endovascular Intervention  [facility]  Assistant Professor of Vascular Surgery   [facility] [employer]   Tel: [phone] / Fax: [phone]  Cell:  [phone]  [email or web address]

## 2017-12-07 NOTE — Progress Notes (Deleted)
CLINIC PROGRESS NOTE    VASCULAR SURGERY & ENDOVASCULAR INTERVENTION     Date: 12/07/2017  Patient Name: Brent [name]  Attending Physician: Benita Gutter, MD, FACS, RPVI  Chief Complaint: Left lower extremity pain    History of Present Illness:   Brent [name] is a 77 y.o. male who presents to the clinic today with    Past Medical History:     Past Medical History:   Diagnosis Date   . Anemia     PANCYTOPENIA-SEE DR [facility] NOTE IN EPIC 06-25-2015; CBC abnl NOW FOLLOWED BY DR ALI, SYED HGB 9.02 Apr 2016   . Arrhythmia 12/27/2015    AFib - new onset- taking Eliquis FOLLOWED BY [facility]   . Arthritis     BIL TOTAL KNEES   . 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 Carotid stenosis, left 02/09/2017    Rayyan Orsborn A, MD NOTES IN EPIC-NO SX NEEDED AT THIS TIME   . Chronic gout     no meds; no symptoms NO RECENT FALRE UP   . 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 Disorder of prostate     BPH -NO MEDS   . GERD (gastroesophageal reflux disease)     TAKES PROTONIX   . Hard to intubate     experienced once after radiation for throat cancer in 2003; subsequent intubations have presented no problems   . Hiatal hernia    . Hyperlipidemia    . Hypertensive disorder     110/60 - 120/70 at recent MD appointments   . Hypothyroidism     NO RECENT MED DOSE CHANGE   . Interstitial lung disease 2013    Oxygen Sat is at 94-95 typically; does not impede activity; no longer uses home O2   . 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 Pulmonary embolus 2013   . Thrombocytopenia DX 2014    PLATELET LEVEL 59  05-12-2017 FOLLOWED  BY DR ALI SYED NOTES IN [facility] 06-16-2017 RE HX OF IT       Past Surgical History:     Past Surgical History:   Procedure Laterality Date   . BONE MARROW BIOPSY  03/2015    FOLLOWED BY DR  ALI SYED HEMATOLOGIST   . COLONOSCOPY N/A 07/09/2017     Procedure: COLONOSCOPY;  Surgeon: Trixie Dredge, MD;  Location: [facility];  Service: Gastroenterology;  Laterality: N/A;   . EGD  2012, 2017   . EGD N/A 08/23/2015    Procedure: EGD;  Surgeon: Trixie Dredge, MD;  Location: [facility];  Service: Gastroenterology;  Laterality: N/A;   . ENDOSTENT, ABDOMINAL AORTIC ANEURYSM (EVAR) N/A 03/04/2016    Procedure: Endostent, Abdominal Aortic Aneurysm (Evar);  Surgeon: Benita Gutter, MD;  Location: [facility] HEART OR;  Service: Vascular;  Laterality: N/A;   . FRACTURE SURGERY  1973    RT TIB/FIB CLOSED REDUCTION   . HERNIA REPAIR Right 2014    INGUINAL    . JOINT REPLACEMENT Left 03/2009    Left TKR,    . JOINT REPLACEMENT Right 2018 MARCH 14    RIGHT TOTAL KNEE   . RT KNEE ACL  1988   . THORACOSCOPY, (VATS) Right 05/2011  RUL, RLL   . TONSILLECTOMY  1946       Family History:     Family History   Problem Relation Age of Onset   . Aplastic anemia Mother    . Hypertension Father        Social History:   No smoking, EtOH, IVDA.  Lives with ***.  Is a *** by profession.      Allergies:   No Known Allergies    Medications:     Prior to Admission medications    Medication Sig Start Date End Date Taking? Authorizing Provider   atorvastatin (LIPITOR) 40 MG tablet Take 20 mg by mouth every morning.       [provider]   Coenzyme Q10 (CO Q 10 PO) Take 1 capsule by mouth as needed.        [provider]   levothyroxine (SYNTHROID, LEVOTHROID) 25 MCG tablet levothyroxine 25 mcg tablet-MORNING    [provider]   metoprolol succinate XL (TOPROL-XL) 25 MG 24 hr tablet Take 25 mg by mouth every morning.     12/29/16   [provider]   Multiple Vitamin (MULTIVITAMIN) tablet Take 1 tablet by mouth daily.    [provider]   pantoprazole (PROTONIX) 40 MG tablet Take 20 mg by mouth every morning.        [provider]   raNITIdine (ZANTAC) 75 MG tablet Take 75 mg by mouth nightly.    [provider]        Review of Systems:   General: negative for - chills, fever, night sweats  Psychological: negative for - depression, amnesia, suicidal ideation  Ophthalmic: negative for - blurry vision, double vision, blindness  ENT: negative for - epistaxis, deafness, tinnitus, vertigo, dysphagia  Allergy and Immunology: negative for - hives  Hematological and Lymphatic:  negative for - bleeding problems, blood clots, jaundice, swollen lymph nodes  Endocrine:  negative for - palpitations, polydipsia/polyuria  Respiratory: negative for - cough, hemoptysis, shortness of breath, wheezing  Cardiovascular: negative for - chest pain, irregular heartbeat, palpitations  Gastrointestinal: negative for - abdominal pain, early satiety, blood in stools, constipation, diarrhea, hematemesis, melena, nausea/vomiting  Genito-Urinary: negative for - dysuria, hematuria  Musculoskeletal: negative for - joint pain, joint swelling, muscle pain or muscular weakness  Neurological: negative for - TIA or stroke symptoms, dizziness, headaches, memory loss, seizures, paralysis  Dermatological:  negative for lumps, pruritis or rash    Physical Exam:   There were no vitals filed for this visit.    There is no height or weight on file to calculate BMI.    HEENT:  N/C, AT, CN 2-12 intact, no LAD, no TMG, O/P clear, no carotid bruits  CARDIAC:  S1, S2, RRR, no M/G/R  CHEST:  CTA  ABD:  S/NT/ND, no pulsatile abdominal masses, no tenderness, no guarding, no hernias, normal BS, no abdominal or femoral bruits   EXTR:  No C/C/E, no ulcers, no varicose veins  PULSES:    R: 2+ radial, ulnar, brachial, carotid, femoral, popliteal, DP, PT  L:  2+ radial, ulnar, brachial, carotid, femoral, popliteal, DP, PT    Labs:   None    Rads:   Per HPI    Problem List:     Patient Active Problem List   Diagnosis   . Dyspnea on exertion   . HTN (hypertension)   . Hyperlipidemia   . On home oxygen therapy   . History of pulmonary embolism   .  GERD (gastroesophageal reflux  disease)   . Anemia due to bone marrow failure   . Thrombocytopenia   . Neutropenia   . Atrial fibrillation       Assessment:   77 y.o. male ***    Plan:   ***      Thank you for allowing me the privilege of participating in the care of your patient.  Please call me if you have any questions.      Sincerely,    Kirstina Leinweber Lutricia Horsfall, MD, FACS, RPVI  Vascular Surgery, Endovascular Intervention & Vascular Medicine    [facility]  Assistant Professor of Vascular Surgery   [facility] & [employer]  Tel: [phone] / Fax: [phone]  Cell:  [phone]  [email or web address]

## 2017-12-07 NOTE — Progress Notes (Deleted)
So

## 2017-12-08 ENCOUNTER — Ambulatory Visit
Admission: RE | Admit: 2017-12-08 | Discharge: 2017-12-08 | Disposition: A | Discharge status: Home / Self Care | Payer: Medicare Other | Source: Ambulatory Visit | Attending: Internal Medicine | Admitting: Internal Medicine

## 2017-12-08 ENCOUNTER — Ambulatory Visit
Admission: RE | Admit: 2017-12-08 | Discharge: 2017-12-08 | Disposition: A | Discharge status: Home / Self Care | Payer: Medicare Other | Source: Ambulatory Visit | Attending: Family | Admitting: Family

## 2017-12-08 DIAGNOSIS — K4090 Unilateral inguinal hernia, without obstruction or gangrene, not specified as recurrent: Secondary | ICD-10-CM | POA: Insufficient documentation

## 2017-12-08 DIAGNOSIS — Q281 Other malformations of precerebral vessels: Secondary | ICD-10-CM | POA: Insufficient documentation

## 2017-12-08 DIAGNOSIS — R102 Pelvic and perineal pain: Secondary | ICD-10-CM

## 2017-12-08 DIAGNOSIS — I6522 Occlusion and stenosis of left carotid artery: Secondary | ICD-10-CM | POA: Insufficient documentation

## 2017-12-08 DIAGNOSIS — R1030 Lower abdominal pain, unspecified: Secondary | ICD-10-CM

## 2017-12-08 DIAGNOSIS — I6523 Occlusion and stenosis of bilateral carotid arteries: Secondary | ICD-10-CM

## 2017-12-08 LAB — WHOLE BLOOD CREATININE WITH GFR POCT
GFR POCT: >60 mL/min/{1.73_m2} (ref >60)
Whole Blood Creatinine POCT: 0.8 mg/dL (ref 0.5–1.1)

## 2017-12-08 MED ORDER — IODIXANOL 320 MG/ML IV SOLN
100.00 mL | Freq: Once | INTRAVENOUS | Status: AC | PRN
Start: 2017-12-08 — End: 2017-12-08
  Administered 2017-12-08: 17:00:00 100 mL via INTRAVENOUS

## 2017-12-15 ENCOUNTER — Telehealth (INDEPENDENT_AMBULATORY_CARE_PROVIDER_SITE_OTHER): Payer: Self-pay | Admitting: Family

## 2017-12-15 NOTE — Telephone Encounter (Signed)
Patient is followed by Dr. Zollie Pee for a known L CCA stenosis.  Carotid duplex performed in May 2019 - suggested high grade stenosis of the distal L CCA, with no evidence of significant stenosis within the L ICA.     CTA neck performed in the beginning of September, confirms 80-90% stenosis of the distal L CCA. Without evidence of L ICA stenosis.     Patient is asymptomatic. He is scheduled to f/u with Dr. Zollie Pee on 9/30 to review imaging and potentially discuss interventions, if indicated.     However, there is no indication to postpone cataract surgery at this time.

## 2017-12-23 ENCOUNTER — Encounter (INDEPENDENT_AMBULATORY_CARE_PROVIDER_SITE_OTHER): Payer: Self-pay

## 2017-12-23 NOTE — Progress Notes (Signed)
:  Room        VASCULAR SURGERY PATIENT FORM       Appt Date/Time: 4:00 DATE 9:30      PCP: Gerhard Perches, MD  Referring Physician:    New Patient Follow-Up Post-op   [place] [facility] Other Imaging     Chief Complaint/HPI: history of AAA s/p EVAR and carotid stenosis.  Date of Last Visit: 12/07/17   No Known Allergies     Selected Medications:    Coumadin Plavix Eliquis Xarelto Aspirin Fish Oil Lovenox   Insulin Metformin Other Diabetes Atorvastatin  Other Cholesterol    MHx:  Stroke/TIA/Seizures Thyroid  Diabetes   Myocardial Infarction Arrhythmia  COPD/Asthma (other lung)   Kidney Disease Liver Disease DVT   Wounds Musculoskeletal (spine) Cancer             Smoker           Former Smoker          Never Smoker    Vital Signs this Visit Pulses  HT WT HR   Rad Ulnar Brach Fem Pop DP PT       L          BP L BP R SpO2  R                        Imaging results:           Plan of Care:

## 2017-12-28 ENCOUNTER — Encounter (INDEPENDENT_AMBULATORY_CARE_PROVIDER_SITE_OTHER): Payer: Self-pay | Admitting: Specialist

## 2017-12-28 ENCOUNTER — Ambulatory Visit (INDEPENDENT_AMBULATORY_CARE_PROVIDER_SITE_OTHER): Payer: Medicare Other | Admitting: Specialist

## 2017-12-28 ENCOUNTER — Telehealth (INDEPENDENT_AMBULATORY_CARE_PROVIDER_SITE_OTHER): Payer: Self-pay | Admitting: Family

## 2017-12-28 VITALS — BP 109/70 | HR 67 | Temp 98.4°F | Ht 67.5 in | Wt 209.6 lb

## 2017-12-28 DIAGNOSIS — Z01818 Encounter for other preprocedural examination: Secondary | ICD-10-CM

## 2017-12-28 DIAGNOSIS — I6523 Occlusion and stenosis of bilateral carotid arteries: Secondary | ICD-10-CM

## 2017-12-28 DIAGNOSIS — I6522 Occlusion and stenosis of left carotid artery: Secondary | ICD-10-CM

## 2017-12-28 NOTE — H&P (Signed)
OFFICE VISIT  [facility]    Patient Name: Brent [name], Brent [name]  Vascular Surgeon: Marcell Anger, M.D.  Chief Complaint: Follow-up (review CT Angiogram Neck, hx of AAA & bilat carotid stenosis, s/p EVAR 02/2016)      History of Present Illness:   We had the pleasure of seeing [name] in the office for follow up evaluation.  The patient is a 77 y.o. male with a history of AAA s/p EVAR and known carotid stenosis. Carotid duplex in 05/19 demonstrated elevated L CCA velocity of 392/100 cm/sec.  PT was recommended to obtain a CTA of the neck and presents today to discuss those results.   Pt continues follow up with Dr. Karie Mainland for known myelodysplastic syndrome.  He began another treatment regimen today.  He anticipates he may undergo bone marrow transplant for MDS at [facility]  Pt reports Eliquis was restarted after he received a transfusion and blood counts normalized.    Past Medical History:     Past Medical History:   Diagnosis Date   . Anemia     PANCYTOPENIA-SEE DR [facility] NOTE IN EPIC 06-25-2015; CBC abnl NOW FOLLOWED BY DR ALI, SYED HGB 9.02 Apr 2016   . Arrhythmia 12/27/2015    AFib - new onset- taking Eliquis FOLLOWED BY [facility]   . Arthritis     BIL TOTAL KNEES   . 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 Carotid stenosis, left 02/09/2017    Bade, Maseer A, MD NOTES IN EPIC-NO SX NEEDED AT THIS TIME   . Chronic gout     no meds; no symptoms NO RECENT FALRE UP   . 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 Disorder of prostate     BPH -NO MEDS   . GERD (gastroesophageal reflux disease)     TAKES PROTONIX   . Hard to intubate     experienced once after radiation for throat cancer in 2003; subsequent intubations have presented no problems   . Hiatal hernia    . Hyperlipidemia    . Hypertensive disorder     110/60 - 120/70 at recent MD appointments   .  Hypothyroidism     NO RECENT MED DOSE CHANGE   . Interstitial lung disease 2013    Oxygen Sat is at 94-95 typically; does not impede activity; no longer uses home O2   . 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 Pulmonary embolus 2013   . Thrombocytopenia DX 2014    PLATELET LEVEL 59  05-12-2017 FOLLOWED  BY DR ALI SYED NOTES IN [facility] 06-16-2017 RE HX OF IT       Past Surgical History:     Past Surgical History:   Procedure Laterality Date   . BONE MARROW BIOPSY  03/2015    FOLLOWED BY DR  ALI SYED HEMATOLOGIST   . CATARACT EXTRACTION Left    . COLONOSCOPY N/A 07/09/2017    Procedure: COLONOSCOPY;  Surgeon: Trixie Dredge, MD;  [facility];  Service: Gastroenterology;  Laterality: N/A;   . EGD  2012, 2017   . EGD N/A 08/23/2015    Procedure: EGD;  Surgeon: Trixie Dredge, MD;  Location: [facility];  Service: Gastroenterology;  Laterality:  N/A;   . ENDOSTENT, ABDOMINAL AORTIC ANEURYSM (EVAR) N/A 03/04/2016    Procedure: Endostent, Abdominal Aortic Aneurysm (Evar);  Surgeon: Benita Gutter, MD;  Location: [facility] HEART OR;  Service: Vascular;  Laterality: N/A;   . FRACTURE SURGERY  1973    RT TIB/FIB CLOSED REDUCTION   . HERNIA REPAIR Right 2014    INGUINAL    . JOINT REPLACEMENT Left 03/2009    Left TKR,    . JOINT REPLACEMENT Right 2018 MARCH 14    RIGHT TOTAL KNEE   . RT KNEE ACL  1988   . THORACOSCOPY, (VATS) Right 05/2011    RUL, RLL   . TONSILLECTOMY  1946       Family History:     Family History   Problem Relation Age of Onset   . Aplastic anemia Mother    . Hypertension Father        Social History:     Social History     Socioeconomic History   . Marital status: Married     Spouse name: None   . Number of children: None   . Years of education: None   . Highest education level: None   Occupational History   . None   Social Needs   . Financial resource strain: None   . Food insecurity:     Worry: None     Inability: None   . Transportation  needs:     Medical: None     Non-medical: None   Tobacco Use   . Smoking status: Current Some Day Smoker     Packs/day: 1.50     Years: 20.00     Pack years: 30.00     Last attempt to quit: 04/17/1982     Years since quitting: 35.7   . Smokeless tobacco: Never Used   . Tobacco comment: CIGAR 5-6 TIMES PER YEAR -STILL SKMOKES THEM   Substance and Sexual Activity   . Alcohol use: Yes     Alcohol/week: 2.0 - 6.0 standard drinks     Types: 2 - 6 Shots of liquor per week   . Drug use: No   . Sexual activity: None   Lifestyle   . Physical activity:     Days per week: None     Minutes per session: None   . Stress: None   Relationships   . Social connections:     Talks on phone: None     Gets together: None     Attends religious service: None     Active member of club or organization: None     Attends meetings of clubs or organizations: None     Relationship status: None   . Intimate partner violence:     Fear of current or ex partner: None     Emotionally abused: None     Physically abused: None     Forced sexual activity: None   Other Topics Concern   . Exposure to radiation? Yes     Comment: Radiation Treatment in 2003   . Exposure to other chemicals? No   . Exposure to Asbestos? No   Social History Narrative   . None       Allergies:   No Known Allergies    Medications:     Current Outpatient Medications:   .  apixaban (ELIQUIS) 5 MG, Take 5 mg by mouth every 12 (twelve) hours, Disp: , Rfl:   .  atorvastatin (LIPITOR) 40 MG tablet,  Take 80 mg by mouth every morning  , Disp: , Rfl:   .  metoprolol succinate XL (TOPROL-XL) 25 MG 24 hr tablet, Take 25 mg by mouth every morning.  , Disp: , Rfl:   .  Multiple Vitamin (MULTIVITAMIN) tablet, Take 1 tablet by mouth daily., Disp: , Rfl:   .  pantoprazole (PROTONIX) 40 MG tablet, Take 20 mg by mouth every morning.  , Disp: , Rfl:   .  raNITIdine (ZANTAC) 75 MG tablet, Take 75 mg by mouth nightly., Disp: , Rfl:   .  sodium chloride 0.9 % SOLN 100 mL with azaCITIDine 100 MG SUSR  chemo infusion, Infuse into the vein every 24 hours  , Disp: , Rfl:   .  levothyroxine (SYNTHROID, LEVOTHROID) 25 MCG tablet, Take 50 mcg by mouth every 24 hours  , Disp: , Rfl:     Review of Systems:   Review of Systems   Eyes: Negative for visual disturbance.        No monocular visual changes   Respiratory: Negative for shortness of breath.    Cardiovascular: Negative for chest pain.   Musculoskeletal:        NO unilateral weakness   Neurological: Negative for facial asymmetry, speech difficulty and weakness.   Psychiatric/Behavioral: Negative for confusion.     Physical Exam:     Vitals:    12/28/17 1624 12/28/17 1626   BP: 110/68 109/70   BP Site: Left arm Right arm   Patient Position: Sitting Sitting   Cuff Size: Large Large   Pulse: 67 67   Temp: 98.4 F (36.9 C)    TempSrc: Oral    SpO2: 98%    Weight: 95.1 kg (209 lb 9.6 oz)    Height: 1.715 m (5' 7.5")      Body mass index is 32.34 kg/m.    Physical Exam  Vitals signs reviewed.   Constitutional:       Appearance: He is well-developed.   HENT:      Head: Normocephalic and atraumatic.   Eyes:      Pupils: Pupils are equal, round, and reactive to light.   Neck:      Vascular: Carotid bruit present. No JVD.   Cardiovascular:      Rate and Rhythm: Normal rate and regular rhythm.      Pulses:           Carotid pulses are on the left side with bruit.     Heart sounds: No murmur. No friction rub. No gallop.    Pulmonary:      Effort: Pulmonary effort is normal. No respiratory distress.   Musculoskeletal: Normal range of motion.   Skin:     General: Skin is warm and dry.      Capillary Refill: Capillary refill takes less than 2 seconds.   Neurological:      General: No focal deficit present.      Mental Status: He is alert and oriented to person, place, and time.   Psychiatric:         Mood and Affect: Mood normal.         Behavior: Behavior normal.         Thought Content: Thought content normal.         Judgment: Judgment normal.       Rads:     Ct Angiogram  Neck  Result Date: 12/08/2017  HISTORY: Carotid stenosis. COMPARISON: Carotid ultrasound 08/11/2017.   TECHNIQUE: A CTA  of the neck extending up from the inferior neck through the level of the sella was performed after the intravenous administration of 100cc Visipaque 320 contrast. The CTA examination consists of contrast-enhanced axial source images as well as post processed multiplanar 2-D and 3-D images obtained on an independent workstation. This CT study was performed using radiation dose reduction techniques including one or more of the following: automated exposure control, adjustment of the mA and/or kV according to patient size, and the use of iterative reconstruction technique. Any proximal internal carotid artery narrowing was determined utilizing the distal internal carotid artery as a reference, similar to NASCET methodology. FINDINGS: The brachiocephalic and subclavian arteries are without significant stenosis. There is mild-moderate smooth stenosis involving the proximal half of the left common carotid artery (CCA). There is a shorter segment of moderate-severe stenosis measuring approximately 2.1 cm in length involving the distal half of the left CCA, stenosis measuring up to 80-90%. The distal most aspect of the left CCA appears widely patent. There is no significant cervical left internal carotid artery (ICA) stenosis. The right CCA an cervical right ICA are without significant stenosis. The left vertebral artery is dominant and widely patent. 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 There is multilevel cervical spine disc space narrowing and spondylosis comprised of diffuse disc bulge/osteophyte complexes and facet arthrosis resulting in varying degrees of neural foraminal stenosis bilaterally.      Moderate-severe stenosis involving the distal half of the left CCA as above. No  significant cervical ICA stenosis is identified. 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 Leandro Reasoner, MD 12/08/2017 4:53 PM  Dr. Zollie Pee reviewed images.  Technically limited study.  Difficult to determine location and degree of stenosis    Problem List:     Patient Active Problem List   Diagnosis   . Dyspnea on exertion   . HTN (hypertension)   . Hyperlipidemia   . On home oxygen therapy   . History of pulmonary embolism   . GERD (gastroesophageal reflux disease)   . Anemia due to bone marrow failure   . Thrombocytopenia   . Neutropenia   . Paroxysmal atrial fibrillation   . Carotid stenosis, non-symptomatic, left       Assessment:   77 y.o. male with:    1. Carotid stenosis, non-symptomatic, left    2. Pre-op evaluation      - Pt with left common carotid stenosis by ultrasound.  Dr. Zollie Pee reviewed images of recent CT. Study is technically limited and it is difficult to determine location and degree of stenosis.    Plan:   - Carotid angiogram to determine location and degree of stenosis.  - CEA discussed with pt by Dr. Zollie Pee at today's visit.  The indications, benefits and risks have been explained to the patient at today's visit.  Risks including but not limited to bleeding, infection, nerve damage, damage to adjacent structures, need for adjunctive procedures, lack of signs and symptoms resolution, inability to complete procedure, retention of foreign bodies, heart attack, multisystem organ failure, stroke and death were explained. No guarantees to surgical outcome were given or implied. The patient demonstrated understanding, and acceptance of the risks, and consented to proceed forth with the procedure.  All of the patient's questions were answered to the patient's satisfaction prior to the close of today's visit.      Return for pt to schedule surgery, office will  call.    It was our pleasure to see your patient in the Vascular Surgery Office today. Please do not hesitate to contact us if you  have any questions or concerns.  Thank you for the opportunity to care for your patient.     Sincerely,   Marcell Anger, M.D.  Warrick Parisian, MSN, FNP-BC  [facility]  [address]  [place] [address]  T [phone] ext [phone]  F [phone]    Orders Placed This Encounter   Procedures   . Basic Metabolic Panel     Pre-Op     Standing Status:   Future     Standing Expiration Date:   06/28/2018     Order Specific Question:   Has the patient fasted?     Answer:   No   . CBC and differential     Pre-Op     Standing Status:   Future     Standing Expiration Date:   06/28/2018       This note was generated by the Epic EMR system/ Dragon speech recognition and may contain inherent errors or omissions not intended by the user. Grammatical errors, random word insertions, deletions, pronoun errors and incomplete sentences are occasional consequences of this technology due to software limitations. Not all errors are caught or corrected. If there are questions or concerns about the content of this note or information contained within the body of this dictation they should be addressed directly with the author for clarification

## 2017-12-28 NOTE — Progress Notes (Signed)
OFFICE VISIT  [facility]    Patient Name: Brent [name],Brent [name]  Vascular Surgeon: Maseer Bade, M.D.  Chief Complaint: Follow-up (review CT Angiogram Neck, hx of AAA & bilat carotid stenosis, s/p EVAR 02/2016)      History of Present Illness:   We had the pleasure of seeing [name] in the office for follow up evaluation.  The patient is a 77 y.o. male with a history of AAA s/p EVAR and known carotid stenosis. Carotid duplex in 05/19 demonstrated elevated L CCA velocity of 392/100 cm/sec.  PT was recommended to obtain a CTA of the neck and presents today to discuss those results.   Pt continues follow up with Dr. Ali for known myelodysplastic syndrome.  He began another treatment regimen today.  He anticipates he may undergo bone marrow transplant for MDS at [facility]  Pt reports Eliquis was restarted after he received a transfusion and blood counts normalized.    Past Medical History:     Past Medical History:   Diagnosis Date   . Anemia     PANCYTOPENIA-SEE DR KHAN'S NOTE IN EPIC 06-25-2015; CBC abnl NOW FOLLOWED BY DR ALI, SYED HGB 9.02 Apr 2016   . Arrhythmia 12/27/2015    AFib - new onset- taking Eliquis FOLLOWED BY [facility]   . Arthritis     BIL TOTAL KNEES   . 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 Carotid stenosis, left 02/09/2017    Bade, Maseer A, MD NOTES IN EPIC-NO SX NEEDED AT THIS TIME   . Chronic gout     no meds; no symptoms NO RECENT FALRE UP   . 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 Disorder of prostate     BPH -NO MEDS   . GERD (gastroesophageal reflux disease)     TAKES PROTONIX   . Hard to intubate     experienced once after radiation for throat cancer in 2003; subsequent intubations have presented no problems   . Hiatal hernia    . Hyperlipidemia    . Hypertensive disorder     110/60 - 120/70 at recent MD appointments   .  Hypothyroidism     NO RECENT MED DOSE CHANGE   . Interstitial lung disease 2013    Oxygen Sat is at 94-95 typically; does not impede activity; no longer uses home O2   . 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 Pulmonary embolus 2013   . Thrombocytopenia DX 2014    PLATELET LEVEL 59  05-12-2017 FOLLOWED  BY DR ALI SYED NOTES IN EPIC 06-16-2017 RE HX OF IT       Past Surgical History:     Past Surgical History:   Procedure Laterality Date   . BONE MARROW BIOPSY  03/2015    FOLLOWED BY DR  ALI SYED HEMATOLOGIST   . CATARACT EXTRACTION Left    . COLONOSCOPY N/A 07/09/2017    Procedure: COLONOSCOPY;  Surgeon: Wasan, Sanjeev M, MD;  Location: [facility];  Service: Gastroenterology;  Laterality: N/A;   . EGD  2012, 2017   . EGD N/A 08/23/2015    Procedure: EGD;  Surgeon: Wasan, Sanjeev M, MD;  Location: [facility];  Service: Gastroenterology;  Laterality:   N/A;   . ENDOSTENT, ABDOMINAL AORTIC ANEURYSM (EVAR) N/A 03/04/2016    Procedure: Endostent, Abdominal Aortic Aneurysm (Evar);  Surgeon: Bade, Maseer A, MD;  Location: [facility];  Service: Vascular;  Laterality: N/A;   . FRACTURE SURGERY  1973    RT TIB/FIB CLOSED REDUCTION   . HERNIA REPAIR Right 2014    INGUINAL    . JOINT REPLACEMENT Left 03/2009    Left TKR,    . JOINT REPLACEMENT Right 2018 MARCH 14    RIGHT TOTAL KNEE   . RT KNEE ACL  1988   . THORACOSCOPY, (VATS) Right 05/2011    RUL, RLL   . TONSILLECTOMY  1946       Family History:     Family History   Problem Relation Age of Onset   . Aplastic anemia Mother    . Hypertension Father        Social History:     Social History     Socioeconomic History   . Marital status: Married     Spouse name: None   . Number of children: None   . Years of education: None   . Highest education level: None   Occupational History   . None   Social Needs   . Financial resource strain: None   . Food insecurity:     Worry: None     Inability: None   . Transportation  needs:     Medical: None     Non-medical: None   Tobacco Use   . Smoking status: Current Some Day Smoker     Packs/day: 1.50     Years: 20.00     Pack years: 30.00     Last attempt to quit: 04/17/1982     Years since quitting: 35.7   . Smokeless tobacco: Never Used   . Tobacco comment: CIGAR 5-6 TIMES PER YEAR -STILL SKMOKES THEM   Substance and Sexual Activity   . Alcohol use: Yes     Alcohol/week: 2.0 - 6.0 standard drinks     Types: 2 - 6 Shots of liquor per week   . Drug use: No   . Sexual activity: None   Lifestyle   . Physical activity:     Days per week: None     Minutes per session: None   . Stress: None   Relationships   . Social connections:     Talks on phone: None     Gets together: None     Attends religious service: None     Active member of club or organization: None     Attends meetings of clubs or organizations: None     Relationship status: None   . Intimate partner violence:     Fear of current or ex partner: None     Emotionally abused: None     Physically abused: None     Forced sexual activity: None   Other Topics Concern   . Exposure to radiation? Yes     Comment: Radiation Treatment in 2003   . Exposure to other chemicals? No   . Exposure to Asbestos? No   Social History Narrative   . None       Allergies:   No Known Allergies    Medications:     Current Outpatient Medications:   .  apixaban (ELIQUIS) 5 MG, Take 5 mg by mouth every 12 (twelve) hours, Disp: , Rfl:   .  atorvastatin (LIPITOR) 40 MG tablet,   Take 80 mg by mouth every morning  , Disp: , Rfl:   .  metoprolol succinate XL (TOPROL-XL) 25 MG 24 hr tablet, Take 25 mg by mouth every morning.  , Disp: , Rfl:   .  Multiple Vitamin (MULTIVITAMIN) tablet, Take 1 tablet by mouth daily., Disp: , Rfl:   .  pantoprazole (PROTONIX) 40 MG tablet, Take 20 mg by mouth every morning.  , Disp: , Rfl:   .  raNITIdine (ZANTAC) 75 MG tablet, Take 75 mg by mouth nightly., Disp: , Rfl:   .  sodium chloride 0.9 % SOLN 100 mL with azaCITIDine 100 MG SUSR  chemo infusion, Infuse into the vein every 24 hours  , Disp: , Rfl:   .  levothyroxine (SYNTHROID, LEVOTHROID) 25 MCG tablet, Take 50 mcg by mouth every 24 hours  , Disp: , Rfl:     Review of Systems:   Review of Systems   Eyes: Negative for visual disturbance.        No monocular visual changes   Respiratory: Negative for shortness of breath.    Cardiovascular: Negative for chest pain.   Musculoskeletal:        NO unilateral weakness   Neurological: Negative for facial asymmetry, speech difficulty and weakness.   Psychiatric/Behavioral: Negative for confusion.     Physical Exam:     Vitals:    12/28/17 1624 12/28/17 1626   BP: 110/68 109/70   BP Site: Left arm Right arm   Patient Position: Sitting Sitting   Cuff Size: Large Large   Pulse: 67 67   Temp: 98.4 F (36.9 C)    TempSrc: Oral    SpO2: 98%    Weight: 95.1 kg (209 lb 9.6 oz)    Height: 1.715 m (5' 7.5")      Body mass index is 32.34 kg/m.    Physical Exam  Vitals signs reviewed.   Constitutional:       Appearance: He is well-developed.   HENT:      Head: Normocephalic and atraumatic.   Eyes:      Pupils: Pupils are equal, round, and reactive to light.   Neck:      Vascular: Carotid bruit present. No JVD.   Cardiovascular:      Rate and Rhythm: Normal rate and regular rhythm.      Pulses:           Carotid pulses are on the left side with bruit.     Heart sounds: No murmur. No friction rub. No gallop.    Pulmonary:      Effort: Pulmonary effort is normal. No respiratory distress.   Musculoskeletal: Normal range of motion.   Skin:     General: Skin is warm and dry.      Capillary Refill: Capillary refill takes less than 2 seconds.   Neurological:      General: No focal deficit present.      Mental Status: He is alert and oriented to person, place, and time.   Psychiatric:         Mood and Affect: Mood normal.         Behavior: Behavior normal.         Thought Content: Thought content normal.         Judgment: Judgment normal.       Rads:     Ct Angiogram  Neck  Result Date: 12/08/2017  HISTORY: Carotid stenosis. COMPARISON: Carotid ultrasound 08/11/2017.   TECHNIQUE: A CTA   of the neck extending up from the inferior neck through the level of the sella was performed after the intravenous administration of 100cc Visipaque 320 contrast. The CTA examination consists of contrast-enhanced axial source images as well as post processed multiplanar 2-D and 3-D images obtained on an independent workstation. This CT study was performed using radiation dose reduction techniques including one or more of the following: automated exposure control, adjustment of the mA and/or kV according to patient size, and the use of iterative reconstruction technique. Any proximal internal carotid artery narrowing was determined utilizing the distal internal carotid artery as a reference, similar to NASCET methodology. FINDINGS: The brachiocephalic and subclavian arteries are without significant stenosis. There is mild-moderate smooth stenosis involving the proximal half of the left common carotid artery (CCA). There is a shorter segment of moderate-severe stenosis measuring approximately 2.1 cm in length involving the distal half of the left CCA, stenosis measuring up to 80-90%. The distal most aspect of the left CCA appears widely patent. There is no significant cervical left internal carotid artery (ICA) stenosis. The right CCA an cervical right ICA are without significant stenosis. The left vertebral artery is dominant and widely patent. 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 There is multilevel cervical spine disc space narrowing and spondylosis comprised of diffuse disc bulge/osteophyte complexes and facet arthrosis resulting in varying degrees of neural foraminal stenosis bilaterally.      Moderate-severe stenosis involving the distal half of the left CCA as above. No  significant cervical ICA stenosis is identified. 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 Philip  Minshew, MD 12/08/2017 4:53 PM  Dr. Bade reviewed images.  Technically limited study.  Difficult to determine location and degree of stenosis    Problem List:     Patient Active Problem List   Diagnosis   . Dyspnea on exertion   . HTN (hypertension)   . Hyperlipidemia   . On home oxygen therapy   . History of pulmonary embolism   . GERD (gastroesophageal reflux disease)   . Anemia due to bone marrow failure   . Thrombocytopenia   . Neutropenia   . Paroxysmal atrial fibrillation   . Carotid stenosis, non-symptomatic, left       Assessment:   76 y.o. male with:    1. Carotid stenosis, non-symptomatic, left    2. Pre-op evaluation      - Pt with left common carotid stenosis by ultrasound.  Dr. Bade reviewed images of recent CT. Study is technically limited and it is difficult to determine location and degree of stenosis.    Plan:   - Carotid angiogram to determine location and degree of stenosis.  - CEA discussed with pt by Dr. Bade at today's visit.  The indications, benefits and risks have been explained to the patient at today's visit.  Risks including but not limited to bleeding, infection, nerve damage, damage to adjacent structures, need for adjunctive procedures, lack of signs and symptoms resolution, inability to complete procedure, retention of foreign bodies, heart attack, multisystem organ failure, stroke and death were explained. No guarantees to surgical outcome were given or implied. The patient demonstrated understanding, and acceptance of the risks, and consented to proceed forth with the procedure.  All of the patient's questions were answered to the patient's satisfaction prior to the close of today's visit.      Return for pt to schedule surgery, office will   call.    It was our pleasure to see your patient in the Vascular Surgery Office today. Please do not hesitate to contact us if you  have any questions or concerns.  Thank you for the opportunity to care for your patient.     Sincerely,   Maseer Bade, M.D.  Maddon Horton, MSN, FNP-BC  [facility]  [address]  [place] [address]  T [phone] ext [phone]  F [phone]    Orders Placed This Encounter   Procedures   . Basic Metabolic Panel     Pre-Op     Standing Status:   Future     Standing Expiration Date:   06/28/2018     Order Specific Question:   Has the patient fasted?     Answer:   No   . CBC and differential     Pre-Op     Standing Status:   Future     Standing Expiration Date:   06/28/2018       This note was generated by the Epic EMR system/ Dragon speech recognition and may contain inherent errors or omissions not intended by the user. Grammatical errors, random word insertions, deletions, pronoun errors and incomplete sentences are occasional consequences of this technology due to software limitations. Not all errors are caught or corrected. If there are questions or concerns about the content of this note or information contained within the body of this dictation they should be addressed directly with the author for clarification

## 2017-12-28 NOTE — Telephone Encounter (Signed)
Please call patient to schedule:    Surgeon: Marcell Anger, M.D.  Procedure: left carotid angiogram  DX: Left common carotid stenosis  Anesthesia: Choice   Location: Cath Lab  Clearances required: No   Pre-op testing:   CBC and BMP   Rx entered into Epic.  Blood thinners:   apixaban/Eliquis   Hold 3 days prior to procedure and Aspirin   Continue  (pt may not be taking ASA prior to angio depending on what his oncologist recommends)  Allergies:  No Known Allergies  Contrast Allergy: No  Known renal insufficiency: No  Special instructions:   - None

## 2017-12-29 ENCOUNTER — Encounter (INDEPENDENT_AMBULATORY_CARE_PROVIDER_SITE_OTHER): Payer: Self-pay | Admitting: Specialist

## 2017-12-31 NOTE — Telephone Encounter (Signed)
Called and left message for patient to call back and schedule surgery.

## 2018-01-04 ENCOUNTER — Encounter (INDEPENDENT_AMBULATORY_CARE_PROVIDER_SITE_OTHER): Payer: Self-pay | Admitting: Specialist

## 2018-01-04 NOTE — Telephone Encounter (Signed)
Patient scheduled for:    Surgeon: Marcell Anger, M.D.  Procedure: left carotid angiogram on 10/28  Arrive at: 100 pm for 300 pm case  Anesthesia: Choice   Location: Cath Lab  Special instructions:   NPO after 700 am with light breakfast  Knows to have Labs done-  Need medication instructions    Mailing out packet

## 2018-01-04 NOTE — Telephone Encounter (Signed)
Medication instructions sent to patient with surgery information packet:    Pre-Procedure Medication Instructions:  - Eliquis: this must be held for 48 hours prior to your surgery. The last day you should take this is October 25th.         Day of Surgery Medication Instructions:  - You must have nothing to eat or drink from 7:00 am onwards the morning of surgery. A light breakfast (i.e. cereal or toast) is okay before 7:00am.  - Do not take the following medications the morning of surgery:   - Vitamins or supplements    - You can take the following medications on your normal schedule. Anything taken in the evening can be taken as normal the night before your procedure. Anything taken in the morning can be taken the morning of your surgery with small sips of water only.   - Atorvastatin   - Levothyroxine   - Metoprolol    - Pantoprazole   - Ranitidie    Pre-Op Testing:  - CBC, BMP: orders in EPIC

## 2018-01-04 NOTE — Telephone Encounter (Signed)
Called and left second message to call back to schedule surgery.

## 2018-01-21 NOTE — Telephone Encounter (Signed)
-   Labs outstanding

## 2018-01-22 NOTE — Pre-Procedure Instructions (Signed)
   Called Dr. Meade Maw office for labs, was put on hold for several minutes that went past 1630, then tried to call again, but office closed and closed over weekend.  email sent to Dr. Denyse Amass RN with this info.

## 2018-01-22 NOTE — Pre-Procedure Instructions (Signed)
   Procedure Verified Carotid angio 01/25/2018 @ 1500   Pt ID verified   Per MD office, NPO p [id number] reinforced , w read back   Reinforced medication instructions by MD, may take morning meds w sips of water with read back   Arrival time at 1300  w read back, parking at [employer], check in at CATH/EP lab registration, [facility] grd flr with read back   Ride arranged with wife   Pt instructed to bring medication list   States labs done last week at Dr Meade Maw office " some time last week"; no results noted in [facility]

## 2018-01-22 NOTE — Telephone Encounter (Signed)
-   Labs not received; DOS CBC, BMP orders placed in Epic along with cath orders.

## 2018-01-25 ENCOUNTER — Encounter
Admission: RE | Disposition: A | Discharge status: Home / Self Care | Payer: Self-pay | Source: Ambulatory Visit | Attending: Specialist

## 2018-01-25 ENCOUNTER — Ambulatory Visit
Admission: RE | Admit: 2018-01-25 | Discharge: 2018-01-25 | Disposition: A | Discharge status: Home / Self Care | Payer: Medicare Other | Source: Ambulatory Visit | Attending: Specialist | Admitting: Specialist

## 2018-01-25 DIAGNOSIS — I6522 Occlusion and stenosis of left carotid artery: Secondary | ICD-10-CM | POA: Insufficient documentation

## 2018-01-25 DIAGNOSIS — I6529 Occlusion and stenosis of unspecified carotid artery: Secondary | ICD-10-CM | POA: Diagnosis present

## 2018-01-25 HISTORY — PX: ARTERIAL-  CAROTID ANGIOGRAPHY: VAS2099

## 2018-01-25 LAB — CBC
Absolute NRBC: 0 10*3/uL (ref 0.00–0.00)
Hematocrit: 28.8 % — ABNORMAL LOW (ref 37.6–49.6)
Hgb: 8.6 g/dL — ABNORMAL LOW (ref 12.5–17.1)
MCH: 27.8 pg (ref 25.1–33.5)
MCHC: 29.9 g/dL — ABNORMAL LOW (ref 31.5–35.8)
MCV: 93.2 fL (ref 78.0–96.0)
MPV: 10.5 fL (ref 8.9–12.5)
Nucleated RBC: 0 /100 WBC (ref 0.0–0.0)
Platelets: 246 10*3/uL (ref 142–346)
RBC: 3.09 10*6/uL — ABNORMAL LOW (ref 4.20–5.90)
RDW: 18 % — ABNORMAL HIGH (ref 11–15)
WBC: 2.76 10*3/uL — ABNORMAL LOW (ref 3.10–9.50)

## 2018-01-25 LAB — BASIC METABOLIC PANEL
BUN: 24 mg/dL (ref 9.0–28.0)
CO2: 26 mEq/L (ref 22–29)
Calcium: 9.1 mg/dL (ref 7.9–10.2)
Chloride: 103 mEq/L (ref 100–111)
Creatinine: 0.9 mg/dL (ref 0.7–1.3)
Glucose: 90 mg/dL (ref 70–100)
Potassium: 4.9 mEq/L (ref 3.5–5.1)
Sodium: 139 mEq/L (ref 136–145)

## 2018-01-25 LAB — GFR: EGFR: >60

## 2018-01-25 SURGERY — ARTERIAL-  CAROTID ANGIOGRAPHY
Anesthesia: Conscious Sedation | Laterality: Left

## 2018-01-25 MED ORDER — IODIXANOL 320 MG/ML IV SOLN
50.00 mL | Freq: Once | INTRAVENOUS | Status: AC | PRN
Start: 2018-01-25 — End: 2018-01-25
  Administered 2018-01-25: 17:00:00 50 mL via INTRA_ARTERIAL

## 2018-01-25 MED ORDER — ASPIRIN 81 MG PO TBEC
81.00 mg | DELAYED_RELEASE_TABLET | Freq: Every day | ORAL | 3 refills | Status: DC
Start: 2018-01-25 — End: 2019-02-13

## 2018-01-25 MED ORDER — HEPARIN (PORCINE) IN NACL 2-0.9 UNIT/ML-% IJ SOLN (WRAP)
INTRAVENOUS | Status: AC
Start: 2018-01-25 — End: 2018-01-25
  Filled 2018-01-25: qty 2000

## 2018-01-25 MED ORDER — ASPIRIN 81 MG PO TBEC
81.00 mg | DELAYED_RELEASE_TABLET | Freq: Every day | ORAL | 3 refills | Status: DC
Start: 2018-01-25 — End: 2018-01-25

## 2018-01-25 MED ORDER — HEPARIN (PORCINE) IN NACL 2-0.9 UNIT/ML-% IJ SOLN (WRAP)
INTRAVENOUS | Status: AC
Start: 2018-01-25 — End: 2018-01-25
  Filled 2018-01-25: qty 1000

## 2018-01-25 MED ORDER — FENTANYL CITRATE (PF) 50 MCG/ML IJ SOLN (WRAP)
INTRAMUSCULAR | Status: AC
Start: 2018-01-25 — End: 2018-01-25
  Administered 2018-01-25: 16:00:00 50 ug via INTRAVENOUS
  Filled 2018-01-25: qty 2

## 2018-01-25 MED ORDER — MIDAZOLAM HCL 2 MG/2ML IJ SOLN
INTRAMUSCULAR | Status: AC
Start: 2018-01-25 — End: 2018-01-25
  Administered 2018-01-25: 16:00:00 2 mg via INTRAVENOUS
  Filled 2018-01-25: qty 2

## 2018-01-25 MED ORDER — SODIUM CHLORIDE 0.9 % IV SOLN
INTRAVENOUS | Status: DC
Start: 2018-01-25 — End: 2018-01-25

## 2018-01-25 MED ORDER — HEPARIN SODIUM (PORCINE) 1000 UNIT/ML IJ SOLN
INTRAMUSCULAR | Status: AC
Start: 2018-01-25 — End: 2018-01-25
  Administered 2018-01-25: 16:00:00 5000 [IU] via INTRAVENOUS
  Filled 2018-01-25: qty 10

## 2018-01-25 MED ORDER — LIDOCAINE HCL (PF) 1 % IJ SOLN
INTRAMUSCULAR | Status: AC
Start: 2018-01-25 — End: 2018-01-25
  Administered 2018-01-25: 16:00:00 3 mL via SUBCUTANEOUS
  Filled 2018-01-25: qty 30

## 2018-01-25 MED ORDER — LIDOCAINE HCL (PF) 1 % IJ SOLN
INTRAMUSCULAR | Status: DC
Start: 2018-01-25 — End: 2018-01-25
  Filled 2018-01-25: qty 30

## 2018-01-25 MED ORDER — MIDAZOLAM HCL 2 MG/2ML IJ SOLN
INTRAMUSCULAR | Status: AC
Start: 2018-01-25 — End: 2018-01-25
  Administered 2018-01-25: 16:00:00 1 mg via INTRAVENOUS
  Filled 2018-01-25: qty 2

## 2018-01-25 MED ORDER — HEPARIN SODIUM (PORCINE) 1000 UNIT/ML IJ SOLN
INTRAMUSCULAR | Status: DC
Start: 2018-01-25 — End: 2018-01-25
  Filled 2018-01-25: qty 10

## 2018-01-25 NOTE — Discharge Instr - AVS First Page (Addendum)
Reason for your Hospital Admission:  Carotid artery stenosis      Instructions for after your discharge:  1.  Call Dr. Denyse Amass office for 1 month f/u appt.  Call [phone].  2.  If angioplasty or stent was performed, request arterial ultrasound of treated leg and ABIs of both legs with the f/u office visit appointment.  3.  If groin bleeds, swells, or leg swells with severe pain, bleeds, turns cold, blue, or mottled, call Dr. Denyse Amass office.  4.  Tylenol as needed for pain.   5.  No exercise, sex, lifting > 20# x 2 weeks.    6.  May remove dressing, shower, get wound wet, dry, and leave open to air after 2 days.    7.  Resume all home meds.  8.  May drive and return to work after 2 days.      Interventional Cardiovascular Admission and Recovery        Interventional Radiology  Discharge Instructions for Groin Access Angiogram / Angiography    Your procedure was performed on 01/25/2018 by Dr. Zollie Pee.    Closure Device:  Mynx     Access Site: Right Femoral Artery      Activity:  1. No driving for 24 hours following the procedure due to medications you may have received.   2. Rest today and tomorrow, gradually increasing to your usual activities.  3. Limit stair usage for the next 24 hours. If you must use the stairs, take them one at a time, leading with your unaffected leg holding pressure on the bandaged site.  4. Do not lift anything over 10 pounds in weight for five (5) days. That includes pushing, pulling, dragging or moving anything.  5. No strenuous activity for five (5) days. Do not attempt anything that may cause fatigue, shortness of breath, perspiration or chest pain.   6. Support the bandaged site when coughing or sneezing. Do not strain when having a bowel movement.   7. Drink 6-8 glasses of water for at least 48 hours to help flush your body of the dye used during your procedure.      Access Site Care:  1. No tub baths, hot tubs, pools or sitting in water for one week.  2. You may shower 24 hours after  your procedure. Leave the bandage in place and let the water passively flow over the site.  3. REMOVE the dressing 48 hours after your procedure, either before or during your shower. Again, let the water passively flow over the site, wash gently with your hand, then pat the area dry.   4. Do not rub, pick or scratch the area.   5. Do not apply creams, powders, lotions, or ointments to the site.   6. Apply a regular sized Band-Aid to the puncture site and change it daily for five (5) days. You may shower daily.  7. Observe for signs of infection:  redness, warmth, swelling, drainage, or temperature greater than 100 degrees F.  If you suspect infection call the doctor who performed the procedure.  8. If you develop a rash, contact the Interventional Radiologist.      Normal Observations:  1. You may feel tenderness.  2. You may experience some mild bruising.  3. If a closure device was used, you may feel a small lump, about the size of an olive pit, which should disappear within 90 days.    Call 911 if:  1. You are experiencing unrelieved chest, arm, or neck  pain.  2. You notice bleeding either through the dressing or underneath the skin. If the blood is trapped under the skin, the area will hurt, become swollen and hard. If either happens, lay down flat and hold pressure on the site. This is an arterial bleed, and may become an emergency if unattended.   3. Your leg becomes cold, numb, painful, pale or grayish in color, or change from usual color/sensation.    If you have questions or concerns, please call an Interventional Radiologist:    Contact Numbers:    Regular business hours (8A-5P M-F):  [facility]:  [phone] option 3  After hours:  Answering service:  [phone]

## 2018-01-25 NOTE — Progress Notes (Signed)
CATH LAB PROCEDURE HANDOFF REPORT    Date Time: 01/25/18 4:45 PM    INDICATIONS:    Carotid artery disease  PROCEDURE:    Carotid angio unil,Aortogram arch angio  ALLERGIES:    Patient has no known allergies.   ACC BLEEDING RISK SCORE       MEDICAL HISTORY:      Past Medical History:   Diagnosis Date   . Anemia     PANCYTOPENIA-SEE DR [facility] NOTE IN EPIC 06-25-2015; CBC abnl NOW FOLLOWED BY DR ALI, SYED HGB 9.02 Apr 2016   . Arrhythmia 12/27/2015    AFib - new onset- taking Eliquis FOLLOWED BY [facility]   . Arthritis     BIL TOTAL KNEES   . 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 Chronic gout     no meds; no symptoms NO RECENT FALRE UP   . 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 Disorder of prostate     BPH -NO MEDS   . Encounter for blood transfusion 11/2017    for MDS syndrome,  no reaction   . GERD (gastroesophageal reflux disease)     controlled with rx   . Hard to intubate     experienced once after radiation for throat cancer in 2003; subsequent intubations have presented no problems   . Hiatal hernia    . Hyperlipidemia     on rx   . Hypertensive disorder     110/60 - 120/70 at recent MD appointments   . Hypothyroidism     on Rx   . Interstitial lung disease 2013    Oxygen Sat is at 94-95 typically; does not impede activity; no longer uses home O2   . 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 MDS (myelodysplastic syndrome)    . Pulmonary embolus 2013   . Thrombocytopenia DX 2014    PLATELET LEVEL 59  05-12-2017 FOLLOWED  BY DR ALI SYED NOTES IN [facility] 06-16-2017 RE HX OF IT      ACCESS:    38F, 33F sheath in right femoral artery  Hemostasis: Mynx  Post procedure pulses: palpable in right leg/foot  Visual appearance: clean/dry/intact with good distal pulses   MEDICATIONS:    Versed: 3 mg IV  Fentanyl: 50 mcg IV  Heparin:  5000 units IV  IV Drips:  VITALS:    BP:  105/60          HR:58         Rhythm: sinus rhythm and sinus bradycardia                O2 SAT: 94%        PROCEDURE DETAILS:    Outcomes: diagnostic cath                                       Last ACT: 180    Complications: none    Final Chest Pain Assessment::0/10    Report given to: Tonye Pearson    See Physicians Op note/ Report for details

## 2018-01-25 NOTE — Progress Notes (Signed)
No change in H & P from 12/28/17.  Plan L carotid angiogram to determine degree and location of L carotid stenosis as CTA was technically limited.  The indications, benefits, and risks of the procedure were explained to the patient in the presence of the nurse/resident/family.  Alternative therapies including non-operative, conservative therapies were discussed with the patient.  Risks including but not limited to bleeding, infection, groin puncture complication including pseudoaneurysm, embolization, vessel perforation, ischemia, retained foreign bodies, anaphylaxis, nerve damage, damage to adjacent structures, renal failure, need for adjunctive procedures, need for open surgical conversion, lack of resolution of signs and symptoms, inability to complete the procedure, limb loss, stroke, heart attack, multisystem organ failure, and death were explained to the patient. No guarantees to surgical outcome were given or implied. The patient demonstrated understanding, and acceptance of the risks, and consented to proceed forth with the procedure.  I answered all of the patient's questions to the patient's satisfaction.    Blandina Renaldo Lutricia Horsfall, MD, FACS, RPVI  Vascular Surgery & Endovascular Intervention  [facility]  Assistant Professor of Vascular Surgery   [facility] [employer]   Tel: [phone] / Fax: [phone]  Cell:  [phone]  [email or web address]

## 2018-01-25 NOTE — Discharge Summary -  Nursing (Signed)
Pt discharged home with family. Discharge instructions discussed in length with patient. All questions answered. Pt ambulated and voided without difficulty. Right groin dressing remains CDI, soft, no hematoma or oozing. Pt escorted to car via wheelchair for safe discharge with family .

## 2018-01-25 NOTE — UM Notes (Signed)
UTILIZATION REVIEW CONTACT: Name: Alfonso Patten RN, MSN, CCM, ACM  Clinical Case Manager  - Utilization Review  [facility]  Address:  [address] [place], [place]  [address]  NPI:   [id number]  Tax ID:  [id number]  Phone: [phone]  Fax: [phone]  Email: [email or web address]    Please use fax number [phone] to provide authorization for hospital services or to request additional information.        PATIENT NAME: Brent [name], Brent [name]   DOB: 05-22-1940   PMH:  has a past medical history of Anemia, Arrhythmia (12/27/2015), Arthritis, Cancer (2004), Chronic gout, Complication of anesthesia, Disorder of prostate, Encounter for blood transfusion (11/2017), GERD (gastroesophageal reflux disease), Hard to intubate, Hiatal hernia, Hyperlipidemia, Hypertensive disorder, Hypothyroidism, Interstitial lung disease (2013), Malignant neoplasm of skin (2016), MDS (myelodysplastic syndrome), Pulmonary embolus (2013), and Thrombocytopenia (DX 2014).  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     01/25/18 1355  Place in Outpatient/Ambulatory Status          Admission date: 01/25/2018  Pt is a 77 y.o. male who arrived at [facility] for elective surgery/procedure.    PREOPERATIVE DIAGNOSIS: Left common carotid artery stenosis    POSTOPERATIVE DIAGNOSIS: Left common carotid artery stenosis 63%    SURGERY: Right femoral percutaneous access ultrasound-guided, catheter in aorta, arch aortogram, left common carotid angiogram, right iliofemoral angiogram, right femoral minx closure, radiologic  supervision interpretation    FINDINGS: Left common carotid artery stenosis 63%    COMPLICATIONS: None    CONDITION: Stable    Visit Vitals  BP 112/58   Pulse (!) 55   Temp 97.2 F (36.2 C) (Oral)   Resp 16   Ht 1.727 m (5\' 8" )   Wt 93 kg (205 lb)   SpO2 99%   BMI 31.17 kg/m     No intake/output data recorded.    Medications:   Current Facility-Administered Medications   Medication Dose Route Frequency     . sodium chloride 100 mL/hr at 01/25/18 1458        Primary Payor: MEDICARE / Plan: MEDICARE PART A AND B / Product Type: Medicare /   NOTES TO REVIEWER:    This clinical review is based on/compiled from documentation provided by the treatment team within the patient's medical record.

## 2018-01-25 NOTE — Brief Op Note (Signed)
BRIEF OP NOTE    Date Time: 01/25/18 4:32 PM    Patient Name:   Brent [name]    Date of Operation:   01/25/2018    Providers Performing:   Surgeon(s) and Role:     * Bade, Maseer A, MD - Primary    Operative Procedure:   Procedure(s):  1. US guided access of the right CFA  2. Aortogram  3. Selective catheterization of the left common carotid artery  4. Carotid angiogram  5. Radiologic interpretation of findings  6. Closure of puncture site with Mynx closure device    Preoperative Diagnosis:   Pre-Op Diagnosis Codes:     * Occlusion of left carotid artery [I65.22]    Postoperative Diagnosis:   Carotid artery stenosis, asymptomatic     Anesthesia:   Conscious Sedation    Fluids (I/O):   EBL:  Minimal  Contrast: 35cc  Radiation: 71.62mG y  Fluoro Time: 5.3 min    Implants:   * No implants in log *    Drains:   None    Specimens:   None    Findings:   Normal aortic arch. Aneursymal innominate artery. Left common carotid artery stenosis measured at 63% and smooth tapered appearance. ICA without stenosis    Wound Class:   Clean    Complications:   None    Signed by: Donald Siva CARDIAC CATH

## 2018-01-25 NOTE — Progress Notes (Signed)
Received patient into ICAR room 6 to pre-op for carotid angioplasty. Patient ID, allergies, and procedure verified. NPO status confirmed. Reviewed PTA medications and medical/surgical history. Alert and oriented x4. Patient ready for procedure. Oriented to room, call light within reach, bed in lowest position. Reviewed fall precautions and when to call for help; patient verbalizes understanding. Family at bedside.    Pre-Angio Teaching and Air cabin crew: patient   Preference for learning: Verbal  Teaching Method: Verbal Instruction   Outcome of Learning: verbalized understanding     Described/Demonstrated the following:      + Responsibilities of patient's care  + Purpose of procedure  + Need to be NPO pre-procedure  + Need for maintaining bedrest & straight leg post-procedure & sheath removal.  + Necessary fluid intake after procedure  + Symptoms of bleeding & states plan to notify nurse.

## 2018-01-25 NOTE — Op Note (Signed)
ENDOVASCULAR PROCEDURE OPERATIVE NOTE      DATE OF SURGERY: January 17, 2018    PREOPERATIVE DIAGNOSIS: Left common carotid artery stenosis    POSTOPERATIVE DIAGNOSIS: Left common carotid artery stenosis 63%    SURGERY: Right femoral percutaneous access ultrasound-guided, catheter in aorta, arch aortogram, left common carotid angiogram, right iliofemoral angiogram, right femoral minx closure, radiologic supervision interpretation    SURGEON:  Adynn Caseres Lutricia Horsfall, MD    ASSISTANT SURGEON: Tressie Stalker, MD    ANESTHESIA: Local and IV sedation    FINDINGS: Left common carotid artery stenosis 63%    COMPLICATIONS: None    CONDITION: Stable    CONTRAST: 35 cc    FLOURO TIME: 5.3 min    AIR KERMA: 71.62 mGy    DOSE AREA PRODUCT: 10.5 Gycm2      INDICATIONS FOR PROCEDURE: The patient is a 77 year old white male upon whom I performed an endovascular AAA repair percutaneously on in the past.  He had no evidence of endoleak.  He had a left carotid stenosis of the common carotid artery that appeared to be 70 to 99% on duplex scan imaging but it was difficult to tell on CT angiogram with the true degree of carotid stenosis and therefore I decided to do a carotid angiogram as the gold standard study to determine the true degree of carotid stenosis and determine whether or not the patient required carotid intervention for primary stroke prevention.  The indications, benefits, and risks of the procedure were explained to the patient in the presence of the nurse/resident/family.  Alternative therapies including non-operative, conservative therapies were discussed with the patient.  Risks including but not limited to bleeding, infection, groin puncture complication including pseudoaneurysm, embolization, vessel perforation, ischemia, retained foreign bodies, anaphylaxis, nerve damage, damage to adjacent structures, renal failure, need for adjunctive procedures, need for open surgical conversion, lack of resolution of signs and  symptoms, inability to complete the procedure, limb loss, stroke, heart attack, multisystem organ failure, and death were explained to the patient. No guarantees to surgical outcome were given or implied. The patient demonstrated understanding, and acceptance of the risks, and consented to proceed forth with the procedure.  I answered all of the patient's questions to the patient's satisfaction.    DESCRIPTION OF PROCEDURE: After informed consent was obtained the patient was taken to the cardiac catheterization lab and placed in the supine position.  Bilateral groins were shaved prepped and draped in the standard sterile fashion.  The appropriate timeout was instituted.  Intravenous sedation was provided with Versed and fentanyl.  Under duplex scan guidance the right common femoral artery was retrograde percutaneously accessed with a micropuncture needle wire and sheath.  Over a J-wire the sheath was upsized to a short 5 [place] sheath.  Over an angled Glidewire 110 cm 5 French pigtail catheter was planted into the ascending aorta and a 30 degree LAO arch aortogram was performed.  The patient was administered 5000 units of intravenous heparin and after circulation time of 3 minutes and under smart mask guidance the left common carotid artery was catheterized with 110 cm 4 French angled glide catheter and the wire Glidewire followed suit and the catheter was planted into the common carotid artery.  An AP and lateral carotid arteriogram was performed indicating a widely patent albeit dilated proximal internal carotid artery that was tortuous and a patent left external carotid artery.  Utilizing a glow and tell tape a calculation was performed to determine the degree of left common carotid  artery stenosis and the calculation resulted in 63%.  No operative intervention would be necessary for this and optimal optimal medical therapy will be instituted including aspirin statin blood pressure control weight loss and dietary  modification and exercise.  Follow-up duplex scan imaging would be performed in 48-month intervals to evaluate for any progression of occlusive disease.  The catheter was straightened over wire and removed in a retrograde sheath a gram from the right femoral sheath indicated a good puncture in the common femoral artery above but near the femoral bifurcation.  A 5 French minx closure was performed of the femoral arteriotomy.  Please note that the arch aortogram indicated a widely patent innominate right subclavian right common carotid left common carotid save the aforementioned and left subclavian and left vertebral arteries.  The left vertebral artery was extremely large and dominant.  The right vertebral artery was unable to be visualized.    The patient emerged from anesthesia uneventfully and was transported back to the recovery room in a stable hemodynamic and neurologic state with no immediate complications noted.  Please note that I as primary operative surgeon was present for the case in its entirety including its critical portions.    Latoria Dry Lutricia Horsfall, MD, FACS, RPVI  Vascular Surgery & Endovascular Intervention  [facility]  Assistant Professor of Vascular Surgery   [facility]   Tel: [phone] / Fax: [phone]  Cell:  [phone]  [email or web address]    This note was generated by the Epic EMR system/ Dragon speech recognition and may contain inherent errors or omissions not intended by the user. Grammatical errors, random word insertions, deletions, pronoun errors and incomplete sentences are occasional consequences of this technology due to software limitations. Not all errors are caught or corrected. If there are questions or concerns about the content of this note or information contained within the body of this dictation they should be addressed directly with the author for clarification

## 2018-01-25 NOTE — Progress Notes (Signed)
Received report and patient into ICAR room 16 s/p Rt carotid angio. Assumed patient's care. ID band verified, alert and oriented x 3, VSS, pain 0 /10, Sinus rhythm on tele. Dressing on right groin CDI, site soft no hematoma or oozing noted. Family member called at bedside, diet started. Call bell within reach.     Described/Demonstrated the following:   + Need for maintaining bedrest & straight leg post-procedure & sheath removal  + Method for applying pressure to cath site when coughing, laughing & voiding post-procedure

## 2018-01-25 NOTE — Progress Notes (Signed)
ASA & MALAMPATTI    Date Time: 01/25/18 12:33 PM      PROCEDURALIST COMMENTS BELOW:         INDICATIONS:   L CCA stenosis      REVIEW OF SYSTEMS:   YES  ( X )         ALLERGIES:     NO  ( x )   YES  (  )        PHYSICAL EXAM     AIRWAY CLASSIFICATION:    CLASS I   (  )     CLASS II  (  )    CLASS III  ( x )     CLASS IV  (  )    CARDIAC :   ( X )  RRR  (  )  IRREG  (  )  MURMUR      LUNGS:   ( X )  CLEAR  (  )  DIMINISHED    (  ) LEFT   (  )  RIGHT  (  )  ABSENT          (  ) LEFT   (  )  RIGHT  (  )  TUBES            (  ) LEFT   (  )  RIGHT          ABDOMEN:   S/NT/ND      NEURO:   MAES      OTHER:   NONE    LABS:     Lab Results   Component Value Date/Time    WBC 1.92 (L) 07/09/2017 09:24 AM    HCT 31.3 (L) 07/09/2017 09:24 AM    PLT 59 (L) 07/09/2017 09:24 AM    PLT 180 10/15/2010 07:45 PM    INR 1.3 (H) 02/28/2016 05:30 PM    PT 16.5 (H) 02/28/2016 05:30 PM    PTT 32 12/27/2015 03:45 PM    BUN 19.0 03/04/2016 02:33 PM    GLU 113 (H) 03/04/2016 02:33 PM    K 4.1 03/04/2016 02:33 PM           ASA PHYSICAL STATUS   (  )  ASA 1   HEALTHY PATIENT  (  )  ASA 2   MILD SYSTEMIC ILLNESS  ( x )  ASA 3   SYSTEMIC DISEASE, NOT INCAPACITATING  (  )  ASA 4   SEVERE SYSTEMIC DISEASE, DISEASE IS CONSTANT THREAT TO                         LIFE  (  )  ASA 5   MORIBUND CONDITION, NOT EXPECTED TO LIVE >24 HOURS            IRRESPECTIVE OF PROCEDURE  (  )  E           EMERGENCY PROCEDURE       PLANNED SEDATION:   (  ) NO SEDATION  ( X ) MODERATE SEDATION  (  ) DEEP SEDATION WITH ANESTHESIA      CONCLUSION:   PATIENT HAS BEEN REASSESSED IMMEDIATELY PRIOR TO THE PROCEDURE   AND IS AN APPROPRIATE CANDIDATE FOR THE PLANNED SEDATION AND   PROCEDURE.  RISKS, BENEFITS AND ALTERNATIVES TO THE PLANNED   PROCEDURE AND SEDATION HAVE BEEN EXPLAINED TO THE PATIENT   OR GUARDIAN.    ( X )  YES  (  )  EMERGENCY CONSENT     Sonji Starkes Thyra Breed, MD, FACS, RPVI  Vascular Surgery, Endovascular Intervention & Vascular Medicine    [facility]  Assistant Professor of Vascular [place]   Tel: [phone] / Fax: [phone]  Cell:  [phone]  [email or web address]

## 2018-01-28 ENCOUNTER — Encounter: Payer: Self-pay | Admitting: Specialist

## 2018-02-05 ENCOUNTER — Encounter (INDEPENDENT_AMBULATORY_CARE_PROVIDER_SITE_OTHER): Payer: Self-pay

## 2018-02-05 NOTE — Progress Notes (Signed)
:  Room       VASCULAR SURGERY PATIENT FORM       Appt Date/Time: 02/08/18 @ 11:15am   PCP: Gerhard Perches, MD  Referring Physician:    New Patient Follow-Up Post-op   [place] [facility] [place] [facility] Other Imaging     Chief Complaint/HPI:  6wk f/u for Non-Symptomatic Lt Carotid Stenosis. Arterial-Carotid Angiography on 01/25/18.  Date of Last Visit: 12/28/17   No Known Allergies     Selected Medications:    Coumadin Plavix Eliquis Xarelto Aspirin Fish Oil Lovenox   Insulin Metformin Other Diabetes Atorvastatin  Other Cholesterol    MHx:  Stroke/TIA/Seizures Thyroid  Diabetes   Myocardial Infarction Arrhythmia  COPD/Asthma (other lung)   Kidney Disease Liver Disease DVT   Wounds Musculoskeletal (spine) Cancer             Smoker      x    Former Smoker          Never Smoker    Vital Signs this Visit Pulses  HT WT HR   Rad Ulnar Brach Fem Pop DP PT       L          BP L BP R SpO2  R                        Imaging results:           Plan of Care:

## 2018-02-08 ENCOUNTER — Encounter (INDEPENDENT_AMBULATORY_CARE_PROVIDER_SITE_OTHER): Payer: Self-pay | Admitting: Specialist

## 2018-02-08 ENCOUNTER — Ambulatory Visit (INDEPENDENT_AMBULATORY_CARE_PROVIDER_SITE_OTHER): Payer: Medicare Other | Admitting: Specialist

## 2018-02-08 VITALS — BP 105/60 | HR 74 | Temp 98.7°F | Ht 68.0 in | Wt 198.0 lb

## 2018-02-08 DIAGNOSIS — I6523 Occlusion and stenosis of bilateral carotid arteries: Secondary | ICD-10-CM

## 2018-02-08 DIAGNOSIS — Z95828 Presence of other vascular implants and grafts: Secondary | ICD-10-CM | POA: Insufficient documentation

## 2018-02-08 DIAGNOSIS — I714 Abdominal aortic aneurysm, without rupture, unspecified: Secondary | ICD-10-CM

## 2018-02-08 NOTE — Progress Notes (Signed)
OFFICE VISIT  [facility] VASCULAR    Patient Name: Brent [name], Brent [name]  Vascular Surgeon: Marcell Anger, M.D.  Chief Complaint: Other      History of Present Illness:   We had the pleasure of seeing Brent [name] in the office for follow up evaluation.  The patient is a 77 y.o. male who presents to follow up on carotid angiogram done 01/25/18.  Carotid angio demonstrated Left common carotid stenosis 63%.  NO surgical intervention was recommended and patient was recommended to continue medical therapy including Aspirin, statin, blood pressure control, weight loss, dietary modification, and exercise.  Pt doing well post angio with no complications  He is continuing workup for possible bone marrow transplant at [facility] for MDS.  He continues to follow up with Dr. Karie Mainland.  Pt currently taking ASA 81 mg, aotrvastatin 40 mg, and eliquis 5 mg BID.  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    Past Medical History:     Past Medical History:   Diagnosis Date   . Anemia     PANCYTOPENIA-SEE DR [facility] NOTE IN EPIC 06-25-2015; CBC abnl NOW FOLLOWED BY DR ALI, SYED HGB 9.02 Apr 2016   . Arrhythmia 12/27/2015    AFib - new onset- taking Eliquis FOLLOWED BY [facility]   . Arthritis     BIL TOTAL KNEES   . 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 Chronic gout     no meds; no symptoms NO RECENT FALRE UP   . 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 Disorder of prostate     BPH -NO MEDS   . Encounter for blood transfusion 11/2017    for MDS syndrome,  no reaction   . GERD (gastroesophageal reflux disease)     controlled with rx   . Hard to intubate     experienced once after radiation for throat cancer in 2003; subsequent intubations have presented no problems   . Hiatal hernia    . Hyperlipidemia     on rx   . Hypertensive disorder     110/60 - 120/70 at recent MD appointments   . Hypothyroidism     on Rx   .  Interstitial lung disease 2013    Oxygen Sat is at 94-95 typically; does not impede activity; no longer uses home O2   . 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 MDS (myelodysplastic syndrome)    . Pulmonary embolus 2013   . Thrombocytopenia DX 2014    PLATELET LEVEL 59  05-12-2017 FOLLOWED  BY DR ALI SYED NOTES IN [facility] 06-16-2017 RE HX OF IT       Past Surgical History:     Past Surgical History:   Procedure Laterality Date   . ARTERIAL-  CAROTID ANGIOGRAPHY Left 01/25/2018    Procedure: ARTERIAL-  CAROTID ANGIOGRAPHY;  Surgeon: Benita Gutter, MD;  Location: [facility];  Service: Cardiovascular;  Laterality: Left;   . BONE MARROW BIOPSY  03/2015    FOLLOWED BY DR  ALI SYED HEMATOLOGIST   . CATARACT EXTRACTION Left    . COLONOSCOPY N/A 07/09/2017    Procedure: COLONOSCOPY;  Surgeon: Trixie Dredge, MD;  [facility];  Service: Gastroenterology;  Laterality: N/A;   . EGD  2012, 2017   . EGD N/A 08/23/2015    Procedure: EGD;  Surgeon: Trixie Dredge, MD;  Location: [facility];  Service: Gastroenterology;  Laterality: N/A;   . ENDOSTENT, ABDOMINAL AORTIC ANEURYSM (EVAR) N/A 03/04/2016    Procedure: Endostent, Abdominal Aortic Aneurysm (Evar);  Surgeon: Benita Gutter, MD;  Location: [facility] HEART OR;  Service: Vascular;  Laterality: N/A;   . FRACTURE SURGERY  1973    RT TIB/FIB CLOSED REDUCTION   . HERNIA REPAIR Right 2014    INGUINAL    . JOINT REPLACEMENT Left 03/2009    Left TKR,    . JOINT REPLACEMENT Right 2018 MARCH 14    RIGHT TOTAL KNEE   . RT KNEE ACL  1988   . THORACOSCOPY, (VATS) Right 05/2011    RUL, RLL   . TONSILLECTOMY  1946       Family History:     Family History   Problem Relation Age of Onset   . Aplastic anemia Mother    . Hypertension Father        Social History:     Social History     Socioeconomic History   . Marital status: Married     Spouse name: None   . Number of children: None   . Years of education: None   .  Highest education level: None   Occupational History   . None   Social Needs   . Financial resource strain: None   . Food insecurity:     Worry: None     Inability: None   . Transportation needs:     Medical: None     Non-medical: None   Tobacco Use   . Smoking status: Former Smoker     Packs/day: 1.50     Years: 20.00     Pack years: 30.00     Last attempt to quit: 04/17/1982     Years since quitting: 35.8   . Smokeless tobacco: Never Used   . Tobacco comment: CIGAR 5-6 TIMES PER YEAR -STILL SKMOKES THEM   Substance and Sexual Activity   . Alcohol use: Yes     Alcohol/week: 2.0 standard drinks     Types: 2 Shots of liquor per week   . Drug use: No   . Sexual activity: None   Lifestyle   . Physical activity:     Days per week: None     Minutes per session: None   . Stress: None   Relationships   . Social connections:     Talks on phone: None     Gets together: None     Attends religious service: None     Active member of club or organization: None     Attends meetings of clubs or organizations: None     Relationship status: None   . Intimate partner violence:     Fear of current or ex partner: None     Emotionally abused: None     Physically abused: None     Forced sexual activity: None   Other Topics Concern   . Exposure to radiation? Yes     Comment: Radiation Treatment in 2003   . Exposure to other chemicals? No   . Exposure to Asbestos? No   Social History Narrative   . None       Allergies:   No Known Allergies    Medications:     Current Outpatient  Medications:   .  apixaban (ELIQUIS) 5 MG, Take 5 mg by mouth every 12 (twelve) hours, Disp: , Rfl:   .  aspirin EC 81 MG EC tablet, Take 1 tablet (81 mg total) by mouth daily, Disp: 30 tablet, Rfl: 3  .  atorvastatin (LIPITOR) 40 MG tablet, Take 80 mg by mouth every morning  , Disp: , Rfl:   .  azaCITIDine (VIDAZA IJ), Inject as directed 1 injection for 7 days and 21 days off; will restart on 01/25/2018 MDS syndrome, Disp: , Rfl:   .  levothyroxine (SYNTHROID,  LEVOTHROID) 25 MCG tablet, Take 50 mcg by mouth Once a day at 6:00am  , Disp: , Rfl:   .  metoprolol succinate XL (TOPROL-XL) 25 MG 24 hr tablet, Take 25 mg by mouth every morning.  , Disp: , Rfl:   .  Multiple Vitamin (MULTIVITAMIN) tablet, Take 1 tablet by mouth every morning  , Disp: , Rfl:   .  pantoprazole (PROTONIX) 40 MG tablet, Take 20 mg by mouth every morning.  , Disp: , Rfl:   .  raNITIdine (ZANTAC) 75 MG tablet, Take 75 mg by mouth nightly., Disp: , Rfl:     Review of Systems:   Review of Systems   Eyes: Negative for visual disturbance.        No monocular visual changes   Respiratory: Negative for shortness of breath.    Cardiovascular: Negative for chest pain.   Musculoskeletal:        NO unilateral weakness   Neurological: Negative for facial asymmetry, speech difficulty and weakness.   Psychiatric/Behavioral: Negative for confusion.     Physical Exam:     Vitals:    02/08/18 1120 02/08/18 1121   BP: 108/60 105/60   BP Site: Left arm Right arm   Patient Position: Sitting Sitting   Cuff Size: Medium Medium   Pulse: 74    Temp: 98.7 F (37.1 C)    TempSrc: Oral    SpO2: 97%    Weight: 89.8 kg (198 lb)    Height: 1.727 m (5\' 8" )      Body mass index is 30.11 kg/m.    Physical Exam  Vitals signs reviewed.   Constitutional:       Appearance: He is well-developed.   HENT:      Head: Normocephalic and atraumatic.   Eyes:      Pupils: Pupils are equal, round, and reactive to light.   Neck:      Vascular: No JVD.   Pulmonary:      Effort: Pulmonary effort is normal. No respiratory distress.   Musculoskeletal: Normal range of motion.   Skin:     General: Skin is warm and dry.   Neurological:      General: No focal deficit present.      Mental Status: He is alert and oriented to person, place, and time. Mental status is at baseline.      Gait: Gait normal.   Psychiatric:         Mood and Affect: Mood normal.         Behavior: Behavior normal.         Thought Content: Thought content normal.         Judgment:  Judgment normal.         Rads:     DATE OF SURGERY: January 17, 2018    PREOPERATIVE DIAGNOSIS: Left common carotid artery stenosis    POSTOPERATIVE DIAGNOSIS: Left common  carotid artery stenosis 63%    SURGERY: Right femoral percutaneous access ultrasound-guided, catheter in aorta, arch aortogram, left common carotid angiogram, right iliofemoral angiogram, right femoral minx closure, radiologic supervision interpretation    SURGEON:  Maseer Lutricia Horsfall, MD    ASSISTANT SURGEON: Tressie Stalker, MD    ANESTHESIA: Local and IV sedation    FINDINGS: Left common carotid artery stenosis 63%     DESCRIPTION OF PROCEDURE: After informed consent was obtained the patient was taken to the cardiac catheterization lab and placed in the supine position.  Bilateral groins were shaved prepped and draped in the standard sterile fashion.  The appropriate timeout was instituted.  Intravenous sedation was provided with Versed and fentanyl.  Under duplex scan guidance the right common femoral artery was retrograde percutaneously accessed with a micropuncture needle wire and sheath.  Over a J-wire the sheath was upsized to a short 5 [place] sheath.  Over an angled Glidewire 110 cm 5 French pigtail catheter was planted into the ascending aorta and a 30 degree LAO arch aortogram was performed.  The patient was administered 5000 units of intravenous heparin and after circulation time of 3 minutes and under smart mask guidance the left common carotid artery was catheterized with 110 cm 4 French angled glide catheter and the wire Glidewire followed suit and the catheter was planted into the common carotid artery.  An AP and lateral carotid arteriogram was performed indicating a widely patent albeit dilated proximal internal carotid artery that was tortuous and a patent left external carotid artery.  Utilizing a glow and tell tape a calculation was performed to determine the degree of left common carotid artery stenosis and the calculation  resulted in 63%.  No operative intervention would be necessary for this and optimal optimal medical therapy will be instituted including aspirin statin blood pressure control weight loss and dietary modification and exercise.  Follow-up duplex scan imaging would be performed in 46-month intervals to evaluate for any progression of occlusive disease.  The catheter was straightened over wire and removed in a retrograde sheath a gram from the right femoral sheath indicated a good puncture in the common femoral artery above but near the femoral bifurcation.  A 5 French minx closure was performed of the femoral arteriotomy.  Please note that the arch aortogram indicated a widely patent innominate right subclavian right common carotid left common carotid save the aforementioned and left subclavian and left vertebral arteries.  The left vertebral artery was extremely large and dominant.  The right vertebral artery was unable to be visualized.      Problem List:     Patient Active Problem List   Diagnosis   . Dyspnea on exertion   . HTN (hypertension)   . Hyperlipidemia   . On home oxygen therapy   . History of pulmonary embolism   . GERD (gastroesophageal reflux disease)   . Anemia due to bone marrow failure   . Thrombocytopenia   . Neutropenia   . Paroxysmal atrial fibrillation   . Abdominal aortic aneurysm (AAA) without rupture   . Carotid stenosis, non-symptomatic, left   . Carotid stenosis   . History of endovascular stent graft for abdominal aortic aneurysm       Assessment:   77 y.o. male with:    1. Bilateral carotid artery stenosis    2. Abdominal aortic aneurysm (AAA) without rupture    3. History of endovascular stent graft for abdominal aortic aneurysm      - Carotid  angio demonstrated Left common carotid stenosis 63%.  NO surgical intervention was recommended and patient was recommended to continue medical therapy including aspirin, statin, blood pressure control, weight loss, dietary modification, and  exercise.    - Known AAA s/p EVAR due for routine follow up 11/2018    Plan:   - Follow up for repeat carotid duplex in conjunction with planned aortic duplex in 11/2018.  - Continue antiplatelet and statin therapy for medical management of carotid artery disease.    - Call 911 and proceed directly to the closest emergency department for any s/s of lateralizing neurologic ischemia, stroke, TIA, or amaurosis fugax.  -Gust diet modification including recommendation for moderate Mediterranean diet for vascular health.    Return in about 10 months (around 12/10/2018) for carotid duplex, aortoiliac duplex.    It was our pleasure to see your patient in the Vascular Surgery Office today. Please do not hesitate to contact us if you have any questions or concerns.  Thank you for the opportunity to care for your patient.     Sincerely,   Marcell Anger, M.D.  Warrick Parisian, MSN, FNP-BC  [facility]  [address]  [place] [address]  T [phone]  F [phone]      I performed a face to face clinical assessment of the patient including history and physical exam with pertinent findings noted. Additional time was spent reviewing existing diagnostic studies and constructing a diagnostic and treatment plan. Greater than 50% of the time was spent face to face in coordination and counseling regarding implementation of the plan which was discussed in detail with the patient including risks and benefits. Based on the complexity of the case, 20 minutes were spent in this process      Orders Placed This Encounter   Procedures   . US Carotid Duplex Dopp Comp Bilateral     Standing Status:   Future     Standing Expiration Date:   08/09/2019     Order Specific Question:   Clinical info for radiologist     Answer:   known carotid stenosis       This note was generated by the Epic EMR system/ Dragon speech recognition and may contain inherent errors or omissions not intended by the user. Grammatical  errors, random word insertions, deletions, pronoun errors and incomplete sentences are occasional consequences of this technology due to software limitations. Not all errors are caught or corrected. If there are questions or concerns about the content of this note or information contained within the body of this dictation they should be addressed directly with the author for clarification

## 2018-02-09 ENCOUNTER — Encounter (INDEPENDENT_AMBULATORY_CARE_PROVIDER_SITE_OTHER): Payer: Self-pay | Admitting: Specialist

## 2018-02-10 ENCOUNTER — Encounter (INDEPENDENT_AMBULATORY_CARE_PROVIDER_SITE_OTHER): Payer: Self-pay | Admitting: Specialist

## 2018-04-24 ENCOUNTER — Other Ambulatory Visit: Payer: Self-pay | Admitting: Psychiatry

## 2018-10-30 DIAGNOSIS — Z9481 Bone marrow transplant status: Secondary | ICD-10-CM

## 2018-10-30 HISTORY — DX: Bone marrow transplant status: Z94.81

## 2019-02-13 ENCOUNTER — Emergency Department
Admission: EM | Admit: 2019-02-13 | Discharge: 2019-02-13 | Disposition: A | Discharge status: Home / Self Care | Payer: Medicare Other | Attending: Emergency Medicine | Admitting: Emergency Medicine

## 2019-02-13 ENCOUNTER — Emergency Department: Payer: Medicare Other

## 2019-02-13 DIAGNOSIS — E785 Hyperlipidemia, unspecified: Secondary | ICD-10-CM | POA: Insufficient documentation

## 2019-02-13 DIAGNOSIS — J189 Pneumonia, unspecified organism: Secondary | ICD-10-CM | POA: Insufficient documentation

## 2019-02-13 DIAGNOSIS — Z20828 Contact with and (suspected) exposure to other viral communicable diseases: Secondary | ICD-10-CM | POA: Insufficient documentation

## 2019-02-13 DIAGNOSIS — D709 Neutropenia, unspecified: Secondary | ICD-10-CM | POA: Insufficient documentation

## 2019-02-13 DIAGNOSIS — E7800 Pure hypercholesterolemia, unspecified: Secondary | ICD-10-CM | POA: Insufficient documentation

## 2019-02-13 DIAGNOSIS — Z7982 Long term (current) use of aspirin: Secondary | ICD-10-CM | POA: Insufficient documentation

## 2019-02-13 DIAGNOSIS — Z9481 Bone marrow transplant status: Secondary | ICD-10-CM | POA: Insufficient documentation

## 2019-02-13 DIAGNOSIS — D696 Thrombocytopenia, unspecified: Secondary | ICD-10-CM | POA: Insufficient documentation

## 2019-02-13 DIAGNOSIS — I4891 Unspecified atrial fibrillation: Secondary | ICD-10-CM | POA: Insufficient documentation

## 2019-02-13 DIAGNOSIS — I10 Essential (primary) hypertension: Secondary | ICD-10-CM | POA: Insufficient documentation

## 2019-02-13 DIAGNOSIS — E039 Hypothyroidism, unspecified: Secondary | ICD-10-CM | POA: Insufficient documentation

## 2019-02-13 DIAGNOSIS — Z7901 Long term (current) use of anticoagulants: Secondary | ICD-10-CM | POA: Insufficient documentation

## 2019-02-13 DIAGNOSIS — Z86711 Personal history of pulmonary embolism: Secondary | ICD-10-CM | POA: Insufficient documentation

## 2019-02-13 DIAGNOSIS — E860 Dehydration: Secondary | ICD-10-CM | POA: Insufficient documentation

## 2019-02-13 DIAGNOSIS — R55 Syncope and collapse: Secondary | ICD-10-CM | POA: Insufficient documentation

## 2019-02-13 LAB — COMPREHENSIVE METABOLIC PANEL
ALT: 46 U/L (ref 0–55)
AST (SGOT): 42 U/L — ABNORMAL HIGH (ref 5–34)
Albumin/Globulin Ratio: 1.7 (ref 0.9–2.2)
Albumin: 3.8 g/dL (ref 3.5–5.0)
Alkaline Phosphatase: 79 U/L (ref 38–106)
Anion Gap: 10 (ref 5.0–15.0)
BUN: 14.8 mg/dL (ref 9.0–28.0)
Bilirubin, Total: 0.6 mg/dL (ref 0.2–1.2)
CO2: 22 mEq/L (ref 22–29)
Calcium: 7.9 mg/dL (ref 7.9–10.2)
Chloride: 103 mEq/L (ref 100–111)
Creatinine: 1.1 mg/dL (ref 0.7–1.3)
Globulin: 2.2 g/dL (ref 2.0–3.6)
Glucose: 117 mg/dL — ABNORMAL HIGH (ref 70–100)
Potassium: 3.7 mEq/L (ref 3.5–5.1)
Protein, Total: 6 g/dL (ref 6.0–8.3)
Sodium: 135 mEq/L — ABNORMAL LOW (ref 136–145)

## 2019-02-13 LAB — URINALYSIS REFLEX TO MICROSCOPIC EXAM - REFLEX TO CULTURE
Bilirubin, UA: NEGATIVE
Blood, UA: NEGATIVE
Glucose, UA: NEGATIVE
Ketones UA: NEGATIVE
Leukocyte Esterase, UA: NEGATIVE
Nitrite, UA: NEGATIVE
Protein, UR: 30 — AB
Specific Gravity UA: 1.035 (ref 1.001–1.035)
Urine pH: 7 (ref 5.0–8.0)
Urobilinogen, UA: NORMAL mg/dL (ref 0.2–2.0)

## 2019-02-13 LAB — CBC AND DIFFERENTIAL
Absolute NRBC: 0 10*3/uL (ref 0.00–0.00)
Hematocrit: 47.5 % (ref 37.6–49.6)
Hgb: 16.2 g/dL (ref 12.5–17.1)
MCH: 34.2 pg — ABNORMAL HIGH (ref 25.1–33.5)
MCHC: 34.1 g/dL (ref 31.5–35.8)
MCV: 100.4 fL — ABNORMAL HIGH (ref 78.0–96.0)
MPV: 9.6 fL (ref 8.9–12.5)
Nucleated RBC: 0 /100 WBC (ref 0.0–0.0)
Platelets: 25 10*3/uL — ABNORMAL LOW (ref 142–346)
RBC: 4.73 10*6/uL (ref 4.20–5.90)
RDW: 19 % — ABNORMAL HIGH (ref 11–15)
WBC: 1.28 10*3/uL — CL (ref 3.10–9.50)

## 2019-02-13 LAB — ECG 12-LEAD
Atrial Rate: 70 {beats}/min
Q-T Interval: 364 ms
QRS Duration: 82 ms
QTC Calculation (Bezet): 495 ms
R Axis: 40 degrees
T Axis: 53 degrees
Ventricular Rate: 111 {beats}/min

## 2019-02-13 LAB — MAN DIFF ONLY
Atypical Lymphocytes %: 1 %
Atypical Lymphocytes Absolute: 0.01 10*3/uL — ABNORMAL HIGH (ref 0.00–0.00)
Band Neutrophils Absolute: 0.18 10*3/uL (ref 0.00–1.00)
Band Neutrophils: 14 %
Basophils Absolute Manual: 0 10*3/uL (ref 0.00–0.08)
Basophils Manual: 0 %
Eosinophils Absolute Manual: 0.03 10*3/uL (ref 0.00–0.44)
Eosinophils Manual: 2 %
Lymphocytes Absolute Manual: 0.27 10*3/uL — ABNORMAL LOW (ref 0.42–3.22)
Lymphocytes Manual: 21 %
Metamyelocytes Absolute: 0.06 10*3/uL — ABNORMAL HIGH
Metamyelocytes: 5 %
Monocytes Absolute: 0.04 10*3/uL — ABNORMAL LOW (ref 0.21–0.85)
Monocytes Manual: 3 %
Neutrophils Absolute Manual: 0.69 10*3/uL — ABNORMAL LOW (ref 1.10–6.33)
Segmented Neutrophils: 54 %

## 2019-02-13 LAB — CELL MORPHOLOGY
Cell Morphology: ABNORMAL — AB
Platelet Estimate: DECREASED — AB

## 2019-02-13 LAB — TROPONIN I: Troponin I: 0.02 ng/mL (ref 0.00–0.05)

## 2019-02-13 LAB — IHS D-DIMER: D-Dimer: 2.41 ug/mL FEU — ABNORMAL HIGH (ref 0.00–0.70)

## 2019-02-13 LAB — IMMATURE PLT FRACTION: Immature Platelet Fraction: 2.3 % (ref 0.1–8.6)

## 2019-02-13 LAB — PT AND APTT
PT INR: 1.2 — ABNORMAL HIGH (ref 0.9–1.1)
PT: 14.9 s (ref 12.6–15.0)
PTT: 32 s (ref 23–37)

## 2019-02-13 LAB — GFR: EGFR: >60

## 2019-02-13 MED ORDER — CEFDINIR 300 MG PO CAPS
300.00 mg | ORAL_CAPSULE | Freq: Two times a day (BID) | ORAL | 0 refills | Status: AC
Start: 2019-02-13 — End: 2019-02-23

## 2019-02-13 MED ORDER — ONDANSETRON HCL 4 MG/2ML IJ SOLN
4.00 mg | Freq: Once | INTRAMUSCULAR | Status: DC
Start: 2019-02-13 — End: 2019-02-13
  Filled 2019-02-13: qty 2

## 2019-02-13 MED ORDER — SODIUM CHLORIDE 0.9 % IV BOLUS
500.00 mL | Freq: Once | INTRAVENOUS | Status: AC
Start: 2019-02-13 — End: 2019-02-13
  Administered 2019-02-13: 13:00:00 500 mL via INTRAVENOUS

## 2019-02-13 MED ORDER — IODIXANOL 320 MG/ML IV SOLN
100.00 mL | Freq: Once | INTRAVENOUS | Status: AC | PRN
Start: 2019-02-13 — End: 2019-02-13
  Administered 2019-02-13: 100 mL via INTRAVENOUS

## 2019-02-13 MED ORDER — SODIUM CHLORIDE 0.9 % IV BOLUS
500.00 mL | Freq: Once | INTRAVENOUS | Status: AC
Start: 2019-02-13 — End: 2019-02-13
  Administered 2019-02-13: 17:00:00 500 mL via INTRAVENOUS

## 2019-02-13 MED ORDER — CEFTRIAXONE SODIUM 1 G IJ SOLR
1.00 g | INTRAMUSCULAR | Status: DC
Start: 2019-02-13 — End: 2019-02-13
  Administered 2019-02-13: 17:00:00 1 g via INTRAVENOUS
  Filled 2019-02-13: qty 1000

## 2019-02-13 NOTE — ED Notes (Signed)
Ambulated to BR w/o difficulty. For discharge.

## 2019-02-13 NOTE — Discharge Instructions (Addendum)
Follow-up with your oncologist at [facility] this week.  Call tomorrow to discuss when you should be there.  Your platelets today were 25 which is down from 54.  Your hemoglobin was 9 last week and is now 16 which shows significant dehydration.  Please keep up with your fluids.  This is likely the cause of your syncopal event.  Do not hit your head.  If you are dizzy sit down and do not walk around.  If you have any bleeding please come back here or go to [facility] immediately for platelet transfusion.  If you hit your head please come here immediately as well.

## 2019-02-13 NOTE — ED Provider Notes (Signed)
History     Chief Complaint   Patient presents with   . Loss of Consciousness     HPI 78 year old male with history of atrial fibrillation, high cholesterol, MDS, hypertension, A. fib presents today for syncope.  Patient states he had [employer] for breakfast with his wife and then had a lot of vomiting while sitting on the toilet.  He was trying to have a bowel movement and that is the last thing he remembered.  Patient denies any diarrhea or abdominal pain.  No headache or palpitations.  No chest pain or shortness of breath.  Patient was found by his wife leaning against the wall unresponsive and still on the toilet.  He was unresponsive for about 10 minutes and remembers the EMS crew taking him out to the ambulance.  Patient denies any history of syncope.  He is status post bone marrow transplant for MDS about 100 days ago at [facility] and he was released from there about 3 weeks ago.  He had medication changes after that.  He no longer takes aspirin but he still takes Eliquis for his A. fib.  He has not taken that in 4 days as his platelets were found to be low recently and they are expecting to give him a transfusion of platelets later this week.  Patient has an oncologist locally, Dr. Kathi Ludwig, but he is mainly followed by [facility] at this time due to the transplant.  He does not take any steroids.  No blood thinners now.  His MDS was diagnosed about 2 years ago.  Patient states he feels well at this time and has no complaints.  He thinks he was napping.    Past medical history significant for MDS status post bone marrow transplant, anemia, hypertension, high cholesterol, A. Fib  Current medications include Valtrex, Lipitor, Synthroid, metoprolol, Protonix and Zantac  No known drug allergies  Non-smoker.  Lives at home with wife    Past Medical History:   Diagnosis Date   . Anemia     PANCYTOPENIA-SEE DR [facility] NOTE IN EPIC 06-25-2015; CBC abnl NOW FOLLOWED BY DR ALI, SYED HGB 9.02 Apr 2016   .  Arrhythmia 12/27/2015    AFib - new onset- taking Eliquis FOLLOWED BY [facility]   . Arthritis     BIL TOTAL KNEES   . 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 Chronic gout     no meds; no symptoms NO RECENT FALRE UP   . 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 Disorder of prostate     BPH -NO MEDS   . Encounter for blood transfusion 11/2017    for MDS syndrome,  no reaction   . GERD (gastroesophageal reflux disease)     controlled with rx   . Hard to intubate     experienced once after radiation for throat cancer in 2003; subsequent intubations have presented no problems   . Hiatal hernia    . Hyperlipidemia     on rx   . Hypertensive disorder     110/60 - 120/70 at recent MD appointments   . Hypothyroidism     on Rx   . Interstitial lung disease 2013    Oxygen Sat is at 94-95 typically; does not impede activity; no longer uses home O2   . 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 MDS (myelodysplastic syndrome)    . Pulmonary embolus 2013   . Thrombocytopenia DX 2014    PLATELET LEVEL 59  05-12-2017 FOLLOWED  BY DR ALI SYED NOTES IN [facility] 06-16-2017 RE HX OF IT       Past Surgical History:   Procedure Laterality Date   . ARTERIAL-  CAROTID ANGIOGRAPHY Left 01/25/2018    Procedure: ARTERIAL-  CAROTID ANGIOGRAPHY;  Surgeon: Benita Gutter, MD;  Location: [facility];  Service: Cardiovascular;  Laterality: Left;   . BONE MARROW BIOPSY  03/2015    FOLLOWED BY DR  ALI SYED HEMATOLOGIST   . CATARACT EXTRACTION Left    . COLONOSCOPY N/A 07/09/2017    Procedure: COLONOSCOPY;  Surgeon: Trixie Dredge, MD;  [facility];  Service: Gastroenterology;  Laterality: N/A;   . EGD  2012, 2017   . EGD N/A 08/23/2015    Procedure: EGD;  Surgeon: Trixie Dredge, MD;  Location: [facility];  Service: Gastroenterology;  Laterality: N/A;   . ENDOSTENT,  ABDOMINAL AORTIC ANEURYSM (EVAR) N/A 03/04/2016    Procedure: Endostent, Abdominal Aortic Aneurysm (Evar);  Surgeon: Benita Gutter, MD;  Location: [facility] HEART OR;  Service: Vascular;  Laterality: N/A;   . FRACTURE SURGERY  1973    RT TIB/FIB CLOSED REDUCTION   . HERNIA REPAIR Right 2014    INGUINAL    . JOINT REPLACEMENT Left 03/2009    Left TKR,    . JOINT REPLACEMENT Right 2018 MARCH 14    RIGHT TOTAL KNEE   . RT KNEE ACL  1988   . THORACOSCOPY, (VATS) Right 05/2011    RUL, RLL   . TONSILLECTOMY  1946       Family History   Problem Relation Age of Onset   . Aplastic anemia Mother    . Hypertension Father        Social  Social History     Tobacco Use   . Smoking status: Former Smoker     Packs/day: 1.50     Years: 20.00     Pack years: 30.00     Quit date: 04/17/1982     Years since quitting: 36.8   . Smokeless tobacco: Never Used   . Tobacco comment: CIGAR 5-6 TIMES PER YEAR -STILL SKMOKES THEM   Substance Use Topics   . Alcohol use: Yes     Alcohol/week: 2.0 standard drinks     Types: 2 Shots of liquor per week   . Drug use: No       .     No Known Allergies    Home Medications     Med List Status: In Progress Set By: Darlin Coco, RN at 02/13/2019 12:25 PM                apixaban (ELIQUIS) 5 MG     Take 5 mg by mouth every 12 (twelve) hours     aspirin EC 81 MG EC tablet     Take 1 tablet (81 mg total) by mouth daily     atorvastatin (LIPITOR) 40 MG tablet     Take 80 mg by mouth every morning        levothyroxine (SYNTHROID, LEVOTHROID) 25 MCG tablet     Take 50 mcg by mouth Once a day at 6:00am        metoprolol succinate  XL (TOPROL-XL) 25 MG 24 hr tablet     Take 25 mg by mouth every morning.         Multiple Vitamin (MULTIVITAMIN) tablet     Take 1 tablet by mouth every morning        pantoprazole (PROTONIX) 40 MG tablet     Take 20 mg by mouth every morning.         raNITIdine (ZANTAC) 75 MG tablet     Take 75 mg by mouth nightly.     valACYclovir HCL (VALTREX) 500 MG tablet     Take 500 mg by mouth 2  (two) times daily                     Review of Systems   CONSTITUTIONAL: No weight loss, fever, chills, weakness or fatigue.   HEENT: Eyes: No visual loss, blurred vision, double vision or yellow sclerae. Ears, Nose, Throat: No hearing loss, sneezing, congestion, runny nose or sore throat.   SKIN: No rash or itching.   CARDIOVASCULAR: No chest pain, chest pressure or chest discomfort. No palpitations or edema.   RESPIRATORY: No shortness of breath, cough or sputum.   GASTROINTESTINAL: No anorexia or diarrhea. No abdominal pain or blood.   NEUROLOGICAL: No headache, dizziness,  paralysis, ataxia, numbness or tingling in the extremities. No change in bowel or bladder control.   MUSCULOSKELETAL: No muscle, back pain, joint pain or stiffness.   HEMATOLOGIC: No anemia, bleeding or bruising.   LYMPHATICS: No enlarged nodes. No history of splenectomy.   PSYCHIATRIC: No history of depression or anxiety.   ENDOCRINOLOGIC: No reports of sweating, cold or heat intolerance. No polyuria or polydipsia.   ALLERGIES: No history of asthma, hives, eczema or rhinitis  All other systems  reviewed and are otherwise negative.      Physical Exam    BP: 95/67, Resp Rate: 16, Weight: 76.1 kg    Physical Exam   Nursing note and vitals reviewed.  Constitutional:  Well developed, well nourished. Awake & Oriented x3.  Head:  Atraumatic. Normocephalic.    Eyes:  PERRL. EOMI. Conjunctivae are pale.  ENT:  Mucous membranes are moist and intact. Oropharynx is clear and symmetric.  Patent airway.  Neck:  Supple. Full ROM.    Cardiovascular:  (+)tachycardic. irregular rhythm. No murmurs, rubs, or gallops.  Pulmonary/Chest:  No evidence of respiratory distress. Clear to auscultation bilaterally.  No wheezing, rales or rhonchi.   Abdominal:  Soft and non-distended. There is no tenderness. No rebound, guarding, or rigidity.  Back:  Full ROM. Nontender.  Extremities:  No edema. No cyanosis. No clubbing. Full range of motion in all extremities. (+)pulses  equal bilaterally  Skin:  Skin is warm and dry.  No diaphoresis. No rash.   Neurological:  Alert, awake, and appropriate. Normal speech. Motor normal.  Psychiatric:  Good eye contact. Normal interaction, affect, and behavior.          MDM and ED Course     ED Medication Orders (From admission, onward)    Start Ordered     Status Ordering Provider    02/13/19 1236 02/13/19 1235  ondansetron (ZOFRAN) injection 4 mg  Once     Route: Intravenous  Ordered Dose: 4 mg     Ordered Aubreanna Percle A    02/13/19 1235 02/13/19 1235  sodium chloride 0.9 % bolus 500 mL  Once     Route: Intravenous  Ordered Dose: 500 mL  Ordered Laycee Fitzsimmons A             MDM  ECG:  AF at rate of 111 with no acute st/t changes and normal axis     labs, zofran iv fluid bolus ordered.  Head ct and cxr requested.    Results for orders placed or performed during the hospital encounter of 02/13/19   COVID-19 (SARS-CoV-2)    Specimen: Nasopharynx; Nasopharyngeal Swab    Narrative    o Collect and clearly label specimen type:  o PREFERRED-Upper respiratory specimen: One Nasopharyngeal  Swab in Transport Media.  o Hand deliver to laboratory ASAP   CBC and differential   Result Value Ref Range    WBC 1.28 (LL) 3.10 - 9.50 x10 3/uL    Hgb 16.2 12.5 - 17.1 g/dL    Hematocrit 54.0 98.1 - 49.6 %    Platelets 25 (L) 142 - 346 x10 3/uL    RBC 4.73 4.20 - 5.90 x10 6/uL    MCV 100.4 (H) 78.0 - 96.0 fL    MCH 34.2 (H) 25.1 - 33.5 pg    MCHC 34.1 31.5 - 35.8 g/dL    RDW 19 (H) 11 - 15 %    MPV 9.6 8.9 - 12.5 fL    Nucleated RBC 0.0 0.0 - 0.0 /100 WBC    Absolute NRBC 0.00 0.00 - 0.00 x10 3/uL    Narrative    Replace urinary catheter prior to obtaining the urine culture  if it has been in place for greater than or equal to 14  days:->N/A No Foley  Indications for U/A Reflex to Micro - Reflex to  Culture:->Suprapubic Pain/Tenderness or Dysuria  Rescheduled by [id number] at 02/13/2019 13:15 Reason: Patient unavailable   D-Dimer   Result Value Ref Range    D-Dimer 2.41 (H)  0.00 - 0.70 ug/mL FEU    Narrative    Replace urinary catheter prior to obtaining the urine culture  if it has been in place for greater than or equal to 14  days:->N/A No Foley  Indications for U/A Reflex to Micro - Reflex to  Culture:->Suprapubic Pain/Tenderness or Dysuria  Rescheduled by [id number] at 02/13/2019 13:15 Reason: Patient unavailable   PT/APTT   Result Value Ref Range    PT 14.9 12.6 - 15.0 sec    PT INR 1.2 (H) 0.9 - 1.1    PTT 32 23 - 37 sec    Narrative    Replace urinary catheter prior to obtaining the urine culture  if it has been in place for greater than or equal to 14  days:->N/A No Foley  Indications for U/A Reflex to Micro - Reflex to  Culture:->Suprapubic Pain/Tenderness or Dysuria  Rescheduled by [id number] at 02/13/2019 13:15 Reason: Patient unavailable   Troponin I   Result Value Ref Range    Troponin I 0.02 0.00 - 0.05 ng/mL    Narrative    Replace urinary catheter prior to obtaining the urine culture  if it has been in place for greater than or equal to 14  days:->N/A No Foley  Indications for U/A Reflex to Micro - Reflex to  Culture:->Suprapubic Pain/Tenderness or Dysuria  Rescheduled by [id number] at 02/13/2019 13:15 Reason: Patient unavailable   UA Reflex to Micro - Reflex to Culture    Specimen: Urine, Clean Catch   Result Value Ref Range    Urine Type Urine, Clean Ca     Color, UA Yellow Colorless - Yellow    Clarity, UA Clear  Clear - Hazy    Specific Gravity UA 1.035 1.001 - 1.035    Urine pH 7.0 5.0 - 8.0    Leukocyte Esterase, UA Negative Negative    Nitrite, UA Negative Negative    Protein, UR 30 (A) Negative    Glucose, UA Negative Negative    Ketones UA Negative Negative    Urobilinogen, UA Normal 0.2 - 2.0 mg/dL    Bilirubin, UA Negative Negative    Blood, UA Negative Negative    RBC, UA 0-2 0 - 5 /hpf    WBC, UA 0-5 0 - 5 /hpf    Narrative    Replace urinary catheter prior to obtaining the urine culture  if it has been in place for greater than or equal to 14  days:->N/A No Foley   Indications for U/A Reflex to Micro - Reflex to  Culture:->Suprapubic Pain/Tenderness or Dysuria   Comprehensive metabolic panel   Result Value Ref Range    Glucose 117 (H) 70 - 100 mg/dL    BUN 16.1 9.0 - 09.6 mg/dL    Creatinine 1.1 0.7 - 1.3 mg/dL    Sodium 045 (L) 409 - 145 mEq/L    Potassium 3.7 3.5 - 5.1 mEq/L    Chloride 103 100 - 111 mEq/L    CO2 22 22 - 29 mEq/L    Calcium 7.9 7.9 - 10.2 mg/dL    Protein, Total 6.0 6.0 - 8.3 g/dL    Albumin 3.8 3.5 - 5.0 g/dL    AST (SGOT) 42 (H) 5 - 34 U/L    ALT 46 0 - 55 U/L    Alkaline Phosphatase 79 38 - 106 U/L    Bilirubin, Total 0.6 0.2 - 1.2 mg/dL    Globulin 2.2 2.0 - 3.6 g/dL    Albumin/Globulin Ratio 1.7 0.9 - 2.2    Anion Gap 10.0 5.0 - 15.0    Narrative    Replace urinary catheter prior to obtaining the urine culture  if it has been in place for greater than or equal to 14  days:->N/A No Foley  Indications for U/A Reflex to Micro - Reflex to  Culture:->Suprapubic Pain/Tenderness or Dysuria  Rescheduled by [id number] at 02/13/2019 13:15 Reason: Patient unavailable   GFR   Result Value Ref Range    EGFR >60.0     Narrative    Replace urinary catheter prior to obtaining the urine culture  if it has been in place for greater than or equal to 14  days:->N/A No Foley  Indications for U/A Reflex to Micro - Reflex to  Culture:->Suprapubic Pain/Tenderness or Dysuria  Rescheduled by [id number] at 02/13/2019 13:15 Reason: Patient unavailable   Immature PLT Fraction   Result Value Ref Range    Immature Platelet Fraction 2.3 0.1 - 8.6 %    Narrative    Replace urinary catheter prior to obtaining the urine culture  if it has been in place for greater than or equal to 14  days:->N/A No Foley  Indications for U/A Reflex to Micro - Reflex to  Culture:->Suprapubic Pain/Tenderness or Dysuria  Rescheduled by [id number] at 02/13/2019 13:15 Reason: Patient unavailable   Cell MorpHology   Result Value Ref Range    Cell Morphology Abnormal (A)     Platelet Estimate Decreased (A)     Dual RBC  Population Present (A)     Narrative    Replace urinary catheter prior to obtaining the urine culture  if it has been in place for greater than  or equal to 14  days:->N/A No Foley  Indications for U/A Reflex to Micro - Reflex to  Culture:->Suprapubic Pain/Tenderness or Dysuria  Rescheduled by [id number] at 02/13/2019 13:15 Reason: Patient unavailable   Manual Differential   Result Value Ref Range    Segmented Neutrophils 54 None %    Band Neutrophils 14 None %    Lymphocytes Manual 21 None %    Monocytes Manual 3 None %    Eosinophils Manual 2 None %    Basophils Manual 0 None %    Metamyelocytes 5 None %    Atypical Lymphocytes % 1 None %    Neutrophils Absolute Manual 0.69 (L) 1.10 - 6.33 x10 3/uL    Band Neutrophils Absolute 0.18 0.00 - 1.00 x10 3/uL    Lymphocytes Absolute Manual 0.27 (L) 0.42 - 3.22 x10 3/uL    Monocytes Absolute 0.04 (L) 0.21 - 0.85 x10 3/uL    Eosinophils Absolute Manual 0.03 0.00 - 0.44 x10 3/uL    Basophils Absolute Manual 0.00 0.00 - 0.08 x10 3/uL    Metamyelocytes Absolute 0.06 (H) 0 x10 3/uL    Atypical Lymphocytes Absolute 0.01 (H) 0.00 - 0.00 x10 3/uL    Narrative    Replace urinary catheter prior to obtaining the urine culture  if it has been in place for greater than or equal to 14  days:->N/A No Foley  Indications for U/A Reflex to Micro - Reflex to  Culture:->Suprapubic Pain/Tenderness or Dysuria  Rescheduled by [id number] at 02/13/2019 13:15 Reason: Patient unavailable     CT Angio Chest (PE study)   Final Result      1. No evidence of pulmonary embolism.   2. Bilateral airspace disease. Aspiration and pneumonia including   Covid-19 infection should be excluded.   3. Small right lung nodules measuring up to 3 mm in size. According to   the Fleischner Society guidelines, in low risk patients, no follow-up is   needed.  In high risk patients, optional CT follow-up can be obtained at   12 months.      Please see above for additional findings.      Darra Lis, MD    02/13/2019 3:04 PM       CT Head WO Contrast   Final Result       1. No acute intracranial hemorrhage or mass effect.   2. Chronic appearing small vessel ischemic changes.      Nicoletta Dress, MD    02/13/2019 3:02 PM        When I was within 6 feet of this patient I donned the following PPE:  Surgical Mask Yes, Gloves Yes, Gown No  ; Goggles Yes; Face Shield No  , 32M 6000 Respirator No  ; N95 Yes.  The patient was wearing a mask during my evaluation Yes.  I, Wetzel Bjornstad, MD, have been the primary provider for Brent [name]  during this Emergency Dept visit.  Oxygen saturation by pulse oximetry is 95%-100%, Normal.  Interventions: None Needed.    Patient initially refused CXR but waited for other results to discuss if it is needed.   He agreed to CT scan.  Results above. Patient has no risk for COVID that is known and it seems unlikely. I will give him iv rocephin prior to discharge.    I discussed his case witih his on call oncologist at [facility] who states his hgb was 9 last week with platelets of 54.  He will likely need to  have a platelet transfusion sooner than the scheduled 11/20 appointment. He has never had anything more than pulmonary nodules on his chest ct or cxr. This infection is new. He does not have a cough or fevers.  We discussed the plan for antibiotics and his results here that indicate dehydration with SG of ua at 1.035 and hgb that went from 9 to 16 in a few day. Patient admits to being behind in his fluids and agrees to hyrdate more aggressively at home.  We discussed how he cannot hit his head and if he is dizzy he must sit down with his thrombocytopenia.  Patient and wife understand instructions.  Patient return immediately for any head trauma or any bleeding.  He will call [facility] tomorrow morning to discuss when he should come back and follow his Covid test that was sent which should result in 2 days.  I will discharge him on Omnicef.  Prescription sent to his pharmacy.  He will not take any blood  thinners at this time.    Pt reexamined and his BP is 109 systolic with heart rate in the upper 90's but variable due to AF. He feels well and is not dizzy.  As per his wife and [facility] his blood pressure runs low around 100 - 110 systolic.  His wife states it is sometimes lower.    Discussed results and diagnosis with patient and wife at the bedside.  Reviewed warning signs for worsening condition as well as indications for follow-up with pmd and return to the ED.   patient expressed understanding of instructions.          Procedures    Clinical Impression & Disposition     Clinical Impression  Final diagnoses:   Thrombocytopenia   Dehydration   Pneumonia of both lungs due to infectious organism, unspecified part of lung   Syncope and collapse   Neutropenia, unspecified type        ED Disposition     ED Disposition Condition Date/Time Comment    Discharge Boarder to Home  Sun Feb 13, 2019  6:38 PM            New Prescriptions    CEFDINIR (OMNICEF) 300 MG CAPSULE    Take 1 capsule (300 mg total) by mouth 2 (two) times daily for 10 days                 Alinda Deem, MD  02/13/19 1924

## 2019-02-13 NOTE — ED Notes (Signed)
Bed: 08  Expected date:   Expected time:   Means of arrival:   Comments:  Medic 622b

## 2019-02-13 NOTE — ED Notes (Signed)
Ambulated to & from BR w/o difficulty.

## 2019-02-13 NOTE — ED Notes (Signed)
Pt's wife arrived & is objecting to CXR & wants [place] 'to coordinate care with [facility] due to bone marrow xplant 101 days ago.' ED MD notified.

## 2019-02-14 LAB — COVID-19 (SARS-COV-2): SARS CoV 2 Overall Result: NOT DETECTED

## 2019-02-15 NOTE — Progress Notes (Signed)
COVID NEGATIVE. NOTE SENT , FAX TO PCP CT /LAB   Your COVID 19 TEST : NEGATIVE. RECOMMEND FOLLOW CDC GUIDELINES.     RADIOLOGY: RECOMMEND TO FOLLOW UP WITH ABNORMAL CT SCAN OF CHEST:   1. PNEUMONIA   2. LUNG NODULES: DISCUSS WITH YOUR PRIMARY CARE DOCTOR NEED FOR F/U AND REPEAT CT SCAN IN 6 MOS. BENIGN VS MALIGNANCY     Please continue social distancing /quarantine. If you have questions , please contact [phone]. If you have worsening chest pain, shortness of breath, any worsening signs and symptoms return to ED .  Please follow up with your doctor this week. If worsening sign and symptoms return to ED.         Please do not respond to this email    K. Kyren Knick, MD  [facility]  [facility]  [phone]

## 2019-02-16 ENCOUNTER — Telehealth (INDEPENDENT_AMBULATORY_CARE_PROVIDER_SITE_OTHER): Payer: Self-pay

## 2019-02-16 ENCOUNTER — Telehealth: Payer: Self-pay

## 2019-02-16 NOTE — Telephone Encounter (Addendum)
LMTRC on mobile. Told to call back to covid results line and check my chart online.         ----- Message from Leonia Reeves, MD sent at 02/16/2019  8:16 AM EST -----  Note sent thru my chart again, Recommend to contact pt : how is pt feeling ? Has pt had f/u ? If worsening ssx return to ED. See my note.      COVID NEGATIVE. NOTE SENT , FAX TO PCP CT /LAB  Your COVID 19 TEST : NEGATIVE. RECOMMEND FOLLOW CDC GUIDELINES.     RADIOLOGY: RECOMMEND TO FOLLOW UP WITH ABNORMAL CT SCAN OF CHEST:   1. PNEUMONIA   2. LUNG NODULES: DISCUSS WITH YOUR PRIMARY CARE DOCTOR NEED FOR F/U AND REPEAT CT SCAN IN 6 MOS. BENIGN VS MALIGNANCY    Please continue social distancing /quarantine. If you have questions , please contact [phone]. If you have worsening chest pain, shortness of breath, any worsening signs and symptoms return to ED . Please follow up with your doctor this week. If worsening sign and symptoms return to ED.

## 2019-02-16 NOTE — Telephone Encounter (Addendum)
Spoke with patient, states is feeling better since seen in ED. Informed of neg covid and to continue quarantine per CDC guidelines. Discussed abnormalities on CT scan, pt was aware of pulmonary nodules. Asked that CT be faxed to his pulmonologist Dr. Jadene Pierini. Informed to f/u with PCP, read my chart note and to return to ED with worsening s/s. Pt states understanding. Faxed CT/neg covid to Dr. Loyal Gambler.       ----- Message from Leonia Reeves, MD sent at 02/16/2019  8:16 AM EST -----  Note sent thru my chart again, Recommend to contact pt : how is pt feeling ? Has pt had f/u ? If worsening ssx return to ED. See my note.      COVID NEGATIVE. NOTE SENT , FAX TO PCP CT /LAB  Your COVID 19 TEST : NEGATIVE. RECOMMEND FOLLOW CDC GUIDELINES.     RADIOLOGY: RECOMMEND TO FOLLOW UP WITH ABNORMAL CT SCAN OF CHEST:   1. PNEUMONIA   2. LUNG NODULES: DISCUSS WITH YOUR PRIMARY CARE DOCTOR NEED FOR F/U AND REPEAT CT SCAN IN 6 MOS. BENIGN VS MALIGNANCY    Please continue social distancing /quarantine. If you have questions , please contact [phone]. If you have worsening chest pain, shortness of breath, any worsening signs and symptoms return to ED . Please follow up with your doctor this week. If worsening sign and symptoms return to ED.

## 2019-02-16 NOTE — Telephone Encounter (Signed)
COVID-19 Test Results Call Center    Received call from patient requesting their COVID-19 test results.    After verifying patient name, address and DOB, patient was provided with the NEGATIVE result.    Patient's questions were answered.    Confirmed that patient's primary care physician is Vivi Barrack, MD at phone number [phone].  Routed Epic note to primary care physician.     Patient was advised to follow CDC guidance on social distancing and hand washing.    Advised patient to contact their Primary Care Physician in the event that patient's symptoms change or worsen.      Reminded patient that the emergency room is always available in the event of an emergency.      Marigene Ehlers, LPN  [id number] Notification Team  Direct Line: [phone]  COVID-19 Test Results Call Center: [phone]

## 2019-07-28 ENCOUNTER — Other Ambulatory Visit: Payer: Self-pay | Admitting: Cardiovascular Disease

## 2020-07-10 NOTE — Pulmonary Rehab ITP (Signed)
Pulmonary Rehab Individual Treatment Plan    Individual Treatment Plan  Assessment Period:  Initial Evaluation  Session #: 1  Referring Diagnosis: COPD  Date of Event/Onset Date:  (pending)  Comorbidities: Other, Hypertension, Malignancy (PNA, GVD/NSIP)  First Exercise Session (date): 07/18/20  Program Status: Enrolled    Education Assessment  Barriers to Learning: None  Stages of Change: Preparation    Fall Risk Score  Fall Risk Score:  (pending)  Comment: gets numbness on bottom of feet which can cause him unsteadiness and unbalance    Medication Compliance  Assessment: Oral Corticosteroids  Education/Intervention: Attend medication class, Medication reconciliation, Medication handouts, Reinforce medication adherence, Educate on proper timing/technique of inhalers   Goals: Daily medication adherence, Patient verbalizes adherence, Patient able to complete teachback  Goals Assessment: Not applicable on initial assessment  Comment: Prednisone    Exercise Assessment  Work Related Physical Requirements: No  Physical Limitation: No  Exercise History:  (pending)  Current Peak MET Level: pending    Exercise Prescription  THR: 108-140 (calculated from RHR 77 from Dr office notes from 06/27/20)  Frequency: Pulmonary Rehab 2 days/week, Home 3 days/week  BORG RPE: 11-15  BORG RPD:  3-5  Exercise Time: 30-39 minutes  Mode: Arm Ergometer, NuStep, Walking  Goal Peak MET Level: pending  Resistance Training: Begin when cleared by PR Clinician, 2-3 days per week, 10-15 repetitions, 1-3 sets to moderate fatigue, Bands, Dumbbells, Chair exercise  Adherence: Adheres to Pulmonary exercise guidelines, Adheres to strength exercise guidelines  Education/Intervention: Exercise Education Class, Home exercise guidelines, Educate on RPE, RPD, THR, warm up and cool down, Self-monitoring using pulse taking, heart rate monitor and activity tracker, Progress time and intensity when a steady state of HR and RPE occur, Educate on S&S of Pulmonary  compromise, Benefit of Exercise Class  Goals: Pulmonary exercise 30-45 minutes 5x week, Strength exercise 2-3 days per week, Increase 6 minute walk by 30 M (98.43 ft), Avoid inactivity  Goal Assessment: Not applicable on initial assessment  Comment:      6 Minute Walk Test  Completed Test: Pending    Nutrition  Height: 172.7 cm (5\' 8" )  Weight: 189lbs  BMI (Calculated): 28.7  Waist Circumference: (inches): pending  Body Composition: Overweight BMI 25-29.9  Current Eating Plan:  (pending)  Food Log: Provided (given on first day of exercise)  Hydration:  (pending)  Dietician Consult: Consult not yet scheduled  Education/Intervention:  (Pulmonary nutrition and hydration)  Goals: BMI 18.5 - 24.9, Waist (men) <40, (women) <35 inches  Goals Assessment: Not applicable on initial assessment  Comment:      Diabetes  Diabetes: No    Psychosocial/Stress  Assessment: Adequate support system  Depression Tool: PHQ-9  Initial Depression Screening Score (PHQ-9 or HADS:  (pending)   COPD Assessment Test (CAT) Score: pending  Impact Level:  (pending)  Barriers to Learning: None   Barriers to Attendance: None  Lives With: Spouse/Partner  Living Characteristics:  (pending)  Support System: Research scientist (physical sciences):  (pending)  Work Status: Retired  Tax adviser: Stress management class, Note to MD if survey indicates needs, Stress management/relaxation handouts, Regular physical activity/exercise, Assist patient with identifying stressors, Educate on positive coping mechanisms  Goals: Support system, Positive coping mechanisms, Manageable level of psychosocial stress due to anxiety. depression, anger and hostility, Improvement in psychosocial survey score, Improved quality of life  Goal Assessment: Not applicable on initial assessment  Comment:      O2/SPO2  Assessment:  (pending)  SpO2 at Rest:  94 (obtained from Dr. office note)  SpO2 During Peak Exercise:  (pending)  Education/Intervention: Monitor SpO2  w/rest and exercise, Educate on hypoxia and risks of hypoxemia, Recognizes s/s of hypoxemia, Pulse oximetry education   Oxygen Titration: N/A  Goals: SpO2 > 90%, Home pulse oximetry education completed  Goals Assessment: Not applicable on initial assessment  Comment:       Dyspnea  Assessment: Unable to control  PFT: Yes  Date of Last PFT: 06/27/20  FVC%: 2.66  FEV-1%: 2.27  FEV-1/FVC%: .85  UCSDSOBQ Score: pending   MMRC:  (pending)  Education/Intervention: Home recommendations, Pursed lip, diaphragmatic breathing, Positions for relief of dyspnea, Coach for control during exercise tx, Energy conservation, Infection prevention, Weather guidelines, Seeking medical care, Lung anatomy ed, Breathing Technique with stair climbing/descending  Goals: Borg Dyspnea rated max 4-6, Independent with breath control, Decrease dyspnea on exertion   Goal Assessment: Not applicable on initial assessment    Comment:      Airway Clearance  Assessment:  Respiratory related hospitalization within past 12 months  Education/Intervention: Educate on exercise and secretion clearance, Respiratory infection prevention, Hydration, Hand hygiene, Flu/Pneumonia vaccine, Self-evaluation of sputum, Educate on reporting: purulent sputum, dyspnea or fatigue, When to call MD, Environmental triggers  Goals: Effective secretion clearance, Demonstrate effective cough, Respiratory infection prevention/management, Exacerbation prevention, Avoid environmental triggers  Goals Assessment:Not applicable on initial assessment     Sleep  Assessment:  Healthy Sleep Pattern  STOP BANG Score:pending  STOP BANG Risk: (pending)  Neck Circumference:  (pending)  Education/Intervention: Education completed on Healthy Sleep Training  Goals: < 17 in men  Goal Assessment: Not applicable on initial assessment      Tobacco Cessation  Assessment: Former tobacco user  Tobacco Use: Yes  Quite Date:2003   Packs Per Day/Years Smoked: 1/26  Pack Year: 26  Education/Intervention:  Other (comment) (no intervention indicated at this time)  Goals: Remain tobacco free  Goal Assessment: Not applicable on initial assessment     Comment:      Patient Stated Goals  Patient Stated Goal 1: pending

## 2020-07-18 ENCOUNTER — Inpatient Hospital Stay
Admission: RE | Admit: 2020-07-18 | Discharge: 2020-07-18 | Disposition: A | Discharge status: Home / Self Care | Payer: Medicare Other | Source: Ambulatory Visit | Attending: Critical Care Medicine | Admitting: Critical Care Medicine

## 2020-07-18 VITALS — BP 110/66 | HR 56 | Resp 20 | Ht 67.99 in | Wt 192.8 lb

## 2020-07-18 DIAGNOSIS — J84111 Idiopathic interstitial pneumonia, not otherwise specified: Secondary | ICD-10-CM | POA: Insufficient documentation

## 2020-07-18 DIAGNOSIS — I10 Essential (primary) hypertension: Secondary | ICD-10-CM | POA: Insufficient documentation

## 2020-07-18 DIAGNOSIS — J449 Chronic obstructive pulmonary disease, unspecified: Secondary | ICD-10-CM | POA: Insufficient documentation

## 2020-07-18 DIAGNOSIS — C801 Malignant (primary) neoplasm, unspecified: Secondary | ICD-10-CM | POA: Insufficient documentation

## 2020-07-18 NOTE — Pulmonary Rehab ITP (Signed)
Pulmonary Rehab Individual Treatment Plan    Individual Treatment Plan  Assessment Period:  Initial Evaluation  Session #: 1  Referring Diagnosis: Interstitial lung disease  Date of Event/Onset Date: 10 years ago  Comorbidities: Other, Malignancy, Hypertension (PNA, GVD/NSIP)  First Exercise Session (date): 07/18/20  Program Status: Actively Enrolled    Education Assessment  Barriers to Learning: None  Stages of Change: Action    Fall Risk Score  Fall Risk Score: 6  Fall Risk Category:   low  Comment: gets numbness on bottom of feet which can cause him unsteadiness and unbalance    Medication Compliance  Assessment: Oral Corticosteroids  Education/Intervention: Attend medication class, Medication reconciliation, Medication handouts, Reinforce medication adherence, Educate on proper timing/technique of inhalers   Goals: Daily medication adherence, Patient verbalizes adherence, Patient able to complete teachback  Goals Assessment: Not applicable on initial assessment  Comment: Prednisone    Exercise Assessment  Work Related Physical Requirements: No  Physical Limitation: No  Exercise History: Yes  Mode:  Home equipment, Other (recumbent bike, squats)  Frequency: 4 days/week  Duration: 30-39 minutes  Resistance Training: Yes   Current Peak MET Level: 2.4 ( )    Exercise Prescription  THR: 108-140 (calculated from RHR 77 from Dr office notes from 06/27/20)  Frequency: Pulmonary Rehab 2 days/week, Home 3 days/week  BORG RPE: 11-15  BORG RPD:  3-5  Exercise Time: 30-39 minutes  Mode: Arm Ergometer, NuStep, Walking  Goal Peak MET Level: 3.4  Resistance Training: Begin when cleared by PR Clinician, 2-3 days per week, 10-15 repetitions, 1-3 sets to moderate fatigue, Bands, Dumbbells, Chair exercise  Adherence: Adheres to Pulmonary exercise guidelines, Adheres to strength exercise guidelines  Education/Intervention: Exercise Education Class, Home exercise guidelines, Educate on RPE, RPD, THR, warm up and cool down,  Self-monitoring using pulse taking, heart rate monitor and activity tracker, Progress time and intensity when a steady state of HR and RPE occur, Educate on S&S of Pulmonary compromise, Benefit of Exercise Class  Goals: Pulmonary exercise 30-45 minutes 5x week, Strength exercise 2-3 days per week, Increase 6 minute walk by 30 M (98.43 ft), Avoid inactivity  Goal Assessment: Not applicable on initial assessment    6 Minute Walk Test  Completed Test: Yes, During PR  Distance (in feet): 1031.4  MPH: 1.9  METS: 2.4  Post Test B/P: 124/62  Lowest SpO2: 94  Max BORG RPD: 3-Moderate  O2 Liters: 0    Nutrition  Height: 172.7 cm (5' 7.99")  Weight: 192.8lbs  BMI (Calculated): 29.3  Waist Circumference: (inches): 47  Body Composition: Overweight BMI 25-29.9  Current Eating Plan: Other (tries to eat a lot of fruits and vegetables, limits red meat intake)  Food Log: Provided (given on first day of exercise)  Hydration: Water, Coffee  Dietician Consult: Consult not yet scheduled  Education/Intervention:  (Pulmonary nutrition and hydration)  Goals: BMI 18.5 - 24.9, Waist (men) <40, (women) <35 inches  Goals Assessment: Not applicable on initial assessment    Diabetes  Diabetes: No    Psychosocial/Stress  Assessment: Adequate support system, Positive coping mechanisms  Depression Tool: PHQ-9  Initial Depression Screening Score (PHQ-9 or HADS: 1   Repeat Depression Score:  (at discharge)  Level of Depression Severity: None-Minimal 0-4  COPD Assessment Test (CAT) Score: 11  Impact Level: <10 - low  Barriers to Learning: None   Barriers to Attendance: None  Lives With: Spouse/Partner  Living Characteristics: 2 story, Stairs, [employer]  Support System: Spouse/Partner  Transportation Resources:  Able to drive  Work Status: Retired  Tax adviser: Stress management class, Note to MD if survey indicates needs, Stress management/relaxation handouts, Regular physical activity/exercise, Assist patient with identifying stressors, Educate  on positive coping mechanisms  Goals: Support system, Positive coping mechanisms, Manageable level of psychosocial stress due to anxiety. depression, anger and hostility, Improvement in psychosocial survey score, Improved quality of life  Goal Assessment: Not applicable on initial assessment    O2/SPO2  Assessment: O2 as needed  O2 Rx: 1-2lpm  O2 Delivery Device: Portable O2 concentrator (Inogen)  SpO2 at Rest: 98  SpO2 During Peak Exercise: 94 ( )  Education/Intervention: Monitor SpO2 w/rest and exercise, Educate on hypoxia and risks of hypoxemia, Recognizes s/s of hypoxemia, Pulse oximetry education   Oxygen Titration: N/A  Goals: SpO2 > 90%, Home pulse oximetry education completed  Goals Assessment: Not applicable on initial assessment  Comment: Patient reports not using oxygen as much at home because he is paying out of pocket for it. Patient owns a pulse oximeter.     Dyspnea  Assessment: Unable to control  PFT: Yes  Date of Last PFT: 06/27/20  FVC%: 2.66  FEV-1%: 2.27  FEV-1/FVC%: .85  UCSDSOBQ Score: 5   MMRC: O - I only get breathless with strenous exercise  Education/Intervention: Home recommendations, Pursed lip, diaphragmatic breathing, Positions for relief of dyspnea, Coach for control during exercise tx, Energy conservation, Infection prevention, Weather guidelines, Seeking medical care, Lung anatomy ed, Breathing Technique with stair climbing/descending  Goals: Borg Dyspnea rated max 4-6, Independent with breath control, Decrease dyspnea on exertion   Goal Assessment: Not applicable on initial assessment      Airway Clearance  Assessment:  Respiratory related hospitalization within past 12 months, Nasal congestion (nasal drainage and mucous)  Education/Intervention: Educate on exercise and secretion clearance, Respiratory infection prevention, Hydration, Hand hygiene, Flu/Pneumonia vaccine, Self-evaluation of sputum, Educate on reporting: purulent sputum, dyspnea or fatigue, When to call MD,  Environmental triggers  Goals: Effective secretion clearance, Demonstrate effective cough, Respiratory infection prevention/management, Exacerbation prevention, Avoid environmental triggers  Goals Assessment:Not applicable on initial assessment     Sleep  Assessment:  Healthy sleep pattern  STOP BANG Score:2  STOP BANG Risk:Low Risk  Neck Circumference: 18.5  Education/Intervention: Education completed on Healthy Sleep Training  Goals: < 17 in men  Goal Assessment: Not applicable on initial assessment      Tobacco Cessation  Assessment: Former tobacco user  Tobacco Use: No  Quite Date:2003   Packs Per Day/Years Smoked: 1/26  Pack Year: 26  Education/Intervention: Other (comment) (no intervention indicated at this time)  Goals: Remain tobacco free  Goal Assessment: Goals met     Comment: Patient reports abstaining from tobacco use.    Patient Stated Goals  Patient Stated Goal 1: Increase lung capacity   Goal Assessment: Not applicable on initial assessment   Patient Stated Goal 2: Maintain weight  Goal Assessment: Not applicable on initial assessment

## 2020-07-20 ENCOUNTER — Other Ambulatory Visit: Payer: Self-pay | Admitting: Otolaryngology

## 2020-07-23 ENCOUNTER — Inpatient Hospital Stay
Admission: RE | Admit: 2020-07-23 | Discharge: 2020-07-23 | Disposition: A | Discharge status: Home / Self Care | Payer: Medicare Other | Source: Ambulatory Visit

## 2020-07-23 VITALS — Wt 190.8 lb

## 2020-07-23 DIAGNOSIS — J449 Chronic obstructive pulmonary disease, unspecified: Secondary | ICD-10-CM

## 2020-07-25 ENCOUNTER — Inpatient Hospital Stay
Admission: RE | Admit: 2020-07-25 | Discharge: 2020-07-25 | Disposition: A | Discharge status: Home / Self Care | Payer: Medicare Other | Source: Ambulatory Visit

## 2020-07-25 VITALS — Wt 192.4 lb

## 2020-07-25 DIAGNOSIS — J449 Chronic obstructive pulmonary disease, unspecified: Secondary | ICD-10-CM

## 2020-07-26 ENCOUNTER — Telehealth: Payer: Medicare Other

## 2020-07-30 ENCOUNTER — Ambulatory Visit: Payer: Medicare Other

## 2020-07-30 ENCOUNTER — Inpatient Hospital Stay
Admission: RE | Admit: 2020-07-30 | Discharge: 2020-07-30 | Disposition: A | Discharge status: Home / Self Care | Payer: Medicare Other | Source: Ambulatory Visit | Attending: Critical Care Medicine | Admitting: Critical Care Medicine

## 2020-07-30 VITALS — Wt 192.0 lb

## 2020-07-30 DIAGNOSIS — I10 Essential (primary) hypertension: Secondary | ICD-10-CM | POA: Insufficient documentation

## 2020-07-30 DIAGNOSIS — C801 Malignant (primary) neoplasm, unspecified: Secondary | ICD-10-CM | POA: Insufficient documentation

## 2020-07-30 DIAGNOSIS — J84111 Idiopathic interstitial pneumonia, not otherwise specified: Secondary | ICD-10-CM | POA: Insufficient documentation

## 2020-07-30 DIAGNOSIS — J449 Chronic obstructive pulmonary disease, unspecified: Secondary | ICD-10-CM | POA: Insufficient documentation

## 2020-07-30 NOTE — Addendum Note (Signed)
Encounter addended by: Glendale Chard on: 07/30/2020 11:36 AM   Actions taken: Flowsheet accepted

## 2020-08-01 ENCOUNTER — Inpatient Hospital Stay
Admission: RE | Admit: 2020-08-01 | Discharge: 2020-08-01 | Disposition: A | Discharge status: Home / Self Care | Payer: Medicare Other | Source: Ambulatory Visit

## 2020-08-01 DIAGNOSIS — J449 Chronic obstructive pulmonary disease, unspecified: Secondary | ICD-10-CM

## 2020-08-06 ENCOUNTER — Inpatient Hospital Stay
Admission: RE | Admit: 2020-08-06 | Discharge: 2020-08-06 | Disposition: A | Discharge status: Home / Self Care | Payer: Medicare Other | Source: Ambulatory Visit

## 2020-08-06 VITALS — Wt 190.0 lb

## 2020-08-06 DIAGNOSIS — J449 Chronic obstructive pulmonary disease, unspecified: Secondary | ICD-10-CM

## 2020-08-06 NOTE — Progress Notes (Signed)
Assessment: Patient reported to PR with a blood pressure of 90/50. Patient reported that he was asymptomatic, but barely drank any water today . Water was provided and the forehead SpO2 monitor was applied to the patient. His oxygen saturation read 89% and the HR read 213bpm,. The patient was immediately placed on a heart monitor and his HR was reading 110bpm, which is higher than his normal. Another BP was taken after the patient had water and it read 99/66. We replaced his forehead monitor and got a reading of 99% SpO2 and 90bpm for a HR. Patient has a history of a-fib and reports that he took all of his medications and still felt asymptomatic.       Exercise Modifications: Patient will exercise at a lower level.      Plan: Patient will exercise on a heart monitor, plan to reassess vitals during and after exercise. Encourage patient to keep drinking water before, during and after exercise.

## 2020-08-08 ENCOUNTER — Inpatient Hospital Stay
Admission: RE | Admit: 2020-08-08 | Discharge: 2020-08-08 | Disposition: A | Discharge status: Home / Self Care | Payer: Medicare Other | Source: Ambulatory Visit

## 2020-08-08 VITALS — Ht 67.99 in

## 2020-08-08 DIAGNOSIS — J449 Chronic obstructive pulmonary disease, unspecified: Secondary | ICD-10-CM

## 2020-08-13 ENCOUNTER — Inpatient Hospital Stay
Admission: RE | Admit: 2020-08-13 | Discharge: 2020-08-13 | Disposition: A | Discharge status: Home / Self Care | Payer: Medicare Other | Source: Ambulatory Visit

## 2020-08-13 NOTE — Addendum Note (Signed)
Encounter addended by: Glendale Chard on: 08/13/2020 1:31 PM   Actions taken: Flowsheet data copied forward, Flowsheet accepted

## 2020-08-13 NOTE — Addendum Note (Signed)
Encounter addended by: Glendale Chard on: 08/13/2020 1:38 PM   Actions taken: Flowsheet accepted, Clinical Note Signed

## 2020-08-13 NOTE — Pulmonary Rehab ITP (Signed)
Pulmonary Rehab Individual Treatment Plan    Individual Treatment Plan  Assessment Period:  30 Day Re-evaluation  Session #: 7  Referring Diagnosis: Interstitial lung disease  Date of Event/Onset Date: 10 years ago  Comorbidities: Other, Malignancy, Hypertension (PNA, GVD/NSIP)  First Exercise Session (date): 07/18/20  Program Status: Actively Enrolled    Education Assessment  Barriers to Learning: None  Stages of Change: Action    Fall Risk Score  Fall Risk Score: 6  Fall Risk Category:  low  Comment: gets numbness on bottom of feet which can cause him unsteadiness and unbalance    Medication Compliance  Assessment: Oral Corticosteroids  Education/Intervention: Attend medication class, Medication reconciliation, Medication handouts, Reinforce medication adherence, Educate on proper timing/technique of inhalers   Goals: Daily medication adherence, Patient verbalizes adherence, Patient able to complete teachback  Goals Assessment: Goals partially met  Comment: Patient reports taking all medications as prescribed. No medication changes.    Exercise Assessment  Work Related Physical Requirements: No  Physical Limitation: No  Exercise History: Yes  Mode:  Pulmonary Rehab, Gym Member   Frequency: 6 days/week  Duration: 40-49 minutes  Resistance Training: Yes   Current Peak MET Level: 5.2 (elliptical)    Exercise Prescription  THR: 108-140 (calculated from RHR 77 from Dr office notes from 06/27/20)  Frequency: Pulmonary Rehab 2 days/week, Home 3 days/week  BORG RPE: 11-15  BORG RPD:  3-5  Exercise Time: 40-49 minutes  Mode: Arm Ergometer, NuStep, Walking, Bike, Elliptical, Rower  Goal Peak MET Level: 3.4  Resistance Training: 2-3 days per week, 10-15 repetitions, 1-3 sets to moderate fatigue, Bands, Dumbbells, Chair exercise  Adherence: Adheres to Pulmonary exercise guidelines, Adheres to strength exercise guidelines  Education/Intervention: Exercise Education Class, Home exercise guidelines, Educate on RPE, RPD, THR, warm  up and cool down, Self-monitoring using pulse taking, heart rate monitor and activity tracker, Progress time and intensity when a steady state of HR and RPE occur, Educate on S&S of Pulmonary compromise, Benefit of Exercise Class  Goals: Pulmonary exercise 30-45 minutes 5x week, Strength exercise 2-3 days per week, Increase 6 minute walk by 30 M (98.43 ft), Avoid inactivity  Goal Assessment: Goals partially met  Comment: Patient began strength training per AACVPR guidelines on 07/30/2020. Patient demonstrated proper technique and tolerated all exercises well. Plan to attend exercise prescription and guidelines.    6 Minute Walk Test  Completed Test: Yes, During PR  Distance (in feet): 1031.4  MPH: 1.9  METS: 2.4  Post Test B/P: 124/62  Lowest SpO2: 94  Max BORG RPD: 3-Moderate  O2 Liters: 0    Nutrition  Height: 172.7 cm (5' 7.99")  Weight: 190lbs  BMI (Calculated): 28.9  Waist Circumference: (inches): 47  Body Composition: Overweight BMI 25-29.9  Current Eating Plan: Other (tries to eat a lot of fruits and vegetables, limits red meat intake)  Food Log: Provided (given on first day of exercise)  Hydration: Water, Coffee  Dietician Consult: Consult not yet scheduled  Education/Intervention:  (Pulmonary nutrition and hydration)  Goals: BMI 18.5 - 24.9, Waist (men) <40, (women) <35 inches  Goals Assessment: Goal partially met  Comment: Plan to attend pulmonary nutrition class.    Diabetes  Diabetes: No    Psychosocial/Stress  Assessment: Adequate support system, Positive coping mechanisms  Depression Tool: PHQ-9  Initial Depression Screening Score (PHQ-9 or HADS: 1   Repeat Depression Score:  (at discharge)  Level of Depression Severity: None-Minimal 0-4  COPD Assessment Test (CAT) Score: 11  Impact Level: <10 - low  Barriers to Learning: None   Barriers to Attendance: None  Lives With: Spouse/Partner  Living Characteristics: 2 story, Stairs, Psychologist, sport and exercise  Support System: Spouse/Partner  Transportation Resources: Able to  drive  Work Status: Retired  Tax adviser: Stress management class, Note to MD if survey indicates needs, Stress management/relaxation handouts, Regular physical activity/exercise, Assist patient with identifying stressors, Educate on positive coping mechanisms  Goals: Support system, Positive coping mechanisms, Manageable level of psychosocial stress due to anxiety. depression, anger and hostility, Improvement in psychosocial survey score, Improved quality of life  Goal Assessment: Goal partially met  Comment: Plan to attend stress management class.    O2/SPO2  Assessment: O2 as needed  O2 Rx: 1-2lpm  O2 Delivery Device: Portable O2 concentrator (Inogen)  SpO2 at Rest: 98  SpO2 During Peak Exercise: 94 ( )  Education/Intervention: Monitor SpO2 w/rest and exercise, Educate on hypoxia and risks of hypoxemia, Recognizes s/s of hypoxemia, Pulse oximetry education   Oxygen Titration: N/A  Goals: SpO2 > 90%, Home pulse oximetry education completed  Goals Assessment: Goals met  Comment: 07/30/2020 Pulse Oximetry Guidelines. Patient brought in personal pulse oximeter to compare with ours. Patient demonstrated an understanding of pulse ox use.     Dyspnea  Assessment: Cuing required 50-100%  PFT: Yes  Date of Last PFT: 06/27/20  FVC%: 2.66  FEV-1%: 2.27  FEV-1/FVC%: .85  UCSDSOBQ Score: 5   MMRC: O - I only get breathless with strenous exercise  Education/Intervention: Home recommendations, Pursed lip, diaphragmatic breathing, Positions for relief of dyspnea, Coach for control during exercise tx, Energy conservation, Infection prevention, Weather guidelines, Seeking medical care, Lung anatomy ed, Breathing Technique with stair climbing/descending  Goals: Borg Dyspnea rated max 4-6, Independent with breath control, Decrease dyspnea on exertion   Goal Assessment: Goals partially met      Airway Clearance  Assessment:  Respiratory related hospitalization within past 12 months, Nasal congestion (nasal drainage and  mucous)  Education/Intervention: Educate on exercise and secretion clearance, Respiratory infection prevention, Hydration, Hand hygiene, Flu/Pneumonia vaccine, Self-evaluation of sputum, Educate on reporting: purulent sputum, dyspnea or fatigue, When to call MD, Environmental triggers  Goals: Effective secretion clearance, Demonstrate effective cough, Respiratory infection prevention/management, Exacerbation prevention, Avoid environmental triggers  Goals Assessment:Goal partially met     Sleep  Assessment:  Healthy Sleep Pattern  STOP BANG Score:2  STOP BANG Risk:Low Risk  Neck Circumference: 18.5  Education/Intervention: Education completed on Healthy Sleep Training  Goals: < 17 in men  Goal Assessment: Goals partially met    Comment: Plan to discuss healthy sleep pattern education.    Tobacco Cessation  Assessment: Former tobacco user  Tobacco Use: No  Quite Date:2003   Packs Per Day/Years Smoked: 1/26  Pack Year: 26  Education/Intervention: Other (comment) (no intervention indicated at this time)  Goals: Remain tobacco free  Goal Assessment: Goals met     Comment: Patient reports abstaining from tobacco use.    Patient Stated Goals  Patient Stated Goal 1: Increase lung capacity   Goal Assessment: Progression toward goal   Comment: Patient is able to exercise up to 38 min in PR and does strength training.   Patient Stated Goal 2: Maintain weight  Goal Assessment: Goals partially met    Comment: Patient has lost 2lbs since beginning PR, but has otherwise remained around the same weight.

## 2020-08-15 ENCOUNTER — Ambulatory Visit: Payer: Medicare Other

## 2020-08-20 ENCOUNTER — Inpatient Hospital Stay
Admission: RE | Admit: 2020-08-20 | Discharge: 2020-08-20 | Disposition: A | Discharge status: Home / Self Care | Payer: Medicare Other | Source: Ambulatory Visit

## 2020-08-20 DIAGNOSIS — J849 Interstitial pulmonary disease, unspecified: Secondary | ICD-10-CM

## 2020-08-20 DIAGNOSIS — J449 Chronic obstructive pulmonary disease, unspecified: Secondary | ICD-10-CM

## 2020-08-20 NOTE — Addendum Note (Signed)
Encounter addended by: Glendale Chard on: 08/20/2020 10:28 AM   Actions taken: Visit diagnoses modified, Episode edited, Problem List modified

## 2020-08-22 ENCOUNTER — Inpatient Hospital Stay
Admission: RE | Admit: 2020-08-22 | Discharge: 2020-08-22 | Disposition: A | Discharge status: Home / Self Care | Payer: Medicare Other | Source: Ambulatory Visit

## 2020-08-22 VITALS — Wt 188.0 lb

## 2020-08-22 DIAGNOSIS — J849 Interstitial pulmonary disease, unspecified: Secondary | ICD-10-CM

## 2020-08-29 ENCOUNTER — Ambulatory Visit: Payer: Medicare Other

## 2020-09-03 ENCOUNTER — Ambulatory Visit: Payer: Medicare Other

## 2020-09-05 ENCOUNTER — Ambulatory Visit: Payer: Medicare Other

## 2020-09-10 ENCOUNTER — Inpatient Hospital Stay
Admission: RE | Admit: 2020-09-10 | Discharge: 2020-09-10 | Disposition: A | Discharge status: Home / Self Care | Payer: Medicare Other | Source: Ambulatory Visit | Attending: Critical Care Medicine | Admitting: Critical Care Medicine

## 2020-09-10 VITALS — Ht 67.99 in | Wt 191.0 lb

## 2020-09-10 DIAGNOSIS — J449 Chronic obstructive pulmonary disease, unspecified: Secondary | ICD-10-CM | POA: Insufficient documentation

## 2020-09-10 DIAGNOSIS — J849 Interstitial pulmonary disease, unspecified: Secondary | ICD-10-CM

## 2020-09-10 DIAGNOSIS — C801 Malignant (primary) neoplasm, unspecified: Secondary | ICD-10-CM | POA: Insufficient documentation

## 2020-09-10 DIAGNOSIS — I10 Essential (primary) hypertension: Secondary | ICD-10-CM | POA: Insufficient documentation

## 2020-09-10 DIAGNOSIS — J84111 Idiopathic interstitial pneumonia, not otherwise specified: Secondary | ICD-10-CM | POA: Insufficient documentation

## 2020-09-10 NOTE — Pulmonary Rehab ITP (Signed)
Referring Physician: Tedra Coupe, Ihor Austin, MD  Comment: 30-Day ITP      Pulmonary Rehab Individual Treatment Plan    Individual Treatment Plan  Assessment Period:  30 Day Re-evaluation  Session #: 11  Referring Diagnosis: Interstitial lung disease  Date of Event/Onset Date: 10 years ago  Comorbidities: Other, Malignancy, Hypertension (PNA, GVD/NSIP)  First Exercise Session (date): 07/18/20  Program Status: Actively Enrolled    Education Assessment  Barriers to Learning: None  Stages of Change: Action  Comment: 08/22/2020 Lung Anatomy    Fall Risk Score  Fall Risk Score: 6  Fall Risk Category:  low  Comment: gets numbness on bottom of feet which can cause him unsteadiness and unbalance    Medication Compliance  Assessment: Oral Corticosteroids  Education/Intervention: Attend medication class, Medication reconciliation, Medication handouts, Reinforce medication adherence, Educate on proper timing/technique of inhalers   Goals: Daily medication adherence, Patient verbalizes adherence, Patient able to complete teachback  Goals Assessment: Goals met  Comment: Patient reports taking all medications as prescribed. No medication changes.    Exercise Assessment  Work Related Physical Requirements: No  Physical Limitation: No  Exercise History: Yes  Mode:  Pulmonary Rehab, Home equipment  Frequency: 6 days/week  Duration: 30-39 minutes  Resistance Training: Yes   Current Peak MET Level: 5.2 (elliptical)    Exercise Prescription  THR: 108-140 (calculated from RHR 77 from Dr office notes from 06/27/20)  Frequency: Pulmonary Rehab 2 days/week, Home 3 days/week  BORG RPE: 11-15  BORG RPD:  3-5  Exercise Time: 40-49 minutes  Mode: Arm Ergometer, NuStep, Walking, Bike, Elliptical, Rower  Goal Peak MET Level: 3.4  Resistance Training: 2-3 days per week, 10-15 repetitions, 1-3 sets to moderate fatigue, Bands, Dumbbells, Chair exercise  Adherence: Adheres to Pulmonary exercise guidelines, Adheres to strength exercise  guidelines  Education/Intervention: Exercise Education Class, Home exercise guidelines, Educate on RPE, RPD, THR, warm up and cool down, Self-monitoring using pulse taking, heart rate monitor and activity tracker, Progress time and intensity when a steady state of HR and RPE occur, Educate on S&S of Pulmonary compromise, Benefit of Exercise Class  Goals: Pulmonary exercise 30-45 minutes 5x week, Strength exercise 2-3 days per week, Increase 6 minute walk by 30 M (98.43 ft), Avoid inactivity  Goal Assessment: Goals met  Comment: Patient reports exercising 4 days a week at his community gym outside of [place]. Patient is consistent with strength and cardiovascular exercises.    6 Minute Walk Test  Completed Test: Yes, During PR  Distance (in feet): 1031.4  MPH: 1.9  METS: 2.4  Post Test B/P: 124/62  Lowest SpO2: 94  Max BORG RPD: 3-Moderate  O2 Liters: 0    Nutrition  Height: 172.7 cm (5' 7.99")  Weight: 191lbs  BMI (Calculated): 29  Waist Circumference: (inches): 47  Body Composition: Overweight BMI 25-29.9  Current Eating Plan: Other (tries to eat a lot of fruits and vegetables, limits red meat intake)  Food Log: Provided (given on first day of exercise)  Hydration: Water, Coffee  Dietician Consult: Consult not yet scheduled  Education/Intervention:  (Pulmonary nutrition and hydration)  Goals: BMI 18.5 - 24.9, Waist (men) <40, (women) <35 inches  Goals Assessment: Goal partially met  Comment: Patient attended pulmonary nutrititon class on 09/10/2020.    Diabetes  Diabetes: No    Psychosocial/Stress  Assessment: Adequate support system, Positive coping mechanisms  Depression Tool: PHQ-9  Initial Depression Screening Score (PHQ-9 or HADS: 1   Repeat Depression Score:  (at  discharge)  Level of Depression Severity: None-Minimal 0-4  COPD Assessment Test (CAT) Score: 11  Impact Level: <10 - low  Barriers to Learning: None   Barriers to Attendance: None  Lives With: Spouse/Partner  Living Characteristics: 2 story, Stairs,  Psychologist, sport and exercise  Support System: Spouse/Partner  Transportation Resources: Able to drive  Work Status: Retired  Tax adviser: Stress management class, Note to MD if survey indicates needs, Stress management/relaxation handouts, Regular physical activity/exercise, Assist patient with identifying stressors, Educate on positive coping mechanisms  Goals: Support system, Positive coping mechanisms, Manageable level of psychosocial stress due to anxiety. depression, anger and hostility, Improvement in psychosocial survey score, Improved quality of life  Goal Assessment: Goal partially met  Comment: Plan to attend stress management class.    O2/SPO2  Assessment: O2 as needed  O2 Rx: 1-2lpm  O2 Delivery Device: Portable O2 concentrator (Inogen)  SpO2 at Rest: 98  SpO2 During Peak Exercise: 94 ( )  Education/Intervention: Monitor SpO2 w/rest and exercise, Educate on hypoxia and risks of hypoxemia, Recognizes s/s of hypoxemia, Pulse oximetry education   Oxygen Titration: N/A  Goals: SpO2 > 90%, Home pulse oximetry education completed  Goals Assessment: Goals met  Comment: 07/30/2020 Pulse Oximetry Guidelines. Patient brought in personal pulse oximeter to compare with ours. Patient demonstrated an understanding of pulse ox use.     Dyspnea  Assessment: Cuing required 50-100%  PFT: Yes  Date of Last PFT: 06/27/20  FVC%: 2.66  FEV-1%: 2.27  FEV-1/FVC%: .85  UCSDSOBQ Score: 5   MMRC: O - I only get breathless with strenous exercise  Education/Intervention: Home recommendations, Pursed lip, diaphragmatic breathing, Positions for relief of dyspnea, Coach for control during exercise tx, Energy conservation, Infection prevention, Weather guidelines, Seeking medical care, Lung anatomy ed, Breathing Technique with stair climbing/descending  Goals: Borg Dyspnea rated max 4-6, Independent with breath control, Decrease dyspnea on exertion   Goal Assessment: Goals partially met    Comment: Pursed Lip and Diaphragmatic Breathing Education  on 08/01/2020.    Airway Clearance  Assessment:  Respiratory related hospitalization within past 12 months, Nasal congestion (nasal drainage and mucous)  Education/Intervention: Educate on exercise and secretion clearance, Respiratory infection prevention, Hydration, Hand hygiene, Flu/Pneumonia vaccine, Self-evaluation of sputum, Educate on reporting: purulent sputum, dyspnea or fatigue, When to call MD, Environmental triggers  Goals: Effective secretion clearance, Demonstrate effective cough, Respiratory infection prevention/management, Exacerbation prevention, Avoid environmental triggers  Goals Assessment:Goal partially met     Sleep  Assessment:  Healthy Sleep  STOP BANG Score:2  STOP BANG Risk:Low Risk  Neck Circumference: 18.5  Education/Intervention: Education completed on Healthy Sleep Training  Goals: < 17 in men  Goal Assessment: Goals partially met    Comment: Plan to discuss healthy sleep pattern education.    Tobacco Cessation  Assessment: Former tobacco user  Tobacco Use: No  Quite Date:2003   Packs Per Day/Years Smoked: 1/26  Pack Year: 26  Education/Intervention: Other (comment) (no intervention indicated at this time)  Goals: Remain tobacco free  Goal Assessment: Goals met     Comment: Patient reports that he remains tobacco free.    Patient Stated Goals  Patient Stated Goal 1: Increase lung capacity   Goal Assessment: Goals partially met   Comment: Patient reports that he is able to tolerate exercise for a longer period of time and is exercising consistently.   Patient Stated Goal 2: Maintain weight  Goal Assessment: Goals met    Comment: Patient has maintained his weight consistently through the program.

## 2020-09-12 ENCOUNTER — Inpatient Hospital Stay
Admission: RE | Admit: 2020-09-12 | Discharge: 2020-09-12 | Disposition: A | Discharge status: Home / Self Care | Payer: Medicare Other | Source: Ambulatory Visit

## 2020-09-12 ENCOUNTER — Ambulatory Visit: Payer: Medicare Other

## 2020-09-12 DIAGNOSIS — J849 Interstitial pulmonary disease, unspecified: Secondary | ICD-10-CM

## 2020-09-17 ENCOUNTER — Inpatient Hospital Stay
Admission: RE | Admit: 2020-09-17 | Discharge: 2020-09-17 | Disposition: A | Discharge status: Home / Self Care | Payer: Medicare Other | Source: Ambulatory Visit

## 2020-09-17 VITALS — BP 110/60 | HR 68 | Resp 14 | Ht 67.99 in | Wt 190.0 lb

## 2020-09-17 DIAGNOSIS — J849 Interstitial pulmonary disease, unspecified: Secondary | ICD-10-CM

## 2020-09-19 ENCOUNTER — Inpatient Hospital Stay
Admission: RE | Admit: 2020-09-19 | Discharge: 2020-09-19 | Disposition: A | Discharge status: Home / Self Care | Payer: Medicare Other | Source: Ambulatory Visit

## 2020-09-19 VITALS — Ht 67.99 in | Wt 193.0 lb

## 2020-09-19 DIAGNOSIS — J849 Interstitial pulmonary disease, unspecified: Secondary | ICD-10-CM

## 2020-09-19 NOTE — Addendum Note (Signed)
Encounter addended by: Waldron Labs, RT on: 09/19/2020 10:18 AM   Actions taken: Charge Capture section accepted

## 2020-09-21 ENCOUNTER — Ambulatory Visit: Payer: Medicare Other

## 2020-09-24 ENCOUNTER — Ambulatory Visit: Payer: Medicare Other

## 2020-09-26 ENCOUNTER — Ambulatory Visit: Payer: Medicare Other

## 2020-10-02 NOTE — Pulmonary Rehab ITP (Signed)
Referring Physician: Tedra Coupe, Ihor Austin, MD  Comment:  Discharge Evaluation        Pulmonary Rehab Individual Treatment Plan    Individual Treatment Plan  Assessment Period:  Discharge Evaluation  Session #: 14  Referring Diagnosis: Interstitial lung disease  Date of Event/Onset Date: 10 years ago  Comorbidities: Other, Malignancy, Hypertension (PNA, GVD/NSIP)  First Exercise Session (date): 07/18/20  Program Status: Completed    Education Assessment  Barriers to Learning: None  Stages of Change: Maintenance  Comment: 08/22/2020 Lung Anatomy    Fall Risk Score  Fall Risk Score: 6  Fall Risk Category:  low  Comment: gets numbness on bottom of feet which can cause him unsteadiness and unbalance    Medication Compliance  Assessment: Oral Corticosteroids  Education/Intervention: Attend medication class, Medication reconciliation, Medication handouts, Reinforce medication adherence, Educate on proper timing/technique of inhalers   Goals: Daily medication adherence, Patient verbalizes adherence, Patient able to complete teachback  Goals Assessment: Goals met  Comment: Patient reports taking all medications as prescribed.    Exercise Assessment  Work Related Physical Requirements: No  Physical Limitation: No  Exercise History: Yes  Mode:  Home Equipment  Frequency: 6 days/week  Duration: 30-39 minutes  Resistance Training: Yes   Current Peak MET Level: 5.2 (elliptical)    Exercise Prescription  THR: 108-140 (calculated from RHR 77 from Dr office notes from 06/27/20)  Frequency: Home 5 days/week  BORG RPE: 11-15  BORG RPD:  3-5  Exercise Time: 40-49 minutes  Mode: Walking, Bike, Elliptical, Rower  Goal Peak MET Level: 4.3  Resistance Training: 2-3 days per week, 10-15 repetitions, 1-3 sets to moderate fatigue, Bands, Dumbbells, Chair exercise  Adherence: Adheres to Pulmonary exercise guidelines, Adheres to strength exercise guidelines  Education/Intervention: Exercise Education Class, Home exercise guidelines, Educate on RPE,  RPD, THR, warm up and cool down, Self-monitoring using pulse taking, heart rate monitor and activity tracker, Progress time and intensity when a steady state of HR and RPE occur, Educate on S&S of Pulmonary compromise, Benefit of Exercise Class  Goals: Pulmonary exercise 30-45 minutes 5x week, Strength exercise 2-3 days per week, Increase 6 minute walk by 30 M (98.43 ft), Avoid inactivity  Goal Assessment: Goals met  Comment: Patient reports exercising 4 days a week at his community gym outside of [place]. Patient is consistent with strength and cardiovascular exercises.    6 Minute Walk Test  Completed Test: Yes, During PR, Post Tests  Distance (in feet): 1436  MPH: 2.7  METS: 3.1  Post Test B/P: 130/70  Lowest SpO2: 93  Max BORG RPD: 0.5-Very, very slight (just noticeable)  O2 Liters: 0    Nutrition  Height: 172.7 cm (5' 7.99")  Weight: 193lbs  BMI (Calculated): 29.3  Waist Circumference: (inches): 47  Body Composition: Overweight BMI 25-29.9  Current Eating Plan: Other (tries to eat a lot of fruits and vegetables, limits red meat intake)  Food Log: Provided  Hydration: Water, Coffee  Dietician Consult: Consult not yet scheduled  Education/Intervention:  (Pulmonary nutrition and hydration)  Goals: BMI 18.5 - 24.9, Waist (men) <40, (women) <35 inches  Goals Assessment: Goal partially met  Comment: Patient attended pulmonary nutrititon class on 09/10/2020.    Diabetes  Diabetes: No    Psychosocial/Stress  Assessment: Adequate support system, Positive coping mechanisms  Depression Tool: PHQ-9  Initial Depression Screening Score (PHQ-9 or HADS: 1   Repeat Depression Score: 2  Level of Depression Severity: None-Minimal 0-4  COPD Assessment Test (CAT)  Score: 10  Impact Level: <10 - low  Barriers to Learning: None   Barriers to Attendance: None  Lives With: Spouse/Partner  Living Characteristics: 2 story, Stairs, Psychologist, sport and exercise  Support System: Spouse/Partner  Transportation Resources: Able to drive  Work Status:  Retired  Tax adviser: Stress management class, Note to MD if survey indicates needs, Stress management/relaxation handouts, Regular physical activity/exercise, Assist patient with identifying stressors, Educate on positive coping mechanisms  Goals: Support system, Positive coping mechanisms, Manageable level of psychosocial stress due to anxiety. depression, anger and hostility, Improvement in psychosocial survey score, Improved quality of life  Goal Assessment: Goal met    O2/SPO2  Assessment: O2 as needed  O2 Rx: 1-2lpm  O2 Delivery Device: Portable O2 concentrator (Inogen)  SpO2 at Rest: 98  SpO2 During Peak Exercise: 94 ( )  Education/Intervention: Monitor SpO2 w/rest and exercise, Educate on hypoxia and risks of hypoxemia, Recognizes s/s of hypoxemia, Pulse oximetry education   Oxygen Titration: N/A  Goals: SpO2 > 90%, Home pulse oximetry education completed  Goals Assessment: Goals met  Comment: 07/30/2020 Pulse Oximetry Guidelines. Patient brought in personal pulse oximeter to compare with ours. Patient demonstrated an understanding of pulse ox use.     Dyspnea  Assessment: Uses dyspnea control  PFT: Yes  Date of Last PFT: 06/27/20  FVC%: 2.66  FEV-1%: 2.27  FEV-1/FVC%: .85  UCSDSOBQ Score: 5   MMRC: O - I only get breathless with strenous exercise  Education/Intervention: Home recommendations, Pursed lip, diaphragmatic breathing, Positions for relief of dyspnea, Coach for control during exercise tx, Energy conservation, Infection prevention, Weather guidelines, Seeking medical care, Lung anatomy ed, Breathing Technique with stair climbing/descending  Goals: Borg Dyspnea rated max 4-6, Independent with breath control, Decrease dyspnea on exertion   Goal Assessment: Goals met    Comment: Pursed Lip and Diaphragmatic Breathing Education on 08/01/2020.    Airway Clearance  Assessment:  Respiratory related hospitalization within past 12 months, Nasal congestion (nasal drainage and  mucous)  Education/Intervention: Educate on exercise and secretion clearance, Respiratory infection prevention, Hydration, Hand hygiene, Flu/Pneumonia vaccine, Self-evaluation of sputum, Educate on reporting: purulent sputum, dyspnea or fatigue, When to call MD, Environmental triggers  Goals: Effective secretion clearance, Demonstrate effective cough, Respiratory infection prevention/management, Exacerbation prevention, Avoid environmental triggers  Goals Assessment:Goal met     Sleep  Assessment:  Healthy Sleep Pattern  STOP BANG Score:2  STOP BANG Risk:Low Risk  Neck Circumference: 18.5  Education/Intervention: Education completed on Healthy Sleep Training  Goals: < 17 in men  Goal Assessment: Goals partially met      Tobacco Cessation  Assessment: Former tobacco user  Tobacco Use: No  Quite Date:2003   Packs Per Day/Years Smoked: 1/26  Pack Year: 26  Education/Intervention: Other (comment) (no intervention indicated at this time)  Goals: Remain tobacco free  Goal Assessment: Goals met     Comment: Patient reports that he remains tobacco free.    Patient Stated Goals  Patient Stated Goal 1: Increase lung capacity   Goal Assessment: Goals met   Comment: Patient is able to tolerate at least 40 min of exercise up to a 5.2 MET level.   Patient Stated Goal 2: Maintain weight  Goal Assessment: Goals met    Comment: Patient has maintained his weight consistently through the program.

## 2020-10-02 NOTE — Addendum Note (Signed)
Encounter addended by: Glendale Chard on: 10/02/2020 3:35 PM   Actions taken: Flowsheet data copied forward, Flowsheet accepted, Clinical Note Signed, Letter saved

## 2020-10-03 ENCOUNTER — Telehealth: Payer: Self-pay

## 2020-10-03 ENCOUNTER — Ambulatory Visit: Payer: Medicare Other

## 2020-10-03 NOTE — Telephone Encounter (Signed)
Called patient and got voicemail. Left a message letting patient know that he was discharged from pulmonary rehab.

## 2021-07-22 ENCOUNTER — Encounter: Payer: Self-pay | Admitting: Physical Therapy

## 2021-07-22 ENCOUNTER — Ambulatory Visit: Payer: Medicare Other | Admitting: Physical Therapy

## 2021-07-22 ENCOUNTER — Ambulatory Visit: Payer: Medicare Other | Attending: Neurology | Admitting: Physical Therapy

## 2021-07-22 DIAGNOSIS — M6281 Muscle weakness (generalized): Secondary | ICD-10-CM | POA: Diagnosis present

## 2021-07-22 DIAGNOSIS — R2681 Unsteadiness on feet: Secondary | ICD-10-CM | POA: Insufficient documentation

## 2021-07-22 DIAGNOSIS — R208 Other disturbances of skin sensation: Secondary | ICD-10-CM | POA: Insufficient documentation

## 2021-07-22 NOTE — Therapy (Signed)
[facility] ?[place], [place], [address] ?Phone: [phone]   Fax:  [phone] ? ?Physical Therapy Evaluation ? ?Patient Details  ?Name: Peter Greene ?MRN: [id number] ?Date of Birth: 1940/08/11 ?Referring Provider (PT): Fleet Contras ? ? ?Encounter Date: 07/22/2021 ? ? PT End of Session - 07/22/21 0848   ? ? Visit Number 1   ? Number of Visits 13   ? Date for PT Re-Evaluation 09/02/21   ? Authorization Type MCR and Aetna   ? Authorization Time Period 07/22/21 to 09/02/21   ? Progress Note Due on Visit 10   ? PT Start Time 0805   ? PT Stop Time [phone]   ? PT Time Calculation (min) 38 min   ? Activity Tolerance Patient tolerated treatment well   ? Behavior During Therapy [facility] for tasks assessed/performed   ? ?  ?  ? ?  ? ? ?History reviewed. No pertinent past medical history. ? ?History reviewed. No pertinent surgical history. ? ?There were no vitals filed for this visit. ? ? ? Subjective Assessment - 07/22/21 0808   ? ? 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 I did have radiation and chemo I don't know if this neuropathy is a result of that either. 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 The neuropathy has been around for about a year and a half. Walking in my bare feet is not comfortable so I wear shoes with inserts. Sometimes I have an ache in my L lower back   ? 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 Currently in Pain? No/denies   ? ?  ?  ? ?  ? ? ? ? ? [facility] PT Assessment - 07/22/21 0001   ? ?  ? Assessment  ? Medical Diagnosis lumbar radiculopathy   ? Referring Provider (PT) Fleet Contras   ? Onset Date/Surgical Date --   chronic  ? Next MD Visit none with Dr. Delfino Lovett   ? Prior Therapy PT in the past for pulmonary care,  TK[facility]   ?  ? Precautions  ? Precautions None   ?  ? Restrictions  ? Weight Bearing Restrictions No   ?  ? Balance Screen  ? Has the patient fallen in the past 6 months No   ? Has the patient had a decrease in activity level because of a fear of falling?  No   ? Is the patient reluctant to leave their home because of a fear of falling?  No   ?  ? Prior Function  ? Level of Independence Independent;Independent with basic ADLs   ? Vocation Retired   ? Leisure golf   ?  ? ROM / Strength  ? AROM / PROM / Strength Strength;AROM   ?  ? AROM  ? AROM Assessment Site Lumbar   ? Lumbar Flexion mild limitation   ? Lumbar Extension mild limitation   ? Lumbar - Right Side Bend moderate limitation   ? Lumbar - Left Side Bend moderate limitation   ?  ? Strength  ? Strength Assessment Site Hip;Knee;Ankle   ? Right/Left Hip Right;Left   ? Right Hip Flexion 4-/5   ? Right Hip Extension 3+/5   ? Right  Hip ABduction 3+/5   ? Left Hip Flexion 4-/5   ? Left Hip Extension 3+/5   ? Left Hip ABduction 3+/5   ? Right/Left Knee Right;Left   ? Right Knee Flexion 4+/5   ? Right Knee Extension 5/5   ? Left Knee Flexion 4+/5   ? Left Knee Extension 5/5   ? Right/Left Ankle Left;Right   ? Right Ankle Dorsiflexion 5/5   ? Left Ankle Dorsiflexion 5/5   ?  ? Standardized Balance Assessment  ? Standardized Balance Assessment Berg Balance Test;Dynamic Gait Index   ?  ? Berg Balance Test  ? Sit to Stand Able to stand without using hands and stabilize independently   ? Standing Unsupported Able to stand safely 2 minutes   ? Sitting with Back Unsupported but Feet Supported on Floor or Stool Able to sit safely and securely 2 minutes   ? Stand to Sit Sits safely with minimal use of hands   ? Transfers Able to transfer safely, minor use of hands   ? Standing Unsupported with Eyes Closed Able to stand 10 seconds safely   ? Standing Unsupported with Feet Together Able to place feet together independently and stand 1 minute safely   ? From Standing, Reach Forward  with Outstretched Arm Can reach confidently >25 cm (10")   ? From Standing Position, Pick up Object from [place] to pick up shoe safely and easily   ? From Standing Position, Turn to Look Behind Over each Shoulder Looks behind from both sides and weight shifts well   ? Turn 360 Degrees Able to turn 360 degrees safely in 4 seconds or less   ? Standing Unsupported, Alternately Place Feet on Step/Stool Able to stand independently and safely and complete 8 steps in 20 seconds   ? Standing Unsupported, One Foot in Front Able to plae foot ahead of the other independently and hold 30 seconds   ? Standing on One Leg Unable to try or needs assist to prevent fall   ? Total Score 51   ?  ? Dynamic Gait Index  ? Level Surface Normal   ? Change in Gait Speed Mild Impairment   ? Gait with Horizontal Head Turns Mild Impairment   ? Gait with Vertical Head Turns Mild Impairment   ? Gait and Pivot Turn Mild Impairment   ? Step Over Obstacle Normal   ? Step Around Obstacles Mild Impairment   ? Steps Mild Impairment   ? Total Score 18   ? ?  ?  ? ?  ? ? ? ? ? ? ? ? ? ? ? ? ? ?Objective measurements completed on examination: See above findings.  ? ? ? ? ? ? ? ? ? ? ? ? ? ? PT Education - 07/22/21 0847   ? ? Education Details mechanisms of neuropathy, nerve healing times, mechanism of chemo/radiation on neuropathy, and how PT can help to manage neuropathy. Unfortunately from exam I do think neuropathy may be coming from past chemo/radiation treatments rather than a pure lumbar neuropathy   ? Person(s) Educated Patient   ? Methods Explanation   ? Comprehension Verbalized understanding   ? ?  ?  ? ?  ? ? ? PT Short Term Goals - 07/22/21 0850   ? ?  ? PT SHORT TERM GOAL #1  ? Title Will be compliant with appropriate progressive HEP   ? Time 3   ? Period Weeks   ? Status New   ?  Target Date 08/12/21   ?  ? PT SHORT TERM GOAL #2  ? Title Will be able to complete 5x sit to stand with no UEs in 13 seconds or less   ? Time 3   ? Period Weeks    ? Status New   ?  ? PT SHORT TERM GOAL #3  ? Title Will be able to explain at least 3 techniques to maintain safety of skin integrity and foot/skin protection in face of ongoing neuropathy   ? Time 3   ? Period Weeks   ? Status New   ? ?  ?  ? ?  ? ? ? ? PT Long Term Goals - 07/22/21 0853   ? ?  ? PT LONG TERM GOAL #1  ? Title MMT to improve by at least one grade in all weak groups   ? Time 6   ? Period Weeks   ? Status New   ? Target Date 09/02/21   ?  ? PT LONG TERM GOAL #2  ? Title Will score 100% on Berg balance test and at least 22/24 on DGI to show improved functional balance skills   ? Time 6   ? Period Weeks   ? Status New   ?  ? PT LONG TERM GOAL #3  ? Title Will be compliant with advanced customized gym program prior to DC   ? Time 6   ? Period Weeks   ? Status New   ? ?  ?  ? ?  ? ? ? ? ? ? ? ? ? Plan - 07/22/21 0848   ? ? Clinical Impression Statement Mr. Upshore arrives today doing OK, he is really focused on trying to manage and reverse his neuropathy. He is extremely curious about laser, light, TENS, and other modality based therapies. Exam reveals significant functional muscle weakness, impaired functional balance, and limited lumbar ROM. I spent a significant amount of time explaining mechanisms of neuropathy, nerve healing times, mechanism of chemo/radiation on neuropathy, and how PT can help to manage neuropathy. Unfortunately from exam I do think neuropathy may be coming from past chemo/radiation treatments rather than a pure lumbar neuropathy. I think we can help him with general strength, balance, and in managing neuropathy moving forward.   ? Personal Factors and Comorbidities Time since onset of injury/illness/exacerbation;Comorbidity 3+   ? 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 Examination-Activity Limitations Locomotion Level;Transfers   ? Examination-Participation Restrictions Community Activity;[employer] Work   ?  Stability/Clinical Decision Making Evolving/Moderate complexity   ? Clinical Decision Making Moderate   ? Rehab Potential Good   ? PT Frequency 2x / week   ? PT Duration 6 weeks   ? PT Treatment/Interventions ADLs/Se

## 2021-07-26 ENCOUNTER — Ambulatory Visit: Payer: Medicare Other | Admitting: Physical Therapy

## 2021-07-26 ENCOUNTER — Encounter: Payer: Self-pay | Admitting: Physical Therapy

## 2021-07-26 DIAGNOSIS — R208 Other disturbances of skin sensation: Secondary | ICD-10-CM | POA: Diagnosis not present

## 2021-07-26 DIAGNOSIS — M6281 Muscle weakness (generalized): Secondary | ICD-10-CM

## 2021-07-26 DIAGNOSIS — R2681 Unsteadiness on feet: Secondary | ICD-10-CM

## 2021-07-26 NOTE — Therapy (Signed)
[facility] ?[address] ?[place], [place], [address] ?Phone: [phone]   Fax:  [phone] ? ?Physical Therapy Treatment ? ?Patient Details  ?Name: Peter Greene ?MRN: [id number] ?Date of Birth: [birth year] ?Referring Provider (PT): Fleet Contras ? ? ?Encounter Date: 07/26/2021 ? ? PT End of Session - 07/26/21 0939   ? ? Visit Number 2   ? Number of Visits 13   ? Date for PT Re-Evaluation 09/02/21   ? Authorization Type MCR and Aetna   ? Authorization Time Period 07/22/21 to 09/02/21   ? Progress Note Due on Visit 10   ? PT Start Time 6440   started at [facility] but moist heat not included in billing  ? PT Stop Time [phone]   ? PT Time Calculation (min) 30 min   ? Activity Tolerance Patient tolerated treatment well   ? Behavior During Therapy [facility] for tasks assessed/performed   ? ?  ?  ? ?  ? ? ?History reviewed. No pertinent past medical history. ? ?History reviewed. No pertinent surgical history. ? ?There were no vitals filed for this visit. ? ? Subjective Assessment - 07/26/21 0850   ? ? Subjective I hurt my back last night, I thought I was on the first step of a step stool and I was really on the second step; I stepped down like I was on the first step and ended up falling and wrenching my back. I didn't hit my head, I don't have any areas that make me worry about a broken bone, nothing else feels sprained or strained. This happened at 6pm last night. Rest, hot shower, and ice have been helping the most so far.   ? 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 Currently in Pain? Yes   ? Pain Score 6    ? Pain Location Back   ? Pain Orientation Lower;Right;Left   ? Pain Descriptors / Indicators Aching;Dull   ? Pain Type Acute pain   ? Pain Radiating Towards none   ? Pain Onset Yesterday   ? Pain Frequency Constant   ? Aggravating Factors  unsure, "nothing that I know"   ? Pain Relieving Factors hot water, ice, rest   ? ?  ?  ? ?   ? ? ? ? ? ? ? ? ? ? ? ? ? ? ? ? ? ? ? ? [facility] Adult PT Treatment/Exercise - 07/26/21 0001   ? ?  ? Exercises  ? Exercises Lumbar   ?  ? Lumbar Exercises: Stretches  ? Single Knee to Chest Stretch Right;Left;5 reps;10 seconds   ? Lower Trunk Rotation 5 reps   ? Lower Trunk Rotation Limitations 5 second holds   ? Piriformis Stretch Right;Left;2 reps;30 seconds   ? ?  ?  ? ?  ? ? ? ? ? ? ? ? ? ? PT Education - 07/26/21 0938   ? ? Education Details healing/recovery process of acute injury, use of moist heat at home 3-5 x/day 10-15 minutes at a time   ? Person(s) Educated Patient   ? Methods Explanation   ? Comprehension Verbalized understanding   ? ?  ?  ? ?  ? ? ? PT Short Term Goals - 07/22/21 0850   ? ?  ? PT SHORT TERM GOAL #1  ? Title Will be compliant with appropriate progressive HEP   ? Time 3   ? Period Weeks   ? Status New   ?  Target Date 08/12/21   ?  ? PT SHORT TERM GOAL #2  ? Title Will be able to complete 5x sit to stand with no UEs in 13 seconds or less   ? Time 3   ? Period Weeks   ? Status New   ?  ? PT SHORT TERM GOAL #3  ? Title Will be able to explain at least 3 techniques to maintain safety of skin integrity and foot/skin protection in face of ongoing neuropathy   ? Time 3   ? Period Weeks   ? Status New   ? ?  ?  ? ?  ? ? ? ? PT Long Term Goals - 07/22/21 0853   ? ?  ? PT LONG TERM GOAL #1  ? Title MMT to improve by at least one grade in all weak groups   ? Time 6   ? Period Weeks   ? Status New   ? Target Date 09/02/21   ?  ? PT LONG TERM GOAL #2  ? Title Will score 100% on Berg balance test and at least 22/24 on DGI to show improved functional balance skills   ? Time 6   ? Period Weeks   ? Status New   ?  ? PT LONG TERM GOAL #3  ? Title Will be compliant with advanced customized gym program prior to DC   ? Time 6   ? Period Weeks   ? Status New   ? ?  ?  ? ?  ? ? ? ? ? ? ? ? Plan - 07/26/21 0939   ? ? Clinical Impression Statement Mr. Biggins arrives today doing OK, he fell off of a step ladder  the other day and injured his back; did not hit his head and no other acute pains that would concern me for an acute sprain or fracture. Educated on healing/recovery process of acute injury, then we had him on some moist heat and otherwise worked on gentle stretching and strengthening to help counter pain from muscle guarding. Hopefully interventions today helped him to feel a little better.   ? Personal Factors and Comorbidities Time since onset of injury/illness/exacerbation;Comorbidity 3+   ? 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 Examination-Activity Limitations Locomotion Level;Transfers   ? Examination-Participation Restrictions Community Activity;Valla Leaver Work   ? Stability/Clinical Decision Making Evolving/Moderate complexity   ? Clinical Decision Making Moderate   ? Rehab Potential Good   ? PT Frequency 2x / week   ? PT Duration 6 weeks   ? PT Treatment/Interventions ADLs/Self Care Home Management;Cryotherapy;Moist Heat;Ultrasound;Gait training;Stair training;Functional mobility training;Therapeutic activities;Therapeutic exercise;Balance training;Neuromuscular re-education;Patient/family education;Manual techniques;Passive range of motion;Dry needling;Energy conservation;Taping   ? PT Next Visit Plan how is his back feeling after the fall? tx as appropriate   ? PT Home Exercise Plan lumbar rotation and SKTC   ? Consulted and Agree with Plan of Care Patient   ? ?  ?  ? ?  ? ? ?Patient will benefit from skilled therapeutic intervention in order to improve the following deficits and impairments:  Decreased coordination, Increased fascial restricitons, Decreased activity tolerance, Pain, Decreased balance, Hypomobility, Impaired flexibility, Improper body mechanics, Decreased mobility, Decreased strength, Postural dysfunction ? ?Visit Diagnosis: ?Other disturbances of skin sensation ? ?Muscle weakness (generalized) ? ?Unsteadiness on  feet ? ? ? ? ?Problem List ?There are no problems to display for this patient. ? ?Ann Lions PT, DPT, PN2  ? ?  Supplemental Physical Therapist ?[facility]  ? ? ? ? ? ?[facility] ?[address] ?[address] ?[place], [place], [address] ?Phone: [phone]   Fax:  [phone] ? ?Name: Cole Eastridge ?MRN: [id number] ?Date of Birth: 04/03/1940 ? ? ? ?

## 2021-07-29 DIAGNOSIS — J189 Pneumonia, unspecified organism: Secondary | ICD-10-CM

## 2021-07-29 HISTORY — DX: Pneumonia, unspecified organism: J18.9

## 2021-08-02 ENCOUNTER — Ambulatory Visit: Payer: Medicare Other | Attending: Neurology | Admitting: Physical Therapy

## 2021-08-02 DIAGNOSIS — M6281 Muscle weakness (generalized): Secondary | ICD-10-CM | POA: Insufficient documentation

## 2021-08-02 DIAGNOSIS — R208 Other disturbances of skin sensation: Secondary | ICD-10-CM | POA: Diagnosis present

## 2021-08-02 DIAGNOSIS — R2681 Unsteadiness on feet: Secondary | ICD-10-CM | POA: Insufficient documentation

## 2021-08-02 NOTE — Therapy (Signed)
[facility] ?[facility] ?[address] ?[place], [place], [address] ?Phone: [phone]   Fax:  [phone] ? ?Physical Therapy Treatment ? ?Patient Details  ?Name: Peter Greene ?MRN: [id number] ?Date of Birth: March 17, 1941 ?Referring Provider (PT): Fleet Contras ? ? ?Encounter Date: 08/02/2021 ? ? PT End of Session - 08/02/21 0931   ? ? Visit Number 3   ? Number of Visits 13   ? Date for PT Re-Evaluation 09/02/21   ? Authorization Type MCR and Aetna   ? Authorization Time Period 07/22/21 to 09/02/21   ? PT Start Time [phone]   ? PT Stop Time 6415   ? PT Time Calculation (min) 45 min   ? ?  ?  ? ?  ? ? ?No past medical history on file. ? ?No past surgical history on file. ? ?There were no vitals filed for this visit. ? ? Subjective Assessment - 08/02/21 0851   ? ? Subjective back is getting better from fall- played golf earlier in week a couple times.. main thing is strength   ? Currently in Pain? Yes   ? Pain Score 4    ? Pain Location Back   ? ?  ?  ? ?  ? ? ? ? ? ? ? ? ? ? ? ? ? ? ? ? ? ? ? ? [facility] Adult PT Treatment/Exercise - 08/02/21 0001   ? ?  ? Lumbar Exercises: Aerobic  ? Nustep L 5 44mn   ?  ? Lumbar Exercises: Machines for Strengthening  ? Cybex Lumbar Extension black tband 2 sets 10 flex and ext   ? Cybex Knee Extension 2 sets 10  10#   ? Cybex Knee Flexion 2 sets 10 20#   ? Other Lumbar Machine Exercise lat pull and row 20 # 2 sets 10   ?  ? Lumbar Exercises: Standing  ? Row --   ? Shoulder Extension --   ?  ? Lumbar Exercises: Seated  ? Sit to Stand 20 reps   with wt ball  ?  ? Lumbar Exercises: Supine  ? Clam 20 reps   red tband  ? Bridge Non-compliant;15 reps   plus KTC and obl ( had to bring ball under knees as under feet increased pain)  ? Other Supine Lumbar Exercises ball squeeze 15 x   ?  ? Manual Therapy  ? Manual Therapy Passive ROM   ? Manual therapy comments BIL HS very tight   ? Passive ROM LE and trunk   ? ?  ?  ? ?  ? ? ? ? ? ? ? ? ? ? PT Education -  08/02/21 0929   ? ? Education Details instructed in seated HS stretching   ? Person(s) Educated Patient   ? Methods Explanation;Demonstration   ? Comprehension Verbalized understanding;Returned demonstration   ? ?  ?  ? ?  ? ? ? PT Short Term Goals - 08/02/21 0923   ? ?  ? PT SHORT TERM GOAL #1  ? Title Will be compliant with appropriate progressive HEP   ? Baseline doing initial HEP -stretching   ? Status Partially Met   ?  ? PT SHORT TERM GOAL #2  ? Title Will be able to complete 5x sit to stand with no UEs in 13 seconds or less   ? Status Achieved   ?  ? PT SHORT TERM GOAL #3  ? Title Will be able to explain at least 3  techniques to maintain safety of skin integrity and foot/skin protection in face of ongoing neuropathy   ? Baseline needed cuing   ? Status Partially Met   ? ?  ?  ? ?  ? ? ? ? PT Long Term Goals - 07/22/21 0853   ? ?  ? PT LONG TERM GOAL #1  ? Title MMT to improve by at least one grade in all weak groups   ? Time 6   ? Period Weeks   ? Status New   ? Target Date 09/02/21   ?  ? PT LONG TERM GOAL #2  ? Title Will score 100% on Berg balance test and at least 22/24 on DGI to show improved functional balance skills   ? Time 6   ? Period Weeks   ? Status New   ?  ? PT LONG TERM GOAL #3  ? Title Will be compliant with advanced customized gym program prior to DC   ? Time 6   ? Period Weeks   ? Status New   ? ?  ?  ? ?  ? ? ? ? ? ? ? ? Plan - 08/02/21 0931   ? ? Clinical Impression Statement progressing with STGs, added active seated HS stretch as HS and LB are very tight. progressed ex and added machine interventions with speed and postural cuing needed   ? PT Treatment/Interventions ADLs/Self Care Home Management;Cryotherapy;Moist Heat;Ultrasound;Gait training;Stair training;Functional mobility training;Therapeutic activities;Therapeutic exercise;Balance training;Neuromuscular re-education;Patient/family education;Manual techniques;Passive range of motion;Dry needling;Energy conservation;Taping   ? PT  Next Visit Plan at end of session pt states he ex at complex gym but need to revisit as he needed cuing. pt is very tight and will benefit from add'l passive stretching and supine stab ex were difficult for pt   ? ?  ?  ? ?  ? ? ?Patient will benefit from skilled therapeutic intervention in order to improve the following deficits and impairments:  Decreased coordination, Increased fascial restricitons, Decreased activity tolerance, Pain, Decreased balance, Hypomobility, Impaired flexibility, Improper body mechanics, Decreased mobility, Decreased strength, Postural dysfunction ? ?Visit Diagnosis: ?Muscle weakness (generalized) ? ?Other disturbances of skin sensation ? ? ? ? ?Problem List ?There are no problems to display for this patient. ? ? ?Darneshia Demary,ANGIE, PTA ?08/02/2021, 9:38 AM ? ?[facility] ?[address] ?[address] ?[place], [place], [address] ?Phone: [phone]   Fax:  [phone] ? ?Name: Peter Greene ?MRN: [id number] ?Date of Birth: Jun 30, 1940 ? ? ? ?

## 2021-08-05 ENCOUNTER — Encounter: Payer: Self-pay | Admitting: Physical Therapy

## 2021-08-05 ENCOUNTER — Ambulatory Visit: Payer: Medicare Other | Admitting: Physical Therapy

## 2021-08-05 DIAGNOSIS — R208 Other disturbances of skin sensation: Secondary | ICD-10-CM

## 2021-08-05 DIAGNOSIS — R2681 Unsteadiness on feet: Secondary | ICD-10-CM

## 2021-08-05 DIAGNOSIS — M6281 Muscle weakness (generalized): Secondary | ICD-10-CM | POA: Diagnosis not present

## 2021-08-05 NOTE — Addendum Note (Signed)
Encounter addended by: Glendale Chard on: 08/05/2021 10:34 AM   Actions taken: Episode resolved, Problem List modified

## 2021-08-05 NOTE — Therapy (Signed)
[facility] ?[facility] ?[address] ?[place], [place], [address] ?Phone: [phone]   Fax:  [phone] ? ?Physical Therapy Treatment ? ?Patient Details  ?Name: Peter Greene ?MRN: [id number] ?Date of Birth: 1940/12/29 ?Referring Provider (PT): Fleet Contras ? ? ?Encounter Date: 08/05/2021 ? ? PT End of Session - 08/05/21 1230   ? ? Visit Number 4   ? Number of Visits 13   ? Date for PT Re-Evaluation 09/02/21   ? Authorization Type MCR and Aetna   ? Authorization Time Period 07/22/21 to 09/02/21   ? PT Start Time 1145   ? PT Stop Time 1228   ? PT Time Calculation (min) 43 min   ? Activity Tolerance Patient tolerated treatment well   ? Behavior During Therapy [facility] for tasks assessed/performed   ? ?  ?  ? ?  ? ? ?History reviewed. No pertinent past medical history. ? ?History reviewed. No pertinent surgical history. ? ?There were no vitals filed for this visit. ? ? Subjective Assessment - 08/05/21 1146   ? ? Subjective patient reports his quads feel fatigued, but no pain. 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   ? 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 Currently in Pain? No/denies   fatigue in b quads.  ? Pain Onset Yesterday   ? ?  ?  ? ?  ? ? ? ? ? ? ? ? ? ? ? ? ? ? ? ? ? ? ? ? [facility] Adult PT Treatment/Exercise - 08/05/21 0001   ? ?  ? Lumbar Exercises: Stretches  ? Passive Hamstring Stretch 30 seconds;Right;Left   ? Passive Hamstring Stretch Limitations Stretching strap, also moved into Abd/Add x 30 sec each.   ? Single Knee to Chest Stretch Right;Left;1 rep;60 seconds   ? Hip Flexor Stretch Right;Left;1 rep;60 seconds   Sidelying wiht leg behind trunk, then bend knee to also stretch quad.  ?  ? Lumbar Exercises: Aerobic  ? Nustep L5 x 6 minutes   ?  ? Lumbar Exercises: Standing  ? Other Standing Lumbar Exercises Mini squats x 15   ? Other Standing Lumbar Exercises Step ups on 6" step x 20 reps each, no UE support.   ?  ? Lumbar Exercises: Supine  ? Clam 20 reps   ? Clam  Limitations Green t band   ? Bridge Limitations Attempted brdging, but patient reported back pain.   ? ?  ?  ? ?  ? ? ? ? ? ? ? ? ? ? ? ? PT Short Term Goals - 08/05/21 1201   ? ?  ? PT SHORT TERM GOAL #1  ? Title Will be compliant with appropriate progressive HEP   ? Baseline HEP updated for stretch and strenghtening.   ? Time 1   ? Period Weeks   ? Status On-going   ? Target Date 08/12/21   ? ?  ?  ? ?  ? ? ? ? PT Long Term Goals - 07/22/21 0853   ? ?  ? PT LONG TERM GOAL #1  ? Title MMT to improve by at least one grade in all weak groups   ? Time 6   ? Period Weeks   ? Status New   ? Target Date 09/02/21   ?  ? PT LONG TERM GOAL #2  ? Title Will score 100% on Berg balance test and at least 22/24 on DGI to show improved functional balance skills   ? Time  6   ? Period Weeks   ? Status New   ?  ? PT LONG TERM GOAL #3  ? Title Will be compliant with advanced customized gym program prior to DC   ? Time 6   ? Period Weeks   ? Status New   ? ?  ?  ? ?  ? ? ? ? ? ? ? ? Plan - 08/05/21 1214   ? ? Clinical Impression Statement Patient continues to report back pain improving. He is still very tight with all LE stretching, including hip flexors and quads. Progressed HEP and progressed strengthening activities, including quads as he continues to report this is an area of frustration with burning when walking.   ? Personal Factors and Comorbidities Time since onset of injury/illness/exacerbation;Comorbidity 3+   ? 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 Examination-Activity Limitations Locomotion Level;Transfers   ? Examination-Participation Restrictions Community Activity;Valla Leaver Work   ? Stability/Clinical Decision Making Evolving/Moderate complexity   ? Clinical Decision Making Moderate   ? Rehab Potential Good   ? PT Frequency 2x / week   ? PT Duration 6 weeks   ? PT Treatment/Interventions ADLs/Self Care Home Management;Cryotherapy;Moist Heat;Ultrasound;Gait  training;Stair training;Functional mobility training;Therapeutic activities;Therapeutic exercise;Balance training;Neuromuscular re-education;Patient/family education;Manual techniques;Passive range of motion;Dry needling;Energy conservation;Taping   ? PT Next Visit Plan Continue to progress stretch and strengthening activities.   ? PT [place]   ? Consulted and Agree with Plan of Care Patient   ? ?  ?  ? ?  ? ? ?Patient will benefit from skilled therapeutic intervention in order to improve the following deficits and impairments:  Decreased coordination, Increased fascial restricitons, Decreased activity tolerance, Pain, Decreased balance, Hypomobility, Impaired flexibility, Improper body mechanics, Decreased mobility, Decreased strength, Postural dysfunction ? ?Visit Diagnosis: ?Muscle weakness (generalized) ? ?Other disturbances of skin sensation ? ?Unsteadiness on feet ? ? ? ? ?Problem List ?There are no problems to display for this patient. ? ? ?Marcelina Morel, DPT ?08/05/2021, 1:51 PM ? ?[facility] ?[address] ?[address] ?[place], [place], [address] ?Phone: [phone]   Fax:  [phone] ? ?Name: Peter Greene ?MRN: [id number] ?Date of Birth: 1940-06-25 ? ? ? ?

## 2021-08-05 NOTE — Patient Instructions (Signed)
Access Code: [email or web address] ?URL: [email or web address] ?Date: 08/05/2021 ?Prepared by: Ethel Rana ? ?Exercises ?- Supine Hamstring Stretch with Strap  - 1 x daily - 7 x weekly - 3 sets - 20 hold ?- Hip Adductors and Hamstring Stretch with Strap  - 1 x daily - 7 x weekly - 3 sets - 20 hold ?- Supine ITB Stretch with Strap  - 1 x daily - 7 x weekly - 3 sets - 20 hold ?- Supine ITB Stretch  - 1 x daily - 7 x weekly - 3 sets - 20 hold ?- Single Knee to Chest Stretch  - 1 x daily - 7 x weekly - 3 sets - 20 hold ?- Squat with Chair Touch  - 1 x daily - 7 x weekly - 2 sets - 10 reps ?- Step Up  - 1 x daily - 7 x weekly - 2 sets - 10 reps ?

## 2021-08-08 ENCOUNTER — Ambulatory Visit: Payer: Medicare Other | Admitting: Physical Therapy

## 2021-08-09 ENCOUNTER — Ambulatory Visit: Payer: Medicare Other | Admitting: Physical Therapy

## 2021-08-09 DIAGNOSIS — A4189 Other specified sepsis: Secondary | ICD-10-CM | POA: Diagnosis not present

## 2021-08-09 DIAGNOSIS — M6281 Muscle weakness (generalized): Secondary | ICD-10-CM

## 2021-08-09 DIAGNOSIS — R6510 Systemic inflammatory response syndrome (SIRS) of non-infectious origin without acute organ dysfunction: Secondary | ICD-10-CM | POA: Diagnosis not present

## 2021-08-09 NOTE — Therapy (Signed)
[facility] ?[facility] ?[address] ?[place], [place], [address] ?Phone: [phone]   Fax:  [phone] ? ?Physical Therapy Treatment ? ?Patient Details  ?Name: Peter Greene ?MRN: [id number] ?Date of Birth: 20-Jan-1941 ?Referring Provider (PT): Fleet Contras ? ? ?Encounter Date: 08/09/2021 ? ? PT End of Session - 08/09/21 0917   ? ? Visit Number 5   ? Number of Visits 13   ? Date for PT Re-Evaluation 09/02/21   ? Authorization Type MCR and Aetna   ? Authorization Time Period 07/22/21 to 09/02/21   ? PT Start Time 0845   ? PT Stop Time 0930   ? PT Time Calculation (min) 45 min   ? ?  ?  ? ?  ? ? ?No past medical history on file. ? ?No past surgical history on file. ? ?There were no vitals filed for this visit. ? ? Subjective Assessment - 08/09/21 0841   ? ? Subjective getting alittle better each day. doing ex at home and in gym   ? Currently in Pain? No/denies   ? ?  ?  ? ?  ? ? ? ? ? ? ? ? ? ? ? ? ? ? ? ? ? ? ? ? [facility] Adult PT Treatment/Exercise - 08/09/21 0001   ? ?  ? Lumbar Exercises: Aerobic  ? Nustep L5 x 6 minutes   ?  ? Lumbar Exercises: Standing  ? Row Strengthening;Power tower;Both;20 reps   10# each side  ? Shoulder Extension Strengthening;Power Tower;Both;20 reps   10# each side  ? Other Standing Lumbar Exercises wt ball OH ext and rotation 15 x each   ?  ? Lumbar Exercises: Seated  ? Sit to Stand 20 reps   wt ball  ?  ? Lumbar Exercises: Supine  ? Ab Set 15 reps;3 seconds   with ball  ? Clam 20 reps   ? Clam Limitations Green t band   ? Bent Knee Raise 20 reps   green tband  ? Bridge with [employer] 15 reps;Compliant   ? Straight Leg Raise 10 reps   with abd  ? Other Supine Lumbar Exercises feet on ball obl and KTC 15 x each   ?  ? Manual Therapy  ? Manual Therapy Passive ROM   ? Passive ROM BIL LE and trunk   ? ?  ?  ? ?  ? ? ? ? ? ? ? ? ? ? ? ? PT Short Term Goals - 08/09/21 0858   ? ?  ? PT SHORT TERM GOAL #1  ? Title Will be compliant with appropriate  progressive HEP   ? Status Achieved   ?  ? PT SHORT TERM GOAL #2  ? Title Will be able to complete 5x sit to stand with no UEs in 13 seconds or less   ? Status Achieved   ?  ? PT SHORT TERM GOAL #3  ? Title Will be able to explain at least 3 techniques to maintain safety of skin integrity and foot/skin protection in face of ongoing neuropathy   ? Status Achieved   ? ?  ?  ? ?  ? ? ? ? PT Long Term Goals - 07/22/21 0853   ? ?  ? PT LONG TERM GOAL #1  ? Title MMT to improve by at least one grade in all weak groups   ? Time 6   ? Period Weeks   ? Status New   ?  Target Date 09/02/21   ?  ? PT LONG TERM GOAL #2  ? Title Will score 100% on Berg balance test and at least 22/24 on DGI to show improved functional balance skills   ? Time 6   ? Period Weeks   ? Status New   ?  ? PT LONG TERM GOAL #3  ? Title Will be compliant with advanced customized gym program prior to DC   ? Time 6   ? Period Weeks   ? Status New   ? ?  ?  ? ?  ? ? ? ? ? ? ? ? Plan - 08/09/21 0917   ? ? Clinical Impression Statement STG met. progressed core stab ex with cuing for posture and control of mvmt, no pain with bridges today but some RT leg groin cramping today. some noted increased HS mobility , quads tight   ? PT Next Visit Plan Continue to progress stretch and strengthening activities.   ? ?  ?  ? ?  ? ? ?Patient will benefit from skilled therapeutic intervention in order to improve the following deficits and impairments:  Decreased coordination, Increased fascial restricitons, Decreased activity tolerance, Pain, Decreased balance, Hypomobility, Impaired flexibility, Improper body mechanics, Decreased mobility, Decreased strength, Postural dysfunction ? ?Visit Diagnosis: ?Muscle weakness (generalized) ? ? ? ? ?Problem List ?There are no problems to display for this patient. ? ? ?Denecia Brunette,ANGIE, PTA ?08/09/2021, 9:22 AM ? ?[facility] ?[address] ?[place], [place], [address] ?Phone: [phone]    Fax:  [phone] ? ?Name: Peter Greene ?MRN: [id number] ?Date of Birth: [birth year] ? ? ? ?

## 2021-08-11 ENCOUNTER — Inpatient Hospital Stay (HOSPITAL_COMMUNITY): Payer: Medicare Other

## 2021-08-11 ENCOUNTER — Emergency Department (HOSPITAL_COMMUNITY): Payer: Medicare Other

## 2021-08-11 ENCOUNTER — Inpatient Hospital Stay (HOSPITAL_COMMUNITY)
Admission: EM | Admit: 2021-08-11 | Discharge: 2021-08-14 | DRG: 871 | Disposition: A | Discharge status: Home / Self Care | Payer: Medicare Other | Attending: Internal Medicine | Admitting: Internal Medicine

## 2021-08-11 DIAGNOSIS — B9710 Unspecified enterovirus as the cause of diseases classified elsewhere: Secondary | ICD-10-CM | POA: Diagnosis present

## 2021-08-11 DIAGNOSIS — J189 Pneumonia, unspecified organism: Secondary | ICD-10-CM | POA: Diagnosis present

## 2021-08-11 DIAGNOSIS — D539 Nutritional anemia, unspecified: Secondary | ICD-10-CM | POA: Diagnosis present

## 2021-08-11 DIAGNOSIS — J8489 Other specified interstitial pulmonary diseases: Secondary | ICD-10-CM | POA: Diagnosis present

## 2021-08-11 DIAGNOSIS — J13 Pneumonia due to Streptococcus pneumoniae: Secondary | ICD-10-CM | POA: Diagnosis not present

## 2021-08-11 DIAGNOSIS — A4189 Other specified sepsis: Secondary | ICD-10-CM | POA: Diagnosis present

## 2021-08-11 DIAGNOSIS — J849 Interstitial pulmonary disease, unspecified: Secondary | ICD-10-CM | POA: Diagnosis not present

## 2021-08-11 DIAGNOSIS — E785 Hyperlipidemia, unspecified: Secondary | ICD-10-CM | POA: Diagnosis present

## 2021-08-11 DIAGNOSIS — R6520 Severe sepsis without septic shock: Secondary | ICD-10-CM | POA: Diagnosis present

## 2021-08-11 DIAGNOSIS — E039 Hypothyroidism, unspecified: Secondary | ICD-10-CM | POA: Diagnosis present

## 2021-08-11 DIAGNOSIS — A419 Sepsis, unspecified organism: Secondary | ICD-10-CM

## 2021-08-11 DIAGNOSIS — Z20822 Contact with and (suspected) exposure to covid-19: Secondary | ICD-10-CM | POA: Diagnosis present

## 2021-08-11 DIAGNOSIS — B348 Other viral infections of unspecified site: Secondary | ICD-10-CM | POA: Diagnosis not present

## 2021-08-11 DIAGNOSIS — Z7952 Long term (current) use of systemic steroids: Secondary | ICD-10-CM

## 2021-08-11 DIAGNOSIS — R6510 Systemic inflammatory response syndrome (SIRS) of non-infectious origin without acute organ dysfunction: Secondary | ICD-10-CM | POA: Diagnosis present

## 2021-08-11 DIAGNOSIS — B9789 Other viral agents as the cause of diseases classified elsewhere: Secondary | ICD-10-CM | POA: Diagnosis present

## 2021-08-11 DIAGNOSIS — N179 Acute kidney failure, unspecified: Secondary | ICD-10-CM | POA: Diagnosis present

## 2021-08-11 DIAGNOSIS — D469 Myelodysplastic syndrome, unspecified: Secondary | ICD-10-CM | POA: Diagnosis present

## 2021-08-11 DIAGNOSIS — I10 Essential (primary) hypertension: Secondary | ICD-10-CM

## 2021-08-11 DIAGNOSIS — Z9484 Stem cells transplant status: Secondary | ICD-10-CM

## 2021-08-11 DIAGNOSIS — J129 Viral pneumonia, unspecified: Secondary | ICD-10-CM | POA: Diagnosis present

## 2021-08-11 DIAGNOSIS — J9601 Acute respiratory failure with hypoxia: Secondary | ICD-10-CM | POA: Diagnosis present

## 2021-08-11 DIAGNOSIS — J9621 Acute and chronic respiratory failure with hypoxia: Secondary | ICD-10-CM | POA: Diagnosis not present

## 2021-08-11 DIAGNOSIS — I4891 Unspecified atrial fibrillation: Secondary | ICD-10-CM

## 2021-08-11 LAB — PROCALCITONIN: Procalcitonin: 31.48 ng/mL

## 2021-08-11 LAB — COMPREHENSIVE METABOLIC PANEL
ALT: 19 U/L (ref 0–44)
AST: 26 U/L (ref 15–41)
Albumin: 4 g/dL (ref 3.5–5.0)
Alkaline Phosphatase: 77 U/L (ref 38–126)
Anion gap: 10 (ref 5–15)
BUN: 30 mg/dL — ABNORMAL HIGH (ref 8–23)
CO2: 24 mmol/L (ref 22–32)
Calcium: 8.8 mg/dL — ABNORMAL LOW (ref 8.9–10.3)
Chloride: 100 mmol/L (ref 98–111)
Creatinine, Ser: 1.55 mg/dL — ABNORMAL HIGH (ref 0.61–1.24)
GFR, Estimated: 46 mL/min — ABNORMAL LOW (ref >60)
Glucose, Bld: 114 mg/dL — ABNORMAL HIGH (ref 70–99)
Potassium: 4 mmol/L (ref 3.5–5.1)
Sodium: 134 mmol/L — ABNORMAL LOW (ref 135–145)
Total Bilirubin: 1.2 mg/dL (ref 0.3–1.2)
Total Protein: 7.4 g/dL (ref 6.5–8.1)

## 2021-08-11 LAB — BLOOD GAS, ARTERIAL
Acid-base deficit: 1.1 mmol/L (ref 0.0–2.0)
Bicarbonate: 22.6 mmol/L (ref 20.0–28.0)
O2 Saturation: 93.8 %
Patient temperature: 37
pCO2 arterial: 34 mmHg (ref 32–48)
pH, Arterial: 7.43 (ref 7.35–7.45)
pO2, Arterial: 60 mmHg — ABNORMAL LOW (ref 83–108)

## 2021-08-11 LAB — CBC WITH DIFFERENTIAL/PLATELET
Abs Immature Granulocytes: 0.03 10*3/uL (ref 0.00–0.07)
Basophils Absolute: 0 10*3/uL (ref 0.0–0.1)
Basophils Relative: 0 %
Eosinophils Absolute: 0 10*3/uL (ref 0.0–0.5)
Eosinophils Relative: 0 %
HCT: 37 % — ABNORMAL LOW (ref 39.0–52.0)
Hemoglobin: 12.5 g/dL — ABNORMAL LOW (ref 13.0–17.0)
Immature Granulocytes: 1 %
Lymphocytes Relative: 13 %
Lymphs Abs: 0.8 10*3/uL (ref 0.7–4.0)
MCH: 34.2 pg — ABNORMAL HIGH (ref 26.0–34.0)
MCHC: 33.8 g/dL (ref 30.0–36.0)
MCV: 101.4 fL — ABNORMAL HIGH (ref 80.0–100.0)
Monocytes Absolute: 0.6 10*3/uL (ref 0.1–1.0)
Monocytes Relative: 10 %
Neutro Abs: 4.6 10*3/uL (ref 1.7–7.7)
Neutrophils Relative %: 76 %
Platelets: 146 10*3/uL — ABNORMAL LOW (ref 150–400)
RBC: 3.65 MIL/uL — ABNORMAL LOW (ref 4.22–5.81)
RDW: 16.5 % — ABNORMAL HIGH (ref 11.5–15.5)
WBC: 6.1 10*3/uL (ref 4.0–10.5)
nRBC: 0.3 % — ABNORMAL HIGH (ref 0.0–0.2)

## 2021-08-11 LAB — MRSA NEXT GEN BY PCR, NASAL: MRSA by PCR Next Gen: NOT DETECTED

## 2021-08-11 LAB — RESPIRATORY PANEL BY PCR

## 2021-08-11 LAB — LACTIC ACID, PLASMA
Lactic Acid, Venous: 2.3 mmol/L (ref 0.5–1.9)
Lactic Acid, Venous: 1.7 mmol/L (ref 0.5–1.9)
Lactic Acid, Venous: 1.5 mmol/L (ref 0.5–1.9)
Lactic Acid, Venous: 1.4 mmol/L (ref 0.5–1.9)

## 2021-08-11 LAB — URINALYSIS, ROUTINE W REFLEX MICROSCOPIC
Bacteria, UA: NONE SEEN
Bilirubin Urine: NEGATIVE
Glucose, UA: NEGATIVE mg/dL
Hgb urine dipstick: NEGATIVE
Ketones, ur: 5 mg/dL — AB
Leukocytes,Ua: NEGATIVE
Nitrite: NEGATIVE
Protein, ur: 100 mg/dL — AB
Specific Gravity, Urine: 1.02 (ref 1.005–1.030)
pH: 5 (ref 5.0–8.0)

## 2021-08-11 LAB — RESP PANEL BY RT-PCR (FLU A&B, COVID) ARPGX2
Influenza A by PCR: NEGATIVE
Influenza B by PCR: NEGATIVE
SARS Coronavirus 2 by RT PCR: NEGATIVE

## 2021-08-11 LAB — PROTIME-INR
INR: 1.9 — ABNORMAL HIGH (ref 0.8–1.2)
Prothrombin Time: 22 seconds — ABNORMAL HIGH (ref 11.4–15.2)

## 2021-08-11 LAB — BRAIN NATRIURETIC PEPTIDE: B Natriuretic Peptide: 493.4 pg/mL — ABNORMAL HIGH (ref 0.0–100.0)

## 2021-08-11 LAB — VITAMIN B12: Vitamin B-12: 379 pg/mL (ref 180–914)

## 2021-08-11 LAB — FOLATE: Folate: 37 ng/mL (ref >5.9)

## 2021-08-11 LAB — APTT: aPTT: 43 seconds — ABNORMAL HIGH (ref 24–36)

## 2021-08-11 LAB — HEPARIN LEVEL (UNFRACTIONATED): Heparin Unfractionated: >1.1 IU/mL — ABNORMAL HIGH (ref 0.30–0.70)

## 2021-08-11 LAB — STREP PNEUMONIAE URINARY ANTIGEN: Strep Pneumo Urinary Antigen: POSITIVE — AB

## 2021-08-11 MED ORDER — SODIUM CHLORIDE 0.9 % IV SOLN
500.0000 mg | INTRAVENOUS | Status: DC
  Administered 2021-08-11: 500 mg via INTRAVENOUS
  Filled 2021-08-11: qty 5

## 2021-08-11 MED ORDER — HYDROCORTISONE SOD SUC (PF) 100 MG IJ SOLR
100.0000 mg | Freq: Once | INTRAMUSCULAR | Status: AC
  Administered 2021-08-11: 100 mg via INTRAVENOUS
  Filled 2021-08-11: qty 2

## 2021-08-11 MED ORDER — HYDROCORTISONE SOD SUC (PF) 100 MG IJ SOLR: 100.0000 mg | Freq: Two times a day (BID) | INTRAMUSCULAR | Status: DC

## 2021-08-11 MED ORDER — ACETAMINOPHEN 325 MG PO TABS
650.0000 mg | ORAL_TABLET | Freq: Four times a day (QID) | ORAL | Status: DC | PRN
  Administered 2021-08-11 – 2021-08-14 (×7): 650 mg via ORAL
  Filled 2021-08-11 (×8): qty 2

## 2021-08-11 MED ORDER — LACTATED RINGERS IV BOLUS (SEPSIS)
1000.0000 mL | Freq: Once | INTRAVENOUS | Status: AC
  Administered 2021-08-11: 1000 mL via INTRAVENOUS

## 2021-08-11 MED ORDER — SODIUM CHLORIDE 0.9 % IV SOLN
2.0000 g | INTRAVENOUS | Status: DC
  Administered 2021-08-12 – 2021-08-14 (×3): 2 g via INTRAVENOUS
  Filled 2021-08-11 (×3): qty 20

## 2021-08-11 MED ORDER — SODIUM CHLORIDE 0.9 % IV BOLUS
500.0000 mL | Freq: Once | INTRAVENOUS | Status: AC
  Administered 2021-08-11: 500 mL via INTRAVENOUS

## 2021-08-11 MED ORDER — FUROSEMIDE 10 MG/ML IJ SOLN: 20.0000 mg | Freq: Once | INTRAMUSCULAR | Status: DC

## 2021-08-11 MED ORDER — LACTATED RINGERS IV BOLUS (SEPSIS): 1000.0000 mL | Freq: Once | INTRAVENOUS | Status: DC

## 2021-08-11 MED ORDER — CHLORHEXIDINE GLUCONATE CLOTH 2 % EX PADS
6.0000 | MEDICATED_PAD | Freq: Every day | CUTANEOUS | Status: DC
  Administered 2021-08-11 – 2021-08-14 (×4): 6 via TOPICAL

## 2021-08-11 MED ORDER — ACETAMINOPHEN 650 MG RE SUPP: 650.0000 mg | Freq: Four times a day (QID) | RECTAL | Status: DC | PRN

## 2021-08-11 MED ORDER — SODIUM CHLORIDE 0.9 % IV SOLN
2.0000 g | INTRAVENOUS | Status: DC
  Administered 2021-08-11: 2 g via INTRAVENOUS
  Filled 2021-08-11: qty 20

## 2021-08-11 MED ORDER — HEPARIN (PORCINE) 25000 UT/250ML-% IV SOLN
1300.0000 [IU]/h | INTRAVENOUS | Status: DC
  Administered 2021-08-11: 1300 [IU]/h via INTRAVENOUS
  Filled 2021-08-11: qty 250

## 2021-08-11 MED ORDER — ALBUTEROL SULFATE HFA 108 (90 BASE) MCG/ACT IN AERS
2.0000 | INHALATION_SPRAY | RESPIRATORY_TRACT | Status: DC | PRN
  Filled 2021-08-11: qty 6.7

## 2021-08-11 MED ORDER — PREDNISONE 20 MG PO TABS
40.0000 mg | ORAL_TABLET | Freq: Every day | ORAL | Status: DC
Start: 2021-08-12 — End: 2021-08-14
  Administered 2021-08-12 – 2021-08-14 (×3): 40 mg via ORAL
  Filled 2021-08-11 (×3): qty 2

## 2021-08-11 MED ORDER — IPRATROPIUM-ALBUTEROL 0.5-2.5 (3) MG/3ML IN SOLN
3.0000 mL | RESPIRATORY_TRACT | Status: DC
  Administered 2021-08-11 – 2021-08-12 (×3): 3 mL via RESPIRATORY_TRACT
  Filled 2021-08-11 (×3): qty 3

## 2021-08-11 MED ORDER — SODIUM CHLORIDE 0.9 % IV SOLN
500.0000 mg | INTRAVENOUS | Status: DC
  Administered 2021-08-12 – 2021-08-13 (×2): 500 mg via INTRAVENOUS
  Filled 2021-08-11 (×3): qty 5

## 2021-08-11 MED ORDER — GUAIFENESIN ER 600 MG PO TB12
600.0000 mg | ORAL_TABLET | Freq: Two times a day (BID) | ORAL | Status: DC
  Administered 2021-08-11 – 2021-08-14 (×6): 600 mg via ORAL
  Filled 2021-08-11 (×6): qty 1

## 2021-08-11 MED ORDER — LACTATED RINGERS IV SOLN: INTRAVENOUS | Status: DC

## 2021-08-11 MED ORDER — IPRATROPIUM-ALBUTEROL 0.5-2.5 (3) MG/3ML IN SOLN: 3.0000 mL | Freq: Four times a day (QID) | RESPIRATORY_TRACT | Status: DC | PRN

## 2021-08-11 NOTE — Consult Note (Addendum)
? ?NAME:  Peter Greene, MRN:  [id number], DOB:  01/15/1941, LOS: 0 ?ADMISSION DATE:  08/11/2021, CONSULTATION DATE:  08/11/21 ?REFERRING MD:  Cherylann Ratel, DO CHIEF COMPLAINT:  Sepsis/septic shock/ILD  ? ?History of Present Illness:  ?Mr. Peter Greene is a 81 year old male who presents with shortness of breath and confusion that yesterday. ? ?He was diagnosed with NSIP x 10 years ago and followed by [name] (sp?), his pulmonologist from [place]. He began a steroid taper over the last several months and he is currently on prednisone 1 mg daily. He also was recently advised by his PCP to wean his lasix and has been off of this medication for two weeks. Yesterday he noticed he began having shortness of breath with saturations in the mid 70s and 80s. Associated with cough now. He was scheduled to see [name] at [facility] at the end of this month but decided to present to the ED for evaluation when his wife noted he had worsening confusion over the last few days. ? ?Of note, his last hospitalization was 1.5 years ago and required two month stay for pneumonia with prolonged chronic hypoxemia that recovered. Has been on room air since then saturations usually ~93% on RA. ? ?In the ED, he is sitting up and talking in full sentences but needing to stop and catch his breath frequently. He has mild tachycardia and mild tachypnea on NRB with SpO2 95%. SBP in 90s. Afebrile. ? ?Pertinent  Medical History  ?NSIP, chronic prednisone therapy, MDS s/p BMT, atrial fibrillation, COPD, HLD ? ?Significant Hospital Events: ?Including procedures, antibiotic start and stop dates in addition to other pertinent events   ?PCCM consulted for ILD, sepsis/septic shock ? ?Interim History / Subjective:  ?As above ? ?Objective   ?Blood pressure (!) 96/50, pulse 89, temperature 100.3 ?F (37.9 ?C), temperature source Oral, resp. rate 20, height '5\' 8"'$  (1.727 m), weight 90.7 kg, SpO2 90 %. ?   ?   ? ?Intake/Output Summary (Last 24 hours)  at 08/11/2021 1616 ?Last data filed at 08/11/2021 1432 ?Gross per 24 hour  ?Intake 1200 ml  ?Output --  ?Net 1200 ml  ? ?Filed Weights  ? 08/11/21 1104  ?Weight: 90.7 kg  ? ?Physical Exam: ?General: Elderly, acute on chronically ill-appearing, appears uncomfortable ?HENT: [place], AT, OP clear, MMM, NRB in place ?Eyes: EOMI, no scleral icterus ?Respiratory: Central rhonchi, bilateral crackles with diminished mid-right air entry ?Cardiovascular: RRR, -M/R/G, no JVD ?GI: BS+, soft, nontender ?Extremities:-Edema,-tenderness ?Neuro: AAO x4, CNII-XII grossly intact ?Psych: Normal mood, normal affect ? ?CT Chest 08/11/21 Multifocal pneumonia including dense RML consolidation and RLL. Subpleural reticulation scattered present ? ?LA resolved ? ?Resolved Hospital Problem list   ?N/A ? 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 If clinical status worsens then can increase to stress dosing ?If shock worsens, recommend repeat IVF fluid resuscitation first before initiating low dose vasopressor support (prefer Levophed) ?Antibiotics for CAP coverage ?Echo ? ?Acute hypoxemic respiratory failure secondary to pneumonia, likely bacterial ?ILD exacerbation ?Wean from NRB to HFNC for goal SpO2 88-92% ?Order ceftriaxone and azithromycin ?Start prednisone 40 mg daily  (~ 0.5 mg/kg) ?Duonebs ?Order urine strep, urine legionella ?Order RVP ?F/u cultures ?Pulmonary hygiene with flutter valve, mucinex ? ?Atrial fibrillation ?EKG atrial tachy ?Telemetry ?Hold home Eliquis. Start systemic heparin ? ?  AKI ?Monitor UOP/Cr ?Avoid nephrotoxic agents ? ?Acute metabolic encephalopathy secondary to hypoxemia ?Supportive care ? ?Mildly elevated INR/PT/PTT ?Trend ? ?Hx MDS s/p BMT ?Dohle bodies on CBC diff ?Trend CBC diff ? ?Pulmonary will continue to follow ? ?Best  Practice (right click and "Reselect all SmartList Selections" daily)  ? ?Diet/type: NPO ?DVT prophylaxis: systemic heparin ?GI prophylaxis: N/A ?Lines: N/A ?Foley:  N/A ?Code Status:  full code ?Last date of multidisciplinary goals of care discussion [N/A] ? ?Labs   ?CBC: ?Recent Labs  ?Lab 08/11/21 ?1130  ?WBC 6.1  ?NEUTROABS 4.6  ?HGB 12.5*  ?HCT 37.0*  ?MCV 101.4*  ?PLT 146*  ? ? ?Basic Metabolic Panel: ?Recent Labs  ?Lab 08/11/21 ?1130  ?NA 134*  ?K 4.0  ?CL 100  ?CO2 24  ?GLUCOSE 114*  ?BUN 30*  ?CREATININE 1.55*  ?CALCIUM 8.8*  ? ?GFR: ?Estimated Creatinine Clearance: 42.9 mL/min (A) (by C-G formula based on SCr of 1.55 mg/dL (H)). ?Recent Labs  ?Lab 08/11/21 ?1130 08/11/21 ?1350  ?PROCALCITON  --  31.48  ?WBC 6.1  --   ?LATICACIDVEN 2.3* 1.7  ? ? ?Liver Function Tests: ?Recent Labs  ?Lab 08/11/21 ?1130  ?AST 26  ?ALT 19  ?ALKPHOS 77  ?BILITOT 1.2  ?PROT 7.4  ?ALBUMIN 4.0  ? ?No results for input(s): LIPASE, AMYLASE in the last 168 hours. ?No results for input(s): AMMONIA in the last 168 hours. ? ?ABG ?   ?Component Value Date/Time  ? PHART 7.43 08/11/2021 1415  ? PCO2ART 34 08/11/2021 1415  ? PO2ART 60 (L) 08/11/2021 1415  ? HCO3 22.6 08/11/2021 1415  ? ACIDBASEDEF 1.1 08/11/2021 1415  ? O2SAT 93.8 08/11/2021 1415  ?  ? ?Coagulation Profile: ?Recent Labs  ?Lab 08/11/21 ?1130  ?INR 1.9*  ? ? ?Cardiac Enzymes: ?No results for input(s): CKTOTAL, CKMB, CKMBINDEX, TROPONINI in the last 168 hours. ? ?HbA1C: ?No results found for: HGBA1C ? ?CBG: ?No results for input(s): GLUCAP in the last 168 hours. ? ?Review of Systems:   ?Review of Systems  ?Constitutional:  Negative for chills, diaphoresis, fever, malaise/fatigue and weight loss.  ?HENT:  Negative for congestion, ear pain and sore throat.   ?Respiratory:  Positive for cough and shortness of breath. Negative for hemoptysis, sputum production and wheezing.   ?Cardiovascular:  Negative for chest pain, palpitations and leg swelling.  ?Gastrointestinal:  Negative for  abdominal pain, heartburn and nausea.  ?Genitourinary:  Negative for frequency.  ?Musculoskeletal:  Positive for back pain. Negative for joint pain and myalgias.  ?Skin:  Negative for itching and rash.  ?Neurological:  Negative for dizziness, speech change, focal weakness, loss of consciousness, weakness and headaches.  ?Endo/Heme/Allergies:  Does not bruise/bleed easily.  ?Psychiatric/Behavioral:  Negative for depression. The patient is not nervous/anxious.   ? ? ?Past Medical History:  ?He,  has no past medical history on file.  ? ?Surgical History:  ?No past surgical history on file.  ? ?Social History:  ?   ? ?Family History:  ?His family history is not on file.  ? ?Allergies ?Not on File  ? ?Home Medications  ?Prior to Admission medications   ?Medication Sig Start Date End Date Taking? Authorizing Provider  ?atorvastatin (LIPITOR) 40 MG tablet Take 40 mg by mouth daily. 06/28/21  Yes [provider]  ?ELIQUIS 5 MG TABS tablet Take 5 mg by mouth 2 (two) times daily. 05/28/21   [provider]  ?  ? ?Critical care time: 35 min ?  ? ?The patient is  critically ill with multiple organ systems failure and requires high complexity decision making for assessment and support, frequent evaluation and titration of therapies, application of advanced monitoring technologies and extensive interpretation of multiple databases.  Independent Critical Care Time: 35 Minutes.  ? ?Rodman Pickle, M.D. ?[facility] ?08/11/2021 4:17 PM  ? ?Please see Amion for pager number to reach on-call Pulmonary and Critical Care Team. ? ? ? ? ?

## 2021-08-11 NOTE — Progress Notes (Signed)
Notified Lab that ABG being sent for analysis. 

## 2021-08-11 NOTE — Progress Notes (Signed)
ANTICOAGULATION CONSULT NOTE - Initial Consult ? ?Pharmacy Consult for heparin ?Indication: atrial fibrillation (Eliquis on hold) ? ?Not on File ? ?Patient Measurements: ?Height: '5\' 8"'$  (172.7 cm) ?Weight: 90.7 kg (200 lb) ?IBW/kg (Calculated) : 68.4 ?Heparin Dosing Weight: 87 kg ? ?Vital Signs: ?Temp: 100.3 ?F (37.9 ?C) (05/14 1557) ?Temp Source: Oral (05/14 1557) ?BP: 96/50 (05/14 1557) ?Pulse Rate: 89 (05/14 1557) ? ?Labs: ?Recent Labs  ?  08/11/21 ?1130  ?HGB 12.5*  ?HCT 37.0*  ?PLT 146*  ?APTT 43*  ?LABPROT 22.0*  ?INR 1.9*  ?CREATININE 1.55*  ? ? ?Estimated Creatinine Clearance: 42.9 mL/min (A) (by C-G formula based on SCr of 1.55 mg/dL (H)). ? ? ?Medical History: ?No past medical history on file. ? ?Medications: Apixaban 5 mg PO BID PTA ? ?Assessment: ?Pt is a 64 yoM presenting with SOB and confusion. PMH significant for NSIP, atrial fibrillation (prescribed apixaban PTA). Pharmacy consulted to transition anticoagulation to IV heparin on admission.  ? ?Last dose of apixaban reported on 5/14 @ 0930 ? ?Today, 08/11/21 ?CBC: Hgb/Plt both slightly low ?SCr 1.55, CrCl ~40 mL/min ?Baseline HL is falsely elevated due to recent DOAC ? ?Goal of Therapy:  ?aPTT 66 - 102 seconds ?Heparin level 0.3-0.7 units/ml ?Monitor platelets by anticoagulation protocol: Yes ?  ?Plan:  ?No bolus since patient currently anticoagulated with apixaban. Will start heparin drip 12 hours after last apixaban dose ?Heparin infusion at 1300 units/hr ?Check 8 hour HL/aPTT. WIll monitor using aPTT since HL falsely high at baseline. Once they correlate, can monitor using HL only.  ?CBC, HL/aPTT daily while on heparin infusion ?Monitor for signs of bleeding  ?Follow for ability to transition back to apixaban ? ?Lenis Noon, PharmD ?08/11/2021,4:52 PM ?

## 2021-08-11 NOTE — Progress Notes (Signed)
Spoke with Dr. Marylyn Ishihara in reference to pt diet status, pt is to be NPO at this time. This nurse informed the provider that the patient did eat within the last hour (dinner, amount unknown). This nurse informed the provider that the pt's blood pressure is 80/44 at this time. Verbal order to start NS 500cc bolus at this time.  ?

## 2021-08-11 NOTE — ED Provider Notes (Signed)
?[facility] DEPT ?Provider Note ? ? ?CSN: [id number] ?Arrival date & time: 08/11/21  1050 ? ?  ? ?History ? ?Chief Complaint  ?Patient presents with  ? Shortness of Breath  ? ? ?Peter Greene is a 81 y.o. male. ? ? ?Shortness of Breath ?Associated symptoms: no fever   ? ?Patient presented to the ED for evaluation of cough, shortness of breath, weakness.  Patient has history of hypertension, remote history of laryngeal cancer, interstitial lung disease, status post bone marrow transplant.  Patient started having trouble with cough and congestion the last couple days.  He has been intermittently confused.  This morning the wife noted his oxygen level was low and brought him to the emergency department.  Patient has been coughing.  He has not had any definite fevers.  No chest pain.  No abdominal pain.  No swelling ? ?Home Medications ?Prior to Admission medications   ?Medication Sig Start Date End Date Taking? Authorizing Provider  ?atorvastatin (LIPITOR) 40 MG tablet Take 40 mg by mouth daily. 06/28/21  Yes [provider]  ?ELIQUIS 5 MG TABS tablet Take 5 mg by mouth 2 (two) times daily. 05/28/21   [provider]  ?   ? ?Allergies    ?Patient has no allergy information on record.   ? ?Review of Systems   ?Review of Systems  ?Constitutional:  Negative for fever.  ?Respiratory:  Positive for shortness of breath.   ? ?Physical Exam ?Updated Vital Signs ?BP 114/63   Pulse (!) 104   Temp 98.2 ?F (36.8 ?C) (Oral)   Resp (!) 23   Ht 1.727 m ('5\' 8"'$ )   Wt 90.7 kg   SpO2 90%   BMI 30.41 kg/m?  ?Physical Exam ?Vitals and nursing note reviewed.  ?Constitutional:   ?   Appearance: He is ill-appearing.  ?   Comments: Blood pressure noted to be in the high 90s at the bedside  ?HENT:  ?   Head: Normocephalic and atraumatic.  ?   Right Ear: External ear normal.  ?   Left Ear: External ear normal.  ?Eyes:  ?   General: No scleral icterus.    ?   Right eye: No discharge.     ?    Left eye: No discharge.  ?   Conjunctiva/sclera: Conjunctivae normal.  ?Neck:  ?   Trachea: No tracheal deviation.  ?Cardiovascular:  ?   Rate and Rhythm: Normal rate and regular rhythm.  ?Pulmonary:  ?   Effort: Pulmonary effort is normal. No respiratory distress.  ?   Breath sounds: No stridor. Examination of the right-lower field reveals rales. Examination of the left-lower field reveals rales. Rales present. No wheezing.  ?Abdominal:  ?   General: Bowel sounds are normal. There is no distension.  ?   Palpations: Abdomen is soft.  ?   Tenderness: There is no abdominal tenderness. There is no guarding or rebound.  ?Musculoskeletal:     ?   General: No tenderness or deformity.  ?   Cervical back: Neck supple.  ?Skin: ?   General: Skin is warm and dry.  ?   Findings: No rash.  ?Neurological:  ?   General: No focal deficit present.  ?   Mental Status: He is alert.  ?   Cranial Nerves: No cranial nerve deficit (no facial droop, extraocular movements intact, no slurred speech).  ?   Sensory: No sensory deficit.  ?   Motor: No abnormal muscle tone or  seizure activity.  ?   Coordination: Coordination normal.  ?   Comments: Alert, oriented, answering questions appropriately  ?Psychiatric:     ?   Mood and Affect: Mood normal.  ? ? ?ED Results / Procedures / Treatments   ?Labs ?(all labs ordered are listed, but only abnormal results are displayed) ?Labs Reviewed  ?LACTIC ACID, PLASMA - Abnormal; Notable for the following components:  ?    Result Value  ? Lactic Acid, Venous 2.3 (*)   ? All other components within normal limits  ?COMPREHENSIVE METABOLIC PANEL - Abnormal; Notable for the following components:  ? Sodium 134 (*)   ? Glucose, Bld 114 (*)   ? BUN 30 (*)   ? Creatinine, Ser 1.55 (*)   ? Calcium 8.8 (*)   ? GFR, Estimated 46 (*)   ? All other components within normal limits  ?CBC WITH DIFFERENTIAL/PLATELET - Abnormal; Notable for the following components:  ? RBC 3.65 (*)   ? Hemoglobin 12.5 (*)   ? HCT 37.0 (*)    ? MCV 101.4 (*)   ? MCH 34.2 (*)   ? RDW 16.5 (*)   ? Platelets 146 (*)   ? nRBC 0.3 (*)   ? All other components within normal limits  ?PROTIME-INR - Abnormal; Notable for the following components:  ? Prothrombin Time 22.0 (*)   ? INR 1.9 (*)   ? All other components within normal limits  ?APTT - Abnormal; Notable for the following components:  ? aPTT 43 (*)   ? All other components within normal limits  ?RESP PANEL BY RT-PCR (FLU A&B, COVID) [id number]  ?CULTURE, BLOOD (ROUTINE X 2)  ?CULTURE, BLOOD (ROUTINE X 2)  ?LACTIC ACID, PLASMA  ?URINALYSIS, ROUTINE W REFLEX MICROSCOPIC  ?BRAIN NATRIURETIC PEPTIDE  ?PROCALCITONIN  ? ? ?EKG ?EKG Interpretation ? ?Date/Time:  Sunday Aug 11 2021 11:05:25 EDT ?Ventricular Rate:  103 ?PR Interval:  160 ?QRS Duration: 99 ?QT Interval:  384 ?QTC Calculation: 493 ?R Axis:   24 ?Text Interpretation: Ectopic atrial tachycardia, unifocal Atrial premature complexes Borderline prolonged QT interval No old tracing to compare Confirmed by Dorie Rank [phone]) on 08/11/2021 11:12:55 AM ? ?Radiology ?DG Chest Port 1 View ? ?Result Date: 08/11/2021 ?CLINICAL DATA:  Concern for sepsis. EXAM: PORTABLE CHEST 1 VIEW COMPARISON:  None Available. FINDINGS: The lungs are suboptimally inflated. Unremarkable cardiomediastinal contours. Dense airspace consolidation is identified within the right lung base. Left lung appears clear. No signs of interstitial edema. Visualized osseous structures appear intact. IMPRESSION: Right lung base airspace consolidation compatible with pneumonia. Electronically Signed   By: Kerby Moors M.D.   On: 08/11/2021 12:29   ? ?Procedures ?Procedures  ? ? ?Medications Ordered in ED ?Medications  ?albuterol (VENTOLIN HFA) 108 (90 Base) MCG/ACT inhaler 2 puff (has no administration in time range)  ?lactated ringers infusion (has no administration in time range)  ?lactated ringers bolus 1,000 mL (1,000 mLs Intravenous New Bag/Given 08/11/21 1211)  ?  And  ?lactated ringers bolus  1,000 mL (has no administration in time range)  ?  And  ?lactated ringers bolus 1,000 mL (has no administration in time range)  ?cefTRIAXone (ROCEPHIN) 2 g in sodium chloride 0.9 % 100 mL IVPB (0 g Intravenous Stopped 08/11/21 1210)  ?azithromycin (ZITHROMAX) 500 mg in sodium chloride 0.9 % 250 mL IVPB (500 mg Intravenous New Bag/Given 08/11/21 1210)  ? ? ?ED Course/ Medical Decision Making/ A&P ?Clinical Course as of 08/11/21 1340  ?Sun Aug 11, 2021  ?  1245 Comprehensive metabolic panel(!) [JK]  ?1245 Resp Panel by RT-PCR (Flu A&B, Covid) Nasopharyngeal Swab ?Negative [JK]  ?1246 CBC with Differential(!) ?Mild decrease in hemoglobin, platelets slightly decreased [JK]  ?1246 Comprehensive metabolic panel(!) ?Creatinine increased [JK]  ?[place] 1 View ?Chest x-ray images and radiology report reviewed.  Right lower lobe pneumonia noted [JK]  ?1339 Case discussed with Dr Marylyn Ishihara [JK]  ?  ?Clinical Course User Index ?[JK] Dorie Rank, MD  ? ?                        ?Medical Decision Making ?Patient presents with cough, congestion and shortness of breath.  He has a new oxygen requirement.  Patient's blood pressure was also low at bedside.  Differential diagnosis includes but not limited to possibility of pneumonia, sepsis, CHF, covid.  Patient has history of severe pneumonia.  Also has history of bone marrow transplant.  At risk for severe pneumonia complications ? ?Problems Addressed: ?AKI (acute kidney injury) [facility]): complicated acute illness or injury ?Pneumonia of right lower lobe due to infectious organism: acute illness or injury that poses a threat to life or bodily functions ?SIRS (systemic inflammatory response syndrome) ([place]): acute illness or injury that poses a threat to life or bodily functions ? ?Amount and/or Complexity of Data Reviewed ?External Data Reviewed: labs and notes. ?   Details: No medical records noted in our system.  Outpatient records reviewed via care everywhere ?Labs: ordered.  Decision-making details documented in ED Course. ?Radiology: ordered and independent interpretation performed. Decision-making details documented in ED Course. ? ?Risk ?Prescription drug management. ?Decision regarding ho

## 2021-08-11 NOTE — Plan of Care (Signed)
?  Problem: Nutrition: ?Goal: Adequate nutrition will be maintained ?Outcome: Progressing ?  ?Problem: Health Behavior/Discharge Planning: ?Goal: Ability to manage health-related needs will improve ?Outcome: Not Progressing ?  ?Problem: Clinical Measurements: ?Goal: Respiratory complications will improve ?Outcome: Not Progressing ?  ?Problem: Pain Managment: ?Goal: General experience of comfort will improve ?Outcome: Not Progressing ?  ?

## 2021-08-11 NOTE — ED Triage Notes (Signed)
Pt arrived via POV, with wife. Sob since last night, not taking POs as normal. Spo2 85% on RA. Placed on 2L [place] with good effect. Pt wife states he has had intermittent confusion the last couple days.  ?

## 2021-08-11 NOTE — Progress Notes (Signed)
Elink following code sepsis °

## 2021-08-11 NOTE — H&P (Signed)
?History and Physical  ? ? ?Patient: Peter Greene WER:[id number] DOB: 1940-06-24 ?DOA: 08/11/2021 ?DOS: the patient was seen and examined on 08/11/2021 ?PCP: Burnard Bunting, MD  ?Patient coming from: Home ? ?Chief Complaint:  ?Chief Complaint  ?Patient presents with  ? Shortness of Breath  ? ?HPI: Peter Greene is a 81 y.o. male with medical history significant of ILD, a fib, MDS, HLD. Presenting with dyspnea and confusion. Yesterday he began to feel short of breath. He noticed that his SpO2 was in the high-70s and low-80s. His symptoms seemed to worsen this morning. He was becoming confused. There was no fevers or chills. No N/V/D. No sick contacts. His wife became concerned and brought him to the hospital for evaluation.   ? ?Review of Systems: As mentioned in the history of present illness. All other systems reviewed and are negative. ? ?PMHx ?A fib ?ILD ?COPD ?HLD ?Myelodysplastic syndrome ? ?PSHx ?Bone marrow transplant ?Knee replacement x 2 ?Hernia repair ? ?Social History:  No tobacco; 2 shots liquor weekly; No illicit Rx.  ? ?NKDA ? ?FamHx ?Reviewed. Non-contributory. ? ?Prior to Admission medications   ?Medication Sig Start Date End Date Taking? Authorizing Provider  ?atorvastatin (LIPITOR) 40 MG tablet Take 40 mg by mouth daily. 06/28/21  Yes [provider]  ?ELIQUIS 5 MG TABS tablet Take 5 mg by mouth 2 (two) times daily. 05/28/21   [provider]  ? ? ?Physical Exam: ?Vitals:  ? 08/11/21 1130 08/11/21 1200 08/11/21 1230 08/11/21 1315  ?BP: (!) 97/54 (!) 87/54 101/61 114/63  ?Pulse: 96 95 98 (!) 104  ?Resp: (!) 29 (!) 26 (!) 22 (!) 23  ?Temp:      ?TempSrc:      ?SpO2: 91% 94% 92% 90%  ?Weight:      ?Height:      ? ?General: 81 y.o. male resting in bed in NAD ?Eyes: PERRL, normal sclera ?ENMT: Nares patent w/o discharge, orophaynx clear, dentition normal, ears w/o discharge/lesions/ulcers ?Neck: Supple, trachea midline ?Cardiovascular: tachy, +S1, S2, no m/g/r, equal pulses  throughout ?Respiratory: course, decreased at bases, increased WOB on 5L [place] ?GI: BS+, NDNT, no masses noted, no organomegaly noted ?MSK: No e/c/c ?Neuro: A&O x 3, no focal deficits ?Psyc: Appropriate interaction and affect, calm/cooperative ? ?Data Reviewed: ? ?Na+  134 ?Glucose  114 ?BUN  30 ?Scr  1.55 ?WBC  6.1 ?Hgb  12.5 ?MCV  101.4 ? ?CXR: Right lung base airspace consolidation compatible with pneumonia. ? ?EKG: looks sinus tach, no st elevations ? ?Assessment and Plan: ?Multifocal PNA ?Sepsis secondary to above ?    - admit to inpt, progressive ?    - continue abx for now, check procal ?    - also check BNP ?    - trend lactic acid ?    - he sounds more wet than infected; hold fluids for right now and give solulcortef, if pressures don't respond, we'll need to start pressures ? ?AKI ?    - getting fluids ?    - check renal US ? ?Macrocytic anemia ?    - no evidence of bleed ?    - check B12, THF ? ?Hypothyroidism ?    - resume home regimen when confirmed ? ?HTN ?    - hypotensive, hold his home regimen for now ?    - solucortef ? ?ILD ?    - he is on chronic steroids that have been tapered; right now, getting solucortef, will continue ? ?A fib ?    -  on eliquis, continue; hold his metoprolol ? ?Advance Care Planning:   Code Status: FULL ? ?Consults: None ? ?Family Communication: w/ wife at bedside ? ?Severity of Illness: ?The appropriate patient status for this patient is INPATIENT. Inpatient status is judged to be reasonable and necessary in order to provide the required intensity of service to ensure the patient's safety. The patient's presenting symptoms, physical exam findings, and initial radiographic and laboratory data in the context of their chronic comorbidities is felt to place them at high risk for further clinical deterioration. Furthermore, it is not anticipated that the patient will be medically stable for discharge from the hospital within 2 midnights of admission.  ? ?* I certify that at the  point of admission it is my clinical judgment that the patient will require inpatient hospital care spanning beyond 2 midnights from the point of admission due to high intensity of service, high risk for further deterioration and high frequency of surveillance required.* ? ?Author: ?Jonnie Finner, DO ?08/11/2021 1:27 PM ? ?For on call review [email or web address].  ?

## 2021-08-11 NOTE — Progress Notes (Signed)
PT demonstrates hands on understanding of Flutter device. 

## 2021-08-12 ENCOUNTER — Ambulatory Visit: Payer: Medicare Other | Admitting: Physical Therapy

## 2021-08-12 ENCOUNTER — Inpatient Hospital Stay (HOSPITAL_COMMUNITY): Payer: Medicare Other

## 2021-08-12 ENCOUNTER — Other Ambulatory Visit: Payer: Self-pay

## 2021-08-12 DIAGNOSIS — I4891 Unspecified atrial fibrillation: Secondary | ICD-10-CM

## 2021-08-12 DIAGNOSIS — J189 Pneumonia, unspecified organism: Secondary | ICD-10-CM | POA: Diagnosis not present

## 2021-08-12 DIAGNOSIS — J13 Pneumonia due to Streptococcus pneumoniae: Secondary | ICD-10-CM | POA: Diagnosis not present

## 2021-08-12 DIAGNOSIS — J9601 Acute respiratory failure with hypoxia: Secondary | ICD-10-CM | POA: Diagnosis not present

## 2021-08-12 DIAGNOSIS — J9621 Acute and chronic respiratory failure with hypoxia: Secondary | ICD-10-CM | POA: Diagnosis not present

## 2021-08-12 DIAGNOSIS — J849 Interstitial pulmonary disease, unspecified: Secondary | ICD-10-CM | POA: Diagnosis not present

## 2021-08-12 DIAGNOSIS — D469 Myelodysplastic syndrome, unspecified: Secondary | ICD-10-CM

## 2021-08-12 DIAGNOSIS — B348 Other viral infections of unspecified site: Secondary | ICD-10-CM | POA: Diagnosis not present

## 2021-08-12 LAB — CBC WITH DIFFERENTIAL/PLATELET
Abs Immature Granulocytes: 0 10*3/uL (ref 0.00–0.07)
Basophils Absolute: 0 10*3/uL (ref 0.0–0.1)
Basophils Relative: 0 %
Eosinophils Absolute: 0 10*3/uL (ref 0.0–0.5)
Eosinophils Relative: 0 %
HCT: 35.6 % — ABNORMAL LOW (ref 39.0–52.0)
Hemoglobin: 11.7 g/dL — ABNORMAL LOW (ref 13.0–17.0)
Lymphocytes Relative: 11 %
Lymphs Abs: 0.9 10*3/uL (ref 0.7–4.0)
MCH: 33.8 pg (ref 26.0–34.0)
MCHC: 32.9 g/dL (ref 30.0–36.0)
MCV: 102.9 fL — ABNORMAL HIGH (ref 80.0–100.0)
Monocytes Absolute: 0.6 10*3/uL (ref 0.1–1.0)
Monocytes Relative: 7 %
Neutro Abs: 6.8 10*3/uL (ref 1.7–7.7)
Neutrophils Relative %: 82 %
Platelets: 130 10*3/uL — ABNORMAL LOW (ref 150–400)
RBC: 3.46 MIL/uL — ABNORMAL LOW (ref 4.22–5.81)
RDW: 16.8 % — ABNORMAL HIGH (ref 11.5–15.5)
WBC: 8.3 10*3/uL (ref 4.0–10.5)
nRBC: 0 % (ref 0.0–0.2)

## 2021-08-12 LAB — HEPARIN LEVEL (UNFRACTIONATED): Heparin Unfractionated: >1.1 IU/mL — ABNORMAL HIGH (ref 0.30–0.70)

## 2021-08-12 LAB — COMPREHENSIVE METABOLIC PANEL
ALT: 18 U/L (ref 0–44)
AST: 27 U/L (ref 15–41)
Albumin: 3.7 g/dL (ref 3.5–5.0)
Alkaline Phosphatase: 58 U/L (ref 38–126)
Anion gap: 10 (ref 5–15)
BUN: 28 mg/dL — ABNORMAL HIGH (ref 8–23)
CO2: 21 mmol/L — ABNORMAL LOW (ref 22–32)
Calcium: 8.2 mg/dL — ABNORMAL LOW (ref 8.9–10.3)
Chloride: 104 mmol/L (ref 98–111)
Creatinine, Ser: 1.08 mg/dL (ref 0.61–1.24)
GFR, Estimated: >60 mL/min (ref >60)
Glucose, Bld: 103 mg/dL — ABNORMAL HIGH (ref 70–99)
Potassium: 3.7 mmol/L (ref 3.5–5.1)
Sodium: 135 mmol/L (ref 135–145)
Total Bilirubin: 0.7 mg/dL (ref 0.3–1.2)
Total Protein: 6.8 g/dL (ref 6.5–8.1)

## 2021-08-12 LAB — PROTIME-INR
INR: 2.2 — ABNORMAL HIGH (ref 0.8–1.2)
Prothrombin Time: 24.2 seconds — ABNORMAL HIGH (ref 11.4–15.2)

## 2021-08-12 LAB — ECHOCARDIOGRAM COMPLETE
Height: 68 in
S' Lateral: 3.2 cm
Weight: 3333.36 oz

## 2021-08-12 LAB — APTT: aPTT: 109 seconds — ABNORMAL HIGH (ref 24–36)

## 2021-08-12 LAB — PROCALCITONIN: Procalcitonin: 39.23 ng/mL

## 2021-08-12 LAB — CORTISOL-AM, BLOOD: Cortisol - AM: 14.8 ug/dL (ref 6.7–22.6)

## 2021-08-12 MED ORDER — PANTOPRAZOLE SODIUM 20 MG PO TBEC
20.0000 mg | DELAYED_RELEASE_TABLET | Freq: Every day | ORAL | Status: DC
  Administered 2021-08-12 – 2021-08-14 (×3): 20 mg via ORAL
  Filled 2021-08-12 (×3): qty 1

## 2021-08-12 MED ORDER — PERFLUTREN LIPID MICROSPHERE
1.0000 mL | INTRAVENOUS | Status: AC | PRN
  Administered 2021-08-12: 3 mL via INTRAVENOUS

## 2021-08-12 MED ORDER — VALACYCLOVIR HCL 500 MG PO TABS
500.0000 mg | ORAL_TABLET | Freq: Every day | ORAL | Status: DC
  Administered 2021-08-12 – 2021-08-14 (×3): 500 mg via ORAL
  Filled 2021-08-12 (×3): qty 1

## 2021-08-12 MED ORDER — LEVOTHYROXINE SODIUM 25 MCG PO TABS
25.0000 ug | ORAL_TABLET | Freq: Every day | ORAL | Status: DC
  Administered 2021-08-13 – 2021-08-14 (×2): 25 ug via ORAL
  Filled 2021-08-12 (×2): qty 1

## 2021-08-12 MED ORDER — ALBUTEROL SULFATE (2.5 MG/3ML) 0.083% IN NEBU: 2.5000 mg | INHALATION_SOLUTION | RESPIRATORY_TRACT | Status: DC | PRN

## 2021-08-12 MED ORDER — APIXABAN 5 MG PO TABS
5.0000 mg | ORAL_TABLET | Freq: Two times a day (BID) | ORAL | Status: DC
  Administered 2021-08-12 – 2021-08-14 (×5): 5 mg via ORAL
  Filled 2021-08-12 (×5): qty 1

## 2021-08-12 MED ORDER — GABAPENTIN 100 MG PO CAPS
100.0000 mg | ORAL_CAPSULE | Freq: Three times a day (TID) | ORAL | Status: DC
  Administered 2021-08-12 – 2021-08-14 (×7): 100 mg via ORAL
  Filled 2021-08-12 (×7): qty 1

## 2021-08-12 MED ORDER — TAMSULOSIN HCL 0.4 MG PO CAPS
0.4000 mg | ORAL_CAPSULE | Freq: Every day | ORAL | Status: DC
  Administered 2021-08-12 – 2021-08-14 (×3): 0.4 mg via ORAL
  Filled 2021-08-12 (×3): qty 1

## 2021-08-12 MED ORDER — ATORVASTATIN CALCIUM 40 MG PO TABS
40.0000 mg | ORAL_TABLET | Freq: Every day | ORAL | Status: DC
Start: 2021-08-12 — End: 2021-08-14
  Administered 2021-08-12 – 2021-08-14 (×3): 40 mg via ORAL
  Filled 2021-08-12 (×3): qty 1

## 2021-08-12 MED ORDER — KETOTIFEN FUMARATE 0.025 % OP SOLN
1.0000 [drp] | Freq: Two times a day (BID) | OPHTHALMIC | Status: DC
  Administered 2021-08-12 – 2021-08-14 (×4): 1 [drp] via OPHTHALMIC
  Filled 2021-08-12: qty 5

## 2021-08-12 NOTE — Assessment & Plan Note (Signed)
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

## 2021-08-12 NOTE — Progress Notes (Signed)
Echocardiogram ?2D Echocardiogram has been performed. ? ?Oneal Deputy Carah Barrientes RDCS ?08/12/2021, 10:10 AM ?

## 2021-08-12 NOTE — Progress Notes (Signed)
[facility] ? ? ?Patient name: Peter Greene Admit date: 08/11/2021  ?DOB: [birth year] LOS: 1  ?MRN: [id number] Consult date: 08/11/2021  ?Referring provider: Cherylann Ratel, [facility] CC: Dyspnea  ? ? ?History:  ?81 yo male former smoker brought to [facility] with acute onset of dyspnea and confusion.  SpO2 in ER was 85% on room air.  Found to have pneumonia with elevated lactic acid level.  He recently moved to [place] area.  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  He was followed by Oncology at [facility] for MDS s/p stem cell transplant. ? ?Past medical history:  ?MDS, GVHD, HTN, A fib, ILD, Chronic prednisone therapy with adrenal insufficiency, Laryngeal cancer, COVID 19 pneumonia May and August 2021, HLD, Hypothyroidism, GERD, BPH, ascending thoracic aortic dilation ? ?Pulmonary tests:  ?PFT 10/06/18 >> FEV1 2.89 (105%), FVC 3.70 (102%), FEV1% 78, TLC 5.69 (93%), DLCO 86% ? ?Cardiac tests:  ?Echo 10/06/18 >> EF 60 to 65%, mild RV dilation ? 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 ?Micro:  ?COVID/Flu 5/14 >> negative ?RVP 5/14 >> Rhinovirus ?Pneumococcal Ag 5/14 >> positive ?Legionella Ag 5/14 >>  ? Lines:  ? ?  ?Antibiotics:  ?Rocephin 5/14 >> ?Zithromax 5/14 >>  ? Consults:  ? ?  ? ?Interim history:  ?Breathing better.  Still has cough.  Denies chest pain, nausea, headache. ? ?Vital signs:  ?BP (!) 89/58   Pulse 80   Temp 97.7 ?F (36.5 ?C) (Oral)   Resp (!) 25   Ht '5\' 8"'$  (1.727 m)   Wt 94.5 kg   SpO2 95%   BMI 31.68 kg/m?  ? Intake/output:  ?I/O last 3  completed shifts: ?In: 1305.3 [I.V.:105.3; IV Piggyback:1200] ?Out: 2119 [Urine:1650] ?  ?Physical exam:  ? ?General - alert ?Eyes - pupils reactive ?ENT - no sinus tenderness, no stridor ?Cardiac - irregular ?Chest - scattered rhonchi ?Abdomen - soft, non tender, + bowel sounds ?Extremities - no cyanosis, clubbing, or edema ?Skin - no rashes ?Neuro - normal strength, moves extremities, follows commands ?Psych - normal mood and behavior ? Best practice:  ? ?DVT - heparin gtt ?SUP - protonix ?Nutrition - heart healthy ?  ? ?Assessment/plan:  ? ?Acute hypoxic respiratory failure 2nd to Rhinovirus viral pneumonia complicated by RML and RLL lobar pneumococcal community acquired pneumonia with severe sepsis. ?- day 2 of ABx ?- f/u CXR 5/16 ?- goal SpO2 90 to 95% ?- bronchial hygiene ?- mobilize as tolerated ? 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 ?- previously followed by Dr. Josem Kaufmann with pulmonary in [place], [place] ?- he has outpt pulmonary visit schedule with Dr. Marshell Garfinkel on 08/20/21 ?- previous PFT does not support diagnosis of COPD >> change BDs to prn ? 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 ?- previously followed by Dr. Berneta Sages with oncology at [facility] ?- valacyclovir prophylaxis ? ?History of atrial fibrillation, HLD, HTN, Ascending thoracic aortic dilation. ?- f/u Echo ?- should be okay to resume eliquis >> defer to primary team ?-  hold outpt lopressor for now ? ?Chronic prednisone therapy with adrenal insufficiency. ?- on prednisone 40 mg daily >> on 5/16 can start process to wean down to baseline dose 1 mg prednisone daily ? ?Hx of hypothyroidism. ?- continue synthroid ? ?Resolved hospital problems:  ?Lactic acidosis, AKI from hypovolemia ? ?Goals of care/Family discussions:  ?Code status: full code ? ?Labs:  ? ? ?  Latest Ref Rng & Units 08/12/2021  ?  6:33 AM 08/11/2021  ? 11:30  AM  ?CMP  ?Glucose 70 - 99 mg/dL 103   114    ?BUN 8 - 23 mg/dL 28   30    ?Creatinine 0.61 - 1.24 mg/dL 1.08   1.55    ?Sodium 135 - 145 mmol/L 135   134    ?Potassium 3.5 - 5.1 mmol/L 3.7   4.0    ?Chloride 98 - 111 mmol/L 104   100    ?CO2 22 - 32 mmol/L 21   24    ?Calcium 8.9 - 10.3 mg/dL 8.2   8.8    ?Total Protein 6.5 - 8.1 g/dL 6.8   7.4    ?Total Bilirubin 0.3 - 1.2 mg/dL 0.7   1.2    ?Alkaline Phos 38 - 126 U/L 58   77    ?AST 15 - 41 U/L 27   26    ?ALT 0 - 44 U/L 18   19    ? ? ? ?  Latest Ref Rng & Units 08/11/2021  ? 11:30 AM  ?CBC  ?WBC 4.0 - 10.5 K/uL 6.1    ?Hemoglobin 13.0 - 17.0 g/dL 12.5    ?Hematocrit 39.0 - 52.0 % 37.0    ?Platelets 150 - 400 K/uL 146    ? ? ?ABG ?   ?Component Value Date/Time  ? PHART 7.43 08/11/2021 1415  ? PCO2ART 34 08/11/2021 1415  ? PO2ART 60 (L) 08/11/2021 1415  ? HCO3 22.6 08/11/2021 1415  ? ACIDBASEDEF 1.1 08/11/2021 1415  ? O2SAT 93.8 08/11/2021 1415  ? ? ?CBG (last 3)  ?No results for input(s): GLUCAP in the last 72 hours. ? ? ?Signature:  ?Chesley Mires, MD ?[place] ?Pager - [phone] ?08/12/2021, 9:13 AM ? ? ? ? ? ? ? ?

## 2021-08-12 NOTE — Hospital Course (Addendum)
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  He was found to be hypoxic in the 70s to 80s and was placed on oxygen on admission.  He is typically not on oxygen at baseline. ?He has been on a prolonged steroid taper over the past several months from his prior pulmonologist.  He has recently established care with [facility] in [place] but has not yet been seen. ?On admission he was found to have severe right-sided pneumonia on imaging.  Strep pneumo urinary antigen was also positive along with respiratory swab positive for enterovirus/rhinovirus.  He was started on antibiotics, steroids, breathing treatments, and oxygen.  Pulmonology was also consulted on admission. ?

## 2021-08-12 NOTE — Assessment & Plan Note (Addendum)
-   continue steroids with gradual taper down to baseline '1mg'$  prednisone ? ?

## 2021-08-12 NOTE — Assessment & Plan Note (Addendum)
-   multifocal consolidation noted on CT chest notably in RML/RLL ?-Appreciate pulmonology assistance ?- Continue Rocephin and azithromycin (can transition to oral azithromycin and Augmentin to complete courses at discharge. See pulm note) ?- Continue steroids (needs taper at discharge) ?- Continue oxygen, see respiratory failure ?

## 2021-08-12 NOTE — Assessment & Plan Note (Addendum)
-   Not on oxygen at baseline.  Etiology presumed to large right-sided pneumonia ?- See treatment for pneumonia and sepsis ?- Weaned o2 to room air at time of d/c ?- Underwent walk test with nursing on 5/17, able to ambulate on room air ?-Pulm recs to complete course of abx per below ?

## 2021-08-12 NOTE — Assessment & Plan Note (Addendum)
-   cont course of abx per PCCM recs ?

## 2021-08-12 NOTE — Assessment & Plan Note (Addendum)
-   Baseline hemoglobin 11 to 12 g/dL ?- Currently at baseline ?-Folate 37, B12 379 ?

## 2021-08-12 NOTE — Progress Notes (Signed)
?  Transition of Care (TOC) Screening Note ? ? ?Patient Details  ?Name: Peter Greene ?Date of Birth: 1940/07/09 ? ? ?Transition of Care (TOC) CM/SW Contact:    ?Deandrea Rion, LCSW ?Phone Number: ?08/12/2021, 9:20 AM ? ? ? ?Transition of Care Department [facility]) has reviewed patient and no TOC needs have been identified at this time. We will continue to monitor patient advancement through interdisciplinary progression rounds. If new patient transition needs arise, please place a TOC consult. ? ? ?

## 2021-08-12 NOTE — Progress Notes (Signed)
?Progress Note ? ? ? ?Peter Greene   ?DGU:[id number]  ?DOB: 1940-09-23  ?DOA: 08/11/2021     1 ?PCP: Burnard Bunting, MD ? ?Initial CC: SOB, cough ? ?Hospital Course: ?Mr. 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  He was found to be hypoxic in the 70s to 80s and was placed on oxygen on admission.  He is typically not on oxygen at baseline. ?He has been on a prolonged steroid taper over the past several months from his prior pulmonologist.  He has recently established care with [facility] in [place] but has not yet been seen. ?On admission he was found to have severe right-sided pneumonia on imaging.  Strep pneumo urinary antigen was also positive along with respiratory swab positive for enterovirus/rhinovirus.  He was started on antibiotics, steroids, breathing treatments, and oxygen.  Pulmonology was also consulted on admission. ? ?Interval History:  ?Seen this morning in his room resting comfortably in bed.  Breathing was improved and he was in no distress. ? ?Assessment and Plan: ?* PNA (pneumonia) ?- see sepsis ?- continue treatment  ? ?Sepsis due to pneumonia [facility]) ?- multifocal consolidation noted on CT chest notably in RML/RLL ?-Appreciate pulmonology assistance ?- Continue Rocephin and azithromycin ?- Continue steroids ?- Continue oxygen, see respiratory failure ? ?Acute respiratory failure with hypoxia ([place]) ?- Not on oxygen at baseline.  Etiology presumed to large right-sided pneumonia ?- See treatment for pneumonia and sepsis ?- Wean as able ? ?Rhinovirus infection ?- continue steroids and supportive care ?- droplet precautions  ? ?ILD (interstitial lung disease) ([place]) ?- Appreciate pulmonology assistance ?- Planning to establish with [name] on 08/20/2021 ?-Previously treated in [place] by [name] ? ?MDS (myelodysplastic syndrome) (HCC) ?- s/p donor CMV + stem cell  transplant; developed graft vs host disease ?- continue valacyclovir ppx ? ?A-fib ([place]) ?- d/c heparin ?- resume eliquis ?- lopressor on hold for now  ? ?HTN (hypertension) ?- lopressor on hold  ? ?Hypothyroidism ?- continue synthroid  ? ?Macrocytic anemia ?- Baseline hemoglobin 11 to 12 g/dL ?- Currently at baseline ?-Folate 37, B12 379 ? ? ?Old records reviewed in assessment of this patient ? ?Antimicrobials: ?Azithromycin 08/11/2021 >> current ?Rocephin 08/11/2021 >> current ? ?DVT prophylaxis:  ? ?apixaban (ELIQUIS) tablet 5 mg  ? ?Code Status:   Code Status: Full Code ? ?Disposition Plan: Home in 2 to 3 days ?Status is: Inpatient ? ?Objective: ?Blood pressure (!) 105/56, pulse (!) 106, temperature 99.4 ?F (37.4 ?C), temperature source Oral, resp. rate (!) 21, height '5\' 8"'$  (1.727 m), weight 94.5 kg, SpO2 92 %.  ?Examination:  ?Physical Exam ?Constitutional:   ?   General: He is not in acute distress. ?   Appearance: Normal appearance.  ?HENT:  ?   Head: Normocephalic and atraumatic.  ?   Mouth/Throat:  ?   Mouth: Mucous membranes are moist.  ?Eyes:  ?   Extraocular Movements: Extraocular movements intact.  ?Cardiovascular:  ?   Rate and Rhythm: Normal rate and regular rhythm.  ?   Heart sounds: Normal heart sounds.  ?Pulmonary:  ?   Comments: Coarse right-sided breath sounds, no wheezing appreciated. ?Abdominal:  ?   General: Bowel sounds are normal. There is no distension.  ?   Palpations: Abdomen is soft.  ?   Tenderness: There is no abdominal tenderness.  ?Musculoskeletal:     ?  General: Normal range of motion.  ?   Cervical back: Normal range of motion and neck supple.  ?Skin: ?   General: Skin is warm and dry.  ?Neurological:  ?   General: No focal deficit present.  ?   Mental Status: He is alert.  ?Psychiatric:     ?   Mood and Affect: Mood normal.     ?   Behavior: Behavior normal.  ?  ? ?Consultants:  ?Pulmonology ? ?Procedures:  ? ? ?Data Reviewed: ?Results for orders placed or performed during the  hospital encounter of 08/11/21 (from the past 24 hour(s))  ?Lactic acid, plasma     Status: None  ? Collection Time: 08/11/21  1:50 PM  ?Result Value Ref Range  ? Lactic Acid, Venous 1.7 0.5 - 1.9 mmol/L  ?Procalcitonin - Baseline     Status: None  ? Collection Time: 08/11/21  1:50 PM  ?Result Value Ref Range  ? Procalcitonin 31.48 ng/mL  ?Vitamin B12     Status: None  ? Collection Time: 08/11/21  1:50 PM  ?Result Value Ref Range  ? Vitamin B-12 379 180 - 914 pg/mL  ?Folate     Status: None  ? Collection Time: 08/11/21  1:50 PM  ?Result Value Ref Range  ? Folate 37.0 >5.9 ng/mL  ?Blood gas, arterial     Status: Abnormal  ? Collection Time: 08/11/21  2:15 PM  ?Result Value Ref Range  ? Delivery systems NASAL CANNULA   ? pH, Arterial 7.43 7.35 - 7.45  ? pCO2 arterial 34 32 - 48 mmHg  ? pO2, Arterial 60 (L) 83 - 108 mmHg  ? Bicarbonate 22.6 20.0 - 28.0 mmol/L  ? Acid-base deficit 1.1 0.0 - 2.0 mmol/L  ? O2 Saturation 93.8 %  ? Patient temperature 37.0   ? Collection site RIGHT BRACHIAL   ?Blood Culture (routine x 2)     Status: None (Preliminary result)  ? Collection Time: 08/11/21  4:14 PM  ? Specimen: BLOOD  ?Result Value Ref Range  ? Specimen Description    ?  BLOOD BLOOD LEFT HAND ?Performed at [facility], [place] [address], [place], [place] [address] ?  ? Special Requests    ?  BOTTLES DRAWN AEROBIC ONLY Blood Culture adequate volume ?Performed at [facility], [place] [address], [place], [place] [address] ?  ? Culture    ?  NO GROWTH < 24 HOURS ?Performed at [facility], [place] [address] [place],  [address] ?  ? Report Status PENDING   ?Brain natriuretic peptide     Status: Abnormal  ? Collection Time: 08/11/21  4:14 PM  ?Result Value Ref Range  ? B Natriuretic Peptide 493.4 (H) 0.0 - 100.0 pg/mL  ?Lactic acid, plasma     Status: None  ? Collection Time: 08/11/21  4:14 PM  ?Result Value Ref Range  ? Lactic Acid, Venous 1.5 0.5 - 1.9 mmol/L  ?Heparin level  (unfractionated)     Status: Abnormal  ? Collection Time: 08/11/21  5:04 PM  ?Result Value Ref Range  ? Heparin Unfractionated >1.10 (H) 0.30 - 0.70 IU/mL  ?Urinalysis, Routine w reflex microscopic Urine, Clean Catch     Status: Abnormal  ? Collection Time: 08/11/21  6:00 PM  ?Result Value Ref Range  ? Color, Urine YELLOW YELLOW  ? APPearance CLEAR CLEAR  ? Specific Gravity, Urine 1.020 1.005 - 1.030  ? pH 5.0 5.0 - 8.0  ? Glucose, UA NEGATIVE  NEGATIVE mg/dL  ? Hgb urine dipstick NEGATIVE NEGATIVE  ? Bilirubin Urine NEGATIVE NEGATIVE  ? Ketones, ur 5 (A) NEGATIVE mg/dL  ? Protein, ur 100 (A) NEGATIVE mg/dL  ? Nitrite NEGATIVE NEGATIVE  ? Leukocytes,Ua NEGATIVE NEGATIVE  ? RBC / HPF 0-5 0 - 5 RBC/hpf  ? WBC, UA 0-5 0 - 5 WBC/hpf  ? Bacteria, UA NONE SEEN NONE SEEN  ? Mucus PRESENT   ?Strep pneumoniae urinary antigen     Status: Abnormal  ? Collection Time: 08/11/21  6:00 PM  ?Result Value Ref Range  ? Strep Pneumo Urinary Antigen POSITIVE (A) NEGATIVE  ?Lactic acid, plasma     Status: None  ? Collection Time: 08/11/21  6:53 PM  ?Result Value Ref Range  ? Lactic Acid, Venous 1.4 0.5 - 1.9 mmol/L  ?MRSA Next Gen by PCR, Nasal     Status: None  ? Collection Time: 08/11/21  6:53 PM  ? Specimen: Nasal Mucosa; Nasal Swab  ?Result Value Ref Range  ? MRSA by PCR Next Gen NOT DETECTED NOT DETECTED  ?Protime-INR     Status: Abnormal  ? Collection Time: 08/12/21  6:33 AM  ?Result Value Ref Range  ? Prothrombin Time 24.2 (H) 11.4 - 15.2 seconds  ? INR 2.2 (H) 0.8 - 1.2  ?Cortisol-am, blood     Status: None  ? Collection Time: 08/12/21  6:33 AM  ?Result Value Ref Range  ? Cortisol - AM 14.8 6.7 - 22.6 ug/dL  ?Procalcitonin     Status: None  ? Collection Time: 08/12/21  6:33 AM  ?Result Value Ref Range  ? Procalcitonin 39.23 ng/mL  ?Comprehensive metabolic panel     Status: Abnormal  ? Collection Time: 08/12/21  6:33 AM  ?Result Value Ref Range  ? Sodium 135 135 - 145 mmol/L  ? Potassium 3.7 3.5 - 5.1 mmol/L  ? Chloride 104 98 -  111 mmol/L  ? CO2 21 (L) 22 - 32 mmol/L  ? Glucose, Bld 103 (H) 70 - 99 mg/dL  ? BUN 28 (H) 8 - 23 mg/dL  ? Creatinine, Ser 1.08 0.61 - 1.24 mg/dL  ? Calcium 8.2 (L) 8.9 - 10.3 mg/dL  ? Total Protein 6.8 6.5 - 8.1 g/dL  ? [place]

## 2021-08-12 NOTE — Assessment & Plan Note (Addendum)
-   Appreciate pulmonology assistance ?- Planning to establish with [name] on 08/20/2021 ?-Previously treated in [place] by [name] ?-Per pulmonology, will need 5-day taper of prednisone back down to baseline of 1 mg daily ?

## 2021-08-12 NOTE — Assessment & Plan Note (Addendum)
-   lopressor on hold initially, resume on d/c ?

## 2021-08-12 NOTE — Assessment & Plan Note (Signed)
-  continue synthroid 

## 2021-08-12 NOTE — Progress Notes (Signed)
ANTICOAGULATION CONSULT NOTE - Follow Up Consult ? ?Pharmacy Consult for Heparin > Apixaban ?Indication: atrial fibrillation  ? ?No Known Allergies ? ?Patient Measurements: ?Height: '5\' 8"'$  (172.7 cm) ?Weight: 94.5 kg (208 lb 5.4 oz) ?IBW/kg (Calculated) : 68.4 ?Heparin Dosing Weight: 87 kg ? ?Vital Signs: ?Temp: 97.7 ?F (36.5 ?C) (05/15 0304) ?Temp Source: Oral (05/15 0304) ?BP: 89/58 (05/15 0700) ?Pulse Rate: 80 (05/15 0700) ? ?Labs: ?Recent Labs  ?  08/11/21 ?1130 08/11/21 ?1704 08/12/21 ?0633  ?HGB 12.5*  --   --   ?HCT 37.0*  --   --   ?PLT 146*  --   --   ?APTT 43*  --   --   ?LABPROT 22.0*  --  24.2*  ?INR 1.9*  --  2.2*  ?HEPARINUNFRC  --  >1.10* >1.10*  ?CREATININE 1.55*  --  1.08  ? ? ?Estimated Creatinine Clearance: 62.8 mL/min (by C-G formula based on SCr of 1.08 mg/dL). ? ? ?Medications:  ?Infusions:  ? azithromycin    ? cefTRIAXone (ROCEPHIN)  IV    ? heparin 1,300 Units/hr (08/11/21 2153)  ? ? ?Assessment: ?24 yoM admitted on 5/14 with SOB/confusion at home.  Found to have pneumonia, hypotension in ED.  PMH significant for NSIP, atrial fibrillation (prescribed apixaban PTA, last dose on 5/14 at 0930). Pharmacy consulted to transition anticoagulation to IV heparin on admission due to NPO status, now transitioning back to PO apixaban.   ?  ?Today, 08/12/2021: ?Heparin level > 1.1, remains falsely elevated due to recent DOAC ?APTT 109, slightly above therapeutic range ?CBC: Hgb/Plt low yesterday, none new today.   ?No bleeding or complications reported.  ?SCr decreased to 1.08 ? ?Goal of Therapy:  ?Heparin level 0.3-0.7 units/ml ?aPTT 66-102 seconds ?Monitor platelets by anticoagulation protocol: Yes ?  ?Plan:  ?D/C heparin ?Resume apixaban 5 mg PO BID ? ?Gretta Arab PharmD, BCPS ?Clinical Pharmacist ?[facility] main pharmacy [phone] ?08/12/2021 7:11 AM ? ?

## 2021-08-12 NOTE — Assessment & Plan Note (Addendum)
-   continue eliquis  ?- lopressor initially on hold, resume on d/c as bp improved ?

## 2021-08-13 ENCOUNTER — Inpatient Hospital Stay (HOSPITAL_COMMUNITY): Payer: Medicare Other

## 2021-08-13 DIAGNOSIS — J13 Pneumonia due to Streptococcus pneumoniae: Secondary | ICD-10-CM | POA: Diagnosis not present

## 2021-08-13 DIAGNOSIS — J9601 Acute respiratory failure with hypoxia: Secondary | ICD-10-CM | POA: Diagnosis not present

## 2021-08-13 LAB — BASIC METABOLIC PANEL
Anion gap: 7 (ref 5–15)
BUN: 27 mg/dL — ABNORMAL HIGH (ref 8–23)
CO2: 25 mmol/L (ref 22–32)
Calcium: 8.5 mg/dL — ABNORMAL LOW (ref 8.9–10.3)
Chloride: 108 mmol/L (ref 98–111)
Creatinine, Ser: 1.05 mg/dL (ref 0.61–1.24)
GFR, Estimated: >60 mL/min (ref >60)
Glucose, Bld: 109 mg/dL — ABNORMAL HIGH (ref 70–99)
Potassium: 4.3 mmol/L (ref 3.5–5.1)
Sodium: 140 mmol/L (ref 135–145)

## 2021-08-13 LAB — CBC WITH DIFFERENTIAL/PLATELET
Abs Immature Granulocytes: 0.13 10*3/uL — ABNORMAL HIGH (ref 0.00–0.07)
Basophils Absolute: 0 10*3/uL (ref 0.0–0.1)
Basophils Relative: 0 %
Eosinophils Absolute: 0 10*3/uL (ref 0.0–0.5)
Eosinophils Relative: 0 %
HCT: 32.6 % — ABNORMAL LOW (ref 39.0–52.0)
Hemoglobin: 10.7 g/dL — ABNORMAL LOW (ref 13.0–17.0)
Immature Granulocytes: 2 %
Lymphocytes Relative: 10 %
Lymphs Abs: 0.8 10*3/uL (ref 0.7–4.0)
MCH: 33.9 pg (ref 26.0–34.0)
MCHC: 32.8 g/dL (ref 30.0–36.0)
MCV: 103.2 fL — ABNORMAL HIGH (ref 80.0–100.0)
Monocytes Absolute: 0.7 10*3/uL (ref 0.1–1.0)
Monocytes Relative: 8 %
Neutro Abs: 6.5 10*3/uL (ref 1.7–7.7)
Neutrophils Relative %: 80 %
Platelets: 135 10*3/uL — ABNORMAL LOW (ref 150–400)
RBC: 3.16 MIL/uL — ABNORMAL LOW (ref 4.22–5.81)
RDW: 16.5 % — ABNORMAL HIGH (ref 11.5–15.5)
WBC: 8.1 10*3/uL (ref 4.0–10.5)
nRBC: 0 % (ref 0.0–0.2)

## 2021-08-13 LAB — LEGIONELLA PNEUMOPHILA SEROGP 1 UR AG: L. pneumophila Serogp 1 Ur Ag: NEGATIVE

## 2021-08-13 LAB — MAGNESIUM: Magnesium: 2.4 mg/dL (ref 1.7–2.4)

## 2021-08-13 MED ORDER — SODIUM CHLORIDE 0.9 % IV SOLN: INTRAVENOUS | Status: DC | PRN

## 2021-08-13 NOTE — Progress Notes (Signed)
?Progress Note ? ? ? ?Peter Greene   ?HAL:[id number]  ?DOB: [birth year]  ?DOA: 08/11/2021     2 ?PCP: Burnard Bunting, MD ? ?Initial CC: SOB, cough ? 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  He was found to be hypoxic in the 70s to 80s and was placed on oxygen on admission.  He is typically not on oxygen at baseline. ?He has been on a prolonged steroid taper over the past several months from his prior pulmonologist.  He has recently established care with [facility] in [place] but has not yet been seen. ?On admission he was found to have severe right-sided pneumonia on imaging.  Strep pneumo urinary antigen was also positive along with respiratory swab positive for enterovirus/rhinovirus.  He was started on antibiotics, steroids, breathing treatments, and oxygen.  Pulmonology was also consulted on admission. ? ?Interval History:  ?No events overnight.  Still feels very weak and uncomfortable this morning with ongoing coughing.  He did ambulate in the hall with nursing requiring 2 L oxygen still and was also noted to be a little unsteady with his gait. ? ?Assessment and Plan: ?* PNA (pneumonia) ?- see sepsis ?- continue treatment  ? ?Sepsis due to pneumonia [facility]) ?- multifocal consolidation noted on CT chest notably in RML/RLL ?-Appreciate pulmonology assistance ?- Continue Rocephin and azithromycin (can transition to oral azithromycin and Augmentin to complete courses at discharge. See pulm note) ?- Continue steroids (needs taper at discharge) ?- Continue oxygen, see respiratory failure ? ?Acute respiratory failure with hypoxia ([place]) ?- Not on oxygen at baseline.  Etiology presumed to large right-sided pneumonia ?- See treatment for pneumonia and sepsis ?- Wean as able ?-Underwent walk test with nursing on 08/13/2021 and still requires 2 L with ambulation and likely will  need oxygen at discharge given size of pneumonia and expected slow recovery ? ?Rhinovirus infection ?- continue steroids and supportive care ?- droplet precautions  ? ?ILD (interstitial lung disease) ([place]) ?- Appreciate pulmonology assistance ?- Planning to establish with [name] on 08/20/2021 ?-Previously treated in [place] by [name] ?-Per pulmonology, will need 5-day taper of prednisone back down to baseline of 1 mg daily ? 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 ?A-fib ([place]) ?- continue eliquis  ?- lopressor on hold for now; as blood pressure further improves, can resume ? ?HTN (hypertension) ?- lopressor on hold  ? ?Hypothyroidism ?- continue synthroid  ? ?Macrocytic anemia ?- Baseline hemoglobin 11 to 12 g/dL ?- Currently at baseline ?-Folate 37, B12 379 ? ?AKI (acute kidney injury) (HCC)-resolved as of 08/13/2021 ?- baseline creatinine ~ 1 ?- patient presents with increase in creat >0.3 mg/dL above baseline, creat increase >1.5x baseline presumed to have occurred within past 7 days PTA ?- back to baseline  ? ? ? ?Old records reviewed in assessment of this patient ? ?Antimicrobials: ?Azithromycin 08/11/2021 >> current ?Rocephin 08/11/2021 >> current ? ?DVT prophylaxis:  ? ?apixaban (ELIQUIS) tablet 5 mg  ? ?Code Status:   Code Status: Full Code ? ?Disposition Plan: Home 1 to 2 days ?Status is: Inpatient ? ?Objective: ?Blood pressure (!) 125/97, pulse 82, temperature 97.9 ?F (36.6 ?C), temperature source Oral, resp. rate 19, height '5\' 8"'$  (1.727 m), weight 94.5 kg, SpO2 96 %.  ?Examination:  ?Physical Exam ?  Constitutional:   ?   General: He is not in acute distress. ?   Appearance: Normal appearance.  ?HENT:  ?   Head: Normocephalic and atraumatic.  ?   Mouth/Throat:  ?   Mouth: Mucous membranes are moist.  ?Eyes:  ?   Extraocular Movements: Extraocular movements intact.  ?Cardiovascular:  ?   Rate and Rhythm:  Normal rate and regular rhythm.  ?   Heart sounds: Normal heart sounds.  ?Pulmonary:  ?   Comments: Coarse right-sided breath sounds, no wheezing appreciated. ?Abdominal:  ?   General: Bowel sounds are normal. There is no distension.  ?   Palpations: Abdomen is soft.  ?   Tenderness: There is no abdominal tenderness.  ?Musculoskeletal:     ?   General: Normal range of motion.  ?   Cervical back: Normal range of motion and neck supple.  ?Skin: ?   General: Skin is warm and dry.  ?Neurological:  ?   General: No focal deficit present.  ?   Mental Status: He is alert.  ?Psychiatric:     ?   Mood and Affect: Mood normal.     ?   Behavior: Behavior normal.  ?  ? ?Consultants:  ?Pulmonology ? ?Procedures:  ? ? ?Data Reviewed: ?Results for orders placed or performed during the hospital encounter of 08/11/21 (from the past 24 hour(s))  ?CBC with Differential/Platelet     Status: Abnormal  ? Collection Time: 08/12/21  3:44 PM  ?Result Value Ref Range  ? WBC 8.3 4.0 - 10.5 K/uL  ? RBC 3.46 (L) 4.22 - 5.81 MIL/uL  ? Hemoglobin 11.7 (L) 13.0 - 17.0 g/dL  ? HCT 35.6 (L) 39.0 - 52.0 %  ? MCV 102.9 (H) 80.0 - 100.0 fL  ? MCH 33.8 26.0 - 34.0 pg  ? MCHC 32.9 30.0 - 36.0 g/dL  ? RDW 16.8 (H) 11.5 - 15.5 %  ? Platelets 130 (L) 150 - 400 K/uL  ? nRBC 0.0 0.0 - 0.2 %  ? Neutrophils Relative % 82 %  ? Neutro Abs 6.8 1.7 - 7.7 K/uL  ? Lymphocytes Relative 11 %  ? Lymphs Abs 0.9 0.7 - 4.0 K/uL  ? Monocytes Relative 7 %  ? Monocytes Absolute 0.6 0.1 - 1.0 K/uL  ? Eosinophils Relative 0 %  ? Eosinophils Absolute 0.0 0.0 - 0.5 K/uL  ? Basophils Relative 0 %  ? Basophils Absolute 0.0 0.0 - 0.1 K/uL  ? Abs Immature Granulocytes 0.00 0.00 - 0.07 K/uL  ? Reactive, Benign Lymphocytes PRESENT   ? Polychromasia PRESENT   ?Basic metabolic panel     Status: Abnormal  ? Collection Time: 08/13/21  2:59 AM  ?Result Value Ref Range  ? Sodium 140 135 - 145 mmol/L  ? Potassium 4.3 3.5 - 5.1 mmol/L  ? Chloride 108 98 - 111 mmol/L  ? CO2 25 22 - 32 mmol/L  ?  Glucose, Bld 109 (H) 70 - 99 mg/dL  ? BUN 27 (H) 8 - 23 mg/dL  ? Creatinine, Ser 1.05 0.61 - 1.24 mg/dL  ? Calcium 8.5 (L) 8.9 - 10.3 mg/dL  ? GFR, Estimated >60 >60 mL/min  ? Anion gap 7 5 - 15  ?CBC with Differential/Platelet     Status: Abnormal  ? Collection Time: 08/13/21  2:59 AM  ?Result Value Ref Range  ? WBC 8.1 4.0 - 10.5 K/uL  ? RBC 3.16 (L) 4.22 - 5.81 MIL/uL  ? Hemoglobin 10.7 (L) 13.0 - 17.0  g/dL  ? HCT 32.6 (L) 39.0 - 52.0 %  ? MCV 103.2 (H) 80.0 - 100.0 fL  ? MCH 33.9 26.0 - 34.0 pg  ? MCHC 32.8 30.0 - 36.0 g/dL  ? RDW 16.5 (H) 11.5 - 15.5 %  ? Platelets 135 (L) 150 - 400 K/uL  ? nRBC 0.0 0.0 - 0.2 %  ? Neutrophils Relative % 80 %  ? Neutro Abs 6.5 1.7 - 7.7 K/uL  ? Lymphocytes Relative 10 %  ? Lymphs Abs 0.8 0.7 - 4.0 K/uL  ? Monocytes Relative 8 %  ? Monocytes Absolute 0.7 0.1 - 1.0 K/uL  ? Eosinophils Relative 0 %  ? Eosinophils Absolute 0.0 0.0 - 0.5 K/uL  ? Basophils Relative 0 %  ? Basophils Absolute 0.0 0.0 - 0.1 K/uL  ? WBC Morphology DOHLE BODIES   ? Immature Granulocytes 2 %  ? Abs Immature Granulocytes 0.13 (H) 0.00 - 0.07 K/uL  ? Acanthocytes PRESENT   ? Burr Cells PRESENT   ? Polychromasia PRESENT   ? Ovalocytes PRESENT   ?Magnesium     Status: None  ? Collection Time: 08/13/21  2:59 AM  ?Result Value Ref Range  ? Magnesium 2.4 1.7 - 2.4 mg/dL  ?  ?I have Reviewed nursing notes, Vitals, and Lab results since pt's last encounter. Pertinent lab results : see above ?I have ordered test including BMP, CBC, Mg ?I have reviewed the last note from staff over past 24 hours ?I have discussed pt's care plan and test results with nursing staff, case manager ? ? LOS: 2 days  ? ?Dwyane Dee, MD ?[facility] ?08/13/2021, 2:25 PM ?

## 2021-08-13 NOTE — Progress Notes (Signed)
Feeling better.  Still has cough and chest congestion.  Sat in chair and walked in his room yesterday.  Chest xray shows stable changes of RML and to less degree RLL infiltrates.   ? ?I think he is ready to transition to oral antibiotics.  Can transition to oral zithromax to 500 mg daily an complete 5 days of this.  Can transition from rocephin to augmentin 875 bid, and complete 7 days total of this combination. ? ?Over the next 5 days can taper his prednisone to baseline dose of 1 mg day. ? ?He will need to be assessed for home oxygen therapy prior to discharge. ? ?He has appointment with Dr. Marshell Garfinkel already scheduled for 08/20/21 at our [address] office in [place] a 9:30 AM.  He will need a chest xray at his follow up appointment. ? ?Please call if additional assistance needed while he is in hospital. ? ?Chesley Mires, MD ?[facility] ?Pager - [phone] ?08/13/2021, 8:35 AM ? ?

## 2021-08-13 NOTE — Assessment & Plan Note (Signed)
-   baseline creatinine ~ 1 ?- patient presents with increase in creat >0.3 mg/dL above baseline, creat increase >1.5x baseline presumed to have occurred within past 7 days PTA ?- back to baseline  ? ?

## 2021-08-14 ENCOUNTER — Encounter (HOSPITAL_COMMUNITY): Payer: Self-pay | Admitting: Internal Medicine

## 2021-08-14 DIAGNOSIS — J849 Interstitial pulmonary disease, unspecified: Secondary | ICD-10-CM | POA: Diagnosis not present

## 2021-08-14 DIAGNOSIS — N179 Acute kidney failure, unspecified: Secondary | ICD-10-CM | POA: Diagnosis not present

## 2021-08-14 LAB — CBC WITH DIFFERENTIAL/PLATELET
Abs Immature Granulocytes: 0.05 10*3/uL (ref 0.00–0.07)
Basophils Absolute: 0 10*3/uL (ref 0.0–0.1)
Basophils Relative: 0 %
Eosinophils Absolute: 0 10*3/uL (ref 0.0–0.5)
Eosinophils Relative: 0 %
HCT: 33.1 % — ABNORMAL LOW (ref 39.0–52.0)
Hemoglobin: 11.1 g/dL — ABNORMAL LOW (ref 13.0–17.0)
Immature Granulocytes: 1 %
Lymphocytes Relative: 18 %
Lymphs Abs: 1.4 10*3/uL (ref 0.7–4.0)
MCH: 34.3 pg — ABNORMAL HIGH (ref 26.0–34.0)
MCHC: 33.5 g/dL (ref 30.0–36.0)
MCV: 102.2 fL — ABNORMAL HIGH (ref 80.0–100.0)
Monocytes Absolute: 0.6 10*3/uL (ref 0.1–1.0)
Monocytes Relative: 7 %
Neutro Abs: 5.6 10*3/uL (ref 1.7–7.7)
Neutrophils Relative %: 74 %
Platelets: 137 10*3/uL — ABNORMAL LOW (ref 150–400)
RBC: 3.24 MIL/uL — ABNORMAL LOW (ref 4.22–5.81)
RDW: 16.5 % — ABNORMAL HIGH (ref 11.5–15.5)
WBC: 7.6 10*3/uL (ref 4.0–10.5)
nRBC: 0.3 % — ABNORMAL HIGH (ref 0.0–0.2)

## 2021-08-14 LAB — BASIC METABOLIC PANEL
Anion gap: 8 (ref 5–15)
BUN: 27 mg/dL — ABNORMAL HIGH (ref 8–23)
CO2: 22 mmol/L (ref 22–32)
Calcium: 8.4 mg/dL — ABNORMAL LOW (ref 8.9–10.3)
Chloride: 109 mmol/L (ref 98–111)
Creatinine, Ser: 0.82 mg/dL (ref 0.61–1.24)
GFR, Estimated: >60 mL/min (ref >60)
Glucose, Bld: 96 mg/dL (ref 70–99)
Potassium: 4.1 mmol/L (ref 3.5–5.1)
Sodium: 139 mmol/L (ref 135–145)

## 2021-08-14 LAB — MAGNESIUM: Magnesium: 2.4 mg/dL (ref 1.7–2.4)

## 2021-08-14 MED ORDER — PREDNISONE 10 MG PO TABS: ORAL_TABLET | ORAL | 0 refills | Status: DC

## 2021-08-14 MED ORDER — AZITHROMYCIN 250 MG PO TABS
500.0000 mg | ORAL_TABLET | Freq: Every day | ORAL | Status: DC
Start: 2021-08-14 — End: 2021-08-14
  Administered 2021-08-14: 500 mg via ORAL
  Filled 2021-08-14: qty 2

## 2021-08-14 MED ORDER — AMOXICILLIN-POT CLAVULANATE 875-125 MG PO TABS: 1.0000 | ORAL_TABLET | Freq: Two times a day (BID) | ORAL | 0 refills | Status: AC

## 2021-08-14 MED ORDER — AZITHROMYCIN 250 MG PO TABS: ORAL_TABLET | ORAL | 0 refills | Status: DC

## 2021-08-14 MED ORDER — HYDROCOD POLI-CHLORPHE POLI ER 10-8 MG/5ML PO SUER
5.0000 mL | Freq: Two times a day (BID) | ORAL | Status: DC | PRN
  Administered 2021-08-14: 5 mL via ORAL
  Filled 2021-08-14: qty 115

## 2021-08-14 NOTE — Discharge Summary (Signed)
?Physician Discharge Summary ?  ?Patient: Peter Greene MRN: [id number] DOB: 30-Jun-1940  ?Admit date:     08/11/2021  ?Discharge date: 08/14/21  ?Discharge Physician: Marylu Lund  ? ?PCP: Burnard Bunting, MD  ? ?Recommendations at discharge:  ? ? Follow up with PCP in 1-2 weeks ?Follow up with Pulmonary as scheduled ? ?Discharge Diagnoses: ?Principal Problem: ?  PNA (pneumonia) ?Active Problems: ?  Sepsis due to pneumonia [facility]) ?  Acute respiratory failure with hypoxia ([place]) ?  ILD (interstitial lung disease) ([place]) ?  Rhinovirus infection ?  Macrocytic anemia ?  Hypothyroidism ?  HTN (hypertension) ?  A-fib ([place]) ?  MDS (myelodysplastic syndrome) ([place]) ? ?Resolved Problems: ?  AKI (acute kidney injury) ([place]) ? ?Hospital Course: ?Mr. 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  He was found to be hypoxic in the 70s to 80s and was placed on oxygen on admission.  He is typically not on oxygen at baseline. ?He has been on a prolonged steroid taper over the past several months from his prior pulmonologist.  He has recently established care with [name] in [place] but has not yet been seen. ?On admission he was found to have severe right-sided pneumonia on imaging.  Strep pneumo urinary antigen was also positive along with respiratory swab positive for enterovirus/rhinovirus.  He was started on antibiotics, steroids, breathing treatments, and oxygen.  Pulmonology was also consulted on admission. ? ?Assessment and Plan: ?* PNA (pneumonia) ?- cont course of abx per PCCM recs ? ?Sepsis due to pneumonia [facility]) ?- multifocal consolidation noted on CT chest notably in RML/RLL ?-Appreciate pulmonology assistance ?- Continue Rocephin and azithromycin (can transition to oral azithromycin and Augmentin to complete courses at discharge. See pulm note) ?- Continue steroids (needs taper at discharge) ?- Continue  oxygen, see respiratory failure ? ?Acute respiratory failure with hypoxia ([place]) ?- Not on oxygen at baseline.  Etiology presumed to large right-sided pneumonia ?- See treatment for pneumonia and sepsis ?- Weaned o2 to room air at time of d/c ?- Underwent walk test with nursing on 5/17, able to ambulate on room air ?-Pulm recs to complete course of abx per below ? ?Rhinovirus infection ?- continue steroids with gradual taper down to baseline '1mg'$  prednisone ? ? ?ILD (interstitial lung disease) ([place]) ?- Appreciate pulmonology assistance ?- Planning to establish with [name] on 08/20/2021 ?-Previously treated in [place] by [name] ?-Per pulmonology, will need 5-day taper of prednisone back down to baseline of 1 mg daily ? 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 ?A-fib ([place]) ?- continue eliquis  ?- lopressor initially on hold, resume on d/c as bp improved ? ?HTN (hypertension) ?- lopressor on hold initially, resume on d/c ? ?Hypothyroidism ?- continue synthroid  ? ?Macrocytic anemia ?- Baseline hemoglobin 11 to 12 g/dL ?- Currently at baseline ?-Folate 37, B12 379 ? ?AKI (acute kidney injury) (HCC)-resolved as of 08/13/2021 ?- baseline creatinine ~ 1 ?- patient presents with increase in creat >0.3 mg/dL above baseline, creat increase >1.5x baseline presumed to have occurred within past 7 days PTA ?- back to baseline  ? ? ? ?  ? ? ?Consultants: Pulmonary ?Procedures performed:   ?Disposition: Home ?Diet recommendation:  ?Cardiac diet ?DISCHARGE MEDICATION: ?Allergies as of 08/14/2021   ?No Known Allergies ?  ? ?  ?  Medication List  ?  ? ?TAKE these medications   ? ?acetaminophen 500 MG tablet ?Commonly known as: TYLENOL ?Take 500 mg by mouth every 6 (six) hours as needed for moderate pain. ?  ?amoxicillin-clavulanate 875-125 MG tablet ?Commonly known as: AUGMENTIN ?Take 1 tablet by mouth 2 (two) times daily for 5  days. ?  ?atorvastatin 40 MG tablet ?Commonly known as: LIPITOR ?Take 40 mg by mouth daily. ?  ?azithromycin 250 MG tablet ?Commonly known as: ZITHROMAX ?1 tab po daily x 2 more days, zero refills ?Start taking on: Aug 15, 2021 ?  ?diphenhydramine-acetaminophen 25-500 MG Tabs tablet ?Commonly known as: TYLENOL PM ?Take 1 tablet by mouth at bedtime as needed (sleep). ?  ?Eliquis 5 MG Tabs tablet ?Generic drug: apixaban ?Take 5 mg by mouth 2 (two) times daily. ?  ?gabapentin 100 MG capsule ?Commonly known as: NEURONTIN ?Take 100 mg by mouth 3 (three) times daily. ?  ?ketotifen 0.025 % ophthalmic solution ?Commonly known as: ZADITOR ?Place 1 drop into the right eye 2 (two) times daily. ?  ?levothyroxine 25 MCG tablet ?Commonly known as: SYNTHROID ?Take 25 mcg by mouth daily. ?  ?metoprolol tartrate 25 MG tablet ?Commonly known as: LOPRESSOR ?Take 25 mg by mouth 2 (two) times daily. ?  ?multivitamin with minerals Tabs tablet ?Take 1 tablet by mouth daily. Centrum ?  ?nystatin 100000 UNIT/ML suspension ?Commonly known as: MYCOSTATIN ?Use as directed 5 mLs in the mouth or throat daily. ?  ?pantoprazole 20 MG tablet ?Commonly known as: PROTONIX ?Take 20 mg by mouth daily. ?  ?predniSONE 1 MG tablet ?Commonly known as: DELTASONE ?Take 1 mg by mouth daily. ?What changed: Another medication with the same name was added. Make sure you understand how and when to take each. ?  ?predniSONE 10 MG tablet ?Commonly known as: DELTASONE ?Take 4 tablets (40 mg total) by mouth daily with breakfast for 2 days, THEN 2 tablets (20 mg total) daily with breakfast for 2 days, THEN 1 tablet (10 mg total) daily with breakfast for 2 days, THEN 0.5 tablets (5 mg total) daily with breakfast for 2 days. Continue originally prescribed '1mg'$  prednisone after completing taper. ?Start taking on: Aug 14, 2021 ?What changed: You were already taking a medication with the same name, and this prescription was added. Make sure you understand how and when to  take each. ?  ?SYSTANE BALANCE OP ?Place 1 drop into both eyes daily as needed (dry eyes). ?  ?tamsulosin 0.4 MG Caps capsule ?Commonly known as: FLOMAX ?0.4 mg daily. ?  ?valACYclovir 500 MG tablet ?Commonly known as: VALTREX ?Take 500 mg by mouth daily. ?  ? ?  ? ?  ?  ? ? ?  ?Durable Medical Equipment  ?(From admission, onward)  ?  ? ? ?  ? ?  Start     Ordered  ? 08/13/21 1556  For home use only DME oxygen  Once       ?Question Answer Comment  ?Length of Need 6 Months   ?Mode or (Route) Nasal cannula   ?Liters per Minute 2   ?Frequency Continuous (stationary and portable oxygen unit needed)   ?Oxygen conserving device Yes   ?Oxygen delivery system Gas   ?  ? 08/13/21 1556  ? ?  ?  ? ?  ? ? Follow-up Information   ? ? Burnard Bunting, MD Follow up in 2 week(s).   ?Specialty: Internal Medicine ?Why: Hospital follow up ?Contact information: ?[address] ?[place]  [address] ?[phone] ? ? ?  ?  ? ? Marshell Garfinkel, MD Follow up on 08/20/2021.   ?Specialty: Pulmonary Disease ?Why: at 9:30 ?Contact information: ?[address] ?[address] ?[place] [address] ?[phone] ? ? ?  ?  ? ?  ?  ? ?  ? ?Discharge Exam: ?Filed Weights  ? 08/11/21 1104 08/11/21 1900  ?Weight: 90.7 kg 94.5 kg  ? ?General exam: Awake, laying in bed, in nad ?Respiratory system: Normal respiratory effort, no wheezing ?Cardiovascular system: regular rate, s1, s2 ?Gastrointestinal system: Soft, nondistended, positive BS ?Central nervous system: CN2-12 grossly intact, strength intact ?Extremities: Perfused, no clubbing ?Skin: Normal skin turgor, no notable skin lesions seen ?Psychiatry: Mood normal // no visual hallucinations  ? ?Condition at discharge: fair ? ?The results of significant diagnostics from this hospitalization (including imaging, microbiology, ancillary and laboratory) are listed below for reference.  ? ?Imaging Studies: ?CT CHEST WO CONTRAST ? ?Result Date: 08/11/2021 ?CLINICAL DATA:  Short of breath since last night.  Opacity at the right lung base on the current chest radiograph. EXAM: CT CHEST WITHOUT CONTRAST TECHNIQUE: Multidetector CT imaging of the chest was performed following the standard protocol without IV contrast. R

## 2021-08-14 NOTE — Progress Notes (Signed)
Discharge instructions discussed with patient and family verbalized agreement and understanding ?

## 2021-08-14 NOTE — Progress Notes (Signed)
SATURATION QUALIFICATIONS: (This note is used to comply with regulatory documentation for home oxygen)  Patient Saturations on Room Air at Rest = 92%  Patient Saturations on Room Air while Ambulating = 90%   

## 2021-08-15 ENCOUNTER — Ambulatory Visit: Payer: Medicare Other | Admitting: Physical Therapy

## 2021-08-16 LAB — CULTURE, BLOOD (ROUTINE X 2)
Culture: NO GROWTH
Culture: NO GROWTH
Special Requests: ADEQUATE

## 2021-08-19 ENCOUNTER — Ambulatory Visit: Payer: Medicare Other | Admitting: Physical Therapy

## 2021-08-20 ENCOUNTER — Ambulatory Visit (INDEPENDENT_AMBULATORY_CARE_PROVIDER_SITE_OTHER): Payer: Medicare Other | Admitting: Pulmonary Disease

## 2021-08-20 ENCOUNTER — Encounter: Payer: Self-pay | Admitting: Pulmonary Disease

## 2021-08-20 VITALS — BP 116/60 | HR 76 | Temp 97.7°F | Ht 68.0 in | Wt 200.6 lb

## 2021-08-20 DIAGNOSIS — J849 Interstitial pulmonary disease, unspecified: Secondary | ICD-10-CM

## 2021-08-20 MED ORDER — AZITHROMYCIN 250 MG PO TABS: ORAL_TABLET | ORAL | 0 refills | Status: DC

## 2021-08-20 NOTE — Progress Notes (Signed)
Peter Greene    [id number]    [birth year]  Primary Care Physician:Aronson, Delfino Lovett, MD  Referring Physician: Burnard Bunting, MD [address] [place],  [place] [address]  Chief complaint: Consult for interstitial lung disease  HPI: 81 year old with history of laryngeal cancer, atrial fibrillation, myelodysplastic syndrome.  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  Used to follow with Dr.Thomas J. 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  He appears to have had surgical lung biopsy in the past but the patient does not recall the date of the procedure.  He is currently not on any specific treatments for interstitial lung disease and is transferring care as he moved [place] to be closer to his family.  He was hospitalized in the middle of May 2023 with sepsis, community-acquired pneumonia with CT scan showing focal consolidations from baseline vesicular.  Years.  Treated with Rocephin, Azithromycin and sent home on Augmentin with a steroid taper.  Overall he feels improved and back to baseline  Pets: No pets Occupation: Retired Music therapist Exposures: No mold exposures.  No mold, hot tub, Jacuzzi.  No feather pillows or comforters ILD questionnaire 08/20/2021-negative Smoking history: 20-pack-year smoker.  Quit in 1984 Travel history: Used to live in [place], [place].  No significant recent travel Relevant family history: No family history of lung disease  Outpatient Encounter Medications as of 08/20/2021  Medication Sig   acetaminophen (TYLENOL) 500 MG tablet Take 500 mg by mouth every 6 (six) hours as needed for moderate pain.   atorvastatin (LIPITOR) 40 MG tablet Take 40 mg by mouth daily.    diphenhydramine-acetaminophen (TYLENOL PM) 25-500 MG TABS tablet Take 1 tablet by mouth at bedtime as needed (sleep).   ELIQUIS 5 MG TABS tablet Take 5 mg by mouth 2 (two) times daily.   gabapentin (NEURONTIN) 100 MG capsule Take 100 mg by mouth 3 (three) times daily.   ketotifen (ZADITOR) 0.025 % ophthalmic solution Place 1 drop into the right eye 2 (two) times daily.   levothyroxine (SYNTHROID) 25 MCG tablet Take 25 mcg by mouth daily.   metoprolol tartrate (LOPRESSOR) 25 MG tablet Take 25 mg by mouth 2 (two) times daily.   Multiple Vitamin (MULTIVITAMIN WITH MINERALS) TABS tablet Take 1 tablet by mouth daily. Centrum   nystatin (MYCOSTATIN) 100000 UNIT/ML suspension Use as directed 5 mLs in the mouth or throat daily.   pantoprazole (PROTONIX) 20 MG tablet Take 20 mg by mouth daily.   predniSONE (DELTASONE) 1 MG tablet Take 1 mg by mouth daily.   Propylene Glycol (SYSTANE BALANCE OP) Place 1 drop into both eyes daily as needed (dry eyes).   tamsulosin (FLOMAX) 0.4 MG CAPS capsule 0.4 mg daily.   valACYclovir (VALTREX) 500 MG tablet Take 500 mg by mouth daily.   [DISCONTINUED] azithromycin (ZITHROMAX) 250 MG tablet 1 tab po daily x 2 more days, zero refills   [DISCONTINUED] predniSONE (DELTASONE) 10 MG tablet Take 4 tablets (40 mg total) by mouth daily with breakfast for 2 days, THEN 2 tablets (20 mg total) daily with breakfast for 2 days, THEN 1 tablet (10 mg total) daily with breakfast for 2 days, THEN 0.5 tablets (5 mg total) daily with breakfast for 2 days. Continue originally prescribed '1mg'$  prednisone after  completing taper.   No facility-administered encounter medications on file as of 08/20/2021.    Allergies as of 08/20/2021   (No Known Allergies)    No past medical history on file.  No past surgical history on file.  No family history on file.  Social History   Socioeconomic History   Marital status: Married    Spouse name: Not on file   Number of children: Not on file    Years of education: Not on file   Highest education level: Not on file  Occupational History   Not on file  Tobacco Use   Smoking status: Former    Types: Cigarettes    Passive exposure: Never   Smokeless tobacco: Not on file  Substance and Sexual Activity   Alcohol use: Not on file   Drug use: Not on file   Sexual activity: Not on file  Other Topics Concern   Not on file  Social History Narrative   Not on file   Social Determinants of Health   Financial Resource Strain: Not on file  Food Insecurity: Not on file  Transportation Needs: Not on file  Physical Activity: Not on file  Stress: Not on file  Social Connections: Not on file  Intimate Partner Violence: Not on file    Review of systems: Review of Systems  Constitutional: Negative for fever and chills.  HENT: Negative.   Eyes: Negative for blurred vision.  Respiratory: as per HPI  Cardiovascular: Negative for chest pain and palpitations.  Gastrointestinal: Negative for vomiting, diarrhea, blood per rectum. Genitourinary: Negative for dysuria, urgency, frequency and hematuria.  Musculoskeletal: Negative for myalgias, back pain and joint pain.  Skin: Negative for itching and rash.  Neurological: Negative for dizziness, tremors, focal weakness, seizures and loss of consciousness.  Endo/Heme/Allergies: Negative for environmental allergies.  Psychiatric/Behavioral: Negative for depression, suicidal ideas and hallucinations.  All other systems reviewed and are negative.  Physical Exam: Blood pressure 116/60, pulse 76, temperature 97.7 F (36.5 C), temperature source Oral, height '5\' 8"'$  (1.727 m), weight 200 lb 9.6 oz (91 kg), SpO2 96 %. Gen:      No acute distress HEENT:  EOMI, sclera anicteric Neck:     No masses; no thyromegaly Lungs:    Clear to auscultation bilaterally; normal respiratory effort CV:         Regular rate and rhythm; no murmurs Abd:      + bowel sounds; soft, non-tender; no palpable masses, no  distension Ext:    No edema; adequate peripheral perfusion Skin:      Warm and dry; no rash Neuro: alert and oriented x 3 Psych: normal mood and affect  Data Reviewed: Imaging: CT chest 08/11/2021-multifocal areas of consolidation mostly in the right middle lobe and lower lobes.  Heterogeneous peripheral reticulation.  I have reviewed the images personally.  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  He is currently not on treatment now.  He is also recovering from recent admission for multifocal pneumonia We will get records from his previous pulmonologist for review Schedule high-res CT and PFTs for evaluation.  Plan/Recommendations: Obtain records High-res CT, PFTs  Marshell Garfinkel MD [facility] 08/20/2021, 9:55 AM  CC: Burnard Bunting, MD

## 2021-08-20 NOTE — Patient Instructions (Addendum)
We will get records from your pulmonologist for review Schedule high res CT and PFTs in three months and follow-up in clinic after these tests

## 2021-08-22 ENCOUNTER — Encounter: Payer: Self-pay | Admitting: Physical Therapy

## 2021-08-22 ENCOUNTER — Ambulatory Visit: Payer: Medicare Other | Admitting: Physical Therapy

## 2021-08-22 DIAGNOSIS — M6281 Muscle weakness (generalized): Secondary | ICD-10-CM

## 2021-08-22 DIAGNOSIS — R208 Other disturbances of skin sensation: Secondary | ICD-10-CM | POA: Diagnosis present

## 2021-08-22 DIAGNOSIS — R2681 Unsteadiness on feet: Secondary | ICD-10-CM | POA: Diagnosis present

## 2021-08-22 NOTE — Therapy (Signed)
[address] [place], [place], [address] Phone: [phone]   Fax:  [phone]  Physical Therapy Treatment  Patient Details  Name: Peter Greene MRN: [id number] Date of Birth: 02-02-1941 Referring Provider (PT): Fleet Contras   Encounter Date: 08/22/2021   PT End of Session - 08/22/21 0927     Visit Number 6    Date for PT Re-Evaluation 09/02/21    Authorization Type MCR and [employer]    Authorization Time Period 07/22/21 to 09/02/21    PT Start Time 0845    PT Stop Time 0930    PT Time Calculation (min) 45 min    Activity Tolerance Patient tolerated treatment well    Behavior During Therapy [facility] for tasks assessed/performed             History reviewed. No pertinent past medical history.  History reviewed. No pertinent surgical history.  There were no vitals filed for this visit.   Subjective Assessment - 08/22/21 0848     Subjective Quads are tired all the time not sure why. Feeling ok overall.    Currently in Pain? No/denies                               [facility] Adult PT Treatment/Exercise - 08/22/21 0001       Lumbar Exercises: Stretches   Passive Hamstring Stretch Right;Left;4 reps;20 seconds;10 seconds    Single Knee to Chest Stretch Left;Right;2 reps;10 seconds    Lower Trunk Rotation 3 reps;10 seconds      Lumbar Exercises: Aerobic   Nustep L5 x 6 minutes      Lumbar Exercises: Machines for Strengthening   Cybex Knee Extension 2 sets 10  10#    Cybex Knee Flexion 2 sets 10 25#      Lumbar Exercises: Standing   Row Strengthening;Power tower;Both;20 reps    Row Limitations 10    Shoulder Extension Strengthening;Power Tower;20 reps;Both    Shoulder Extension Limitations 10      Lumbar Exercises: Supine   Bridge Compliant;10 reps;2 seconds                       PT Short Term Goals - 08/22/21 0927       PT SHORT TERM GOAL #1   Title Will be  compliant with appropriate progressive HEP    Status Achieved               PT Long Term Goals - 08/22/21 0927       PT LONG TERM GOAL #1   Title MMT to improve by at least one grade in all weak groups    Status On-going      PT LONG TERM GOAL #2   Title Will score 100% on Berg balance test and at least 22/24 on DGI to show improved functional balance skills    Status On-going      PT LONG TERM GOAL #3   Title Will be compliant with advanced customized gym program prior to DC    Status Partially Met                   Plan - 08/22/21 0927     Clinical Impression Statement Pt returns to therapy after spending three day in the hospital. Pt has a forward head and rounded shoulders at rest. Tactile cue for posture needed with standing shoulder  extensions.Cues not to allow LE to push against stable with sit to stands. Some weakness noted with supine bridges. Bilateral HS tightness present with passive stretching.    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, hx radiation and chemo tx    Examination-Activity Limitations Locomotion Level;Transfers    Examination-Participation Restrictions Community Activity;Yard Work    [employer]chant [employer] officer Evolving/Moderate complexity    Rehab Potential Good    PT Frequency 2x / week    PT Treatment/Interventions ADLs/Self Care Home Management;Cryotherapy;Moist Heat;Ultrasound;Gait training;Stair training;Functional mobility training;Therapeutic activities;Therapeutic exercise;Balance training;Neuromuscular re-education;Patient/family education;Manual techniques;Passive range of motion;Dry needling;Energy conservation;Taping    PT Next Visit Plan Continue to progress stretch and strengthening activities.             Patient will benefit from skilled therapeutic intervention in order to improve  the following deficits and impairments:  Decreased coordination, Increased fascial restricitons, Decreased activity tolerance, Pain, Decreased balance, Hypomobility, Impaired flexibility, Improper body mechanics, Decreased mobility, Decreased strength, Postural dysfunction  Visit Diagnosis: Muscle weakness (generalized)  Unsteadiness on feet  Other disturbances of skin sensation     Problem List Patient Active Problem List   Diagnosis Date Noted   Rhinovirus infection 08/12/2021   MDS (myelodysplastic syndrome) ([place]) 08/12/2021   Acute respiratory failure with hypoxia ([place]) 08/12/2021   PNA (pneumonia) 08/11/2021   Sepsis due to pneumonia ([place]) 08/11/2021   Macrocytic anemia 08/11/2021   Hypothyroidism 08/11/2021   HTN (hypertension) 08/11/2021   ILD (interstitial lung disease) ([place]) 08/11/2021   A-fib ([place]) 08/11/2021    Scot Jun, PTA 08/22/2021, 9:30 AM  [place]. [place], [place], [address] Phone: [phone]   Fax:  [phone]  Name: Peter Greene MRN: [id number] Date of Birth: Oct 07, 1940

## 2021-09-03 ENCOUNTER — Ambulatory Visit: Payer: Medicare Other | Admitting: Physical Therapy

## 2021-09-03 ENCOUNTER — Encounter: Payer: Self-pay | Admitting: Physical Therapy

## 2021-09-03 ENCOUNTER — Ambulatory Visit: Payer: Medicare Other | Attending: Neurology | Admitting: Physical Therapy

## 2021-09-03 DIAGNOSIS — R208 Other disturbances of skin sensation: Secondary | ICD-10-CM | POA: Insufficient documentation

## 2021-09-03 DIAGNOSIS — R2681 Unsteadiness on feet: Secondary | ICD-10-CM | POA: Insufficient documentation

## 2021-09-03 DIAGNOSIS — M6281 Muscle weakness (generalized): Secondary | ICD-10-CM | POA: Insufficient documentation

## 2021-09-03 NOTE — Therapy (Signed)
[place]. [place], [place], [address] Phone: [phone]   Fax:  [phone]  Physical Therapy Treatment  Patient Details  Name: Peter Greene MRN: [id number] Date of Birth: 05-25-1940 Referring Provider (PT): Fleet Contras   Encounter Date: 09/03/2021   PT End of Session - 09/03/21 1427     Visit Number 7    Date for PT Re-Evaluation 10/03/21    Authorization Time Period 07/22/21 to 09/02/21    PT Start Time 8882    PT Stop Time 1427    PT Time Calculation (min) 42 min    Behavior During Therapy [facility] for tasks assessed/performed             History reviewed. No pertinent past medical history.  History reviewed. No pertinent surgical history.  There were no vitals filed for this visit.   Subjective Assessment - 09/03/21 1346     Subjective "I feel fine"    Currently in Pain? No/denies                [facility] PT Assessment - 09/03/21 0001       Strength   Right Hip Extension 4-/5    Left Hip Extension 4-/5    Left Hip ABduction 4-/5                           [facility] Adult PT Treatment/Exercise - 09/03/21 0001       Lumbar Exercises: Aerobic   Recumbent Bike L5.9 x5 min    Nustep L5 x 4 minutes      Lumbar Exercises: Machines for Strengthening   Cybex Knee Extension 2 sets 10  10#    Cybex Knee Flexion 2 sets 10 35#    Other Lumbar Machine Exercise lat pull and row 25 # 2 sets 10      Lumbar Exercises: Standing   Row Strengthening;Power tower;Both;20 reps    Row Limitations 15    Shoulder Extension Strengthening;Power Tower;20 reps;Both    Shoulder Extension Limitations 10    Other Standing Lumbar Exercises Resisted gait 40lb 4 way x 3 each    Other Standing Lumbar Exercises Step ups on 6" step x10 each                       PT Short Term Goals - 08/22/21 8003       PT SHORT TERM GOAL #1   Title Will be compliant with appropriate progressive  HEP    Status Achieved               PT Long Term Goals - 09/03/21 1428       PT LONG TERM GOAL #1   Title MMT to improve by at least one grade in all weak groups    Status Partially Met      PT LONG TERM GOAL #2   Title Will score 100% on Berg balance test and at least 22/24 on DGI to show improved functional balance skills    Status Partially Met                   Plan - 09/03/21 1429     Clinical Impression Statement Pt has progressed increasing his LE strength. He continues to have forward rounded shoulders. Session focuses on LE and postural strength. Tactile cue required with shoulder Ext to prevent trunk flexion. No reports of pain reported throughout  session. Increase resistance tolerated with hamstring curls and seated rows/Lats. Some instability present with resisted gait.    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, hx radiation and chemo tx    Examination-Activity Limitations Locomotion Level;Transfers    Examination-Participation Restrictions Community Activity;Yard Work    [employer]chant [employer] officer Evolving/Moderate complexity    Rehab Potential Good    PT Frequency 2x / week    PT Duration 6 weeks    PT Treatment/Interventions ADLs/Self Care Home Management;Cryotherapy;Moist Heat;Ultrasound;Gait training;Stair training;Functional mobility training;Therapeutic activities;Therapeutic exercise;Balance training;Neuromuscular re-education;Patient/family education;Manual techniques;Passive range of motion;Dry needling;Energy conservation;Taping    PT Next Visit Plan Continue to progress stretch and strengthening activities.             Patient will benefit from skilled therapeutic intervention in order to improve the following deficits and impairments:  Decreased coordination, Increased fascial restricitons, Decreased  activity tolerance, Pain, Decreased balance, Hypomobility, Impaired flexibility, Improper body mechanics, Decreased mobility, Decreased strength, Postural dysfunction  Visit Diagnosis: Unsteadiness on feet - Plan: PT plan of care cert/re-cert  Muscle weakness (generalized) - Plan: PT plan of care cert/re-cert  Other disturbances of skin sensation - Plan: PT plan of care cert/re-cert     Problem List Patient Active Problem List   Diagnosis Date Noted   Rhinovirus infection 08/12/2021   MDS (myelodysplastic syndrome) ([place]) 08/12/2021   Acute respiratory failure with hypoxia ([place]) 08/12/2021   PNA (pneumonia) 08/11/2021   Sepsis due to pneumonia ([place]) 08/11/2021   Macrocytic anemia 08/11/2021   Hypothyroidism 08/11/2021   HTN (hypertension) 08/11/2021   ILD (interstitial lung disease) ([place]) 08/11/2021   A-fib ([place]) 08/11/2021    Sumner Boast, PT 09/03/2021, 3:24 PM  [place]. [place], [place], [address] Phone: [phone]   Fax:  [phone]  Name: Peter Greene MRN: [id number] Date of Birth: 25-Mar-1941

## 2021-09-06 ENCOUNTER — Encounter: Payer: Self-pay | Admitting: Physical Therapy

## 2021-09-06 ENCOUNTER — Ambulatory Visit: Payer: Medicare Other | Admitting: Physical Therapy

## 2021-09-06 DIAGNOSIS — R2681 Unsteadiness on feet: Secondary | ICD-10-CM

## 2021-09-06 DIAGNOSIS — M6281 Muscle weakness (generalized): Secondary | ICD-10-CM

## 2021-09-06 DIAGNOSIS — R208 Other disturbances of skin sensation: Secondary | ICD-10-CM

## 2021-09-06 NOTE — Therapy (Signed)
[place]. [place], [place], [address] Phone: [phone]   Fax:  [phone]  Physical Therapy Treatment  Patient Details  Name: Peter Greene MRN: [id number] Date of Birth: May 27, 1940 Referring Provider (PT): Fleet Contras   Encounter Date: 09/06/2021   PT End of Session - 09/06/21 0928     Visit Number 8    Date for PT Re-Evaluation 10/03/21    PT Start Time 0844    PT Stop Time 0930    PT Time Calculation (min) 46 min    Activity Tolerance Patient tolerated treatment well    Behavior During Therapy [facility] for tasks assessed/performed             History reviewed. No pertinent past medical history.  History reviewed. No pertinent surgical history.  There were no vitals filed for this visit.   Subjective Assessment - 09/06/21 0850     Subjective Just sore in the quads and lower back.    Currently in Pain? No/denies                               [facility] Adult PT Treatment/Exercise - 09/06/21 0001       Lumbar Exercises: Stretches   Single Knee to Chest Stretch Left;Right;10 seconds;4 reps    Lower Trunk Rotation 3 reps;10 seconds      Lumbar Exercises: Aerobic   Nustep L5 x 6 minutes      Lumbar Exercises: Standing   Shoulder Extension Strengthening;Power Tower;20 reps;Both    Shoulder Extension Limitations 10    Other Standing Lumbar Exercises Resisted gait 40lb 4 way x 3 each    Other Standing Lumbar Exercises Step ups forward & lateral on 6" step x10 each, Horiz Abd green 2x10      Lumbar Exercises: Seated   Sit to Stand 20 reps   OHP yellow ball     Lumbar Exercises: Supine   Bridge Compliant;10 reps;2 seconds    Other Supine Lumbar Exercises feet on ball obl and KTC 15 x each                       PT Short Term Goals - 08/22/21 2876       PT SHORT TERM GOAL #1   Title Will be compliant with appropriate progressive HEP    Status Achieved                PT Long Term Goals - 09/03/21 1428       PT LONG TERM GOAL #1   Title MMT to improve by at least one grade in all weak groups    Status Partially Met      PT LONG TERM GOAL #2   Title Will score 100% on Berg balance test and at least 22/24 on DGI to show improved functional balance skills    Status Partially Met                   Plan - 09/06/21 0929     Clinical Impression Statement Pt enters with reports of muscle fatigue in both quads that's never resolves. He has forward head and rounded shoulders at rest. No reports of increase pain during session. Cueing needed with functional activities. Tactile cues needed with shoulder Ext to prevent trunk flexion.    Personal Factors and Comorbidities Time since onset of injury/illness/exacerbation;Comorbidity 3+  Examination-Activity Limitations Locomotion Level;Transfers    Examination-Participation Restrictions Community Activity;Yard Work    [employer]chant [employer] officer Evolving/Moderate complexity    Rehab Potential Good    PT Frequency 2x / week    PT Duration 6 weeks    PT Treatment/Interventions ADLs/Self Care Home Management;Cryotherapy;Moist Heat;Ultrasound;Gait training;Stair training;Functional mobility training;Therapeutic activities;Therapeutic exercise;Balance training;Neuromuscular re-education;Patient/family education;Manual techniques;Passive range of motion;Dry needling;Energy conservation;Taping    PT Next Visit Plan Continue to progress stretch and strengthening activities.             Patient will benefit from skilled therapeutic intervention in order to improve the following deficits and impairments:  Decreased coordination, Increased fascial restricitons, Decreased activity tolerance, Pain, Decreased balance, Hypomobility, Impaired flexibility, Improper body mechanics, Decreased mobility, Decreased strength, Postural dysfunction  Visit Diagnosis: Unsteadiness on feet  Muscle  weakness (generalized)  Other disturbances of skin sensation     Problem List Patient Active Problem List   Diagnosis Date Noted   Rhinovirus infection 08/12/2021   MDS (myelodysplastic syndrome) ([place]) 08/12/2021   Acute respiratory failure with hypoxia ([place]) 08/12/2021   PNA (pneumonia) 08/11/2021   Sepsis due to pneumonia ([place]) 08/11/2021   Macrocytic anemia 08/11/2021   Hypothyroidism 08/11/2021   HTN (hypertension) 08/11/2021   ILD (interstitial lung disease) ([place]) 08/11/2021   A-fib ([place]) 08/11/2021    Scot Jun, PTA 09/06/2021, 9:35 AM  [place]. [place], [place], [address] Phone: [phone]   Fax:  [phone]  Name: Peter Greene MRN: [id number] Date of Birth: 1940-08-09

## 2021-09-09 ENCOUNTER — Ambulatory Visit: Payer: Medicare Other | Admitting: Physical Therapy

## 2021-09-10 ENCOUNTER — Ambulatory Visit: Payer: Medicare Other | Admitting: Physical Therapy

## 2021-09-10 ENCOUNTER — Encounter: Payer: Self-pay | Admitting: Physical Therapy

## 2021-09-10 DIAGNOSIS — R2681 Unsteadiness on feet: Secondary | ICD-10-CM

## 2021-09-10 DIAGNOSIS — M6281 Muscle weakness (generalized): Secondary | ICD-10-CM

## 2021-09-10 NOTE — Therapy (Signed)
[place]. [place], [place], [address] Phone: [phone]   Fax:  [phone]  Physical Therapy Treatment  Patient Details  Name: Peter Greene Date of Birth: 1940/08/19 Referring Provider (PT): Fleet Contras   Encounter Date: 09/10/2021   PT End of Session - 09/10/21 1423     Visit Number 9    Date for PT Re-Evaluation 10/03/21    PT Start Time 1335    PT Stop Time 1423    PT Time Calculation (min) 48 min    Activity Tolerance Patient tolerated treatment well    Behavior During Therapy [facility] for tasks assessed/performed             History reviewed. No pertinent past medical history.  History reviewed. No pertinent surgical history.  There were no vitals filed for this visit.   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 No/denies                [facility] PT Assessment - 09/10/21 0001       Strength   Right Hip Extension 4/5    Left Hip Extension 4+/5    Left Hip ABduction 5/5      Berg Balance Test   Sit to Stand Able to stand without using hands and stabilize independently    Standing Unsupported Able to stand safely 2 minutes    Sitting with Back Unsupported but Feet Supported on Floor or Stool Able to sit safely and securely 2 minutes    Stand to Sit Sits safely with minimal use of hands    Transfers Able to transfer safely, minor use of hands    Standing Unsupported with Eyes Closed Able to stand 10 seconds safely    Standing Unsupported with Feet Together Able to place feet together independently and stand 1 minute safely    From Standing, Reach Forward with Outstretched Arm Can reach confidently >25 cm (10")    From Standing Position, Pick up Object from Floor Able to pick up shoe safely and easily    From Standing Position, Turn to Look Behind Over each Shoulder Looks behind  from both sides and weight shifts well    Turn 360 Degrees Able to turn 360 degrees safely in 4 seconds or less    Standing Unsupported, Alternately Place Feet on Step/Stool Able to stand independently and safely and complete 8 steps in 20 seconds    Standing Unsupported, One Foot in Front Able to place foot tandem independently and hold 30 seconds    Standing on One Leg Able to lift leg independently and hold equal to or more than 3 seconds    Total Score 54      Dynamic Gait Index   Level Surface Normal    Change in Gait Speed Normal    Gait with Horizontal Head Turns Normal    Gait with Vertical Head Turns Normal    Gait and Pivot Turn Mild Impairment    Step Over Obstacle Normal    Step Around Obstacles Mild Impairment    Steps Normal    Total Score 22                           [facility] Adult PT Treatment/Exercise - 09/10/21 0001       Lumbar Exercises:  Aerobic   Recumbent Bike L4.5 x 6      Lumbar Exercises: Machines for Strengthening   Cybex Knee Extension 2 sets 15  10#    Cybex Knee Flexion 2 sets 15 35#    Other Lumbar Machine Exercise lat pull and row 35 # 2 sets 10                       PT Short Term Goals - 08/22/21 0927       PT SHORT TERM GOAL #1   Title Will be compliant with appropriate progressive HEP    Status Achieved               PT Long Term Goals - 09/10/21 1355       PT LONG TERM GOAL #2   Title Will score 100% on Berg balance test and at least 22/24 on DGI to show improved functional balance skills    Status Partially Met      PT LONG TERM GOAL #3   Title Will be compliant with advanced customized gym program prior to DC    Status Partially Met                   Plan - 09/10/21 1423     Clinical Impression Statement Pt has progressed towards all LTG's. Pt has increased his DGI and BERG balance scores. Single leg stance was the most difficult for patient. Increase resistance and or reps with all  machine level interventions.    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, hx radiation and chemo tx    Examination-Activity Limitations Locomotion Level;Transfers    Examination-Participation Restrictions Community Activity;Yard Work    Merchant navy officer Evolving/Moderate complexity    Rehab Potential Good    PT Frequency 2x / week    PT Duration 6 weeks    PT Treatment/Interventions ADLs/Self Care Home Management;Cryotherapy;Moist Heat;Ultrasound;Gait training;Stair training;Functional mobility training;Therapeutic activities;Therapeutic exercise;Balance training;Neuromuscular re-education;Patient/family education;Manual techniques;Passive range of motion;Dry needling;Energy conservation;Taping    PT Next Visit Plan Continue to progress stretch and strengthening activities.             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 [place], [place], [address] Phone: [phone]   Fax:  [phone]  Name: Peter Greene MRN: [id number] Date of Birth: 1941/02/17

## 2021-09-12 ENCOUNTER — Encounter: Payer: Self-pay | Admitting: Physical Therapy

## 2021-09-12 ENCOUNTER — Ambulatory Visit: Payer: Medicare Other | Admitting: Physical Therapy

## 2021-09-12 DIAGNOSIS — R2681 Unsteadiness on feet: Secondary | ICD-10-CM

## 2021-09-12 DIAGNOSIS — R208 Other disturbances of skin sensation: Secondary | ICD-10-CM

## 2021-09-12 DIAGNOSIS — M6281 Muscle weakness (generalized): Secondary | ICD-10-CM

## 2021-09-12 NOTE — Therapy (Signed)
[place]. [place], [place], [address] Phone: [phone]   Fax:  [phone]  Physical Therapy Treatment Progress Note Reporting Period 07/22/21 to 09/12/21  See note below for Objective Data and Assessment of Progress/Goals.     Patient Details  Name: Peter Greene MRN: [id number] Date of Birth: [birth year] Referring Provider (PT): Fleet Contras   Encounter Date: 09/12/2021   PT End of Session - 09/12/21 0920     Visit Number 10    Date for PT Re-Evaluation 10/03/21    PT Start Time 0845    PT Stop Time 0927    PT Time Calculation (min) 42 min    Activity Tolerance Patient tolerated treatment well    Behavior During Therapy [facility] for tasks assessed/performed             History reviewed. No pertinent past medical history.  History reviewed. No pertinent surgical history.  There were no vitals filed for this visit.   Subjective Assessment - 09/12/21 0847     Subjective Patient reports that yesterday while walking he noticed that his L quad pain was almost gone. 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 He is performing HEP as instructed.    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                       PT Short Term Goals - 09/12/21 0853       PT SHORT TERM GOAL #1   Title Will be compliant with appropriate progressive HEP    Status Achieved       PT SHORT TERM GOAL #2   Title Will be able to complete 5x sit to stand with no UEs in 13 seconds or less    Baseline 9.59    Status Achieved               PT Long Term Goals - 09/12/21 0900       PT LONG TERM GOAL #1   Title MMT to improve by at least one grade in all weak groups    Baseline Patient with good improvement, has increased strength by at least 1/2 m grade.    Time 2    Status On-going      PT LONG TERM GOAL #2   Title Will score 100% on Berg balance test and at least 22/24 on DGI to show improved functional balance skills    Baseline 54/56 BERG, 22/24 on DGI    Time 2    Period Weeks    Status Partially Met      PT LONG TERM GOAL #3   Title Will be compliant with advanced customized gym program prior to DC  Time 2    Status On-going                   Plan - 09/12/21 0920     Clinical Impression Statement Patient's status re-assessed for 10V. He is progressing well toward all LTG. Initiating transition to education for continuing trianing in gym. He requires VC to slow down and catch his breath at times.    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, hx radiation and chemo tx    Examination-Activity Limitations Locomotion Level;Transfers    Examination-Participation Restrictions Community Activity;Yard Work    Biomedical scientist Low    Rehab Potential Good    PT Frequency 2x / week    PT Duration 2 weeks    PT Treatment/Interventions ADLs/Self Care Home Management;Cryotherapy;Moist Heat;Ultrasound;Gait training;Stair training;Functional mobility training;Therapeutic activities;Therapeutic exercise;Balance training;Neuromuscular re-education;Patient/family education;Manual techniques;Passive range of motion;Dry needling;Energy  conservation;Taping    PT Next Visit Plan Continue to progress stretch and strengthening activities.    PT Home Exercise Plan [id number]    Consulted and Agree with Plan of Care Patient             Patient will benefit from skilled therapeutic intervention in order to improve the following deficits and impairments:  Decreased coordination, Increased fascial restricitons, Decreased activity tolerance, Pain, Decreased balance, Hypomobility, Impaired flexibility, Improper body mechanics, Decreased mobility, Decreased strength, Postural dysfunction  Visit Diagnosis: Unsteadiness on feet  Muscle weakness (generalized)  Other disturbances of skin sensation     Problem List Patient Active Problem List   Diagnosis Date Noted   Rhinovirus infection 08/12/2021   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 [place], [place], [address] Phone: [phone]   Fax:  [phone]  Name: Peter Greene MRN: [id number] Date of Birth: 1940/10/14

## 2021-09-17 ENCOUNTER — Encounter: Payer: Self-pay | Admitting: Physical Therapy

## 2021-09-17 ENCOUNTER — Ambulatory Visit: Payer: Medicare Other | Admitting: Physical Therapy

## 2021-09-17 DIAGNOSIS — R2681 Unsteadiness on feet: Secondary | ICD-10-CM

## 2021-09-17 DIAGNOSIS — M6281 Muscle weakness (generalized): Secondary | ICD-10-CM

## 2021-09-17 DIAGNOSIS — R208 Other disturbances of skin sensation: Secondary | ICD-10-CM

## 2021-09-17 NOTE — Therapy (Signed)
[address]. [place], [place], [address] Phone: [phone]   Fax:  [phone]  Physical Therapy Treatment  Patient Details  Name: Peter Greene MRN: [id number] Date of Birth: Nov 30, 1940 Referring Provider (PT): Peter Greene   Encounter Date: 09/17/2021   PT End of Session - 09/17/21 0929     Visit Number 11    Number of Visits 12    Date for PT Re-Evaluation 10/03/21    Authorization Type MCR and [employer]    Authorization Time Period 07/22/21 to 09/02/21    Progress Note Due on Visit 20    PT Start Time 0850    PT Stop Time 0928    PT Time Calculation (min) 38 min    Activity Tolerance Patient tolerated treatment well    Behavior During Therapy Peter Greene for tasks assessed/performed             History reviewed. No pertinent past medical history.  History reviewed. No pertinent surgical history.  There were no vitals filed for this visit.   Subjective Assessment - 09/17/21 0852     Subjective The neuropathy is still here I went to a trial treatment for it last week. I went to a chiropractor called [facility] and I had two treatments and it felt better. My big issue is my quads but I was able to leg press 240# they are strong but they feel tired all the time. My balance has been so so.    Currently in Pain? Yes    Pain Score 2     Pain Location Shoulder    Pain Orientation Left;Right    Pain Descriptors / Indicators Discomfort;Sore    Pain Type Chronic pain                               [facility] Adult PT Treatment/Exercise - 09/17/21 0001       Lumbar Exercises: Aerobic   Recumbent Bike L5 x 6 BLEs only                 Balance Exercises - 09/17/21 0001       Balance Exercises: Standing   Standing Eyes Opened Narrow base of support (BOS);Foam/compliant surface;3 reps;30 secs   head turns   Tandem Stance Eyes open;3 reps;30 secs    Standing, One Foot on a Step Eyes  open;6 inch;3 reps;30 secs    Tandem Gait Forward;4 reps;Intermittent upper extremity support   // bars               PT Education - 09/17/21 [phone]     Education Details long discussion about different types of neuropathies and medical management strategies, also what types of neuropathy we typically expect to get better and which may just "be what they are" depending on the cause and chronicity , HEP updates    Person(s) Educated Patient    Methods Explanation    Comprehension Verbalized understanding              PT Short Term Goals - 09/12/21 0853       PT SHORT TERM GOAL #1   Title Will be compliant with appropriate progressive HEP    Status Achieved      PT SHORT TERM GOAL #2   Title Will be able to complete 5x sit to stand with no UEs in 13 seconds or less    Baseline 9.59  Status Achieved               PT Long Term Goals - 09/12/21 0900       PT LONG TERM GOAL #1   Title MMT to improve by at least one grade in all weak groups    Baseline Patient with good improvement, has increased strength by at least 1/2 m grade.    Time 2    Status On-going      PT LONG TERM GOAL #2   Title Will score 100% on Berg balance test and at least 22/24 on DGI to show improved functional balance skills    Baseline 54/56 BERG, 22/24 on DGI    Time 2    Period Weeks    Status Partially Met      PT LONG TERM GOAL #3   Title Will be compliant with advanced customized gym program prior to DC    Time 2    Status On-going                   Plan - 09/17/21 0929     Clinical Impression Statement Peter Greene arrives today doing well. Per chart and goal review he is really doing well and he tells me he has been able to get to the gym regularly and does a lot of resistance training, proud he has been able to leg press 220#. Still concerned about his neuropathy, we had a long discussion about different types of neuropathies and medical management strategies, also what  types of neuropathy we typically expect to get better and which may just "be what they are" depending on the cause and chronicity. I think he is really close to having maximized benefit from skilled PT services. His main concern is actually balance at this point. Will plan on keeping schedule sessions this week then go on hold- I did recommend f/u with personal trainer at his gym to make sure he is progressing weight and reps safely to avoid injury.    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, hx radiation and chemo tx    Examination-Activity Limitations Locomotion Level;Transfers    Examination-Participation Restrictions Community Activity;Yard Work    Biomedical scientist Low    Rehab Potential Good    PT Frequency Other (comment)   one more visit   PT Duration Other (comment)   one more visit   PT Treatment/Interventions ADLs/Self Care Home Management;Cryotherapy;Moist Heat;Ultrasound;Gait training;Stair training;Functional mobility training;Therapeutic activities;Therapeutic exercise;Balance training;Neuromuscular re-education;Patient/family education;Manual techniques;Passive range of motion;Dry needling;Energy conservation;Taping    PT Next Visit Plan one last visit balance focus then on hold. He's doing all the strength work in the gym on his own already do not need to focus on this here    Peter Greene and Agree with Plan of Care Patient             Patient will benefit from skilled therapeutic intervention in order to improve the following deficits and impairments:  Decreased coordination, Increased fascial restricitons, Decreased activity tolerance, Pain, Decreased balance, Hypomobility, Impaired flexibility, Improper body mechanics, Decreased mobility,  Decreased strength, Postural dysfunction  Visit Diagnosis: Unsteadiness on feet  Muscle weakness (generalized)  Other disturbances of skin sensation     Problem List Patient Active Problem List   Diagnosis Date Noted   Rhinovirus  infection 08/12/2021   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 [place], [place], [address] Phone: [phone]   Fax:  [phone]  Name: Peter Greene MRN: [id number] Date of Birth: 22-Nov-1940

## 2021-09-20 ENCOUNTER — Ambulatory Visit: Payer: Medicare Other | Admitting: Physical Therapy

## 2021-09-20 ENCOUNTER — Encounter: Payer: Self-pay | Admitting: Physical Therapy

## 2021-09-20 DIAGNOSIS — M6281 Muscle weakness (generalized): Secondary | ICD-10-CM

## 2021-09-20 DIAGNOSIS — R2681 Unsteadiness on feet: Secondary | ICD-10-CM

## 2021-09-20 DIAGNOSIS — R208 Other disturbances of skin sensation: Secondary | ICD-10-CM

## 2021-10-10 ENCOUNTER — Other Ambulatory Visit: Payer: Self-pay | Admitting: *Deleted

## 2021-10-10 DIAGNOSIS — Z8679 Personal history of other diseases of the circulatory system: Secondary | ICD-10-CM

## 2021-10-10 DIAGNOSIS — R0989 Other specified symptoms and signs involving the circulatory and respiratory systems: Secondary | ICD-10-CM

## 2021-10-31 NOTE — Progress Notes (Signed)
Office Note     CC: Establish care Requesting Provider:  Burnard Bunting, MD  HPI: Peter [place] is a 81 y.o. (05-14-1940) male presenting at the request of .Burnard Bunting, MD to establish vascular care.  Merel and his wife moved to [place] to be closer to their children.  At 15 years old, he continues to work at [employer].   Vascular surgery history includes EVAR in 2017 for AAA, left carotid angiogram in 2019 for common carotid artery stenosis-no intervention performed.  On exam today, Sanel denied back pain, abdominal pain, chest pain.  He denies claudication, ischemic rest pain, tissue loss.  He is followed at [facility] status post bone marrow transplant for myelodysplastic disease.  Overall he feels to be doing well.  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  Pt currently taking ASA 81 mg, aotrvastatin 40 mg, and eliquis 5 mg BID. 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  No past medical history on file.  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   Social History   Socioeconomic History   Marital status: Married    Spouse name: Not on file   Number of children: Not on file   Years of education: Not on file   Highest education level: Not on file  Occupational History   Not on file  Tobacco Use   Smoking status: Former    Types: Cigarettes    Passive exposure: Never   Smokeless tobacco: Not on file  Substance and Sexual Activity   Alcohol use: Not on file   Drug use: Not on file   Sexual  activity: Not on file  Other Topics Concern   Not on file  Social History Narrative   Not on file   Social Determinants of Health   Financial Resource Strain: Not on file  Food Insecurity: Not on file  Transportation Needs: Not on file  Physical Activity: Not on file  Stress: Not on file  Social Connections: Not on file  Intimate Partner Violence: Not on file  No family history on file.  Current Outpatient Medications  Medication Sig Dispense Refill   acetaminophen (TYLENOL) 500 MG tablet Take 500 mg by mouth every 6 (six) hours as needed for moderate pain.     atorvastatin (LIPITOR) 40 MG tablet Take 40 mg by mouth daily.     azithromycin (ZITHROMAX) 250 MG tablet Take as directed 6 tablet 0   diphenhydramine-acetaminophen (TYLENOL PM) 25-500 MG TABS tablet Take 1 tablet by mouth at bedtime as needed (sleep).     ELIQUIS 5 MG TABS tablet Take 5 mg by mouth 2 (two) times daily.     gabapentin (NEURONTIN) 100 MG capsule Take 100 mg by mouth 3 (three) times daily.     ketotifen (ZADITOR) 0.025 % ophthalmic solution Place 1 drop into the right eye 2 (two) times daily.     levothyroxine (SYNTHROID) 25 MCG tablet Take 25 mcg by mouth daily.     metoprolol tartrate (LOPRESSOR) 25 MG tablet Take 25 mg by mouth 2 (two) times daily.     Multiple Vitamin (MULTIVITAMIN WITH MINERALS) TABS tablet  Take 1 tablet by mouth daily. Centrum     nystatin (MYCOSTATIN) 100000 UNIT/ML suspension Use as directed 5 mLs in the mouth or throat daily.     pantoprazole (PROTONIX) 20 MG tablet Take 20 mg by mouth daily.     predniSONE (DELTASONE) 1 MG tablet Take 1 mg by mouth daily.     Propylene Glycol (SYSTANE BALANCE OP) Place 1 drop into both eyes daily as needed (dry eyes).     tamsulosin (FLOMAX) 0.4 MG CAPS capsule 0.4 mg daily.     valACYclovir (VALTREX) 500 MG tablet Take 500 mg by mouth daily.     No current facility-administered medications for this visit.    No Known Allergies   REVIEW OF  SYSTEMS:  '[X]'$  denotes positive finding, '[ ]'$  denotes negative finding Cardiac  Comments:  Chest pain or chest pressure:    Shortness of breath upon exertion:    Short of breath when lying flat:    Irregular heart rhythm:        Vascular    Pain in calf, thigh, or hip brought on by ambulation:    Pain in feet at night that wakes you up from your sleep:     Blood clot in your veins:    Leg swelling:         Pulmonary    Oxygen at home:    Productive cough:     Wheezing:         Neurologic    Sudden weakness in arms or legs:     Sudden numbness in arms or legs:     Sudden onset of difficulty speaking or slurred speech:    Temporary loss of vision in one eye:     Problems with dizziness:         Gastrointestinal    Blood in stool:     Vomited blood:         Genitourinary    Burning when urinating:     Blood in urine:        Psychiatric    Major depression:         Hematologic    Bleeding problems:    Problems with blood clotting too easily:        Skin    Rashes or ulcers:        Constitutional    Fever or chills:      PHYSICAL EXAMINATION:  There were no vitals filed for this visit.  General:  WDWN in NAD; vital signs documented above Gait: Not observed HENT: WNL, normocephalic Pulmonary: normal non-labored breathing , without wheezing Cardiac: regular HR Abdomen: soft, NT, no masses Skin: without rashes Vascular Exam/Pulses:  Right Left  Radial 2+ (normal) 2+ (normal)  Ulnar 2+ (normal) 2+ (normal)  Femoral    Popliteal    DP 2+ (normal) 2+ (normal)  PT     Extremities: without ischemic changes, without Gangrene , without cellulitis; without open wounds;  Musculoskeletal: no muscle wasting or atrophy  Neurologic: A&O X 3;  No focal weakness or paresthesias are detected Psychiatric:  The pt has Normal affect.   Non-Invasive Vascular Imaging:   Summary:  Right Carotid: Velocities in the right ICA are consistent with a 1-39%  stenosis.   Left  Carotid: Velocities in the left ICA are consistent with a 1-39%  stenosis.                Hemodynamically significant plaque >50% visualized in the  CCA.  Vertebrals:  Bilateral vertebral arteries demonstrate antegrade flow.  Subclavians: Normal flow hemodynamics were seen in bilateral subclavian               arteries.   Abdominal Aorta: Patent endovascular aneurysm repair with no evidence of  endoleak. There is a maximum diameter of 3.26 cm. This is based on limited  visualization due to bowel gas.    ASSESSMENT/PLAN: Tjay Velazquez is a 81 y.o. male presenting with to establish care status post EVAR 2017, known left common carotid artery stenosis.  Imaging was reviewed demonstrating max aortic diameter of 3.26.  I am unable to see imaging from previous outside hospital visit, however at 3.26 cm with no endoleak I am not concerned about size progression.  We will continue to monitor this on a yearly basis.  Velocities in the left common carotid artery have increased from 392 to 465.  At 392 cm/s diagnostic angiogram demonstrated 62% stenosis.  With the increased to 465, I assume that the stenosis has progressed.  There is no data supporting common carotid artery intervention for asymptomatic stenosis, however I am concerned that this could be encroaching upon the bulb and furthermore, the distal ICA and therefore I have ordered a CT angio of the head and neck.  Once completed, I will call Taelor with the results to continue our conversation of medical management versus operative intervention.  At this time, being that he is asymptomatic, we will continue to treat conservatively.    Broadus John, MD [facility] [phone]

## 2021-11-01 ENCOUNTER — Ambulatory Visit (INDEPENDENT_AMBULATORY_CARE_PROVIDER_SITE_OTHER)
Admission: RE | Admit: 2021-11-01 | Discharge: 2021-11-01 | Disposition: A | Discharge status: Home / Self Care | Payer: Medicare Other | Source: Ambulatory Visit | Attending: Vascular Surgery | Admitting: Vascular Surgery

## 2021-11-01 ENCOUNTER — Ambulatory Visit (HOSPITAL_COMMUNITY)
Admission: RE | Admit: 2021-11-01 | Discharge: 2021-11-01 | Disposition: A | Discharge status: Home / Self Care | Payer: Medicare Other | Source: Ambulatory Visit | Attending: Vascular Surgery | Admitting: Vascular Surgery

## 2021-11-01 ENCOUNTER — Encounter: Payer: Self-pay | Admitting: Vascular Surgery

## 2021-11-01 ENCOUNTER — Ambulatory Visit (INDEPENDENT_AMBULATORY_CARE_PROVIDER_SITE_OTHER): Payer: Medicare Other | Admitting: Vascular Surgery

## 2021-11-01 VITALS — BP 96/56 | HR 47 | Temp 97.9°F | Resp 20 | Ht 68.0 in | Wt 198.0 lb

## 2021-11-01 DIAGNOSIS — Z8679 Personal history of other diseases of the circulatory system: Secondary | ICD-10-CM

## 2021-11-01 DIAGNOSIS — I6522 Occlusion and stenosis of left carotid artery: Secondary | ICD-10-CM

## 2021-11-01 DIAGNOSIS — R0989 Other specified symptoms and signs involving the circulatory and respiratory systems: Secondary | ICD-10-CM | POA: Diagnosis present

## 2021-11-01 DIAGNOSIS — Z9889 Other specified postprocedural states: Secondary | ICD-10-CM | POA: Diagnosis not present

## 2021-11-05 ENCOUNTER — Other Ambulatory Visit: Payer: Self-pay

## 2021-11-05 DIAGNOSIS — R0989 Other specified symptoms and signs involving the circulatory and respiratory systems: Secondary | ICD-10-CM

## 2021-11-12 ENCOUNTER — Ambulatory Visit (HOSPITAL_COMMUNITY)
Admission: RE | Admit: 2021-11-12 | Discharge: 2021-11-12 | Disposition: A | Discharge status: Home / Self Care | Payer: Medicare Other | Source: Ambulatory Visit | Attending: Vascular Surgery | Admitting: Vascular Surgery

## 2021-11-12 DIAGNOSIS — R0989 Other specified symptoms and signs involving the circulatory and respiratory systems: Secondary | ICD-10-CM | POA: Diagnosis present

## 2021-11-12 MED ORDER — IOHEXOL 350 MG/ML SOLN
75.0000 mL | Freq: Once | INTRAVENOUS | Status: AC | PRN
  Administered 2021-11-12: 75 mL via INTRAVENOUS

## 2021-11-12 MED ORDER — SODIUM CHLORIDE (PF) 0.9 % IJ SOLN
INTRAMUSCULAR | Status: AC
  Filled 2021-11-12: qty 50

## 2021-11-13 NOTE — Progress Notes (Unsigned)
Office Note     CC: Establish care Requesting Provider:  Burnard Bunting, MD  HPI: Peter Greene is a 81 y.o. (03/02/1941) male presenting at the request of .Burnard Bunting, MD to establish vascular care.  Peter [place] and his wife moved to [place] to be closer to their children.  At 27 years old, he continues to work at [employer].   Vascular surgery history includes EVAR in 2017 for AAA, left carotid angiogram in 2019 for common carotid artery stenosis-no intervention performed.  On exam today, Peter [place] denied back pain, abdominal pain, chest pain.  He denies claudication, ischemic rest pain, tissue loss.  He is followed at [facility] status post bone marrow transplant for myelodysplastic disease.  Overall he feels to be doing well.  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  Pt currently taking ASA 81 mg, aotrvastatin 40 mg, and eliquis 5 mg BID. 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   Social History   Socioeconomic History   Marital status: Married    Spouse name: Not on file   Number of children: Not on file   Years of education: Not on file   Highest education level: Not on file  Occupational History   Not on file  Tobacco Use   Smoking status: Former    Types: Cigarettes    Passive exposure: Never    Smokeless tobacco: Not on file  Substance and Sexual Activity   Alcohol use: Yes   Drug use: Never   Sexual activity: Not on file  Other Topics Concern   Not on file  Social History Narrative   Not on file   Social Determinants of Health   Financial Resource Strain: Not on file  Food Insecurity: Not on file  Transportation Needs: Not on file  Physical Activity: Not on file  Stress: Not on file  Social Connections: Not on file  Intimate Partner Violence: Not on file  No family history on file.  Current Outpatient Medications  Medication Sig Dispense Refill   acetaminophen (TYLENOL) 500 MG tablet Take 500 mg by mouth every 6 (six) hours as needed for moderate pain.     atorvastatin (LIPITOR) 40 MG tablet Take 40 mg by mouth daily.     azithromycin (ZITHROMAX) 250 MG tablet Take as directed 6 tablet 0   diphenhydramine-acetaminophen (TYLENOL PM) 25-500 MG TABS tablet Take 1 tablet by mouth at bedtime as needed (sleep).     ELIQUIS 5 MG TABS tablet Take 5 mg by mouth 2 (two) times daily.     gabapentin (NEURONTIN) 100 MG capsule Take 100 mg by mouth 3 (three) times daily.     ketotifen (ZADITOR) 0.025 % ophthalmic solution Place 1 drop into the right eye 2 (two) times daily.     levothyroxine (SYNTHROID) 25 MCG tablet Take 25 mcg by mouth daily.     metoprolol tartrate (LOPRESSOR) 25 MG  tablet Take 25 mg by mouth 2 (two) times daily.     Multiple Vitamin (MULTIVITAMIN WITH MINERALS) TABS tablet Take 1 tablet by mouth daily. Centrum     nystatin (MYCOSTATIN) 100000 UNIT/ML suspension Use as directed 5 mLs in the mouth or throat daily.     pantoprazole (PROTONIX) 20 MG tablet Take 20 mg by mouth daily.     predniSONE (DELTASONE) 1 MG tablet Take 1 mg by mouth daily.     Propylene Glycol (SYSTANE BALANCE OP) Place 1 drop into both eyes daily as needed (dry eyes).     tamsulosin (FLOMAX) 0.4 MG CAPS capsule 0.4 mg daily.     valACYclovir (VALTREX) 500 MG tablet Take 500 mg by mouth  daily.     No current facility-administered medications for this visit.    No Known Allergies   REVIEW OF SYSTEMS:  '[X]'$  denotes positive finding, '[ ]'$  denotes negative finding Cardiac  Comments:  Chest pain or chest pressure:    Shortness of breath upon exertion:    Short of breath when lying flat:    Irregular heart rhythm:        Vascular    Pain in calf, thigh, or hip brought on by ambulation:    Pain in feet at night that wakes you up from your sleep:     Blood clot in your veins:    Leg swelling:         Pulmonary    Oxygen at home:    Productive cough:     Wheezing:         Neurologic    Sudden weakness in arms or legs:     Sudden numbness in arms or legs:     Sudden onset of difficulty speaking or slurred speech:    Temporary loss of vision in one eye:     Problems with dizziness:         Gastrointestinal    Blood in stool:     Vomited blood:         Genitourinary    Burning when urinating:     Blood in urine:        Psychiatric    Major depression:         Hematologic    Bleeding problems:    Problems with blood clotting too easily:        Skin    Rashes or ulcers:        Constitutional    Fever or chills:      PHYSICAL EXAMINATION:  There were no vitals filed for this visit.  General:  WDWN in NAD; vital signs documented above Gait: Not observed HENT: WNL, normocephalic Pulmonary: normal non-labored breathing , without wheezing Cardiac: regular HR Abdomen: soft, NT, no masses Skin: without rashes Vascular Exam/Pulses:  Right Left  Radial 2+ (normal) 2+ (normal)  Ulnar 2+ (normal) 2+ (normal)  Femoral    Popliteal    DP 2+ (normal) 2+ (normal)  PT     Extremities: without ischemic changes, without Gangrene , without cellulitis; without open wounds;  Musculoskeletal: no muscle wasting or atrophy  Neurologic: A&O X 3;  No focal weakness or paresthesias are detected Psychiatric:  The pt has Normal affect.   Non-Invasive Vascular  Imaging:   Summary:  Right Carotid: Velocities in the right ICA are consistent with a 1-39%  stenosis.   Left Carotid: Velocities in the left ICA are consistent with a 1-39%  stenosis.  Hemodynamically significant plaque >50% visualized in the  CCA.   Vertebrals:  Bilateral vertebral arteries demonstrate antegrade flow.  Subclavians: Normal flow hemodynamics were seen in bilateral subclavian               arteries.   Abdominal Aorta: Patent endovascular aneurysm repair with no evidence of  endoleak. There is a maximum diameter of 3.26 cm. This is based on limited  visualization due to bowel gas.    ASSESSMENT/PLAN: Peter Greene is a 81 y.o. male presenting with to establish care status post EVAR 2017, known left common carotid artery stenosis.  Imaging was reviewed demonstrating max aortic diameter of 3.26.  I am unable to see imaging from previous outside hospital visit, however at 3.26 cm with no endoleak I am not concerned about size progression.  We will continue to monitor this on a yearly basis.  Velocities in the left common carotid artery have increased from 392 to 465.  At 392 cm/s diagnostic angiogram demonstrated 62% stenosis.  With the increased to 465, I assume that the stenosis has progressed.  There is no data supporting common carotid artery intervention for asymptomatic stenosis, however I am concerned that this could be encroaching upon the bulb and furthermore, the distal ICA and therefore I have ordered a CT angio of the head and neck.  Once completed, I will call Peter Greene with the results to continue our conversation of medical management versus operative intervention.  At this time, being that he is asymptomatic, we will continue to treat conservatively.    Peter John, MD [facility] [phone]

## 2021-11-14 ENCOUNTER — Ambulatory Visit (HOSPITAL_COMMUNITY): Admission: RE | Admit: 2021-11-14 | Payer: Medicare Other | Source: Ambulatory Visit

## 2021-11-15 ENCOUNTER — Ambulatory Visit: Payer: Medicare Other | Admitting: Vascular Surgery

## 2021-11-18 ENCOUNTER — Telehealth: Payer: Self-pay

## 2021-11-18 NOTE — Telephone Encounter (Signed)
Pt called stating that he had a phone call visit with Dr Virl Cagey for Friday concerning test results.  Reviewed pt's chart, returned call for clarification, no answer, lf vm to confirm that he had missed a call from Dr Virl Cagey. Staff msg sent to Dr Virl Cagey for advice on how to proceed.

## 2021-11-20 ENCOUNTER — Telehealth: Payer: Self-pay | Admitting: Vascular Surgery

## 2021-11-20 ENCOUNTER — Ambulatory Visit (INDEPENDENT_AMBULATORY_CARE_PROVIDER_SITE_OTHER): Payer: Medicare Other | Admitting: Pulmonary Disease

## 2021-11-20 ENCOUNTER — Encounter: Payer: Self-pay | Admitting: Pulmonary Disease

## 2021-11-20 VITALS — BP 112/68 | HR 55 | Temp 97.9°F | Ht 68.0 in | Wt 199.0 lb

## 2021-11-20 DIAGNOSIS — J849 Interstitial pulmonary disease, unspecified: Secondary | ICD-10-CM | POA: Diagnosis not present

## 2021-11-20 DIAGNOSIS — I6522 Occlusion and stenosis of left carotid artery: Secondary | ICD-10-CM | POA: Diagnosis not present

## 2021-11-20 NOTE — Telephone Encounter (Signed)
-----   Message from Broadus John, MD sent at 11/20/2021  2:14 PM EDT ----- Please schedule appointment with me for next week or the week after

## 2021-11-20 NOTE — Patient Instructions (Addendum)
We will reschedule your PFTs and order high-resolution CT for evaluation of her lung Return to clinic in 2 months for review and plan for next steps

## 2021-11-20 NOTE — Progress Notes (Signed)
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  Used to follow with Dr.Thomas J. 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  He appears to have had surgical lung biopsy in the past but the patient does not recall the date of the procedure.  He is currently not on any specific treatments for interstitial lung disease and is transferring care as he moved [place] to be closer to his family.  He was hospitalized in the middle of May 2023 with sepsis, community-acquired pneumonia with CT scan showing focal consolidations from baseline vesicular.  Years.  Treated with Rocephin, Azithromycin and sent home on Augmentin with a steroid taper.  Overall he feels improved and back to baseline  Pets: No pets Occupation: Retired Music therapist Exposures: No mold exposures.  No mold, hot tub, Jacuzzi.  No feather pillows or comforters ILD questionnaire 08/20/2021-negative Smoking history: 20-pack-year smoker.  Quit in 1984 Travel history: Used to live in [place], [place].  No significant recent travel Relevant family history: No family history of lung disease  Interim history: Reviewed notes from [name] prior pulmonologist at [place].  Did not much details on the note but he is being treated for idiopathic pneumonia with prolonged steroid taper.  There is no mention of biopsy  High-res CT and PFTs were  ordered at last visit but patient has not kept up with appointments and needs to reschedule.  Outpatient Encounter Medications as of 11/20/2021  Medication Sig   acetaminophen (TYLENOL) 500 MG tablet Take 500 mg by mouth every 6 (six) hours as needed for moderate pain.   atorvastatin (LIPITOR) 40 MG tablet Take 40 mg by mouth daily.   azithromycin (ZITHROMAX) 250 MG tablet Take as directed   diphenhydramine-acetaminophen (TYLENOL PM) 25-500 MG TABS tablet Take 1 tablet by mouth at bedtime as needed (sleep).   ELIQUIS 5 MG TABS tablet Take 5 mg by mouth 2 (two) times daily.   gabapentin (NEURONTIN) 100 MG capsule Take 100 mg by mouth 3 (three) times daily.   ketotifen (ZADITOR) 0.025 % ophthalmic solution Place 1 drop into the right eye 2 (two) times daily.   levothyroxine (SYNTHROID) 25 MCG tablet Take 25 mcg by mouth daily.   metoprolol tartrate (LOPRESSOR) 25 MG tablet Take 25 mg by mouth 2 (two) times daily.   Multiple Vitamin (MULTIVITAMIN WITH MINERALS) TABS tablet Take 1 tablet by mouth daily. Centrum   nystatin (MYCOSTATIN) 100000 UNIT/ML suspension Use as directed 5 mLs in the mouth or throat daily.   pantoprazole (PROTONIX) 20 MG tablet Take 20 mg by mouth daily.   predniSONE (DELTASONE) 1 MG tablet Take 1 mg by mouth every other day.   Propylene Glycol (SYSTANE BALANCE OP) Place 1 drop into both eyes daily as needed (dry eyes).   tamsulosin (FLOMAX) 0.4 MG CAPS capsule 0.4 mg daily.   valACYclovir (VALTREX) 500 MG tablet Take 500 mg by mouth daily.   No facility-administered encounter medications on file  as of 11/20/2021.   Physical Exam: Blood pressure 112/68, pulse (!) 55, temperature 97.9 F (36.6 C), temperature source Oral, height '5\' 8"'$  (1.727 m), weight 199 lb (90.3 kg), SpO2 94 %. Gen:      No acute distress HEENT:  EOMI, sclera anicteric Neck:     No masses; no thyromegaly Lungs:    Clear to auscultation bilaterally; normal respiratory effort CV:         Regular rate and  rhythm; no murmurs Abd:      + bowel sounds; soft, non-tender; no palpable masses, no distension Ext:    No edema; adequate peripheral perfusion Skin:      Warm and dry; no rash Neuro: alert and oriented x 3 Psych: normal mood and affect   Data Reviewed: Imaging: CT chest 08/11/2021-multifocal areas of consolidation mostly in the right middle lobe and lower lobes.  Heterogeneous peripheral reticulation.  I have reviewed the images personally.  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  He is currently not on treatment now.  He is also recovering from recent admission for multifocal pneumonia Reschedule high-res CT and PFTs for evaluation as patient missed her last 2 appointments.  Plan/Recommendations: High-res CT, PFTs  Marshell Garfinkel MD [facility] 11/20/2021, 11:24 AM  CC: Burnard Bunting, MD

## 2021-11-22 NOTE — Telephone Encounter (Signed)
Appt has been scheduled.

## 2021-11-27 NOTE — Progress Notes (Signed)
Office Note   Requesting Provider:  Burnard Bunting, MD  HPI: Peter Greene is a 81 y.o. 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   [name] and his wife moved to [place] from [place] to be closer to their children.  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  Vascular surgery history includes EVAR in 2017 for AAA, left carotid angiogram in 2019 for common carotid artery stenosis-no intervention performed. 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  On exam today, Peter Greene denied back pain, abdominal pain, chest pain.  He denies claudication, ischemic rest pain, tissue loss.  Overall he feels to be doing well -denies recent symptoms of TIA, stroke, amaurosis.  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  Pt currently taking ASA 81 mg, aotrvastatin 40 mg, and eliquis 5 mg BID. 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   Social History   Socioeconomic History   Marital status: Married    Spouse name: Not on file   Number of children: Not on file   Years of education: Not on file   Highest education level: Not on file  Occupational  History   Not on file  Tobacco Use   Smoking status: Former    Types: Cigarettes    Passive exposure: Never   Smokeless tobacco: Not on file  Substance and Sexual Activity   Alcohol use: Yes   Drug use: Never   Sexual activity: Not on file  Other Topics Concern   Not on file  Social History Narrative   Not on file   Social Determinants of Health   Financial Resource Strain: Not on file  Food Insecurity: Not on file  Transportation Needs: Not on file  Physical Activity: Not on file  Stress: Not on file  Social Connections: Not on file  Intimate Partner Violence: Not on file  No family history on file.  Current Outpatient Medications  Medication Sig Dispense Refill   acetaminophen (TYLENOL) 500 MG tablet Take 500 mg by mouth every 6 (six) hours as needed for moderate pain.     atorvastatin (LIPITOR) 40 MG tablet Take 40 mg by mouth daily.     azithromycin (ZITHROMAX) 250 MG tablet Take as directed 6 tablet 0   diphenhydramine-acetaminophen (TYLENOL PM) 25-500 MG TABS tablet Take 1 tablet by mouth at bedtime as needed (sleep).     ELIQUIS 5 MG TABS tablet Take 5 mg by mouth 2 (two) times daily.     gabapentin (NEURONTIN) 100 MG capsule Take 100 mg by mouth 3 (three) times daily.     ketotifen (ZADITOR) 0.025 % ophthalmic solution Place 1 drop into the right eye 2 (two) times daily.     levothyroxine (SYNTHROID) 25 MCG  tablet Take 25 mcg by mouth daily.     metoprolol tartrate (LOPRESSOR) 25 MG tablet Take 25 mg by mouth 2 (two) times daily.     Multiple Vitamin (MULTIVITAMIN WITH MINERALS) TABS tablet Take 1 tablet by mouth daily. Centrum     nystatin (MYCOSTATIN) 100000 UNIT/ML suspension Use as directed 5 mLs in the mouth or throat daily.     pantoprazole (PROTONIX) 20 MG tablet Take 20 mg by mouth daily.     predniSONE (DELTASONE) 1 MG tablet Take 1 mg by mouth every other day.     Propylene Glycol (SYSTANE BALANCE OP) Place 1 drop into both eyes daily as needed (dry eyes).      tamsulosin (FLOMAX) 0.4 MG CAPS capsule 0.4 mg daily.     valACYclovir (VALTREX) 500 MG tablet Take 500 mg by mouth daily.     No current facility-administered medications for this visit.    No Known Allergies   REVIEW OF SYSTEMS:  '[X]'$  denotes positive finding, '[ ]'$  denotes negative finding Cardiac  Comments:  Chest pain or chest pressure:    Shortness of breath upon exertion:    Short of breath when lying flat:    Irregular heart rhythm:        Vascular    Pain in calf, thigh, or hip brought on by ambulation:    Pain in feet at night that wakes you up from your sleep:     Blood clot in your veins:    Leg swelling:         Pulmonary    Oxygen at home:    Productive cough:     Wheezing:         Neurologic    Sudden weakness in arms or legs:     Sudden numbness in arms or legs:     Sudden onset of difficulty speaking or slurred speech:    Temporary loss of vision in one eye:     Problems with dizziness:         Gastrointestinal    Blood in stool:     Vomited blood:         Genitourinary    Burning when urinating:     Blood in urine:        Psychiatric    Major depression:         Hematologic    Bleeding problems:    Problems with blood clotting too easily:        Skin    Rashes or ulcers:        Constitutional    Fever or chills:      PHYSICAL EXAMINATION:  There were no vitals filed for this visit.  General:  WDWN in NAD; vital signs documented above Gait: Not observed HENT: WNL, normocephalic Pulmonary: normal non-labored breathing , without wheezing Cardiac: regular HR Abdomen: soft, NT, no masses Skin: without rashes Vascular Exam/Pulses:  Right Left  Radial 2+ (normal) 2+ (normal)  Ulnar 2+ (normal) 2+ (normal)  Femoral    Popliteal    DP 2+ (normal) 2+ (normal)  PT     Extremities: without ischemic changes, without Gangrene , without cellulitis; without open wounds;  Musculoskeletal: no muscle wasting or atrophy  Neurologic: A&O X  3;  No focal weakness or paresthesias are detected Psychiatric:  The pt has Normal affect.   Non-Invasive Vascular Imaging:   Summary:  Right Carotid: Velocities in the right ICA are consistent with a 1-39%  stenosis.  Left Carotid: Velocities in the left ICA are consistent with a 1-39%  stenosis.                Hemodynamically significant plaque >50% visualized in the  CCA.   Vertebrals:  Bilateral vertebral arteries demonstrate antegrade flow.  Subclavians: Normal flow hemodynamics were seen in bilateral subclavian               arteries.   Abdominal Aorta: Patent endovascular aneurysm repair with no evidence of  endoleak. There is a maximum diameter of 3.26 cm. This is based on limited  visualization due to bowel gas.    ASSESSMENT/PLAN: Garnell Phenix is a 81 y.o. male presented to discuss recent CT angio head and neck findings.  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  This does not involve the bulb nor does involve the external and internal carotid arteries.  Fortunately, Langford is asymptomatic at this time.    I had a long conversation with [facility] regarding the above.  Unfortunately, there is very little data related to stroke risk for individuals with focal common carotid artery disease.  All stroke risk is extrapolated from internal carotid artery disease.  I have discussed [name]'s case with my partners, as well as Medical laboratory scientific officer from [place].  There is heterogeneity regarding treatment.  My disposition is that he would benefit from revascularization.  I discussed this with Harrie Jeans who also asked that I reach out to Dr. Rob Bunting in [place].  Similar to internal carotid artery stenosis, I quoted Juanda Crumble a 10% risk of stroke at 5 years.  I asked that he continue on his current medication regimen.  __________________________   In the interim, I was able to reach out to Dr. Rob Bunting.  He stated he would treat the lesion as internal  carotid artery stenosis and would offer surgery to decrease [name]'s future stroke risk. After discussing the risk and benefits of carotid endarterectomy Harrie Jeans and his wife asked to consider their options.  I asked that he call my office should he choose to pursue surgery. I asked that he continue his current medication regimen and call should any questions or concerns arise.  Will follow his AAA s/p EVAR on a yearly basis.    Broadus John, MD [facility] [phone]

## 2021-11-29 ENCOUNTER — Ambulatory Visit (INDEPENDENT_AMBULATORY_CARE_PROVIDER_SITE_OTHER): Payer: Medicare Other | Admitting: Vascular Surgery

## 2021-11-29 ENCOUNTER — Encounter: Payer: Self-pay | Admitting: Vascular Surgery

## 2021-11-29 VITALS — BP 92/61 | HR 79 | Temp 97.3°F | Resp 18 | Ht 68.0 in | Wt 198.0 lb

## 2021-11-29 DIAGNOSIS — I6522 Occlusion and stenosis of left carotid artery: Secondary | ICD-10-CM

## 2021-12-06 ENCOUNTER — Ambulatory Visit: Payer: Medicare Other | Admitting: Vascular Surgery

## 2021-12-06 DIAGNOSIS — I6522 Occlusion and stenosis of left carotid artery: Secondary | ICD-10-CM

## 2021-12-06 NOTE — Progress Notes (Signed)
Patient called earlier in the week.  Please see last clinic visit for plan.

## 2021-12-10 ENCOUNTER — Other Ambulatory Visit: Payer: Self-pay

## 2021-12-10 DIAGNOSIS — I6522 Occlusion and stenosis of left carotid artery: Secondary | ICD-10-CM

## 2022-01-01 ENCOUNTER — Ambulatory Visit (INDEPENDENT_AMBULATORY_CARE_PROVIDER_SITE_OTHER): Payer: Medicare Other | Admitting: Pulmonary Disease

## 2022-01-01 DIAGNOSIS — J849 Interstitial pulmonary disease, unspecified: Secondary | ICD-10-CM | POA: Diagnosis not present

## 2022-01-01 LAB — PULMONARY FUNCTION TEST
DL/VA % pred: 94 %
DL/VA: 3.74 ml/min/mmHg/L
DLCO cor % pred: 63 %
DLCO cor: 14.71 ml/min/mmHg
DLCO unc % pred: 56 %
DLCO unc: 13.02 ml/min/mmHg
FEF 25-75 Post: 3.2 L/sec
FEF 25-75 Pre: 2.65 L/sec
FEF2575-%Change-Post: 20 %
FEF2575-%Pred-Post: 169 %
FEF2575-%Pred-Pre: 139 %
FEV1-%Change-Post: 4 %
FEV1-%Pred-Post: 81 %
FEV1-%Pred-Pre: 77 %
FEV1-Post: 2.19 L
FEV1-Pre: 2.09 L
FEV1FVC-%Change-Post: 1 %
FEV1FVC-%Pred-Pre: 117 %
FEV6-%Change-Post: 2 %
FEV6-%Pred-Post: 71 %
FEV6-%Pred-Pre: 70 %
FEV6-Post: 2.52 L
FEV6-Pre: 2.47 L
FEV6FVC-%Pred-Post: 107 %
FEV6FVC-%Pred-Pre: 107 %
FVC-%Change-Post: 2 %
FVC-%Pred-Post: 67 %
FVC-%Pred-Pre: 65 %
FVC-Post: 2.54 L
FVC-Pre: 2.47 L
Post FEV1/FVC ratio: 86 %
Post FEV6/FVC ratio: 100 %
Pre FEV1/FVC ratio: 85 %
Pre FEV6/FVC Ratio: 100 %
RV % pred: 63 %
RV: 1.59 L
TLC % pred: 62 %
TLC: 4.16 L

## 2022-01-01 NOTE — Patient Instructions (Signed)
Full PFT performed today. °

## 2022-01-01 NOTE — Progress Notes (Signed)
Full PFT performed today. °

## 2022-01-06 ENCOUNTER — Telehealth: Payer: Self-pay | Admitting: Pulmonary Disease

## 2022-01-07 ENCOUNTER — Telehealth: Payer: Self-pay | Admitting: Pulmonary Disease

## 2022-01-07 ENCOUNTER — Encounter: Payer: Self-pay | Admitting: *Deleted

## 2022-01-07 NOTE — Telephone Encounter (Signed)
Letter done and up front for pick up  I called and left detailed msg letting pt know

## 2022-01-07 NOTE — Telephone Encounter (Signed)
Called patient but he did not answer. Left message for him to call us back.  

## 2022-01-07 NOTE — Telephone Encounter (Signed)
Yes.  I am okay with the letter.

## 2022-01-07 NOTE — Telephone Encounter (Signed)
Called and spoke with patient. He wanted to know if [name] would be willing to provide a letter for his oxygen to be sent to [employer]. Per patient, the letter needs to state that the patient recently moved to the area and established with our office. And that he will continue to need to use his [employer] One device.   He would like to come by the office once the letter is complete.   [name], please advise if you are ok with this letter. Thanks!

## 2022-01-07 NOTE — Telephone Encounter (Signed)
Patient is returning phone call. Patient phone number is [phone].

## 2022-01-08 NOTE — Telephone Encounter (Signed)
Letter signed by [name] placed at front desk for patient pick up. Left message on VM (DPR) letting patient know letter is ready for pick up.

## 2022-01-20 ENCOUNTER — Telehealth: Payer: Self-pay | Admitting: Pulmonary Disease

## 2022-01-20 ENCOUNTER — Encounter: Payer: Self-pay | Admitting: Pulmonary Disease

## 2022-01-20 ENCOUNTER — Telehealth (INDEPENDENT_AMBULATORY_CARE_PROVIDER_SITE_OTHER): Payer: Medicare Other | Admitting: Pulmonary Disease

## 2022-01-20 DIAGNOSIS — J849 Interstitial pulmonary disease, unspecified: Secondary | ICD-10-CM | POA: Diagnosis not present

## 2022-01-20 DIAGNOSIS — I6522 Occlusion and stenosis of left carotid artery: Secondary | ICD-10-CM | POA: Diagnosis not present

## 2022-01-20 NOTE — Patient Instructions (Signed)
I am glad you are doing well with your breathing. Your lung function test showed reduction in lung capacity We will order a high-resolution CT scan for better evaluation of the lungs Follow-up in 3 months with video visit or in person visit.

## 2022-01-20 NOTE — Telephone Encounter (Signed)
Spoke with [name] verbally. He is ok with the patient switching to a video visit.   Called and spoke with patient. He verbalized understanding. I attempted to walk him through the process of logging into his MyChart but he was having issues with his password. He agreed to have a link sent to him instead. I advised him to not click on the link until 150pm. He verbalized understanding.   Nothing further needed at time of call.

## 2022-01-20 NOTE — Progress Notes (Signed)
Peter Greene    [id number]    [birth year]  Primary Care Physician:Aronson, Delfino Lovett, MD  Referring Physician: Burnard Bunting, [place],  [place] [address]  Virtual Visit via Video Note  I connected with Peter Greene on 01/20/22 at  2:00 PM EDT by a video enabled telemedicine application and verified that I am speaking with the correct person using two identifiers.  Location: Patient: Home Provider: [address]   I discussed the limitations of evaluation and management by telemedicine and the availability of in person appointments. The patient expressed understanding and agreed to proceed.  Chief complaint: Follow up for interstitial lung disease  HPI: 81 year old with history of laryngeal cancer, atrial fibrillation, myelodysplastic syndrome.  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  Used to follow with Dr.Thomas J. 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  He appears to have had surgical lung biopsy in the past but the patient does not recall the date of the procedure.  He is currently not on any specific treatments for interstitial lung disease and is transferring care as he moved [place] to be closer to his family.  He was hospitalized in the middle of May 2023 with sepsis, community-acquired pneumonia with CT scan showing focal consolidations from baseline vesicular.  Years.  Treated with Rocephin, Azithromycin and sent home on Augmentin with a steroid taper.  Overall he feels improved and back to baseline  Pets: No pets Occupation: Retired Music therapist Exposures: No mold exposures.  No mold, hot tub, Jacuzzi.  No feather pillows or comforters ILD questionnaire  08/20/2021-negative Smoking history: 20-pack-year smoker.  Quit in 1984 Travel history: Used to live in [place], [place].  No significant recent travel Relevant family history: No family history of lung disease  Interim history: Reviewed notes from [name] prior pulmonologist at [place].  Did not have much details on the note but he is being treated for idiopathic pneumonia with prolonged steroid taper.  There is no mention of biopsy  High-res CT and PFTs were ordered but patient has not kept up with appointments and needs to reschedule.  Maintains an exercise regimen at home with stationary cycles and weights.  Outpatient Encounter Medications as of 01/20/2022  Medication Sig   acetaminophen (TYLENOL) 500 MG tablet Take 500 mg by mouth every 6 (six) hours as needed for moderate pain.   atorvastatin (LIPITOR) 40 MG tablet Take 40 mg by mouth daily.   diphenhydramine-acetaminophen (TYLENOL PM) 25-500 MG TABS tablet Take 1 tablet by mouth at bedtime as needed (sleep).   ELIQUIS 5 MG TABS tablet Take 5 mg by mouth 2 (two) times daily.   gabapentin (NEURONTIN) 100 MG capsule Take 100 mg by mouth 3 (three) times daily.   ketotifen (ZADITOR) 0.025 % ophthalmic solution Place 1 drop into the right eye 2 (two) times daily.   levothyroxine (SYNTHROID) 25 MCG tablet Take 25 mcg by mouth daily.   metoprolol tartrate (LOPRESSOR) 25 MG tablet Take 25 mg by mouth 2 (two) times daily.   Multiple Vitamin (MULTIVITAMIN WITH MINERALS) TABS tablet Take 1 tablet by mouth daily. Centrum   nystatin (MYCOSTATIN) 100000 UNIT/ML suspension Use as directed 5 mLs in the mouth or throat daily.   pantoprazole (PROTONIX)  20 MG tablet Take 20 mg by mouth daily.   predniSONE (DELTASONE) 1 MG tablet Take 1 mg by mouth every other day.   Propylene Glycol (SYSTANE BALANCE OP) Place 1 drop into both eyes daily as needed (dry eyes).   tamsulosin (FLOMAX) 0.4 MG CAPS capsule 0.4 mg daily.   valACYclovir (VALTREX) 500 MG  tablet Take 500 mg by mouth daily.   [DISCONTINUED] azithromycin (ZITHROMAX) 250 MG tablet Take as directed   No facility-administered encounter medications on file as of 01/20/2022.   Physical Exam: Video vist  Data Reviewed: Imaging: CT chest 08/11/2021-multifocal areas of consolidation mostly in the right middle lobe and lower lobes.  Heterogeneous peripheral reticulation.  I have reviewed the images personally.  PFTs: 01/01/2022 FVC 2.54 [6 7%], FEV1 2.19 [81%], F/F 86, TLC 4.16 [62%], DLCO 13.02 [56%] Moderate restriction, diffusion defect.  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  He is currently not on treatment now.  He is also recovering from recent admission for multifocal pneumonia PFTs reviewed with restriction and diffusion impairment.  He continues on prednisone 1 mg every other day for now We will reorder the high-res CT as he is missed the last 3 appointments.   Plan/Recommendations: High-resolution CT.   I discussed the assessment and treatment plan with the patient. The patient was provided an opportunity to ask questions and all were answered. The patient agreed with the plan and demonstrated an understanding of the instructions.   The patient was advised to call back or seek an in-person evaluation if the symptoms worsen or if the condition fails to improve as anticipated.  Marshell Garfinkel MD [facility] 01/20/2022, 1:58 PM  CC: Burnard Bunting, MD

## 2022-01-20 NOTE — Addendum Note (Signed)
Addended by: Elton Sin on: 01/20/2022 02:32 PM   Modules accepted: Orders

## 2022-01-22 NOTE — Progress Notes (Signed)
Surgical Instructions    Your procedure is scheduled on Thursday, 01/30/22.  Report to [facility] Main Entrance "A" at 5:30 A.M., then check in with the Admitting office.  Call this number if you have problems the morning of surgery:  [phone]   If you have any questions prior to your surgery date call [phone]: Open Monday-Friday 8am-4pm If you experience any cold or flu symptoms such as cough, fever, chills, shortness of breath, etc. between now and your scheduled surgery, please notify us at the above number     Remember:  Do not eat or drink after midnight the night before your surgery     Take these medicines the morning of surgery with A SIP OF WATER:  atorvastatin (LIPITOR) gabapentin (NEURONTIN) ketotifen (ZADITOR) eye drops levothyroxine (SYNTHROID) metoprolol tartrate (LOPRESSOR) pantoprazole (PROTONIX)  predniSONE (DELTASONE)- if due that day tamsulosin (FLOMAX)   IF NEEDED: acetaminophen (TYLENOL) Propylene Glycol (SYSTANE BALANCE OP)  As of today, STOP taking any (unless otherwise instructed by your surgeon) Aleve, Naproxen, Ibuprofen, Motrin, Advil, Goody's, BC's, all herbal medications, fish oil, and all vitamins.  Stop taking ELIQUIS 2 days prior to surgery. Your last dose will be 01/27/22.          Do not wear jewelry or makeup. Do not wear lotions, powders, cologne or deodorant. Men may shave face and neck. Do not bring valuables to the hospital. Do not wear nail polish, gel polish, artificial nails, or any other type of covering on natural nails (fingers and toes) If you have artificial nails or gel coating that need to be removed by a nail salon, please have this removed prior to surgery. Artificial nails or gel coating may interfere with anesthesia's ability to adequately monitor your vital signs.  [facility] is not responsible for any belongings or valuables.    Do NOT Smoke (Tobacco/Vaping)  24 hours prior to your procedure  If you use a  CPAP at night, you may bring your mask for your overnight stay.   Contacts, glasses, hearing aids, dentures or partials may not be worn into surgery, please bring cases for these belongings   For patients admitted to the hospital, discharge time will be determined by your treatment team.   Patients discharged the day of surgery will not be allowed to drive home, and someone needs to stay with them for 24 hours.   SURGICAL WAITING ROOM VISITATION Patients having surgery or a procedure may have no more than 2 support people in the waiting area - these visitors may rotate.   Children under the age of 62 must have an adult with them who is not the patient. If the patient needs to stay at the hospital during part of their recovery, the visitor guidelines for inpatient rooms apply. Pre-op nurse will coordinate an appropriate time for 1 support person to accompany patient in pre-op.  This support person may not rotate.   Please refer to [email or web address].hu for the visitor guidelines for Inpatients (after your surgery is over and you are in a regular room).    Special instructions:    Oral Hygiene is also important to reduce your risk of infection.  Remember - BRUSH YOUR TEETH THE MORNING OF SURGERY WITH YOUR REGULAR TOOTHPASTE   [facility]- Preparing For Surgery  Before surgery, you can play an important role. Because skin is not sterile, your skin needs to be as free of germs as possible. You can reduce the number of germs on your skin by washing  with CHG (chlorahexidine gluconate) Soap before surgery.  CHG is an antiseptic cleaner which kills germs and bonds with the skin to continue killing germs even after washing.     Please do not use if you have an allergy to CHG or antibacterial soaps. If your skin becomes reddened/irritated stop using the CHG.  Do not shave (including legs and underarms) for at least 48 hours prior to first CHG shower.  It is OK to shave your face.  Please follow these instructions carefully.     Shower the NIGHT BEFORE SURGERY and the MORNING OF SURGERY with CHG Soap.   If you chose to wash your hair, wash your hair first as usual with your normal shampoo. After you shampoo, rinse your hair and body thoroughly to remove the shampoo.  Then [employer] and genitals (private parts) with your normal soap and rinse thoroughly to remove soap.  After that Use CHG Soap as you would any other liquid soap. You can apply CHG directly to the skin and wash gently with a scrungie or a clean washcloth.   Apply the CHG Soap to your body ONLY FROM THE NECK DOWN.  Do not use on open wounds or open sores. Avoid contact with your eyes, ears, mouth and genitals (private parts). Wash Face and genitals (private parts)  with your normal soap.   Wash thoroughly, paying special attention to the area where your surgery will be performed.  Thoroughly rinse your body with warm water from the neck down.  DO NOT shower/wash with your normal soap after using and rinsing off the CHG Soap.  Pat yourself dry with a CLEAN TOWEL.  Wear CLEAN PAJAMAS to bed the night before surgery  Place CLEAN SHEETS on your bed the night before your surgery  DO NOT SLEEP WITH PETS.   Day of Surgery: Take a shower with CHG soap. Wear Clean/Comfortable clothing the morning of surgery Do not apply any deodorants/lotions.   Remember to brush your teeth WITH YOUR REGULAR TOOTHPASTE.    If you received a COVID test during your pre-op visit, it is requested that you wear a mask when out in public, stay away from anyone that may not be feeling well, and notify your surgeon if you develop symptoms. If you have been in contact with anyone that has tested positive in the last 10 days, please notify your surgeon.    Please read over the following fact sheets that you were given.

## 2022-01-23 ENCOUNTER — Encounter (HOSPITAL_COMMUNITY)
Admission: RE | Admit: 2022-01-23 | Discharge: 2022-01-23 | Disposition: A | Discharge status: Home / Self Care | Payer: Medicare Other | Source: Ambulatory Visit | Attending: Vascular Surgery | Admitting: Vascular Surgery

## 2022-01-23 ENCOUNTER — Other Ambulatory Visit (HOSPITAL_COMMUNITY): Payer: Medicare Other

## 2022-01-23 ENCOUNTER — Other Ambulatory Visit: Payer: Self-pay

## 2022-01-23 ENCOUNTER — Encounter (HOSPITAL_COMMUNITY): Payer: Self-pay

## 2022-01-23 VITALS — BP 116/72 | HR 65 | Temp 97.4°F | Resp 18 | Ht 68.0 in | Wt 206.6 lb

## 2022-01-23 DIAGNOSIS — I6782 Cerebral ischemia: Secondary | ICD-10-CM | POA: Insufficient documentation

## 2022-01-23 DIAGNOSIS — I3139 Other pericardial effusion (noninflammatory): Secondary | ICD-10-CM | POA: Diagnosis not present

## 2022-01-23 DIAGNOSIS — Z01818 Encounter for other preprocedural examination: Secondary | ICD-10-CM | POA: Diagnosis present

## 2022-01-23 DIAGNOSIS — I7121 Aneurysm of the ascending aorta, without rupture: Secondary | ICD-10-CM | POA: Diagnosis not present

## 2022-01-23 DIAGNOSIS — F5089 Other specified eating disorder: Secondary | ICD-10-CM | POA: Insufficient documentation

## 2022-01-23 DIAGNOSIS — I6522 Occlusion and stenosis of left carotid artery: Secondary | ICD-10-CM

## 2022-01-23 HISTORY — DX: Bone marrow transplant status: Z94.81

## 2022-01-23 HISTORY — DX: Other complications of anesthesia, initial encounter: T88.59XA

## 2022-01-23 HISTORY — DX: Interstitial pulmonary disease, unspecified: J84.9

## 2022-01-23 HISTORY — DX: Pneumonia, unspecified organism: J18.9

## 2022-01-23 LAB — CBC
HCT: 38 % — ABNORMAL LOW (ref 39.0–52.0)
Hemoglobin: 12.2 g/dL — ABNORMAL LOW (ref 13.0–17.0)
MCH: 32.1 pg (ref 26.0–34.0)
MCHC: 32.1 g/dL (ref 30.0–36.0)
MCV: 100 fL (ref 80.0–100.0)
Platelets: 183 10*3/uL (ref 150–400)
RBC: 3.8 MIL/uL — ABNORMAL LOW (ref 4.22–5.81)
RDW: 17.3 % — ABNORMAL HIGH (ref 11.5–15.5)
WBC: 6.8 10*3/uL (ref 4.0–10.5)
nRBC: 0 % (ref 0.0–0.2)

## 2022-01-23 LAB — COMPREHENSIVE METABOLIC PANEL
ALT: 19 U/L (ref 0–44)
AST: 22 U/L (ref 15–41)
Albumin: 4.1 g/dL (ref 3.5–5.0)
Alkaline Phosphatase: 85 U/L (ref 38–126)
Anion gap: 8 (ref 5–15)
BUN: 21 mg/dL (ref 8–23)
CO2: 27 mmol/L (ref 22–32)
Calcium: 9.5 mg/dL (ref 8.9–10.3)
Chloride: 105 mmol/L (ref 98–111)
Creatinine, Ser: 1.16 mg/dL (ref 0.61–1.24)
GFR, Estimated: >60 mL/min (ref >60)
Glucose, Bld: 95 mg/dL (ref 70–99)
Potassium: 4.2 mmol/L (ref 3.5–5.1)
Sodium: 140 mmol/L (ref 135–145)
Total Bilirubin: 0.6 mg/dL (ref 0.3–1.2)
Total Protein: 7.2 g/dL (ref 6.5–8.1)

## 2022-01-23 LAB — SURGICAL PCR SCREEN
MRSA, PCR: NEGATIVE
Staphylococcus aureus: NEGATIVE

## 2022-01-23 LAB — URINALYSIS, ROUTINE W REFLEX MICROSCOPIC
Bilirubin Urine: NEGATIVE
Glucose, UA: NEGATIVE mg/dL
Hgb urine dipstick: NEGATIVE
Ketones, ur: NEGATIVE mg/dL
Leukocytes,Ua: NEGATIVE
Nitrite: NEGATIVE
Protein, ur: NEGATIVE mg/dL
Specific Gravity, Urine: 1.015 (ref 1.005–1.030)
pH: 5 (ref 5.0–8.0)

## 2022-01-23 LAB — PROTIME-INR
INR: 1.4 — ABNORMAL HIGH (ref 0.8–1.2)
Prothrombin Time: 16.7 seconds — ABNORMAL HIGH (ref 11.4–15.2)

## 2022-01-23 LAB — TYPE AND SCREEN
ABO/RH(D): AB POS
Antibody Screen: NEGATIVE

## 2022-01-23 LAB — APTT: aPTT: 34 seconds (ref 24–36)

## 2022-01-23 NOTE — Progress Notes (Addendum)
PCP - Burnard Bunting, MD Cardiologist - Dr. Lamar Blinks with [facility]  Was living [facility]  PPM/ICD - Denies Device Orders -  Rep Notified -   Chest x-ray - NI EKG - 08/11/21 Stress Test - Yes about 1 1/2 to 2 years ago ECHO - 08/12/21 Cardiac Cath - Doesn't think so  Sleep Study - Denies CPAP -   DM Denies  Blood Thinner Instructions:Eliquis Per surgeon instruction stop 2 days prior to surgery Aspirin Instructions:Per patient instructed to stop aspirin 3 days.   ERAS Protcol -Denies  COVID TEST- NI   Anesthesia review: Yes, has seen cardiology, notes in [facility]  Patient denies shortness of breath, fever, cough and chest pain at PAT appointment   All instructions explained to the patient, with a verbal understanding of the material. Patient agrees to go over the instructions while at home for a better understanding.  The opportunity to ask questions was provided.

## 2022-01-24 NOTE — Anesthesia Preprocedure Evaluation (Addendum)
Anesthesia Evaluation  Patient identified by MRN, date of birth, ID band Patient awake    Reviewed: Allergy & Precautions, NPO status , Patient's Chart, lab work & pertinent test results  Airway Mallampati: II  TM Distance: >3 FB Neck ROM: Full    Dental no notable dental hx.    Pulmonary pneumonia, former smoker   Pulmonary exam normal breath sounds clear to auscultation       Cardiovascular hypertension, Pt. on home beta blockers and Pt. on medications Normal cardiovascular exam Rhythm:Regular Rate:Normal  TTE 08/12/21: 1. Left ventricular ejection fraction, by estimation, is 50 to 55%. The left ventricle has low normal function. The left ventricle has no regional wall motion abnormalities. There is mild concentric left ventricular hypertrophy. Left ventricular diastolic parameters are indeterminate.  2. Right ventricular systolic function is normal. The right ventricular size is normal. There is normal pulmonary artery systolic pressure.  3. Left atrial size was mildly dilated.  4. The pericardial effusion is circumferential.  5. The mitral valve is normal in structure. No evidence of mitral valve regurgitation. No evidence of mitral stenosis.  6. The aortic valve is normal in structure. Aortic valve regurgitation is not visualized. Aortic valve sclerosis is present, with no evidence of aortic valve stenosis.  7. Aneurysm of the ascending aorta, measuring 45 mm. There is moderate dilatation of the aortic root, measuring 40 mm.  8. The inferior vena cava is normal in size with greater than 50% respiratory variability, suggesting right atrial pressure of 3 mmHg.    Neuro/Psych negative neurological ROS     GI/Hepatic negative GI ROS, Neg liver ROS,,,  Endo/Other  Hypothyroidism    Renal/GU negative Renal ROS     Musculoskeletal negative musculoskeletal ROS (+)    Abdominal  (+) + obese  Peds  Hematology  (+)  Blood dyscrasia, anemia   Anesthesia Other Findings   Reproductive/Obstetrics                             Anesthesia Physical Anesthesia Plan  ASA: 3  Anesthesia Plan: General   Post-op Pain Management: Tylenol PO (pre-op)* and Celebrex PO (pre-op)*   Induction: Intravenous  PONV Risk Score and Plan: 2 and Ondansetron, Dexamethasone and Treatment may vary due to age or medical condition  Airway Management Planned: Oral ETT and Video Laryngoscope Planned  Additional Equipment: Arterial line  Intra-op Plan:   Post-operative Plan: Extubation in OR  Informed Consent: I have reviewed the patients History and Physical, chart, labs and discussed the procedure including the risks, benefits and alternatives for the proposed anesthesia with the patient or authorized representative who has indicated his/her understanding and acceptance.     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  Continues to be followed at [facility] for this.  Follows with pulmonologist Dr. 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 He was hospitalized in May 2023 with sepsis, community-acquired pneumonia with CT scan showing focal consolidations as well as heterogeneous areas of peripheral reticulation consistent with interstitial fibrosis.  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  Treated with Rocephin, Azithromycin and  sent home on Augmentin with a steroid taper. When last seen by Dr. Isaiah Serge 01/20/2022 he reported feeling back to baseline.  PFTs 01/01/2022 showed moderate restriction with diffusion impairment.  He is currently  maintained on prednisone 1 mg every other day.  Recently established with cardiology at [facility] after moving to [facility] from [place].  He has a history of atrial fibrillation.  Per note by Dr. Okey Regal 10/28/2021, "Has fair exercise tolerance. He has had atrial fibrillation for 6 to 7 years. He is not symptomatic with atrial fibrillation and hence is not sure as to how many episodes he has. He has been on Eliquis since. No bleeding or stroke. Denies syncope or presyncope. Reports weakness in the muscles of his thighs and hip when he gets up."  Recommended continue Eliquis 5 mg twice daily.  ZIO monitor was ordered to assess A-fib burden, however results not available in Care Everywhere.  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  Patient instructed by surgeon to hold Eliquis 2 days prior to surgery.  Review of prior anesthesia records in care everywhere indicates history of difficult intubation secondary to small oral opening and limited neck range of motion.  Glidescope used.  Preop labs reviewed, mild anemia hemoglobin 12.2, INR mildly elevated 1.4, otherwise unremarkable.  EKG 08/11/2021: Ectopic atrial tachycardia, unifocal. 103. Atrial premature complexes. Borderline prolonged QT interval  PFTs 01/01/2022: FVC 2.54 [6 7%], FEV1 2.19 [81%], F/F 86, TLC 4.16 [62%], DLCO 13.02 [56%]  CTA head neck 11/12/2021: IMPRESSION: 1. No acute intracranial abnormality. Atrophy and chronic microvascular ischemic change in the white matter. 2. Right carotid widely patent in the neck with mild atherosclerotic disease 3. Extensive atherosclerotic disease left common carotid artery with severe stenosis. Left carotid bifurcation is patent. Left internal carotid artery widely patent and likely is partially supplied from the left external carotid artery collaterals. 4. Very small right vertebral artery with ends in PICA.  Left vertebral artery widely patent 5. No intracranial large vessel occlusion 6. These results will be called to the ordering clinician or representative by the Radiologist Assistant, and communication documented in the PACS or [employer].  TTE 08/12/21: 1. Left ventricular ejection fraction, by estimation, is 50 to 55%. The  left ventricle has low normal function. The left ventricle has no regional  wall motion abnormalities. There is mild concentric left ventricular  hypertrophy. Left ventricular  diastolic parameters are indeterminate.  2. Right ventricular systolic function is normal. The right ventricular  size is normal. There is normal pulmonary artery systolic pressure.  3. Left atrial size was mildly dilated.  4. The pericardial effusion is circumferential.  5. The mitral valve is normal in structure. No evidence of mitral valve  regurgitation. No evidence of mitral stenosis.  6. The aortic valve is normal in structure. Aortic valve regurgitation is  not visualized. Aortic valve sclerosis is present, with no evidence of  aortic valve stenosis.  7. Aneurysm of the ascending aorta, measuring 45 mm. There is moderate  dilatation of the aortic root, measuring 40 mm.  8. The inferior vena cava is normal in size with greater than 50%  respiratory variability, suggesting right atrial pressure of 3 mmHg.    )        Anesthesia Quick Evaluation

## 2022-01-24 NOTE — Progress Notes (Signed)
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  Continues to be followed at [facility] for this.  Follows with pulmonologist 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 He was hospitalized in May 2023 with sepsis, community-acquired pneumonia with CT scan showing focal consolidations as well as heterogeneous areas of peripheral reticulation consistent with interstitial fibrosis.  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  Treated with Rocephin, Azithromycin and sent home on Augmentin with a steroid taper.  When last seen by [name] 01/20/2022 he reported feeling back to baseline.  PFTs 01/01/2022 showed moderate restriction with diffusion impairment.  He is currently maintained on prednisone 1 mg every other day.  Recently established with cardiology at [facility] after moving to [facility] [place].  He has a history of atrial fibrillation.  Per note by Dr. Lamar Blinks 10/28/2021, "Has fair exercise tolerance. He has had atrial fibrillation for 6 to 7 years. He is not symptomatic with atrial fibrillation and hence is not sure as to how many episodes he has. He has been on Eliquis since. No bleeding or stroke. Denies syncope or presyncope. Reports weakness in the muscles of his thighs and hip when he gets up."  Recommended continue Eliquis 5 mg twice daily.  ZIO monitor was ordered to assess A-fib burden, however results not available in Care Everywhere.  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  Patient instructed by surgeon to hold Eliquis 2 days prior to surgery.  Review of prior anesthesia records in care everywhere indicates history of difficult intubation secondary to  small oral opening and limited neck range of motion.  Glidescope used.  Preop labs reviewed, mild anemia hemoglobin 12.2, INR mildly elevated 1.4, otherwise unremarkable.  EKG 08/11/2021: Ectopic atrial tachycardia, unifocal. 103. Atrial premature complexes. Borderline prolonged QT interval  PFTs 01/01/2022: FVC 2.54 [6 7%], FEV1 2.19 [81%], F/F 86, TLC 4.16 [62%], DLCO 13.02 [56%]  CTA head neck 11/12/2021: IMPRESSION: 1. No acute intracranial abnormality. Atrophy and chronic microvascular ischemic change in the white matter. 2. Right carotid widely patent in the neck with mild atherosclerotic disease 3. Extensive atherosclerotic disease left common carotid artery with severe stenosis. Left carotid bifurcation is patent. Left internal carotid artery widely patent and likely is partially supplied from the left external carotid artery collaterals. 4. Very small right vertebral artery with ends in PICA. Left vertebral artery widely patent 5. No intracranial large vessel occlusion 6. These results will be called to the ordering clinician or representative by the Radiologist Assistant, and communication documented in the PACS or [employer].  TTE 08/12/21:  1. Left ventricular ejection fraction, by estimation, is 50 to 55%. The  left ventricle has low normal function. The left ventricle has no regional  wall motion abnormalities. There is mild concentric left ventricular  hypertrophy. Left ventricular  diastolic parameters are indeterminate.   2. Right ventricular systolic function is normal. The right ventricular  size is normal. There is normal pulmonary artery systolic pressure.   3. Left atrial size was mildly dilated.   4. The pericardial effusion is circumferential.   5. The mitral valve is normal in structure. No evidence of mitral valve  regurgitation. No evidence of mitral stenosis.  6. The aortic valve is normal in structure. Aortic valve regurgitation is  not  visualized. Aortic valve sclerosis is present, with no evidence of  aortic valve stenosis.   7. Aneurysm of the ascending aorta, measuring 45 mm. There is moderate  dilatation of the aortic root, measuring 40 mm.   8. The inferior vena cava is normal in size with greater than 50%  respiratory variability, suggesting right atrial pressure of 3 mmHg.     Wynonia Musty [facility] Short Stay Center/Anesthesiology Phone [phone] 01/24/2022 1:52 PM

## 2022-01-30 ENCOUNTER — Inpatient Hospital Stay (HOSPITAL_COMMUNITY): Payer: Medicare Other | Admitting: Physician Assistant

## 2022-01-30 ENCOUNTER — Encounter (HOSPITAL_COMMUNITY): Payer: Self-pay | Admitting: Vascular Surgery

## 2022-01-30 ENCOUNTER — Inpatient Hospital Stay (HOSPITAL_COMMUNITY): Payer: Medicare Other | Admitting: Anesthesiology

## 2022-01-30 ENCOUNTER — Encounter (HOSPITAL_COMMUNITY)
Admission: RE | Disposition: A | Discharge status: Home / Self Care | Payer: Self-pay | Source: Ambulatory Visit | Attending: Vascular Surgery

## 2022-01-30 ENCOUNTER — Other Ambulatory Visit: Payer: Self-pay

## 2022-01-30 ENCOUNTER — Inpatient Hospital Stay (HOSPITAL_COMMUNITY)
Admission: RE | Admit: 2022-01-30 | Discharge: 2022-01-31 | DRG: 038 | Disposition: A | Discharge status: Home / Self Care | Payer: Medicare Other | Source: Ambulatory Visit | Attending: Vascular Surgery | Admitting: Vascular Surgery

## 2022-01-30 DIAGNOSIS — Z79890 Hormone replacement therapy (postmenopausal): Secondary | ICD-10-CM | POA: Diagnosis not present

## 2022-01-30 DIAGNOSIS — Z87891 Personal history of nicotine dependence: Secondary | ICD-10-CM | POA: Diagnosis not present

## 2022-01-30 DIAGNOSIS — E039 Hypothyroidism, unspecified: Secondary | ICD-10-CM

## 2022-01-30 DIAGNOSIS — I6522 Occlusion and stenosis of left carotid artery: Secondary | ICD-10-CM

## 2022-01-30 DIAGNOSIS — Z7982 Long term (current) use of aspirin: Secondary | ICD-10-CM | POA: Diagnosis not present

## 2022-01-30 DIAGNOSIS — G629 Polyneuropathy, unspecified: Secondary | ICD-10-CM | POA: Diagnosis present

## 2022-01-30 DIAGNOSIS — Z8521 Personal history of malignant neoplasm of larynx: Secondary | ICD-10-CM | POA: Diagnosis not present

## 2022-01-30 DIAGNOSIS — J849 Interstitial pulmonary disease, unspecified: Secondary | ICD-10-CM | POA: Diagnosis present

## 2022-01-30 DIAGNOSIS — Z79899 Other long term (current) drug therapy: Secondary | ICD-10-CM | POA: Diagnosis not present

## 2022-01-30 DIAGNOSIS — Z8679 Personal history of other diseases of the circulatory system: Secondary | ICD-10-CM | POA: Diagnosis not present

## 2022-01-30 DIAGNOSIS — E785 Hyperlipidemia, unspecified: Secondary | ICD-10-CM | POA: Diagnosis present

## 2022-01-30 DIAGNOSIS — I10 Essential (primary) hypertension: Secondary | ICD-10-CM

## 2022-01-30 DIAGNOSIS — Z7901 Long term (current) use of anticoagulants: Secondary | ICD-10-CM

## 2022-01-30 DIAGNOSIS — I6529 Occlusion and stenosis of unspecified carotid artery: Secondary | ICD-10-CM | POA: Diagnosis present

## 2022-01-30 DIAGNOSIS — Z9481 Bone marrow transplant status: Secondary | ICD-10-CM | POA: Diagnosis not present

## 2022-01-30 DIAGNOSIS — I714 Abdominal aortic aneurysm, without rupture, unspecified: Principal | ICD-10-CM | POA: Diagnosis present

## 2022-01-30 DIAGNOSIS — D638 Anemia in other chronic diseases classified elsewhere: Secondary | ICD-10-CM

## 2022-01-30 HISTORY — PX: ENDARTERECTOMY: SHX5162

## 2022-01-30 HISTORY — PX: PATCH ANGIOPLASTY: SHX6230

## 2022-01-30 LAB — CBC
HCT: 32.5 % — ABNORMAL LOW (ref 39.0–52.0)
Hemoglobin: 10.9 g/dL — ABNORMAL LOW (ref 13.0–17.0)
MCH: 32.3 pg (ref 26.0–34.0)
MCHC: 33.5 g/dL (ref 30.0–36.0)
MCV: 96.4 fL (ref 80.0–100.0)
Platelets: 179 10*3/uL (ref 150–400)
RBC: 3.37 MIL/uL — ABNORMAL LOW (ref 4.22–5.81)
RDW: 16.8 % — ABNORMAL HIGH (ref 11.5–15.5)
WBC: 6.1 10*3/uL (ref 4.0–10.5)
nRBC: 0 % (ref 0.0–0.2)

## 2022-01-30 LAB — CREATININE, SERUM
Creatinine, Ser: 1.22 mg/dL (ref 0.61–1.24)
GFR, Estimated: >60 mL/min (ref >60)

## 2022-01-30 LAB — POCT ACTIVATED CLOTTING TIME
Activated Clotting Time: 245 seconds
Activated Clotting Time: 257 seconds

## 2022-01-30 LAB — ABO/RH: ABO/RH(D): AB POS

## 2022-01-30 SURGERY — ENDARTERECTOMY CAROTID
Anesthesia: General | Site: Neck | Laterality: Left

## 2022-01-30 MED ORDER — PHENYLEPHRINE 80 MCG/ML (10ML) SYRINGE FOR IV PUSH (FOR BLOOD PRESSURE SUPPORT)
PREFILLED_SYRINGE | INTRAVENOUS | Status: DC | PRN
  Administered 2022-01-30: 160 ug via INTRAVENOUS
  Administered 2022-01-30: 80 ug via INTRAVENOUS
  Administered 2022-01-30: 160 ug via INTRAVENOUS
  Administered 2022-01-30: 120 ug via INTRAVENOUS
  Administered 2022-01-30 (×2): 80 ug via INTRAVENOUS

## 2022-01-30 MED ORDER — VALACYCLOVIR HCL 500 MG PO TABS
500.0000 mg | ORAL_TABLET | Freq: Every day | ORAL | Status: DC
  Administered 2022-01-31: 500 mg via ORAL
  Filled 2022-01-30: qty 1

## 2022-01-30 MED ORDER — DOCUSATE SODIUM 100 MG PO CAPS
100.0000 mg | ORAL_CAPSULE | Freq: Every day | ORAL | Status: DC
  Administered 2022-01-31: 100 mg via ORAL
  Filled 2022-01-30: qty 1

## 2022-01-30 MED ORDER — SODIUM CHLORIDE 0.9 % IV SOLN: INTRAVENOUS | Status: DC

## 2022-01-30 MED ORDER — LACTATED RINGERS IV SOLN: INTRAVENOUS | Status: DC | PRN

## 2022-01-30 MED ORDER — METOPROLOL TARTRATE 25 MG PO TABS
25.0000 mg | ORAL_TABLET | Freq: Two times a day (BID) | ORAL | Status: DC
  Administered 2022-01-30 – 2022-01-31 (×2): 25 mg via ORAL
  Filled 2022-01-30 (×2): qty 1

## 2022-01-30 MED ORDER — PROPOFOL 10 MG/ML IV BOLUS
INTRAVENOUS | Status: AC
  Filled 2022-01-30: qty 20

## 2022-01-30 MED ORDER — CHLORHEXIDINE GLUCONATE CLOTH 2 % EX PADS: 6.0000 | MEDICATED_PAD | Freq: Once | CUTANEOUS | Status: DC

## 2022-01-30 MED ORDER — DEXAMETHASONE SODIUM PHOSPHATE 10 MG/ML IJ SOLN
INTRAMUSCULAR | Status: DC | PRN
  Administered 2022-01-30: 10 mg via INTRAVENOUS

## 2022-01-30 MED ORDER — PROTAMINE SULFATE 10 MG/ML IV SOLN
INTRAVENOUS | Status: AC
  Filled 2022-01-30: qty 5

## 2022-01-30 MED ORDER — ACETAMINOPHEN 325 MG PO TABS
325.0000 mg | ORAL_TABLET | ORAL | Status: DC | PRN
  Administered 2022-01-30 – 2022-01-31 (×2): 650 mg via ORAL
  Filled 2022-01-30 (×2): qty 2

## 2022-01-30 MED ORDER — PHENYLEPHRINE HCL-NACL 20-0.9 MG/250ML-% IV SOLN
INTRAVENOUS | Status: DC | PRN
  Administered 2022-01-30: 30 ug/min via INTRAVENOUS

## 2022-01-30 MED ORDER — SUGAMMADEX SODIUM 200 MG/2ML IV SOLN
INTRAVENOUS | Status: DC | PRN
  Administered 2022-01-30 (×2): 100 mg via INTRAVENOUS

## 2022-01-30 MED ORDER — LACTATED RINGERS IV SOLN: INTRAVENOUS | Status: DC

## 2022-01-30 MED ORDER — ORAL CARE MOUTH RINSE: 15.0000 mL | Freq: Once | OROMUCOSAL | Status: AC

## 2022-01-30 MED ORDER — SODIUM CHLORIDE 0.9 % IV SOLN
0.0125 ug/kg/min | INTRAVENOUS | Status: AC
  Administered 2022-01-30: .05 ug/kg/min via INTRAVENOUS
  Filled 2022-01-30: qty 1000

## 2022-01-30 MED ORDER — PHENYLEPHRINE 80 MCG/ML (10ML) SYRINGE FOR IV PUSH (FOR BLOOD PRESSURE SUPPORT)
PREFILLED_SYRINGE | INTRAVENOUS | Status: AC
  Filled 2022-01-30: qty 10

## 2022-01-30 MED ORDER — ROCURONIUM BROMIDE 10 MG/ML (PF) SYRINGE
PREFILLED_SYRINGE | INTRAVENOUS | Status: DC | PRN
  Administered 2022-01-30 (×3): 20 mg via INTRAVENOUS
  Administered 2022-01-30: 50 mg via INTRAVENOUS

## 2022-01-30 MED ORDER — ACETAMINOPHEN 650 MG RE SUPP: 325.0000 mg | RECTAL | Status: DC | PRN

## 2022-01-30 MED ORDER — HEPARIN SODIUM (PORCINE) 1000 UNIT/ML IJ SOLN
INTRAMUSCULAR | Status: AC
  Filled 2022-01-30: qty 10

## 2022-01-30 MED ORDER — LABETALOL HCL 5 MG/ML IV SOLN: 10.0000 mg | INTRAVENOUS | Status: DC | PRN

## 2022-01-30 MED ORDER — HEPARIN SODIUM (PORCINE) 1000 UNIT/ML IJ SOLN
INTRAMUSCULAR | Status: DC | PRN
  Administered 2022-01-30 (×2): 2000 [IU] via INTRAVENOUS
  Administered 2022-01-30: 10000 [IU] via INTRAVENOUS

## 2022-01-30 MED ORDER — LIDOCAINE 2% (20 MG/ML) 5 ML SYRINGE
INTRAMUSCULAR | Status: DC | PRN
  Administered 2022-01-30: 80 mg via INTRAVENOUS

## 2022-01-30 MED ORDER — PREDNISONE 1 MG PO TABS
1.0000 mg | ORAL_TABLET | ORAL | Status: DC
  Administered 2022-01-31: 1 mg via ORAL
  Filled 2022-01-30: qty 1

## 2022-01-30 MED ORDER — PROMETHAZINE HCL 25 MG/ML IJ SOLN: 6.2500 mg | INTRAMUSCULAR | Status: DC | PRN

## 2022-01-30 MED ORDER — PROTAMINE SULFATE 10 MG/ML IV SOLN
INTRAVENOUS | Status: DC | PRN
  Administered 2022-01-30: 50 mg via INTRAVENOUS

## 2022-01-30 MED ORDER — MAGNESIUM SULFATE 2 GM/50ML IV SOLN: 2.0000 g | Freq: Every day | INTRAVENOUS | Status: DC | PRN

## 2022-01-30 MED ORDER — EPHEDRINE 5 MG/ML INJ
INTRAVENOUS | Status: AC
  Filled 2022-01-30: qty 5

## 2022-01-30 MED ORDER — PROPOFOL 10 MG/ML IV BOLUS
INTRAVENOUS | Status: DC | PRN
  Administered 2022-01-30: 90 mg via INTRAVENOUS

## 2022-01-30 MED ORDER — 0.9 % SODIUM CHLORIDE (POUR BTL) OPTIME
TOPICAL | Status: DC | PRN
  Administered 2022-01-30: 1000 mL

## 2022-01-30 MED ORDER — FENTANYL CITRATE (PF) 100 MCG/2ML IJ SOLN: 25.0000 ug | INTRAMUSCULAR | Status: DC | PRN

## 2022-01-30 MED ORDER — HYDRALAZINE HCL 20 MG/ML IJ SOLN: 5.0000 mg | INTRAMUSCULAR | Status: DC | PRN

## 2022-01-30 MED ORDER — DEXAMETHASONE SODIUM PHOSPHATE 10 MG/ML IJ SOLN
INTRAMUSCULAR | Status: AC
  Filled 2022-01-30: qty 1

## 2022-01-30 MED ORDER — PANTOPRAZOLE SODIUM 40 MG PO TBEC
40.0000 mg | DELAYED_RELEASE_TABLET | Freq: Every day | ORAL | Status: DC
  Administered 2022-01-31: 40 mg via ORAL
  Filled 2022-01-30: qty 1

## 2022-01-30 MED ORDER — ATORVASTATIN CALCIUM 40 MG PO TABS
40.0000 mg | ORAL_TABLET | Freq: Every day | ORAL | Status: DC
  Administered 2022-01-31: 40 mg via ORAL
  Filled 2022-01-30: qty 1

## 2022-01-30 MED ORDER — ONDANSETRON HCL 4 MG/2ML IJ SOLN
INTRAMUSCULAR | Status: DC | PRN
  Administered 2022-01-30: 4 mg via INTRAVENOUS

## 2022-01-30 MED ORDER — DEXTRAN 40 IN SALINE 10-0.9 % IV SOLN
INTRAVENOUS | Status: AC | PRN
  Administered 2022-01-30: 500 mL

## 2022-01-30 MED ORDER — CLOPIDOGREL BISULFATE 300 MG PO TABS
300.0000 mg | ORAL_TABLET | Freq: Once | ORAL | Status: AC
  Administered 2022-01-30: 300 mg via ORAL

## 2022-01-30 MED ORDER — ONDANSETRON HCL 4 MG/2ML IJ SOLN: 4.0000 mg | Freq: Four times a day (QID) | INTRAMUSCULAR | Status: DC | PRN

## 2022-01-30 MED ORDER — ASPIRIN 325 MG PO TBEC
DELAYED_RELEASE_TABLET | ORAL | Status: AC
  Filled 2022-01-30: qty 1

## 2022-01-30 MED ORDER — HEMOSTATIC AGENTS (NO CHARGE) OPTIME
TOPICAL | Status: DC | PRN
  Administered 2022-01-30: 2 via TOPICAL
  Administered 2022-01-30: 1 via TOPICAL

## 2022-01-30 MED ORDER — EPHEDRINE SULFATE-NACL 50-0.9 MG/10ML-% IV SOSY
PREFILLED_SYRINGE | INTRAVENOUS | Status: DC | PRN
  Administered 2022-01-30 (×3): 5 mg via INTRAVENOUS
  Administered 2022-01-30: 10 mg via INTRAVENOUS
  Administered 2022-01-30 (×5): 5 mg via INTRAVENOUS

## 2022-01-30 MED ORDER — GABAPENTIN 100 MG PO CAPS
200.0000 mg | ORAL_CAPSULE | Freq: Two times a day (BID) | ORAL | Status: DC
  Administered 2022-01-30 – 2022-01-31 (×2): 200 mg via ORAL
  Filled 2022-01-30 (×2): qty 2

## 2022-01-30 MED ORDER — ASPIRIN 325 MG PO TABS
325.0000 mg | ORAL_TABLET | Freq: Once | ORAL | Status: AC
  Administered 2022-01-30: 325 mg via ORAL
  Filled 2022-01-30: qty 1

## 2022-01-30 MED ORDER — PHENOL 1.4 % MT LIQD: 1.0000 | OROMUCOSAL | Status: DC | PRN

## 2022-01-30 MED ORDER — CLOPIDOGREL BISULFATE 75 MG PO TABS
ORAL_TABLET | ORAL | Status: AC
  Filled 2022-01-30: qty 4

## 2022-01-30 MED ORDER — ONDANSETRON HCL 4 MG/2ML IJ SOLN
INTRAMUSCULAR | Status: AC
  Filled 2022-01-30: qty 2

## 2022-01-30 MED ORDER — FENTANYL CITRATE (PF) 250 MCG/5ML IJ SOLN
INTRAMUSCULAR | Status: AC
  Filled 2022-01-30: qty 5

## 2022-01-30 MED ORDER — SODIUM CHLORIDE 0.9 % IV SOLN: 500.0000 mL | Freq: Once | INTRAVENOUS | Status: DC | PRN

## 2022-01-30 MED ORDER — LEVOTHYROXINE SODIUM 25 MCG PO TABS
25.0000 ug | ORAL_TABLET | Freq: Every day | ORAL | Status: DC
  Administered 2022-01-31: 25 ug via ORAL
  Filled 2022-01-30: qty 1

## 2022-01-30 MED ORDER — HEPARIN 6000 UNIT IRRIGATION SOLUTION
Status: DC | PRN
  Administered 2022-01-30: 1

## 2022-01-30 MED ORDER — POTASSIUM CHLORIDE CRYS ER 20 MEQ PO TBCR: 20.0000 meq | EXTENDED_RELEASE_TABLET | Freq: Every day | ORAL | Status: DC | PRN

## 2022-01-30 MED ORDER — ASPIRIN 81 MG PO TBEC
81.0000 mg | DELAYED_RELEASE_TABLET | Freq: Every day | ORAL | Status: DC
  Administered 2022-01-31: 81 mg via ORAL
  Filled 2022-01-30: qty 1

## 2022-01-30 MED ORDER — ASPIRIN 325 MG PO TABS: 325.0000 mg | ORAL_TABLET | Freq: Once | ORAL | Status: DC

## 2022-01-30 MED ORDER — CELECOXIB 200 MG PO CAPS
200.0000 mg | ORAL_CAPSULE | Freq: Once | ORAL | Status: AC
  Administered 2022-01-30: 200 mg via ORAL
  Filled 2022-01-30: qty 1

## 2022-01-30 MED ORDER — CEFAZOLIN SODIUM-DEXTROSE 2-4 GM/100ML-% IV SOLN
2.0000 g | Freq: Three times a day (TID) | INTRAVENOUS | Status: AC
  Administered 2022-01-30 – 2022-01-31 (×2): 2 g via INTRAVENOUS
  Filled 2022-01-30 (×2): qty 100

## 2022-01-30 MED ORDER — POLYVINYL ALCOHOL 1.4 % OP SOLN
1.0000 [drp] | OPHTHALMIC | Status: DC | PRN
  Administered 2022-01-30: 1 [drp] via OPHTHALMIC
  Filled 2022-01-30: qty 15

## 2022-01-30 MED ORDER — OXYCODONE-ACETAMINOPHEN 5-325 MG PO TABS
1.0000 | ORAL_TABLET | ORAL | Status: DC | PRN
  Administered 2022-01-30 – 2022-01-31 (×2): 1 via ORAL
  Filled 2022-01-30 (×2): qty 1

## 2022-01-30 MED ORDER — FENTANYL CITRATE (PF) 250 MCG/5ML IJ SOLN
INTRAMUSCULAR | Status: DC | PRN
  Administered 2022-01-30: 100 ug via INTRAVENOUS

## 2022-01-30 MED ORDER — CHLORHEXIDINE GLUCONATE 0.12 % MT SOLN
15.0000 mL | Freq: Once | OROMUCOSAL | Status: AC
  Administered 2022-01-30: 15 mL via OROMUCOSAL
  Filled 2022-01-30: qty 15

## 2022-01-30 MED ORDER — LIDOCAINE HCL (PF) 1 % IJ SOLN
INTRAMUSCULAR | Status: AC
  Filled 2022-01-30: qty 30

## 2022-01-30 MED ORDER — HYDROMORPHONE HCL 1 MG/ML IJ SOLN: 0.5000 mg | INTRAMUSCULAR | Status: DC | PRN

## 2022-01-30 MED ORDER — LIDOCAINE-EPINEPHRINE (PF) 1 %-1:200000 IJ SOLN
INTRAMUSCULAR | Status: AC
  Filled 2022-01-30: qty 30

## 2022-01-30 MED ORDER — ROCURONIUM BROMIDE 10 MG/ML (PF) SYRINGE
PREFILLED_SYRINGE | INTRAVENOUS | Status: AC
  Filled 2022-01-30: qty 10

## 2022-01-30 MED ORDER — GUAIFENESIN-DM 100-10 MG/5ML PO SYRP: 15.0000 mL | ORAL_SOLUTION | ORAL | Status: DC | PRN

## 2022-01-30 MED ORDER — HEPARIN SODIUM (PORCINE) 5000 UNIT/ML IJ SOLN
5000.0000 [IU] | Freq: Three times a day (TID) | INTRAMUSCULAR | Status: DC
  Administered 2022-01-31: 5000 [IU] via SUBCUTANEOUS
  Filled 2022-01-30: qty 1

## 2022-01-30 MED ORDER — METOPROLOL TARTRATE 5 MG/5ML IV SOLN: 2.0000 mg | INTRAVENOUS | Status: DC | PRN

## 2022-01-30 MED ORDER — ACETAMINOPHEN 500 MG PO TABS
1000.0000 mg | ORAL_TABLET | Freq: Once | ORAL | Status: AC
  Administered 2022-01-30: 1000 mg via ORAL
  Filled 2022-01-30: qty 2

## 2022-01-30 MED ORDER — CEFAZOLIN SODIUM-DEXTROSE 2-4 GM/100ML-% IV SOLN
2.0000 g | INTRAVENOUS | Status: AC
  Administered 2022-01-30: 2 g via INTRAVENOUS
  Filled 2022-01-30: qty 100

## 2022-01-30 MED ORDER — POLYETHYLENE GLYCOL 3350 17 G PO PACK: 17.0000 g | PACK | Freq: Every day | ORAL | Status: DC | PRN

## 2022-01-30 MED ORDER — TAMSULOSIN HCL 0.4 MG PO CAPS
0.4000 mg | ORAL_CAPSULE | Freq: Every day | ORAL | Status: DC
  Administered 2022-01-31: 0.4 mg via ORAL
  Filled 2022-01-30: qty 1

## 2022-01-30 MED ORDER — HEPARIN 6000 UNIT IRRIGATION SOLUTION
Status: AC
  Filled 2022-01-30: qty 500

## 2022-01-30 MED ORDER — MIRABEGRON ER 25 MG PO TB24
25.0000 mg | ORAL_TABLET | Freq: Every day | ORAL | Status: DC
  Filled 2022-01-30: qty 1

## 2022-01-30 MED ORDER — ALUM & MAG HYDROXIDE-SIMETH 200-200-20 MG/5ML PO SUSP: 15.0000 mL | ORAL | Status: DC | PRN

## 2022-01-30 MED ORDER — LIDOCAINE 2% (20 MG/ML) 5 ML SYRINGE
INTRAMUSCULAR | Status: AC
  Filled 2022-01-30: qty 5

## 2022-01-30 SURGICAL SUPPLY — 61 items
BAG COUNTER SPONGE SURGICOUNT (BAG) ×2
CANISTER SUCT 3000ML PPV (MISCELLANEOUS) ×2
CANNULA VESSEL 3MM 2 BLNT TIP (CANNULA) ×2
CATH ROBINSON RED A/P 18FR (CATHETERS) ×2
CLIP LIGATING EXTRA MED SLVR (CLIP) ×2
CLIP LIGATING EXTRA SM BLUE (MISCELLANEOUS) ×2
COVER PROBE W GEL 5X96 (DRAPES) ×2
COVER TRANSDUCER ULTRASND GEL (DISPOSABLE) ×2
DERMABOND ADVANCED .7 DNX12 (GAUZE/BANDAGES/DRESSINGS) ×2
DRAIN CHANNEL 15F RND FF W/TCR (WOUND CARE)
ELECT REM PT RETURN 9FT ADLT (ELECTROSURGICAL) ×2
EVACUATOR SILICONE 100CC (DRAIN)
GAUZE 4X4 16PLY ~~LOC~~+RFID DBL (SPONGE) ×4
GLOVE BIO SURGEON STRL SZ7.5 (GLOVE) ×2
GLOVE BIOGEL PI IND STRL 8 (GLOVE) ×2
GLOVE SRG 8 PF TXTR STRL LF DI (GLOVE) ×2
GLOVE SURG POLYISO LF SZ8 (GLOVE)
GLOVE SURG SS PI 7.0 STRL IVOR (GLOVE) ×2
GLOVE SURG UNDER POLY LF SZ8 (GLOVE) ×2
GOWN STRL REUS W/ TWL LRG LVL3 (GOWN DISPOSABLE) ×4
GOWN STRL REUS W/TWL 2XL LVL3 (GOWN DISPOSABLE) ×4
GOWN STRL REUS W/TWL LRG LVL3 (GOWN DISPOSABLE) ×4
HEMOSTAT SNOW SURGICEL 2X4 (HEMOSTASIS) ×4
IV ADAPTER SYR DOUBLE MALE LL (MISCELLANEOUS)
KIT BASIN OR (CUSTOM PROCEDURE TRAY) ×2
KIT SHUNT ARGYLE CAROTID ART 6 (VASCULAR PRODUCTS)
KIT TURNOVER KIT B (KITS) ×2
LOOP VESSEL MINI RED (MISCELLANEOUS) ×2
NDL HYPO 25GX1X1/2 BEV (NEEDLE) IMPLANT
NDL SPNL 20GX3.5 QUINCKE YW (NEEDLE) IMPLANT
NEEDLE HYPO 25GX1X1/2 BEV (NEEDLE)
NEEDLE SPNL 20GX3.5 QUINCKE YW (NEEDLE)
NS IRRIG 1000ML POUR BTL (IV SOLUTION) ×6
PACK CAROTID (CUSTOM PROCEDURE TRAY) ×2
PAD ARMBOARD 7.5X6 YLW CONV (MISCELLANEOUS) ×4
PATCH VASC XENOSURE 1CMX6CM (Vascular Products) ×2 IMPLANT
PATCH VASC XENOSURE 1X6 (Vascular Products) ×2 IMPLANT
POSITIONER HEAD DONUT 9IN (MISCELLANEOUS) ×2
SET WALTER ACTIVATION W/DRAPE (SET/KITS/TRAYS/PACK) ×2
SHUNT CAROTID BYPASS 10 (VASCULAR PRODUCTS)
SHUNT CAROTID BYPASS 12FRX15.5 (VASCULAR PRODUCTS)
SPONGE SURGIFOAM ABS GEL 100 (HEMOSTASIS)
STOPCOCK 4 WAY LG BORE MALE ST (IV SETS)
SURGIFLO W/THROMBIN 8M KIT (HEMOSTASIS)
SUT ETHILON 3 0 PS 1 (SUTURE)
SUT MNCRL AB 4-0 PS2 18 (SUTURE) ×2
SUT PROLENE 6 0 BV (SUTURE) ×6
SUT PROLENE 6 0 C 1 30 (SUTURE) ×4
SUT PROLENE BLUE 7 0 (SUTURE) ×2
SUT SILK 3 0 (SUTURE) ×2
SUT SILK 3-0 18XBRD TIE 12 (SUTURE) ×2
SUT VIC AB 2-0 CT1 27 (SUTURE) ×2
SUT VIC AB 2-0 CT1 TAPERPNT 27 (SUTURE) ×2
SUT VIC AB 3-0 SH 27 (SUTURE) ×2
SUT VIC AB 3-0 SH 27X BRD (SUTURE) ×2
SUT VIC AB 4-0 PS2 18 (SUTURE) ×2
SYR 20CC LL (SYRINGE) ×4
SYR CONTROL 10ML LL (SYRINGE)
TOWEL GREEN STERILE (TOWEL DISPOSABLE) ×2
TUBING ART PRESS 48 MALE/FEM (TUBING)
WATER STERILE IRR 1000ML POUR (IV SOLUTION) ×2

## 2022-01-30 NOTE — Progress Notes (Signed)
Dr. Virl Cagey made aware that patient took last dose of Aspirin on 01/27/22 and last dose of Eliquis on 01/28/22. Received a verbal order from Dr. Virl Cagey to administer Aspirin 325 mg and Plavix 300 mg.

## 2022-01-30 NOTE — Progress Notes (Signed)
Postop  Eating dinner, no complaints No deficits Site soft, no hematoma  Doing well overall.   Broadus John MD

## 2022-01-30 NOTE — H&P (Signed)
Hospital H&P    MRN #:  [id number]  History of Present Illness: This is a 81 y.o. 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    Peter Greene and his wife moved to [place] from [place] to be closer to their children.  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   Vascular surgery history includes EVAR in 2017 for AAA, left carotid angiogram in 2019 for common carotid artery stenosis-no intervention performed. 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   Overall he feels to be doing well -denies recent symptoms of TIA, stroke, amaurosis.   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  Pt currently taking ASA 81 mg, aotrvastatin 40 mg, and eliquis 5 mg BID. 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 Authorizing Provider  acetaminophen (TYLENOL) 500 MG tablet Take 500 mg by mouth every 6 (six) hours as needed for moderate pain.   Yes [provider]  aspirin EC 81 MG tablet Take 81 mg by mouth daily. Swallow whole.   Yes [provider]  atorvastatin (LIPITOR) 40 MG  tablet Take 40 mg by mouth daily. 06/28/21  Yes [provider]  ELIQUIS 5 MG TABS tablet Take 5 mg by mouth 2 (two) times daily. 05/28/21  Yes [provider]  gabapentin (NEURONTIN) 100 MG capsule Take 200 mg by mouth 2 (two) times daily. 07/24/21  Yes [provider]  levothyroxine (SYNTHROID) 25 MCG tablet Take 25 mcg by mouth daily. 07/28/21  Yes [provider]  metoprolol tartrate (LOPRESSOR) 25 MG tablet Take 25 mg by mouth 2 (two) times daily. 07/14/21  Yes [provider]  Multiple Vitamin (MULTIVITAMIN WITH MINERALS) TABS tablet Take 1 tablet by mouth daily. Centrum   Yes [provider]  neomycin-polymyxin b-dexamethasone (MAXITROL) 3.5-10000-0.1 SUSP Place 1 drop into both eyes daily.   Yes [provider]  OVER THE COUNTER MEDICATION Take 1 tablet by mouth at bedtime. [employer] Sleep Aid   Yes [provider]  pantoprazole (PROTONIX) 20 MG tablet Take 20 mg by mouth daily. 06/22/21  Yes [provider]  predniSONE (DELTASONE) 1 MG tablet Take 1 mg by mouth every other day. 07/19/21  Yes [provider]  Propylene Glycol (SYSTANE BALANCE OP) Place 1 drop into both eyes daily as needed (dry eyes).   Yes [provider]  tamsulosin (FLOMAX) 0.4 MG CAPS capsule Take 0.4 mg by  mouth daily. 04/13/20  Yes [provider]  valACYclovir (VALTREX) 500 MG tablet Take 500 mg by mouth daily. 09/09/20  Yes [provider]  Vibegron (GEMTESA) 75 MG TABS Take 75 mg by mouth daily.   Yes [provider]    Social History   Socioeconomic History   Marital status: Married    Spouse name: Peter Greene   Number of children: 1   Years of education: Not on file   Highest education level: Not on file  Occupational History   Not on file  Tobacco Use   Smoking status: Former    Types: Cigarettes    Quit date: 1984    Years since quitting: 39.8    Passive exposure: Never   Smokeless  tobacco: Never  Vaping Use   Vaping Use: Never used  Substance and Sexual Activity   Alcohol use: Yes    Comment: occasionally   Drug use: Never   Sexual activity: Not Currently  Other Topics Concern   Not on file  Social History Narrative   Not on file   Social Determinants of Health   Financial Resource Strain: Not on file  Food Insecurity: Not on file  Transportation Needs: Not on file  Physical Activity: Not on file  Stress: Not on file  Social Connections: Not on file  Intimate Partner Violence: Not on file   History reviewed. No pertinent family history.  ROS: Otherwise negative unless mentioned in HPI  Physical Examination  Vitals:   01/30/22 0601  BP: 99/60  Pulse: (!) 53  Resp: 18  Temp: 98.1 F (36.7 C)  SpO2: 93%   Body mass index is 31.41 kg/m.  General:  WDWN in NAD Gait: Not observed HENT: WNL, normocephalic Pulmonary: normal non-labored breathing, without Rales, rhonchi,  wheezing Cardiac: regular Abdomen: soft, NT/ND, no masses Skin: without rashes Vascular Exam/Pulses: 2+ Extremities: without ischemic changes, without Gangrene , without cellulitis; without open wounds;  Musculoskeletal: no muscle wasting or atrophy  Neurologic: A&O X 3;  No focal weakness or paresthesias are detected; speech is fluent/normal Psychiatric:  The pt has Normal affect. Lymph:  Unremarkable  CBC    Component Value Date/Time   WBC 6.8 01/23/2022 1415   RBC 3.80 (L) 01/23/2022 1415   HGB 12.2 (L) 01/23/2022 1415   HCT 38.0 (L) 01/23/2022 1415   PLT 183 01/23/2022 1415   MCV 100.0 01/23/2022 1415   MCH 32.1 01/23/2022 1415   MCHC 32.1 01/23/2022 1415   RDW 17.3 (H) 01/23/2022 1415   LYMPHSABS 1.4 08/14/2021 0443   MONOABS 0.6 08/14/2021 0443   EOSABS 0.0 08/14/2021 0443   BASOSABS 0.0 08/14/2021 0443    BMET    Component Value Date/Time   NA 140 01/23/2022 1415   K 4.2 01/23/2022 1415   CL 105 01/23/2022 1415   CO2 27 01/23/2022 1415   GLUCOSE 95  01/23/2022 1415   BUN 21 01/23/2022 1415   CREATININE 1.16 01/23/2022 1415   CALCIUM 9.5 01/23/2022 1415   GFRNONAA >60 01/23/2022 1415    COAGS: Lab Results  Component Value Date   INR 1.4 (H) 01/23/2022   INR 2.2 (H) 08/12/2021   INR 1.9 (H) 08/11/2021      ASSESSMENT/PLAN: This is a 81 y.o. male presented to discuss recent CT angio head and neck findings.  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  This does not involve the bulb nor does involve the external and internal carotid  arteries.  Fortunately, Minard is asymptomatic at this time.     I had a long conversation with [place] regarding the above.  Unfortunately, there is very little data related to stroke risk for individuals with focal common carotid artery disease.  All stroke risk is extrapolated from internal carotid artery disease.  I have discussed [name]'s case with my partners, as well as Medical laboratory scientific officer from [place].  There is heterogeneity regarding treatment.  My disposition is that he would benefit from revascularization. presented to discuss recent CT angio head and neck findings.  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  This does not involve the bulb nor does involve the external and internal carotid arteries.  Fortunately, [place] is asymptomatic at this time.     I had a long conversation with [place] regarding the above.  Unfortunately, there is very little data related to stroke risk for individuals with focal common carotid artery disease.  All stroke risk is extrapolated from internal carotid artery disease.  I have discussed [name]'s case with my partners, as well as Medical laboratory scientific officer from [place].  There is heterogeneity regarding treatment.  My disposition is that he would benefit from revascularization.  I discussed this with Harrie Jeans who also asked that I reach out to Dr. Rob Bunting in [place]. In the interim, I was  able to reach out to Dr. Rob Bunting.  He stated he would treat the lesion as internal carotid artery stenosis and would offer surgery to decrease [name]'s future stroke risk.  After discussing the risk and benefits of carotid endarterectomy [name] elected to proceed.     Cassandria Santee MD MS [facility] [phone] 01/30/2022  7:43 AM

## 2022-01-30 NOTE — Op Note (Signed)
NAME: Lantz Hermann    MRN: [id number] DOB: [birth year]    DATE OF OPERATION: 01/30/2022  PREOP DIAGNOSIS:    Asymptomatic critical left common carotid stenosis  POSTOP DIAGNOSIS:    Same  PROCEDURE:    Left common carotid endarterectomy  SURGEON: Broadus John  ASSIST: Luisa Dago PA  ANESTHESIA: General   EBL: 74m  INDICATIONS:    CJefry Lesinskiis a 81y.o. male who presents with asymptomatic critical left CCA stenosis.   I had a long conversation with CSaxonburgregarding the above.  Unfortunately, there is very little data related to stroke risk for individuals with focal common carotid artery disease.  All stroke risk is extrapolated from internal carotid artery disease.  I have discussed [name]'s case with my partners, as well as cMedical laboratory scientific officerfrom [place]  There is heterogeneity regarding treatment.  My disposition is that he would benefit from revascularization. I discussed this with CHarrie Jeanswho also [place] I [place] In the interim, I was able to reach out to Dr. BRob Bunting  He stated he would treat the lesion as internal carotid artery stenosis and would offer surgery to decrease [name]'s future stroke risk.   After discussing the risk and benefits of carotid endarterectomy [name] elected to proceed.   FINDINGS:   Severe, left common carotid artery stenosis greater than 95%.  TECHNIQUE:   Patient was brought to the OR laid in supine position.  General anesthesia was induced.  The operating room table was manipulated to the Semi-Fowler position.  Kacen was prepped and draped in standard fashion.  The case began with ultrasound insonation of the left common carotid artery.  Significant disease was appreciated proximal to the carotid bifurcation.  The course of the carotid was marked and a longitudinal incision made on the anterior border of the sternocleidomastoid muscle.  This was taken down to the common carotid artery which  proved challenging due to prior neck radiation.  The common carotid artery stent posterior to the internal jugular vein.  There was an anterior vagus nerve appreciated that was very large in size due to previous radiation.  Special care was taken to ensure the vagus was not under tension.  For exposure of the proximal common carotid artery, the omohyoid muscle was taken down.  Next, the left common carotid artery was exposed.  This was extended distally to the proximal portion of the external carotid artery and internal carotid arteries.    At this point, CRishabhwas given 10,000 units IV heparin to an ACT greater than 250.  An ultrasound was used to ensure the distal endpoint did not extend to the carotid bulb.  It did not.  I elected to clamp immediately proximal to the carotid bulb for endarterectomy.  The common carotid was clamped both proximally and distally and opened via a longitudinal arteriotomy.  This was carried for roughly 8 cm.  Endarterectomy followed.  The common carotid was backbled both proximally and distally to ensure there was no thrombotic burden and the endpoint was sufficient.  Tacking sutures were used at the distal endpoint to ensure there was no dissection flap that would carry.  A bovine patch was brought onto the field and sewn in standard fashion using running 6-0 Prolene suture.  Prior to completion, the common carotid artery was backbled both proximally and distally.  Multiple syringes of heparinized saline were used to ensure there was no thrombotic burden.  Clamps released and  flow to the head restored.  A few repair stitches were needed for the patch.  Doppler ultrasound demonstrated normal signals both proximally and distally including in the external carotid and internal carotid arteries.  A duplex ultrasound was brought in the field demonstrating no flaps under the patch.  At this point, heparin was reversed using protamine.  The wound was irrigated with copious amounts of  saline and hemostasis achieved with thrombin product and artery.  The wound was closed in layers using 2-0 and 3-0 Vicryl suture with 4-0 Monocryl and Dermabond at the level of the skin.  The patient awoke able to follow commands in all 4 extremities.  No deficits appreciated.  Macie Burows, MD [facility] DATE OF DICTATION:   01/30/2022

## 2022-01-30 NOTE — Anesthesia Procedure Notes (Signed)
Arterial Line Insertion Start/End11/05/2021 7:15 AM Performed by: Kyung Rudd, CRNA, CRNA  Patient location: Pre-op. Preanesthetic checklist: patient identified, IV checked, site marked, risks and benefits discussed, surgical consent, monitors and equipment checked, pre-op evaluation, timeout performed and anesthesia consent Lidocaine 1% used for infiltration Right, radial was placed Catheter size: 20 G Hand hygiene performed  and maximum sterile barriers used   Attempts: 1 Procedure performed without using ultrasound guided technique. Following insertion, dressing applied and Biopatch. Post procedure assessment: normal  Patient tolerated the procedure well with no immediate complications.

## 2022-01-30 NOTE — Progress Notes (Signed)
[facility] [place]  Subjective  - Doing well over all very little discomfort   Objective 121/71 64 97.9 F (36.6 C) (Oral) 14 95%  Intake/Output Summary (Last 24 hours) at 01/30/2022 1624 Last data filed at 01/30/2022 1230 Gross per 24 hour  Intake 1750 ml  Output 100 ml  Net 1650 ml    No facial droop or tongue deviation Moving all 4 ext. Mild ecchymosis with minimal incision edema left neck incision.  Negative frank hematoma Lungs non labored breathing  Assessment/Planning: POD #0 s/p left CEA for Asymptomatic critical left common carotid stenosis   Stable post op disposition without hematoma or neurologic deficits   Roxy Horseman 01/30/2022 4:24 PM --  Laboratory Lab Results: Recent Labs    01/30/22 1322  WBC 6.1  HGB 10.9*  HCT 32.5*  PLT 179   BMET Recent Labs    01/30/22 1322  CREATININE 1.22    COAG Lab Results  Component Value Date   INR 1.4 (H) 01/23/2022   INR 2.2 (H) 08/12/2021   INR 1.9 (H) 08/11/2021   No results found for: "PTT"

## 2022-01-30 NOTE — Anesthesia Procedure Notes (Signed)
Procedure Name: Intubation Date/Time: 01/30/2022 8:14 AM  Performed by: Trinna Post., CRNAPre-anesthesia Checklist: Patient identified, Emergency Drugs available, Suction available, Patient being monitored and Timeout performed Patient Re-evaluated:Patient Re-evaluated prior to induction Oxygen Delivery Method: Circle system utilized Preoxygenation: Pre-oxygenation with 100% oxygen Induction Type: IV induction Ventilation: Oral airway inserted - appropriate to patient size and Two handed mask ventilation required Laryngoscope Size: Glidescope and 4 Grade View: Grade I Tube type: Oral Tube size: 7.5 mm Number of attempts: 1 Airway Equipment and Method: Rigid stylet and Video-laryngoscopy Placement Confirmation: ETT inserted through vocal cords under direct vision, positive ETCO2 and breath sounds checked- equal and bilateral Secured at: 22 cm Tube secured with: Tape Dental Injury: Teeth and Oropharynx as per pre-operative assessment  Difficulty Due To: Difficulty was anticipated Comments: Known history of difficult intubation thus glidescope use with no adverse events

## 2022-01-30 NOTE — Transfer of Care (Signed)
Immediate Anesthesia Transfer of Care Note  Patient: Peter Greene  Procedure(s) Performed: ENDARTERECTOMY CAROTID (Left) PATCH ANGIOPLASTY WITH Rueben Bash BIOLOGIC PATCH 1CMX6CM (Left: Neck)  Patient Location: PACU  Anesthesia Type:General  Level of Consciousness: awake, alert , and oriented  Airway & Oxygen Therapy: Patient Spontanous Breathing and Patient connected to face mask oxygen  Post-op Assessment: Report given to RN and Post -op Vital signs reviewed and stable  Post vital signs: Reviewed and stable  Last Vitals:  Vitals Value Taken Time  BP 102/51 01/30/22 1130  Temp 36.6 C 01/30/22 1119  Pulse 80 01/30/22 1134  Resp 13 01/30/22 1134  SpO2 91 % 01/30/22 1134  Vitals shown include unvalidated device data.  Last Pain:  Vitals:   01/30/22 1119  TempSrc:   PainSc: 0-No pain      Patients Stated Pain Goal: 0 (02/14/34 6701)  Complications:  Encounter Notable Events  Notable Event Outcome Phase Comment  Difficult to intubate - expected  Intraprocedure Filed from anesthesia note documentation.

## 2022-01-30 NOTE — Progress Notes (Signed)
Pt arrived to 4e from PACU. Pt oriented to room and staff. CHG bath done. Telemetry box applied and CCMD notified. Vitals obtained. Right radial a-line zeroed. Neuro intact. Pt educated on NPO order until 3pm. Verbalizes understanding. Pt denies needs at this time.

## 2022-01-30 NOTE — Discharge Instructions (Addendum)
   [facility]  Discharge Instructions   Carotid Surgery  Please refer to the following instructions for your post-procedure care. Your surgeon or physician assistant will discuss any changes with you.  Activity  You are encouraged to walk as much as you can. You can slowly return to normal activities but must avoid strenuous activity and heavy lifting until your doctor tell you it's okay. Avoid activities such as vacuuming or swinging a golf club. You can drive after one week if you are comfortable and you are no longer taking prescription pain medications. It is normal to feel tired for serval weeks after your surgery. It is also normal to have difficulty with sleep habits, eating, and bowel movements after surgery. These will go away with time.  Bathing/Showering  Shower daily after you go home. Do not soak in a bathtub, hot tub, or swim until the incision heals completely.  Incision Care  Shower every day. Clean your incision with mild soap and water. Pat the area dry with a clean towel. You do not need a bandage unless otherwise instructed. Do not apply any ointments or creams to your incision. You may have skin glue on your incision. Do not peel it off. It will come off on its own in about one week. Your incision may feel thickened and raised for several weeks after your surgery. This is normal and the skin will soften over time.   For Men Only: It's okay to shave around the incision but do not shave the incision itself for 2 weeks. It is common to have numbness under your chin that could last for several months.  Diet  Resume your normal diet. There are no special food restrictions following this procedure. A low fat/low cholesterol diet is recommended for all patients with vascular disease. In order to heal from your surgery, it is CRITICAL to get adequate nutrition. Your body requires vitamins, minerals, and protein. Vegetables are the best source of  vitamins and minerals. Vegetables also provide the perfect balance of protein. Processed food has little nutritional value, so try to avoid this.  Medications  Resume taking all of your medications unless your doctor or physician assistant tells you not to. If your incision is causing pain, you may take over-the- counter pain relievers such as acetaminophen (Tylenol). If you were prescribed a stronger pain medication, please be aware these medications can cause nausea and constipation. Prevent nausea by taking the medication with a snack or meal. Avoid constipation by drinking plenty of fluids and eating foods with a high amount of fiber, such as fruits, vegetables, and grains.  Do not take Tylenol if you are taking prescription pain medications.  Restart Eliquis on 02/01/2022  Follow Up  Our office will schedule a follow up appointment 2-3 weeks following discharge.  Please call us immediately for any of the following conditions  Increased pain, redness, drainage (pus) from your incision site. Fever of 101 degrees or higher. If you should develop stroke (slurred speech, difficulty swallowing, weakness on one side of your body, loss of vision) you should call 911 and go to the nearest emergency room.  Reduce your risk of vascular disease:  Stop smoking. If you would like help call [facility] at 1-800-QUIT-NOW 778-771-8481) or [place] at [phone]. Manage your cholesterol Maintain a desired weight Control your diabetes Keep your blood pressure down  If you have any questions, please call the office at [phone].

## 2022-01-31 ENCOUNTER — Encounter (HOSPITAL_COMMUNITY): Payer: Self-pay | Admitting: Vascular Surgery

## 2022-01-31 ENCOUNTER — Other Ambulatory Visit (HOSPITAL_COMMUNITY): Payer: Self-pay

## 2022-01-31 LAB — CBC
HCT: 31.8 % — ABNORMAL LOW (ref 39.0–52.0)
Hemoglobin: 10.4 g/dL — ABNORMAL LOW (ref 13.0–17.0)
MCH: 32.4 pg (ref 26.0–34.0)
MCHC: 32.7 g/dL (ref 30.0–36.0)
MCV: 99.1 fL (ref 80.0–100.0)
Platelets: 179 10*3/uL (ref 150–400)
RBC: 3.21 MIL/uL — ABNORMAL LOW (ref 4.22–5.81)
RDW: 16.7 % — ABNORMAL HIGH (ref 11.5–15.5)
WBC: 10.1 10*3/uL (ref 4.0–10.5)
nRBC: 0 % (ref 0.0–0.2)

## 2022-01-31 LAB — BASIC METABOLIC PANEL
Anion gap: 6 (ref 5–15)
BUN: 23 mg/dL (ref 8–23)
CO2: 25 mmol/L (ref 22–32)
Calcium: 8.4 mg/dL — ABNORMAL LOW (ref 8.9–10.3)
Chloride: 107 mmol/L (ref 98–111)
Creatinine, Ser: 1.15 mg/dL (ref 0.61–1.24)
GFR, Estimated: >60 mL/min (ref >60)
Glucose, Bld: 157 mg/dL — ABNORMAL HIGH (ref 70–99)
Potassium: 4.2 mmol/L (ref 3.5–5.1)
Sodium: 138 mmol/L (ref 135–145)

## 2022-01-31 LAB — LIPID PANEL
Cholesterol: 112 mg/dL (ref 0–200)
HDL: 30 mg/dL — ABNORMAL LOW (ref >40)
LDL Cholesterol: 71 mg/dL (ref 0–99)
Total CHOL/HDL Ratio: 3.7 RATIO
Triglycerides: 54 mg/dL (ref <150)
VLDL: 11 mg/dL (ref 0–40)

## 2022-01-31 MED ORDER — HYDROCODONE-ACETAMINOPHEN 5-325 MG PO TABS
1.0000 | ORAL_TABLET | Freq: Four times a day (QID) | ORAL | 0 refills | Status: DC | PRN
  Filled 2022-01-31: qty 8, 2d supply, fill #0

## 2022-01-31 NOTE — Progress Notes (Signed)
Mobility Specialist Progress Note:   01/31/22 0903  Mobility  Activity Ambulated with assistance in hallway  Level of Assistance Standby assist, set-up cues, supervision of patient - no hands on  Assistive Device None  Distance Ambulated (ft) 500 ft  Activity Response Tolerated well  $Mobility charge 1 Mobility   Pt received in bed willing to participate in mobility. No complaints of pain. Left in bed with call bell in reach and all needs met.   [facility] Surveyor, mining Chat only

## 2022-01-31 NOTE — Progress Notes (Signed)
PHARMACIST LIPID MONITORING   Peter Greene is a 81 y.o. male admitted on 01/30/2022 with asymptomatic critical carotid stenosis s/p CEA.  Pharmacy has been consulted to optimize lipid-lowering therapy with the indication of secondary prevention for clinical ASCVD.  Recent Labs:  Lipid Panel (last 6 months):   Lab Results  Component Value Date   CHOL 112 01/31/2022   TRIG 54 01/31/2022   HDL 30 (L) 01/31/2022   CHOLHDL 3.7 01/31/2022   VLDL 11 01/31/2022   LDLCALC 71 01/31/2022    Hepatic function panel (last 6 months):   Lab Results  Component Value Date   AST 22 01/23/2022   ALT 19 01/23/2022   ALKPHOS 85 01/23/2022   BILITOT 0.6 01/23/2022    SCr (since admission):   Serum creatinine: 1.15 mg/dL 01/31/22 0345 Estimated creatinine clearance: 58.8 mL/min  Current therapy and lipid therapy tolerance Current lipid-lowering therapy: atorvastatin 40 Previous lipid-lowering therapies (if applicable):  n/a Documented or reported allergies or intolerances to lipid-lowering therapies (if applicable): none  Assessment:     LDL at goal on high intensity statin.   Plan:    1.Statin intensity (high intensity recommended for all patients regardless of the LDL):  No statin changes. The patient is already on a high intensity statin.  2.Add ezetimibe (if any one of the following):   Not indicated at this time.  3.Refer to lipid clinic:   No  4.Follow-up with:  Primary care provider - Burnard Bunting, MD  5.Follow-up labs after discharge:  No changes in lipid therapy, repeat a lipid panel in one year.      Benetta Spar, PharmD, BCPS, BCCP Clinical Pharmacist  Please check AMION for all [place] phone numbers After 10:00 PM, call [place] [phone]

## 2022-01-31 NOTE — Anesthesia Postprocedure Evaluation (Signed)
Anesthesia Post Note  Patient: [name]  Procedure(s) Performed: ENDARTERECTOMY CAROTID (Left) PATCH ANGIOPLASTY WITH XENOSURE BIOLOGIC PATCH 1CMX6CM (Left: Neck)     Patient location during evaluation: PACU Anesthesia Type: General Level of consciousness: sedated and patient cooperative Pain management: pain level controlled Vital Signs Assessment: post-procedure vital signs reviewed and stable Respiratory status: spontaneous breathing Cardiovascular status: stable Anesthetic complications: yes   Encounter Notable Events  Notable Event Outcome Phase Comment  Difficult to intubate - expected  Intraprocedure Filed from anesthesia note documentation.    Last Vitals:  Vitals:   01/31/22 0233 01/31/22 0344  BP: 127/61 126/63  Pulse: 72 63  Resp: 17 18  Temp:  36.6 C  SpO2: 96% 96%    Last Pain:  Vitals:   01/31/22 0423  TempSrc:   PainSc: Asleep   Pain Goal: Patients Stated Pain Goal: 0 (01/30/22 2120)                 Nolon Nations

## 2022-01-31 NOTE — Progress Notes (Addendum)
  Progress Note    01/31/2022 6:31 AM 1 Day Post-Op  Subjective:  some pain; no trouble swallowing; has voided  Afebrile HR 50's-90's NSR 115'B-262'M systolic 35% 5HR4BU Vitals:   01/31/22 0233 01/31/22 0344  BP: 127/61 126/63  Pulse: 72 63  Resp: 17 18  Temp:  97.8 F (36.6 C)  SpO2: 96% 96%     Physical Exam: Neuro:  in tact; tongue is midline; moving all extremities equally Lungs:  non labored Incision:  clean and dry with small soft hematoma left neck  CBC    Component Value Date/Time   WBC 10.1 01/31/2022 0345   RBC 3.21 (L) 01/31/2022 0345   HGB 10.4 (L) 01/31/2022 0345   HCT 31.8 (L) 01/31/2022 0345   PLT 179 01/31/2022 0345   MCV 99.1 01/31/2022 0345   MCH 32.4 01/31/2022 0345   MCHC 32.7 01/31/2022 0345   RDW 16.7 (H) 01/31/2022 0345   LYMPHSABS 1.4 08/14/2021 0443   MONOABS 0.6 08/14/2021 0443   EOSABS 0.0 08/14/2021 0443   BASOSABS 0.0 08/14/2021 0443    BMET    Component Value Date/Time   NA 138 01/31/2022 0345   K 4.2 01/31/2022 0345   CL 107 01/31/2022 0345   CO2 25 01/31/2022 0345   GLUCOSE 157 (H) 01/31/2022 0345   BUN 23 01/31/2022 0345   CREATININE 1.15 01/31/2022 0345   CALCIUM 8.4 (L) 01/31/2022 0345   GFRNONAA >60 01/31/2022 0345     Intake/Output Summary (Last 24 hours) at 01/31/2022 0631 Last data filed at 01/31/2022 0200 Gross per 24 hour  Intake 3621.13 ml  Output 2100 ml  Net 1521.13 ml     Assessment/Plan:  This is a 81 y.o. male who is s/p left CEA 1 Day Post-Op  -pt is doing well this am. -pt neuro exam is in tact -pt on Eliquis-may need to hold for one more day.  Will defer to Dr. Virl Cagey -pt has not ambulated and needs to walk -pt has voided -f/u with [facility] in 2-3 weeks for wound check. -continue asa/statin   Leontine Locket, PA-C [facility] [phone]  VASCULAR STAFF ADDENDUM: I have independently interviewed and examined the patient. I agree with the above.  No deficits. Wound  healing appropriately. Home today  Cassandria Santee, MD [facility] Phone Number: [phone] 01/31/2022 7:48 AM

## 2022-01-31 NOTE — Discharge Summary (Signed)
Discharge Summary     Peter Greene 11-Sep-1940 81 y.o. male  [id number]  Admission Date: 01/30/2022  Discharge Date: 01/31/2022  Physician: Broadus John, MD  Admission Diagnosis: AAA (abdominal aortic aneurysm) [facility]) [I71.40] Carotid artery stenosis [I65.29] Carotid stenosis, asymptomatic [I65.29]   HPI:   This is a 81 y.o. 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    Peter Greene and his wife moved to [place] from [place] to be closer to their children.  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   Vascular surgery history includes EVAR in 2017 for AAA, left carotid angiogram in 2019 for common carotid artery stenosis-no intervention performed. 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   Overall he feels to be doing well -denies recent symptoms of TIA, stroke, amaurosis.   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  Pt currently taking ASA 81 mg, aotrvastatin 40 mg, and eliquis 5 mg BID. 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  Hospital Course:  The patient was admitted to the hospital and taken to the operating room on 01/30/2022 and underwent left CEA    Findings: Severe, left common carotid artery stenosis greater than 95%   The pt tolerated the procedure well and was transported to the PACU in good condition.   By POD 1, the pt neuro status was in tact.  Pt had been loaded on Plavix the previous day as he had not had asa for several days.  He will not be discharged on this.  He will resume his Eliquis on 02/01/2022.  Pt was able to ambulate and swallow without difficulty.  He was able to void.     Recent Labs    01/31/22 0345  NA 138  K 4.2  CL 107  CO2 25  GLUCOSE 157*  BUN 23  CALCIUM 8.4*    Recent Labs    01/30/22 1322 01/31/22 0345  WBC 6.1 10.1  HGB 10.9* 10.4*  HCT 32.5* 31.8*  PLT 179 179   No results for input(s): "INR" in the last 72 hours.   Discharge Instructions     Discharge patient   Complete by: As directed    Discharge home once pt has walked, voided and been seen by Dr. Virl Cagey.  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   Acute respiratory failure with hypoxia ([place]) 08/12/2021   PNA (pneumonia) 08/11/2021   Sepsis due to pneumonia ([place]) 08/11/2021   Macrocytic anemia 08/11/2021   Hypothyroidism 08/11/2021   HTN (hypertension) 08/11/2021   ILD (interstitial lung disease) ([place]) 08/11/2021   A-fib ([place]) 08/11/2021   Past Medical History:  Diagnosis Date   AAA (abdominal aortic aneurysm) (H[facility])    Carotid artery occlusion    Complication of anesthesia    trouble intubating   History of bone marrow transplant ([place])  Hyperlipidemia    Interstitial lung disease ([place])    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   aspirin EC 81 MG tablet Take 81 mg by mouth daily. Swallow whole.   atorvastatin 40 MG tablet Commonly known as: LIPITOR Take 40 mg by mouth daily.   Eliquis 5 MG Tabs  tablet Generic drug: apixaban Take 5 mg by mouth 2 (two) times daily.   gabapentin 100 MG capsule Commonly known as: NEURONTIN Take 200 mg by mouth 2 (two) times daily.   Gemtesa 75 MG Tabs Generic drug: Vibegron Take 75 mg by mouth daily.   HYDROcodone-acetaminophen 5-325 MG tablet Commonly known as: Norco Take 1 tablet by mouth every 6 (six) hours as needed for moderate pain.   levothyroxine 25 MCG tablet Commonly known as: SYNTHROID Take 25 mcg by mouth daily.   metoprolol tartrate 25 MG tablet Commonly known as: LOPRESSOR Take 25 mg by mouth 2 (two) times daily.   multivitamin with minerals Tabs tablet Take 1 tablet by mouth daily. Centrum   neomycin-polymyxin b-dexamethasone 3.5-10000-0.1 Susp Commonly known as: MAXITROL Place 1 drop into both eyes daily.   OVER THE COUNTER MEDICATION Take 1 tablet by mouth at bedtime. [employer] Sleep Aid   pantoprazole 20 MG tablet Commonly known as: PROTONIX Take 20 mg by mouth daily.   predniSONE 1 MG tablet Commonly known as: DELTASONE Take 1 mg by mouth every other day.   SYSTANE BALANCE OP Place 1 drop into both eyes daily as needed (dry eyes).   tamsulosin 0.4 MG Caps capsule Commonly known as: FLOMAX Take 0.4 mg by mouth daily.   valACYclovir 500 MG tablet Commonly known as: VALTREX Take 500 mg by mouth daily.         [facility] Discharge Instructions Carotid Endarterectomy (CEA)  Please refer to the following instructions for your post-procedure care. Your surgeon or physician assistant will discuss any changes with you.  Activity  You are encouraged to walk as much as you can. You can slowly return to normal activities but must avoid strenuous activity and heavy lifting until your doctor tell you it's OK. Avoid activities such as vacuuming or swinging a golf club. You can drive after one week if you are comfortable and you are no longer taking prescription pain medications.  It is normal to feel tired for serval weeks after your surgery. It is also normal to have difficulty with sleep habits, eating, and bowel movements after surgery. These will go away with time.  Bathing/Showering  You may shower after you come home. Do not soak in a bathtub, hot tub, or swim until the incision heals completely.  Incision Care  Shower every day. Clean your incision with mild soap and water. Pat the area dry with a clean towel. You do not need a bandage unless otherwise instructed. Do not apply any ointments or creams to your incision. You may have skin glue on your incision. Do not peel it off. It will come off on its own in about one week. Your incision may feel thickened and raised for several weeks after your surgery. This is  normal and the skin will soften over time. For Men Only: It's OK to shave around the incision but do not shave the incision itself for 2 weeks. It is common to have numbness under your chin that could last for several months.  Diet  Resume your normal diet. There are no special food restrictions following this procedure. A low fat/low cholesterol diet is recommended for all patients with vascular disease. In order to heal from your surgery, it is CRITICAL to get adequate nutrition. Your body requires vitamins, minerals, and protein. Vegetables are the best source of vitamins and minerals. Vegetables also provide the perfect balance of protein. Processed food has little nutritional value, so try to avoid this.  Medications  Resume taking all of your medications unless your doctor or physician assistant tells you not to.  If your incision is causing pain, you may take over-the- counter pain relievers such as acetaminophen (Tylenol). If you were prescribed a stronger pain medication, please be aware these medications can cause nausea and constipation.  Prevent nausea by taking the medication with a snack or meal. Avoid constipation by drinking plenty of fluids and  eating foods with a high amount of fiber, such as fruits, vegetables, and grains.  Do not take Tylenol if you are taking prescription pain medications.  Restart Eliquis on 02/01/2022  Follow Up  Our office will schedule a follow up appointment 2-3 weeks following discharge.  Please call us immediately for any of the following conditions  Increased pain, redness, drainage (pus) from your incision site. Fever of 101 degrees or higher. If you should develop stroke (slurred speech, difficulty swallowing, weakness on one side of your body, loss of vision) you should call 911 and go to the nearest emergency room.  Reduce your risk of vascular disease:  Stop smoking. If you would like help call [facility] at 1-800-QUIT-NOW 365-098-2688) or [place] at [phone]. Manage your cholesterol Maintain a desired weight Control your diabetes Keep your blood pressure down  If you have any questions, please call the office at [phone].  Prescriptions given: 1.   Roxicet #8 No Refill  Disposition: home  Patient's condition: is Good  Follow up: 1. [facility] in 2-3 weeks    Leontine Locket, PA-C [facility] [phone]   --- For [facility] Registry use ---   Modified Rankin score at D/C (0-6): 0  IV medication needed for:  1. Hypertension: No 2. Hypotension: No  Post-op Complications: No  1. Post-op CVA or TIA: No  If yes: Event classification (right eye, left eye, right cortical, left cortical, verterobasilar, other): n/a  If yes: Timing of event (intra-op, <6 hrs post-op, >=6 hrs post-op, unknown): n/a  2. CN injury: No  If yes: CN n/a injuried   3. Myocardial infarction: No  If yes: Dx by (EKG or clinical, Troponin): n/a  4.  CHF: No  5.  Dysrhythmia (new): No  6. Wound infection: No  7. Reperfusion symptoms: No  8. Return to OR: No  If yes: return to OR for (bleeding, neurologic, other CEA incision, other): n/a  Discharge  medications: Statin use:  Yes ASA use:  Yes   Beta blocker use:  Yes ACE-Inhibitor use:  No  ARB use:  No CCB use: No P2Y12 Antagonist use: No, '[ ]'$  Plavix, '[ ]'$  Plasugrel, '[ ]'$  Ticlopinine, '[ ]'$  Ticagrelor, '[ ]'$  Other, '[ ]'$  No for medical reason, '[ ]'$  Non-compliant, '[ ]'$  Not-indicated Anti-coagulant use:  Yes, '[ ]'$  Warfarin, '[ ]'$   Rivaroxaban, '[ ]'$  Dabigatran,  eliquis

## 2022-01-31 NOTE — Plan of Care (Signed)
  Problem: Education: Goal: Knowledge of General Education information will improve Description: Including pain rating scale, medication(s)/side effects and non-pharmacologic comfort measures Outcome: Progressing   Problem: Health Behavior/Discharge Planning: Goal: Ability to manage health-related needs will improve Outcome: Progressing   Problem: Clinical Measurements: Goal: Ability to maintain clinical measurements within normal limits will improve Outcome: Progressing Goal: Will remain free from infection Outcome: Progressing Goal: Diagnostic test results will improve Outcome: Progressing Goal: Respiratory complications will improve Outcome: Progressing Goal: Cardiovascular complication will be avoided Outcome: Progressing   Problem: Activity: Goal: Risk for activity intolerance will decrease Outcome: Progressing   Problem: Nutrition: Goal: Adequate nutrition will be maintained Outcome: Progressing   Problem: Coping: Goal: Level of anxiety will decrease Outcome: Progressing   Problem: Elimination: Goal: Will not experience complications related to bowel motility Outcome: Progressing Goal: Will not experience complications related to urinary retention Outcome: Progressing   Problem: Pain Managment: Goal: General experience of comfort will improve Outcome: Progressing   Problem: Safety: Goal: Ability to remain free from injury will improve Outcome: Progressing   Problem: Skin Integrity: Goal: Risk for impaired skin integrity will decrease Outcome: Progressing   Problem: Education: Goal: Knowledge of disease or condition will improve Outcome: Progressing Goal: Knowledge of secondary prevention will improve (MUST DOCUMENT ALL) Outcome: Progressing Goal: Knowledge of patient specific risk factors will improve [place]ta [place] N/A or DELETE if not current risk factor) Outcome: Progressing   Problem: Ischemic Stroke/TIA Tissue Perfusion: Goal: Complications of ischemic  stroke/TIA will be minimized Outcome: Progressing   Problem: Coping: Goal: Will verbalize positive feelings about self Outcome: Progressing Goal: Will identify appropriate support needs Outcome: Progressing   Problem: Health Behavior/Discharge Planning: Goal: Ability to manage health-related needs will improve Outcome: Progressing Goal: Goals will be collaboratively established with patient/family Outcome: Progressing   Problem: Self-Care: Goal: Ability to participate in self-care as condition permits will improve Outcome: Progressing Goal: Verbalization of feelings and concerns over difficulty with self-care will improve Outcome: Progressing Goal: Ability to communicate needs accurately will improve Outcome: Progressing   Problem: Nutrition: Goal: Risk of aspiration will decrease Outcome: Progressing Goal: Dietary intake will improve Outcome: Progressing

## 2022-01-31 NOTE — TOC Transition Note (Signed)
Transition of Care (TOC) - CM/SW Discharge Note Marvetta Gibbons RN, BSN Transitions of Care Unit 4E- RN Case Manager See Treatment Team for direct phone #   Patient Details  Name: Peter Greene MRN: [id number] Date of Birth: 08/04/1940  Transition of Care [facility]) CM/SW Contact:  Dawayne Patricia, RN Phone Number: 01/31/2022, 10:29 AM   Clinical Narrative:    Pt stable for transition home today, Transition of Care Department [facility]) has reviewed patient and no TOC needs have been identified at this time.   Final next level of care: Home/Self Care Barriers to Discharge: No Barriers Identified   Patient Goals and CMS Choice    N/A    Discharge Placement           Home            Discharge Plan and Services                                     Social Determinants of Health (SDOH) Interventions     Readmission Risk Interventions    01/31/2022   10:29 AM  Readmission Risk Prevention Plan  Transportation Screening Complete  Home Care Screening Complete  Medication Review (RN CM) Complete

## 2022-01-31 NOTE — Plan of Care (Signed)
Discharge instructions discussed with patient.  Patient instructed on home medications, restrictions, and follow up appointments. Belongings gathered and sent with patient.  Patients medications discussed  Patient discharged via wheelchair by this writer.   

## 2022-02-03 ENCOUNTER — Telehealth: Payer: Self-pay

## 2022-02-03 NOTE — Telephone Encounter (Signed)
Pt called stating that both sides of his jaws were swollen following his carotid endarterectomy.  Reviewed pt's chart, returned call for clarification, two identifiers used. Pt denies any swelling at his mouth, tongue, or throat. He states there is no redness and he has no fever. Pt denies any trouble swallowing or breathing. He does note some neck tightness d/t to swelling, but none of the swelling has worsened since the surgery. Pt instructed to apply some ice to the affected areas, monitor, and call back if any of the above symptoms occur. He states that he has been coughing with some sputum. Instructed him to splint his neck when he coughs to avoid any extra strain. Confirmed understanding.

## 2022-02-14 NOTE — Progress Notes (Deleted)
  POST OPERATIVE OFFICE NOTE    CC:  F/u for surgery  HPI:  This is a 81 y.o. 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  He is now s/p Left CEA on 01/30/22 by Dr. Virl Cagey.    Pt returns today for follow up.  Pt states ***   No Known Allergies  Current Outpatient Medications  Medication Sig Dispense Refill   acetaminophen (TYLENOL) 500 MG tablet Take 500 mg by mouth every 6 (six) hours as needed for moderate pain.     aspirin EC 81 MG tablet Take 81 mg by mouth daily. Swallow whole.     atorvastatin (LIPITOR) 40 MG tablet Take 40 mg by mouth daily.     ELIQUIS 5 MG TABS tablet Take 5 mg by mouth 2 (two) times daily.     gabapentin (NEURONTIN) 100 MG capsule Take 200 mg by mouth 2 (two) times daily.     HYDROcodone-acetaminophen (NORCO) 5-325 MG tablet Take 1 tablet by mouth every 6 (six) hours as needed for moderate pain. 8 tablet 0   levothyroxine (SYNTHROID) 25 MCG tablet Take 25 mcg by mouth daily.     metoprolol tartrate (LOPRESSOR) 25 MG tablet Take 25 mg by mouth 2 (two) times daily.     Multiple Vitamin (MULTIVITAMIN WITH MINERALS) TABS tablet Take 1 tablet by mouth daily. Centrum     neomycin-polymyxin b-dexamethasone (MAXITROL) 3.5-10000-0.1 SUSP Place 1 drop into both eyes daily.     OVER THE COUNTER MEDICATION Take 1 tablet by mouth at bedtime. [employer] Sleep Aid     pantoprazole (PROTONIX) 20 MG tablet Take 20 mg by mouth daily.     predniSONE (DELTASONE) 1 MG tablet Take 1 mg by mouth every other day.     Propylene Glycol (SYSTANE BALANCE OP) Place 1 drop into both eyes daily as needed (dry eyes).     tamsulosin (FLOMAX) 0.4 MG CAPS capsule Take 0.4 mg by mouth daily.     valACYclovir (VALTREX) 500 MG tablet Take 500 mg by mouth daily.     Vibegron (GEMTESA) 75 MG TABS Take 75 mg by mouth daily.     No current facility-administered medications for this visit.     ROS:  See HPI  Physical Exam:  ***  Incision:   *** Extremities:  *** Neuro: *** Abdomen:  ***    Assessment/Plan:  This is a 81 y.o. male who is s/p: ***  -***   Leontine Locket, [facility] [phone]   Clinic MD:  ***

## 2022-02-17 ENCOUNTER — Ambulatory Visit (INDEPENDENT_AMBULATORY_CARE_PROVIDER_SITE_OTHER): Payer: Medicare Other | Admitting: Physician Assistant

## 2022-02-17 ENCOUNTER — Encounter: Payer: Self-pay | Admitting: Physician Assistant

## 2022-02-17 VITALS — BP 106/68 | HR 70 | Temp 97.2°F | Resp 18 | Ht 68.0 in | Wt 200.0 lb

## 2022-02-17 DIAGNOSIS — I6522 Occlusion and stenosis of left carotid artery: Secondary | ICD-10-CM

## 2022-02-17 NOTE — Progress Notes (Unsigned)
  POST OPERATIVE OFFICE NOTE    CC:  F/u for surgery  HPI:  This is a 81 y.o. male who is s/p left common carotid endarterectomy  on 01/30/22 by Dr. Virl Cagey.  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   He denies new stroke/TIA symptoms no amaurosis, aphasia or weakness.     Pt currently taking ASA 81 mg, aotrvastatin 40 mg, and eliquis 5 mg BID. 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     No Known Allergies  Current Outpatient Medications  Medication Sig Dispense Refill   acetaminophen (TYLENOL) 500 MG tablet Take 500 mg by mouth every 6 (six) hours as needed for moderate pain.     aspirin EC 81 MG tablet Take 81 mg by mouth daily. Swallow whole.     atorvastatin (LIPITOR) 40 MG tablet Take 40 mg by mouth daily.     ELIQUIS 5 MG TABS tablet Take 5 mg by mouth 2 (two) times daily.     gabapentin (NEURONTIN) 100 MG capsule Take 200 mg by mouth 2 (two) times daily.     HYDROcodone-acetaminophen (NORCO) 5-325 MG tablet Take 1 tablet by mouth every 6 (six) hours as needed for moderate pain. 8 tablet 0   levothyroxine (SYNTHROID) 25 MCG tablet Take 25 mcg by mouth daily.     metoprolol tartrate (LOPRESSOR) 25 MG tablet Take 25 mg by mouth 2 (two) times daily.     Multiple Vitamin (MULTIVITAMIN WITH MINERALS) TABS tablet Take 1 tablet by mouth daily. Centrum     neomycin-polymyxin b-dexamethasone (MAXITROL) 3.5-10000-0.1 SUSP Place 1 drop into both eyes daily.     OVER THE COUNTER MEDICATION Take 1 tablet by mouth at bedtime. [employer] Sleep Aid     pantoprazole (PROTONIX) 20 MG tablet Take 20 mg by mouth daily.     predniSONE (DELTASONE) 1 MG tablet Take 1 mg by mouth every other day.     Propylene Glycol (SYSTANE BALANCE OP) Place 1 drop into both eyes daily as needed (dry eyes).     tamsulosin (FLOMAX) 0.4 MG CAPS capsule Take 0.4 mg by mouth daily.     valACYclovir (VALTREX) 500 MG tablet Take 500 mg by mouth daily.     Vibegron (GEMTESA) 75 MG TABS Take 75 mg  by mouth daily.     No current facility-administered medications for this visit.     ROS:  See HPI  Physical Exam:    Incision:  Left neck incision healing well Extremities:  moving all 4 extremities Neuro: B UE sensation intact, grip 5/5.  No tongue deviation and no facial droop.     Assessment/Plan:  This is a 81 y.o. male who is s/p:Left CCA endarterectomy for > 80% asymptomatic stenosis. Increase activity as tolerates.  The incision is healing well and he has no neurologic deficits.    -He will f/u for carotid duplex in 3 months.     Roxy Horseman PA-C [facility] [phone]   Call  MD:  Donzetta Matters

## 2022-02-19 ENCOUNTER — Other Ambulatory Visit: Payer: Self-pay

## 2022-02-19 DIAGNOSIS — R0989 Other specified symptoms and signs involving the circulatory and respiratory systems: Secondary | ICD-10-CM

## 2022-02-19 DIAGNOSIS — I6522 Occlusion and stenosis of left carotid artery: Secondary | ICD-10-CM

## 2022-02-25 ENCOUNTER — Other Ambulatory Visit: Payer: Medicare Other

## 2022-02-25 ENCOUNTER — Ambulatory Visit
Admission: RE | Admit: 2022-02-25 | Discharge: 2022-02-25 | Disposition: A | Discharge status: Home / Self Care | Payer: Medicare Other | Source: Ambulatory Visit | Attending: Pulmonary Disease | Admitting: Pulmonary Disease

## 2022-02-25 DIAGNOSIS — J849 Interstitial pulmonary disease, unspecified: Secondary | ICD-10-CM

## 2022-02-26 ENCOUNTER — Telehealth: Payer: Self-pay | Admitting: Pulmonary Disease

## 2022-02-26 DIAGNOSIS — K769 Liver disease, unspecified: Secondary | ICD-10-CM

## 2022-02-26 DIAGNOSIS — R9389 Abnormal findings on diagnostic imaging of other specified body structures: Secondary | ICD-10-CM

## 2022-02-26 NOTE — Telephone Encounter (Signed)
Received call report from [facility] with [facility] on patient's HRCT done on 02/25/22. [name] please review the result/impression copied below:  IMPRESSION: 1. 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 Findings are suggestive of an alternative diagnosis (not UIP) per consensus guidelines: Diagnosis of Idiopathic Pulmonary Fibrosis: An Official ATS/ERS/JRS/ALAT Clinical Practice Guideline. [place], Iss 5, [phone], Nov 29 2016. 2. New irregular solid pulmonary nodule of the right lower lobe measuring 9 mm in mean diameter. Consider one of the following in 3 months for both low-risk and high-risk individuals: (a) repeat chest CT, (b) follow-up PET-CT, or (c) tissue sampling. This recommendation follows the consensus statement: Guidelines for Management of Incidental Pulmonary Nodules Detected on CT Images: From the Fleischner Society 2017; Radiology 2017; 284:228-243. 3. Irregular indeterminate lesion of the inferior right hepatic lobe. Recommend contrast-enhanced liver MRI for further evaluation. 4. Aortic Atherosclerosis (ICD10-I70.0).  Please advise, thank you.

## 2022-03-03 NOTE — Telephone Encounter (Signed)
PET scan order placed for next available. Nothing further at this time.

## 2022-03-03 NOTE — Telephone Encounter (Signed)
Called and discussed with patient. We will get a PET scan for further eval as it will give [place] a look at the liver lesion as well

## 2022-03-19 ENCOUNTER — Encounter (HOSPITAL_COMMUNITY)
Admission: RE | Admit: 2022-03-19 | Discharge: 2022-03-19 | Disposition: A | Discharge status: Home / Self Care | Payer: Medicare Other | Source: Ambulatory Visit | Attending: Pulmonary Disease | Admitting: Pulmonary Disease

## 2022-03-19 DIAGNOSIS — K769 Liver disease, unspecified: Secondary | ICD-10-CM | POA: Insufficient documentation

## 2022-03-19 DIAGNOSIS — K4090 Unilateral inguinal hernia, without obstruction or gangrene, not specified as recurrent: Secondary | ICD-10-CM | POA: Insufficient documentation

## 2022-03-19 DIAGNOSIS — R911 Solitary pulmonary nodule: Secondary | ICD-10-CM | POA: Insufficient documentation

## 2022-03-19 DIAGNOSIS — R9389 Abnormal findings on diagnostic imaging of other specified body structures: Secondary | ICD-10-CM | POA: Diagnosis present

## 2022-03-19 LAB — GLUCOSE, CAPILLARY: Glucose-Capillary: 99 mg/dL (ref 70–99)

## 2022-03-19 MED ORDER — FLUDEOXYGLUCOSE F - 18 (FDG) INJECTION
10.0000 | Freq: Once | INTRAVENOUS | Status: AC
  Administered 2022-03-19: 9.74 via INTRAVENOUS

## 2022-03-20 ENCOUNTER — Ambulatory Visit: Payer: Medicare Other

## 2022-03-26 ENCOUNTER — Telehealth: Payer: Self-pay | Admitting: Pulmonary Disease

## 2022-03-26 NOTE — Telephone Encounter (Signed)
Discussed options of navigation bronchoscopy vs watchful waiting with repeat CT chest in 2-3 months. We discussed risks/benefits of each approach (with bronch <5% risk respiratory failure, 1% risk PTX requiring chest tube, 03/998 risk bleeding). It sounds like he is inclined toward bronch but wishes to discuss with [place] and OSH pulmonologist Dr. Minette Headland.  He requests that we send PET/CT results to Dr. Minette Headland (below). I told him he'd probably need to sign authorization for release of records for [place] to fax report of PET/CT results. If he wants [place] to review images then he needs to ask for a copy of PET/CT on a CD himself from [place] that he could then mail to [employer].    Can we fax PET/CT and HRCT Chest results to physician below once pt signs release?  Dr. Gwen Her [facility]

## 2022-03-26 NOTE — Telephone Encounter (Signed)
Called and spoke with pt. Pt recently had a PET scan performed and is anxious to know the results of this.  With [name] being out of the office, Dr. Verlee Monte, please advise if you are able to call pt to go over the results of this with him?

## 2022-03-27 NOTE — Telephone Encounter (Signed)
Called and spoke to patient and told him when ever he has time to stop by the office and fill out a records release form for Dr Marcello Moores. And we can fax the CT and PET scan results to him. Nothing further needed

## 2022-03-31 DIAGNOSIS — E041 Nontoxic single thyroid nodule: Secondary | ICD-10-CM

## 2022-03-31 HISTORY — PX: MOHS SURGERY: SUR867

## 2022-03-31 HISTORY — DX: Nontoxic single thyroid nodule: E04.1

## 2022-04-01 ENCOUNTER — Telehealth: Payer: Self-pay | Admitting: Pulmonary Disease

## 2022-04-01 ENCOUNTER — Encounter: Payer: Self-pay | Admitting: Student

## 2022-04-01 DIAGNOSIS — R911 Solitary pulmonary nodule: Secondary | ICD-10-CM

## 2022-04-01 NOTE — Telephone Encounter (Signed)
Order placed for navigation bronchoscopy. Needs to be off eliquis for 3 days and aspirin for 5 days before procedure.  [place]

## 2022-04-01 NOTE — Telephone Encounter (Signed)
I called and discussed with patient.  He is agreeable to proceed with navigational bronchoscopy and biopsy by Dr. Verlee Monte He will need to hold Eliquis for 5 days.  Will coordinate with team to get him on the schedule.

## 2022-04-01 NOTE — Telephone Encounter (Signed)
Called patient and he would like to go over PET scan results with Dr Vaughan Browner. PET scan was done 12/20.   He states he wants to get Dr Vaughan Browner to look at results from PET scan before he makes a follow up appointment.   Please advise sir

## 2022-04-01 NOTE — Telephone Encounter (Signed)
Pt has been scheduled and I have spoken to him and given him appt info.  Letter sent thru [place] [facility] & mailed.  Documentation is in referral.  Nothing further needed.

## 2022-04-14 ENCOUNTER — Other Ambulatory Visit: Payer: Self-pay | Admitting: *Deleted

## 2022-04-14 DIAGNOSIS — Z01818 Encounter for other preprocedural examination: Secondary | ICD-10-CM

## 2022-04-16 ENCOUNTER — Encounter (HOSPITAL_COMMUNITY): Payer: Self-pay | Admitting: Student

## 2022-04-16 ENCOUNTER — Other Ambulatory Visit: Payer: Self-pay

## 2022-04-16 LAB — NOVEL CORONAVIRUS, NAA: SARS-CoV-2, NAA: NOT DETECTED

## 2022-04-16 LAB — SPECIMEN STATUS REPORT

## 2022-04-16 NOTE — Progress Notes (Signed)
PCP - Burnard Bunting, MD Cardiologist - Dr. Lamar Blinks [facility])  PPM/ICD - denies  Chest x-ray - 08/13/21 EKG - 08/11/21 ECHO - 08/12/21 Cardiac Cath - 01/25/18  CPAP - n/a  Fasting Blood Sugar - n/a  Blood Thinner Instructions: Eliquis - last dose - 04/13/22 Aspirin Instructions - last dose - 04/11/22 Patient was instructed: As of today, STOP taking any Aspirin (unless otherwise instructed by your surgeon) Aleve, Naproxen, Ibuprofen, Motrin, Advil, Goody's, BC's, all herbal medications, fish oil, and all vitamins.  ERAS Protcol - n/a  COVID TEST- test negative on 04/14/22  Anesthesia review: yes - cardiac history  Patient verbally denies any shortness of breath, fever, cough and chest pain during phone call   -------------  SDW INSTRUCTIONS given:  Your procedure is scheduled on Thursday, January 18th, 2024.  Report to [facility] Main Entrance "A" at 07:45 A.M., and check in at the Admitting office.  Call this number if you have problems the morning of surgery:  [phone]   Remember:  Do not eat or drink after midnight the night before your surgery    Take these medicines the morning of surgery with A SIP OF WATER: Lipitor, Gabapentin, Metoprolol, Prednisone, Flomax, Valtrex PRN: Tylenol, eye drops, nasal spray   The day of surgery:                     Do not wear jewelry,             Do not wear lotions, powders, colognes, or deodorant.            Men may shave face and neck.            Do not bring valuables to the hospital.            [facility] is not responsible for any belongings or valuables.  Do NOT Smoke (Tobacco/Vaping) 24 hours prior to your procedure If you use a CPAP at night, you may bring all equipment for your overnight stay.   Contacts, glasses, dentures or bridgework may not be worn into surgery.      For patients admitted to the hospital, discharge time will be determined by your treatment team.   Patients discharged the day of  surgery will not be allowed to drive home, and someone needs to stay with them for 24 hours.    Special instructions:   - Preparing For Surgery  Before surgery, you can play an important role. Because skin is not sterile, your skin needs to be as free of germs as possible. You can reduce the number of germs on your skin by washing with CHG (chlorahexidine gluconate) Soap before surgery.  CHG is an antiseptic cleaner which kills germs and bonds with the skin to continue killing germs even after washing.    Oral Hygiene is also important to reduce your risk of infection.  Remember - BRUSH YOUR TEETH THE MORNING OF SURGERY WITH YOUR REGULAR TOOTHPASTE  Please do not use if you have an allergy to CHG or antibacterial soaps. If your skin becomes reddened/irritated stop using the CHG.  Do not shave (including legs and underarms) for at least 48 hours prior to first CHG shower. It is OK to shave your face.  Please follow these instructions carefully.   Shower the NIGHT BEFORE SURGERY and the MORNING OF SURGERY with DIAL Soap.   Pat yourself dry with a CLEAN TOWEL.  Wear CLEAN PAJAMAS to bed the night before  surgery  Place CLEAN SHEETS on your bed the night of your first shower and DO NOT SLEEP WITH PETS.   Day of Surgery: Please shower morning of surgery  Wear Clean/Comfortable clothing the morning of surgery Do not apply any deodorants/lotions.   Remember to brush your teeth WITH YOUR REGULAR TOOTHPASTE.   Questions were answered. Patient verbalized understanding of instructions.

## 2022-04-16 NOTE — Progress Notes (Signed)
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  Continues to be followed at [facility] for this.   Follows with pulmonologist 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 He was hospitalized in May 2023 with sepsis, community-acquired pneumonia with CT scan showing focal consolidations as well as heterogeneous areas of peripheral reticulation consistent with interstitial fibrosis.  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  Treated with Rocephin, Azithromycin and sent home on Augmentin with a steroid taper.  When last seen by [name] 01/20/2022 he reported feeling back to baseline.  PFTs 01/01/2022 showed moderate restriction with diffusion impairment.  PET scan 03/19/2022 showed hypermetabolic right lower lobe nodule concerning for bronchogenic carcinoma.  Navigational bronchoscopy recommended.   Recently established with cardiology at [facility] after moving to [facility] from [place].  He has a history of atrial fibrillation.  Per note by Dr. Lamar Blinks 10/28/2021, "Has fair exercise tolerance. He has had atrial fibrillation for 6 to 7 years. He is not symptomatic with atrial fibrillation and hence is not sure as to how many episodes he has. He has been on Eliquis since. No bleeding or stroke. Denies syncope or presyncope. Reports weakness in the muscles of his thighs and hip when he gets up."  Recommended continue Eliquis 5 mg twice daily.  ZIO monitor was ordered to assess A-fib burden, however results not available in Care Everywhere.   Follows with vascular surgery for history of EVAR in 2017 for AAA and carotid stenosis s/p left common carotid endarterectomy 01/30/2022 for asymptomatic critical stenosis.  Last seen by vascular surgery 02/17/2022,  doing well, recommended follow-up duplex in 6 months.  Follows with neurology at [facility] for history of polyneuropathy and bilateral lower extremity weakness.  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  Does have a history of chemotherapy ~3 years ago and prior heavy alcohol use.   Review of prior anesthesia records in care everywhere indicates history of difficult intubation secondary to small oral opening and limited neck range of motion.  Glidescope used.   Patient reported last dose Eliquis 04/13/2022.  He will need day of surgery labs and evaluation.   EKG 08/11/2021: Ectopic atrial tachycardia, unifocal.  Rate 103. Atrial premature complexes. Borderline prolonged QT interval   PET scan 03/19/2022: IMPRESSION: 1. Hypermetabolic RIGHT lower lobe pulmonary nodule which was new on CT 02/25/2022. Lesion is concerning for bronchogenic carcinoma. As only 1 month interval between new CT finding 02/25/2022, consider follow-up CT in 2 months to exclude inflammatory infectious nodule versus more immediate tissue sampling. 2. No evidence of metastatic disease on skull base FDG PET scan. 3. No metabolic activity associated with RIGHT hepatic lobe lesion favors benign etiology.    HRCT chest 02/25/2022: IMPRESSION: 1. 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 Findings are suggestive of an alternative diagnosis (not UIP) per consensus guidelines: Diagnosis of Idiopathic Pulmonary Fibrosis: An Official ATS/ERS/JRS/ALAT Clinical Practice Guideline. [place], Iss 5, [phone], Nov 29 2016. 2. New irregular solid pulmonary nodule of the right lower lobe measuring 9 mm in mean diameter. Consider one of the following in 3 months for both  low-risk and  high-risk individuals: (a) repeat chest CT, (b) follow-up PET-CT, or (c) tissue sampling. This recommendation follows the consensus statement: Guidelines for Management of Incidental Pulmonary Nodules Detected on CT Images: From the Fleischner Society 2017; Radiology 2017; 284:228-243. 3. Irregular indeterminate lesion of the inferior right hepatic lobe. Recommend contrast-enhanced liver MRI for further evaluation. 4. Aortic Atherosclerosis (ICD10-I70.0).    PFTs 01/01/2022: FVC 2.54 [6 7%], FEV1 2.19 [81%], F/F 86, TLC 4.16 [62%], DLCO 13.02 [56%]   CTA head neck 11/12/2021 (prior to left carotid endarterectomy): IMPRESSION: 1. No acute intracranial abnormality. Atrophy and chronic microvascular ischemic change in the white matter. 2. Right carotid widely patent in the neck with mild atherosclerotic disease 3. Extensive atherosclerotic disease left common carotid artery with severe stenosis. Left carotid bifurcation is patent. Left internal carotid artery widely patent and likely is partially supplied from the left external carotid artery collaterals. 4. Very small right vertebral artery with ends in PICA. Left vertebral artery widely patent 5. No intracranial large vessel occlusion 6. These results will be called to the ordering clinician or representative by the Radiologist Assistant, and communication documented in the PACS or [employer].   TTE 08/12/21:  1. Left ventricular ejection fraction, by estimation, is 50 to 55%. The  left ventricle has low normal function. The left ventricle has no regional  wall motion abnormalities. There is mild concentric left ventricular  hypertrophy. Left ventricular  diastolic parameters are indeterminate.   2. Right ventricular systolic function is normal. The right ventricular  size is normal. There is normal pulmonary artery systolic pressure.   3. Left atrial size was mildly dilated.   4. The pericardial effusion is circumferential.    5. The mitral valve is normal in structure. No evidence of mitral valve  regurgitation. No evidence of mitral stenosis.   6. The aortic valve is normal in structure. Aortic valve regurgitation is  not visualized. Aortic valve sclerosis is present, with no evidence of  aortic valve stenosis.   7. Aneurysm of the ascending aorta, measuring 45 mm. There is moderate  dilatation of the aortic root, measuring 40 mm.   8. The inferior vena cava is normal in size with greater than 50%  respiratory variability, suggesting right atrial pressure of 3 mmHg.     Wynonia Musty [facility] Short Stay Center/Anesthesiology Phone [phone] 04/16/2022 12:20 PM

## 2022-04-16 NOTE — Anesthesia Preprocedure Evaluation (Addendum)
Anesthesia Evaluation  Patient identified by MRN, date of birth, ID band Patient awake    Reviewed: Allergy & Precautions, NPO status , Patient's Chart, lab work & pertinent test results  Airway Mallampati: IV  TM Distance: >3 FB Neck ROM: Full    Dental no notable dental hx.    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 on home beta blockers Normal cardiovascular exam+ dysrhythmias Atrial Fibrillation      Neuro/Psych negative neurological ROS  negative psych ROS   GI/Hepatic negative GI ROS, Neg liver ROS,,,  Endo/Other  Hypothyroidism    Renal/GU negative Renal ROS     Musculoskeletal negative musculoskeletal ROS (+)    Abdominal   Peds  Hematology  (+) Blood dyscrasia (Eliquis), anemia   Anesthesia Other Findings right pulmonary nodule  Reproductive/Obstetrics                             Anesthesia Physical Anesthesia Plan  ASA: 3  Anesthesia Plan: General   Post-op Pain Management:    Induction: Intravenous  PONV Risk Score and Plan: 2 and Ondansetron, Dexamethasone and Treatment may vary due to age or medical condition  Airway Management Planned: Oral ETT and Video Laryngoscope Planned  Additional Equipment:   Intra-op Plan:   Post-operative Plan: Extubation in OR  Informed Consent: I have reviewed the patients History and Physical, chart, labs and discussed the procedure including the risks, benefits and alternatives for the proposed anesthesia with the patient or authorized representative who has indicated his/her understanding and acceptance.     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 Continues to be followed at [facility] for this.  Follows with pulmonologist [name] at [facility] for history of possible interstitial lung disease.He was hospitalized in May 2023 with sepsis, community-acquired pneumonia with CT scan showing focal consolidationsas well as heterogeneous areas of peripheral reticulation consistent with interstitial fibrosis. 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 Treated with Rocephin, Azithromycin and sent home on Augmentin with a steroid taper.When last seen by [name] 01/20/2022 he reported feeling back to baseline. PFTs 01/01/2022 showed moderate restriction with diffusion impairment. PET scan 03/19/2022 showed hypermetabolic right lower lobe nodule concerning for bronchogenic carcinoma.  Navigational bronchoscopy recommended.  Recently established with cardiology at [facility] after moving to [facility] from [place].He has a history of atrial fibrillation. Per note by Dr. Shantha7/31/2023, "Has fair exercise tolerance. He has had atrial fibrillation for 6 to 7 years. He is not symptomatic with atrial fibrillation and hence is not sure as to how many episodes he has. He has been on Eliquis since. No bleeding or stroke. Denies syncope or presyncope. Reports weakness in the muscles of his thighs and hip when he gets up."Recommended continue Eliquis 5 mg twice daily. ZIO monitor was ordered to assess A-fib burden, however results not available in Care Everywhere.  Follows with vascular surgery for history of EVAR in 2030fr AAA and carotid stenosis s/p left common carotid endarterectomy 01/30/2022 for asymptomatic critical stenosis.  Last seen by vascular surgery 02/17/2022, doing well,  recommended follow-up duplex in 6 months.  Follows with neurology at [facility] for history of polyneuropathy and bilateral lower  extremity weakness.  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  Does have a history of chemotherapy ~3 years ago and prior heavy alcohol use.  Review of prior anesthesia records in care everywhere indicates history of difficult intubation secondary to small oral opening and limited neck range of motion. Glidescope used.  Patient reported last dose Eliquis 04/13/2022.  He will need day of surgery labs and evaluation.  EKG 08/11/2021:Ectopic atrial tachycardia, unifocal.  Rate 103.Atrial premature complexes.Borderline prolonged QT interval  PET scan 03/19/2022: IMPRESSION: 1. Hypermetabolic RIGHT lower lobe pulmonary nodule which was new on CT 02/25/2022. Lesion is concerning for bronchogenic carcinoma. As only 1 month interval between new CT finding 02/25/2022, consider follow-up CT in 2 months to exclude inflammatory infectious nodule versus more immediate tissue sampling. 2. No evidence of metastatic disease on skull base FDG PET scan. 3. No metabolic activity associated with RIGHT hepatic lobe lesion favors benign etiology.   HRCT chest 02/25/2022: IMPRESSION: 1. 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 Findings are suggestive of an alternative diagnosis (not UIP) per consensus guidelines: Diagnosis of Idiopathic Pulmonary Fibrosis: An Official ATS/ERS/JRS/ALAT Clinical Practice Guideline. [place], Iss 5, [phone], Nov 29 2016. 2. New irregular solid pulmonary nodule of the right lower lobe measuring 9 mm in mean diameter. Consider one of the following in 3 months for both low-risk and high-risk individuals: (a) repeat chest CT, (b) follow-up PET-CT, or (c) tissue sampling. This recommendation follows the consensus statement:  Guidelines for Management of Incidental Pulmonary Nodules Detected on CT Images: From the Fleischner Society 2017; Radiology 2017; 284:228-243. 3. Irregular indeterminate lesion of the inferior right hepatic lobe. Recommend contrast-enhanced liver MRI for further evaluation. 4. Aortic Atherosclerosis (ICD10-I70.0).   PFTs 01/01/2022: FVC 2.54 [6 7%], FEV1 2.19 [81%], F/F 86, TLC 4.16 [62%], DLCO 13.02 [56%]  CTA head neck 11/12/2021 (prior to left carotid endarterectomy): IMPRESSION: 1. No acute intracranial abnormality. Atrophy and chronic microvascular ischemic change in the white matter. 2. Right carotid widely patent in the neck with mild atherosclerotic disease 3. Extensive atherosclerotic disease left common carotid artery with severe stenosis. Left carotid bifurcation is patent. Left internal carotid artery widely patent and likely is partially supplied from the left external carotid artery collaterals. 4. Very small right vertebral artery with ends in PICA. Left vertebral artery widely patent 5. No intracranial large vessel occlusion 6. These results will be called to the ordering clinician or representative by the Radiologist Assistant, and communication documented in the PACS or [employer].  TTE 08/12/21: 1. Left ventricular ejection fraction, by estimation, is 50 to 55%. The  left ventricle has low normal function. The left ventricle has no regional  wall motion abnormalities. There is mild concentric left ventricular  hypertrophy. Left ventricular  diastolic parameters are indeterminate.  2. Right ventricular systolic function is normal. The right ventricular  size is normal. There is normal pulmonary artery systolic pressure.  3. Left atrial size was mildly dilated.  4. The pericardial effusion is circumferential.  5. The mitral valve is normal in structure. No evidence of mitral valve  regurgitation. No evidence of mitral stenosis.  6. The aortic  valve is normal in structure. Aortic valve regurgitation is  not  visualized. Aortic valve sclerosis is present, with no evidence of  aortic valve stenosis.  7. Aneurysm of the ascending aorta, measuring 45 mm. There is moderate  dilatation of the aortic root, measuring 40 mm.  8. The inferior vena cava is normal in size with greater than 50%  respiratory variability, suggesting right atrial pressure of 3 mmHg.    )        Anesthesia Quick Evaluation

## 2022-04-17 ENCOUNTER — Ambulatory Visit (HOSPITAL_COMMUNITY): Payer: Medicare Other | Admitting: Physician Assistant

## 2022-04-17 ENCOUNTER — Ambulatory Visit (HOSPITAL_COMMUNITY)
Admission: RE | Admit: 2022-04-17 | Discharge: 2022-04-17 | Disposition: A | Discharge status: Home / Self Care | Payer: Medicare Other | Source: Ambulatory Visit | Attending: Student | Admitting: Student

## 2022-04-17 ENCOUNTER — Encounter (HOSPITAL_COMMUNITY)
Admission: RE | Disposition: A | Discharge status: Home / Self Care | Payer: Self-pay | Source: Ambulatory Visit | Attending: Student

## 2022-04-17 ENCOUNTER — Telehealth: Payer: Self-pay | Admitting: Student

## 2022-04-17 ENCOUNTER — Ambulatory Visit (HOSPITAL_COMMUNITY): Payer: Medicare Other

## 2022-04-17 ENCOUNTER — Encounter (HOSPITAL_COMMUNITY): Payer: Self-pay | Admitting: Student

## 2022-04-17 ENCOUNTER — Ambulatory Visit (HOSPITAL_BASED_OUTPATIENT_CLINIC_OR_DEPARTMENT_OTHER): Payer: Medicare Other | Admitting: Physician Assistant

## 2022-04-17 DIAGNOSIS — D638 Anemia in other chronic diseases classified elsewhere: Secondary | ICD-10-CM | POA: Diagnosis not present

## 2022-04-17 DIAGNOSIS — J45909 Unspecified asthma, uncomplicated: Secondary | ICD-10-CM

## 2022-04-17 DIAGNOSIS — I4891 Unspecified atrial fibrillation: Secondary | ICD-10-CM | POA: Diagnosis not present

## 2022-04-17 DIAGNOSIS — Z87891 Personal history of nicotine dependence: Secondary | ICD-10-CM

## 2022-04-17 DIAGNOSIS — R911 Solitary pulmonary nodule: Secondary | ICD-10-CM | POA: Insufficient documentation

## 2022-04-17 DIAGNOSIS — I7121 Aneurysm of the ascending aorta, without rupture: Secondary | ICD-10-CM | POA: Insufficient documentation

## 2022-04-17 DIAGNOSIS — I10 Essential (primary) hypertension: Secondary | ICD-10-CM

## 2022-04-17 DIAGNOSIS — E039 Hypothyroidism, unspecified: Secondary | ICD-10-CM

## 2022-04-17 DIAGNOSIS — Z8521 Personal history of malignant neoplasm of larynx: Secondary | ICD-10-CM | POA: Insufficient documentation

## 2022-04-17 DIAGNOSIS — I700 Atherosclerosis of aorta: Secondary | ICD-10-CM | POA: Insufficient documentation

## 2022-04-17 DIAGNOSIS — Z79899 Other long term (current) drug therapy: Secondary | ICD-10-CM | POA: Insufficient documentation

## 2022-04-17 HISTORY — PX: BRONCHIAL WASHINGS: SHX5105

## 2022-04-17 HISTORY — DX: Cardiac arrhythmia, unspecified: I49.9

## 2022-04-17 HISTORY — PX: BRONCHIAL BRUSHINGS: SHX5108

## 2022-04-17 HISTORY — PX: BRONCHIAL BIOPSY: SHX5109

## 2022-04-17 HISTORY — PX: BRONCHIAL NEEDLE ASPIRATION BIOPSY: SHX5106

## 2022-04-17 HISTORY — PX: VIDEO BRONCHOSCOPY WITH RADIAL ENDOBRONCHIAL ULTRASOUND: SHX6849

## 2022-04-17 LAB — BASIC METABOLIC PANEL
Anion gap: 11 (ref 5–15)
BUN: 25 mg/dL — ABNORMAL HIGH (ref 8–23)
CO2: 22 mmol/L (ref 22–32)
Calcium: 9.1 mg/dL (ref 8.9–10.3)
Chloride: 106 mmol/L (ref 98–111)
Creatinine, Ser: 1.1 mg/dL (ref 0.61–1.24)
GFR, Estimated: >60 mL/min (ref >60)
Glucose, Bld: 93 mg/dL (ref 70–99)
Potassium: 3.9 mmol/L (ref 3.5–5.1)
Sodium: 139 mmol/L (ref 135–145)

## 2022-04-17 LAB — CBC
HCT: 36.7 % — ABNORMAL LOW (ref 39.0–52.0)
Hemoglobin: 11.8 g/dL — ABNORMAL LOW (ref 13.0–17.0)
MCH: 32.4 pg (ref 26.0–34.0)
MCHC: 32.2 g/dL (ref 30.0–36.0)
MCV: 100.8 fL — ABNORMAL HIGH (ref 80.0–100.0)
Platelets: 189 10*3/uL (ref 150–400)
RBC: 3.64 MIL/uL — ABNORMAL LOW (ref 4.22–5.81)
RDW: 16.4 % — ABNORMAL HIGH (ref 11.5–15.5)
WBC: 5.6 10*3/uL (ref 4.0–10.5)
nRBC: 0 % (ref 0.0–0.2)

## 2022-04-17 SURGERY — ROBOTIC ASSISTED NAVIGATIONAL BRONCHOSCOPY
Anesthesia: General

## 2022-04-17 MED ORDER — ONDANSETRON HCL 4 MG/2ML IJ SOLN
INTRAMUSCULAR | Status: DC | PRN
  Administered 2022-04-17: 4 mg via INTRAVENOUS

## 2022-04-17 MED ORDER — AMISULPRIDE (ANTIEMETIC) 5 MG/2ML IV SOLN: 10.0000 mg | Freq: Once | INTRAVENOUS | Status: DC | PRN

## 2022-04-17 MED ORDER — PROPOFOL 10 MG/ML IV BOLUS
INTRAVENOUS | Status: DC | PRN
  Administered 2022-04-17: 200 mg via INTRAVENOUS

## 2022-04-17 MED ORDER — SUGAMMADEX SODIUM 200 MG/2ML IV SOLN
INTRAVENOUS | Status: DC | PRN
  Administered 2022-04-17: 177 mg via INTRAVENOUS

## 2022-04-17 MED ORDER — LIDOCAINE 2% (20 MG/ML) 5 ML SYRINGE
INTRAMUSCULAR | Status: DC | PRN
  Administered 2022-04-17: 60 mg via INTRAVENOUS

## 2022-04-17 MED ORDER — CHLORHEXIDINE GLUCONATE 0.12 % MT SOLN
15.0000 mL | Freq: Once | OROMUCOSAL | Status: AC
  Administered 2022-04-17: 15 mL via OROMUCOSAL
  Filled 2022-04-17 (×2): qty 15

## 2022-04-17 MED ORDER — ROCURONIUM BROMIDE 10 MG/ML (PF) SYRINGE
PREFILLED_SYRINGE | INTRAVENOUS | Status: DC | PRN
  Administered 2022-04-17: 40 mg via INTRAVENOUS
  Administered 2022-04-17: 10 mg via INTRAVENOUS

## 2022-04-17 MED ORDER — EPHEDRINE SULFATE-NACL 50-0.9 MG/10ML-% IV SOSY
PREFILLED_SYRINGE | INTRAVENOUS | Status: DC | PRN
  Administered 2022-04-17 (×2): 2.5 mg via INTRAVENOUS

## 2022-04-17 MED ORDER — LACTATED RINGERS IV SOLN: INTRAVENOUS | Status: DC

## 2022-04-17 MED ORDER — ONDANSETRON HCL 4 MG/2ML IJ SOLN: 4.0000 mg | Freq: Once | INTRAMUSCULAR | Status: DC | PRN

## 2022-04-17 MED ORDER — DEXAMETHASONE SODIUM PHOSPHATE 10 MG/ML IJ SOLN
INTRAMUSCULAR | Status: DC | PRN
  Administered 2022-04-17: 10 mg via INTRAVENOUS

## 2022-04-17 MED ORDER — FENTANYL CITRATE (PF) 100 MCG/2ML IJ SOLN: 25.0000 ug | INTRAMUSCULAR | Status: DC | PRN

## 2022-04-17 MED ORDER — ACETAMINOPHEN 10 MG/ML IV SOLN: 1000.0000 mg | Freq: Once | INTRAVENOUS | Status: DC | PRN

## 2022-04-17 MED ORDER — PHENYLEPHRINE HCL-NACL 20-0.9 MG/250ML-% IV SOLN
INTRAVENOUS | Status: DC | PRN
  Administered 2022-04-17: 30 ug/min via INTRAVENOUS

## 2022-04-17 MED ORDER — PHENYLEPHRINE 80 MCG/ML (10ML) SYRINGE FOR IV PUSH (FOR BLOOD PRESSURE SUPPORT)
PREFILLED_SYRINGE | INTRAVENOUS | Status: DC | PRN
  Administered 2022-04-17 (×2): 160 ug via INTRAVENOUS

## 2022-04-17 MED ORDER — PROPOFOL 500 MG/50ML IV EMUL
INTRAVENOUS | Status: DC | PRN
  Administered 2022-04-17: 100 ug/kg/min via INTRAVENOUS

## 2022-04-17 NOTE — Transfer of Care (Signed)
Immediate Anesthesia Transfer of Care Note  Patient: Peter Greene  Procedure(s) Performed: ROBOTIC ASSISTED NAVIGATIONAL BRONCHOSCOPY BRONCHIAL NEEDLE ASPIRATION BIOPSIES BRONCHIAL BIOPSIES BRONCHIAL WASHINGS BRONCHIAL BRUSHINGS VIDEO BRONCHOSCOPY WITH RADIAL ENDOBRONCHIAL ULTRASOUND  Patient Location: PACU  Anesthesia Type:General  Level of Consciousness: drowsy  Airway & Oxygen Therapy: Patient Spontanous Breathing  Post-op Assessment: Report given to RN and Post -op Vital signs reviewed and stable  Post vital signs: Reviewed and stable  Last Vitals:  Vitals Value Taken Time  BP 100/67   Temp    Pulse 63 04/17/22 1113  Resp 14 04/17/22 1113  SpO2 93 % 04/17/22 1113  Vitals shown include unvalidated device data.  Last Pain:  Vitals:   04/17/22 0821  TempSrc:   PainSc: 0-No pain         Complications:  Encounter Notable Events  Notable Event Outcome Phase Comment  Difficult to intubate - unexpected  Intraprocedure Filed from anesthesia note documentation.

## 2022-04-17 NOTE — Anesthesia Procedure Notes (Signed)
Procedure Name: Intubation Date/Time: 04/17/2022 10:02 AM  Performed by: Lorie Phenix, CRNAPre-anesthesia Checklist: Patient identified, Emergency Drugs available, Suction available and Patient being monitored Patient Re-evaluated:Patient Re-evaluated prior to induction Oxygen Delivery Method: Circle system utilized Preoxygenation: Pre-oxygenation with 100% oxygen Induction Type: IV induction Ventilation: Mask ventilation without difficulty and Oral airway inserted - appropriate to patient size Laryngoscope Size: Glidescope and 4 Grade View: Grade I Tube type: Oral Tube size: 8.5 mm Number of attempts: 1 Airway Equipment and Method: Stylet and Oral airway Placement Confirmation: ETT inserted through vocal cords under direct vision, positive ETCO2 and breath sounds checked- equal and bilateral Secured at: 24 cm Tube secured with: Tape Dental Injury: Teeth and Oropharynx as per pre-operative assessment  Difficulty Due To: Difficulty was unanticipated, Difficult Airway- due to reduced neck mobility, Difficult Airway- due to anterior larynx and Difficult Airway- due to limited oral opening

## 2022-04-17 NOTE — Discharge Instructions (Signed)
-  Small clots of blood and bloody streaks in cough normal for first 24-72 hours after procedure. If coughing up clot size of  your thumb or bigger then call our office [phone] and if persistent, especially if you also have worsening shortness of breath as well then would plan to head to ED. - As long as bloody cough subsiding ok to resume aspirin 81 mg tomorrow, Eliquis morning of 1/20. - I will request clinic follow up in 2-3 weeks to review results

## 2022-04-17 NOTE — H&P (Signed)
Peter Greene is an 82 y.o. male.   Chief Complaint: pulmonary nodule HPI: 81yM history of AAA, laryngeal CA, AF, MDS referred for pulmonary nodule.   No bothersome cough, change in DOE.   Past Medical History:  Diagnosis Date   AAA (abdominal aortic aneurysm) ([place])    Carotid artery occlusion    Complication of anesthesia    trouble intubating   Dysrhythmia    History of bone marrow transplant ([place])    Hyperlipidemia    Interstitial lung disease ([place])    Laryngeal cancer ([place]) 2004   Pneumonia    x 2   Thyroid disease     Past Surgical History:  Procedure Laterality Date   ABDOMINAL AORTIC ANEURYSM REPAIR     BONE MARROW TRANSPLANT     CARDIAC CATHETERIZATION     ENDARTERECTOMY Left 01/30/2022   Procedure: ENDARTERECTOMY CAROTID;  Surgeon: Broadus John, MD;  Location: [facility];  Service: Vascular;  Laterality: Left;   EYE SURGERY     HERNIA REPAIR     KNEE SURGERY Bilateral    Joint replacements   MOHS SURGERY     x 2 R & L   PATCH ANGIOPLASTY Left 01/30/2022   Procedure: PATCH ANGIOPLASTY WITH Rueben Bash BIOLOGIC PATCH [id number];  Surgeon: Broadus John, MD;  Location: [facility];  Service: Vascular;  Laterality: Left;   TONSILLECTOMY  1946    History reviewed. No pertinent family history. Social History:  reports that he quit smoking about 40 years ago. His smoking use included cigarettes. He has never been exposed to tobacco smoke. He has never used smokeless tobacco. He reports current alcohol use. He reports that he does not use drugs.  Allergies:  Allergies  Allergen Reactions   Other Other (See Comments)    Patient states he difficult to intubate    Medications Prior to Admission  Medication Sig Dispense Refill   acetaminophen (TYLENOL) 500 MG tablet Take 500 mg by mouth every 6 (six) hours as needed for moderate pain.     aspirin EC 81 MG tablet Take 81 mg by mouth in the morning. Swallow whole.     atorvastatin (LIPITOR) 40 MG tablet Take 40 mg by mouth  in the morning.     cycloSPORINE (RESTASIS) 0.05 % ophthalmic emulsion Place 1 drop into both eyes 2 (two) times daily as needed (dry/irritated eyes.).     gabapentin (NEURONTIN) 100 MG capsule Take 200 mg by mouth 2 (two) times daily.     gabapentin (NEURONTIN) 300 MG capsule Take 300 mg by mouth 2 (two) times daily as needed (neuropathy).     ipratropium (ATROVENT) 0.03 % nasal spray Place 2 sprays into both nostrils 2 (two) times daily as needed for rhinitis.     Krill Oil 350 MG CAPS Take 350 mg by mouth every evening.     levothyroxine (SYNTHROID) 25 MCG tablet Take 25 mcg by mouth every evening.     Melatonin 10 MG TABS Take by mouth at bedtime.     metoprolol tartrate (LOPRESSOR) 25 MG tablet Take 25 mg by mouth 2 (two) times daily.     Multiple Vitamin (MULTIVITAMIN WITH MINERALS) TABS tablet Take 1 tablet by mouth in the morning. Centrum     neomycin-polymyxin b-dexamethasone (MAXITROL) 3.5-10000-0.1 SUSP Place 1 drop into both eyes every evening.     oxybutynin (DITROPAN-XL) 10 MG 24 hr tablet Take 10 mg by mouth every evening.     pantoprazole (PROTONIX) 20 MG tablet Take 20  mg by mouth every evening.     predniSONE (DELTASONE) 1 MG tablet Take 1 mg by mouth every other day. In the morning.     Propylene Glycol (SYSTANE BALANCE OP) Place 1 drop into both eyes 3 (three) times daily as needed (dry/irritated eyes.).     tamsulosin (FLOMAX) 0.4 MG CAPS capsule Take 0.4 mg by mouth in the morning.     valACYclovir (VALTREX) 500 MG tablet Take 500 mg by mouth in the morning.     ELIQUIS 5 MG TABS tablet Take 5 mg by mouth 2 (two) times daily.     HYDROcodone-acetaminophen (NORCO) 5-325 MG tablet Take 1 tablet by mouth every 6 (six) hours as needed for moderate pain. (Patient not taking: Reported on 02/17/2022) 8 tablet 0    Results for orders placed or performed during the hospital encounter of 04/17/22 (from the past 48 hour(s))  Basic metabolic panel per protocol     Status: Abnormal    Collection Time: 04/17/22  8:04 AM  Result Value Ref Range   Sodium 139 135 - 145 mmol/L   Potassium 3.9 3.5 - 5.1 mmol/L   Chloride 106 98 - 111 mmol/L   CO2 22 22 - 32 mmol/L   Glucose, Bld 93 70 - 99 mg/dL    Comment: Glucose reference range applies only to samples taken after fasting for at least 8 hours.   BUN 25 (H) 8 - 23 mg/dL   Creatinine, Ser 1.10 0.61 - 1.24 mg/dL   Calcium 9.1 8.9 - 10.3 mg/dL   GFR, Estimated >60 >60 mL/min    Comment: (NOTE) Calculated using the CKD-EPI Creatinine Equation (2021)    Anion gap 11 5 - 15    Comment: Performed at [facility] [address] [place], [place] [address]  CBC per protocol     Status: Abnormal   Collection Time: 04/17/22  8:04 AM  Result Value Ref Range   WBC 5.6 4.0 - 10.5 K/uL   RBC 3.64 (L) 4.22 - 5.81 MIL/uL   Hemoglobin 11.8 (L) 13.0 - 17.0 g/dL   HCT 36.7 (L) 39.0 - 52.0 %   MCV 100.8 (H) 80.0 - 100.0 fL   MCH 32.4 26.0 - 34.0 pg   MCHC 32.2 30.0 - 36.0 g/dL   RDW 16.4 (H) 11.5 - 15.5 %   Platelets 189 150 - 400 K/uL   nRBC 0.0 0.0 - 0.2 %    Comment: Performed at [facility], [place] [address], [place], [place] [address]   No results found.  12 point review of systems is negative except as in HPI  Blood pressure 124/69, pulse 65, temperature 97.6 F (36.4 C), temperature source Oral, resp. rate 17, height '5\' 8"'$  (1.727 m), weight 88.5 kg, SpO2 95 %.   General appearance: 82 y.o., male, NAD, conversant  Eyes: anicteric sclerae; PERRL, tracking appropriately HENT: NCAT; MMM, mallampati 4 Neck: Trachea midline; no lymphadenopathy, no JVD Lungs:diminished bl, with normal respiratory effort CV: RRR, no murmur  Abdomen: Soft, non-tender; non-distended, BS present  Extremities: No peripheral edema, warm Skin: Normal turgor and texture; no rash Psych: Appropriate affect Neuro: Alert and oriented to person and place, no focal deficit   Assessment/Plan # RLL pulmonary nodule - navigation bronch  under general  Maryjane Hurter, MD 04/17/2022, 9:40 AM

## 2022-04-17 NOTE — Telephone Encounter (Signed)
Can we get clinic appt with me or PM in 2-3 weeks to follow up on biopsy results?

## 2022-04-17 NOTE — Op Note (Signed)
Video Bronchoscopy with Robotic Assisted Bronchoscopic Navigation   Date of Operation: 04/17/2022   Pre-op Diagnosis: Pulmonary nodule  Post-op Diagnosis: Pulmonary nodule  Surgeon: Walker Shadow  Anesthesia: General endotracheal anesthesia  Operation: Flexible video fiberoptic bronchoscopy with robotic assistance and biopsies.  Estimated Blood Loss: Minimal  Complications: None  Indications and History: Peter Greene is a 82 y.o. male with history of RLL pulmonary nodule. The risks, benefits, complications, treatment options and expected outcomes were discussed with the patient.  The possibilities of pneumothorax, pneumonia, reaction to medication, pulmonary aspiration, perforation of a viscus, bleeding, failure to diagnose a condition and creating a complication requiring transfusion or operation were discussed with the patient who freely signed the consent.    Description of Procedure: The patient was seen in the Preoperative Area, was examined and was deemed appropriate to proceed.  The patient was taken to [facility] endoscopy room 3, identified as Peter Greene and the procedure verified as Flexible Video Fiberoptic Bronchoscopy.  A Time Out was held and the above information confirmed.   Prior to the date of the procedure a high-resolution CT scan of the chest was performed. Utilizing ION software program a virtual tracheobronchial tree was generated to allow the creation of distinct navigation pathways to the patient's parenchymal abnormalities. After being taken to the operating room general anesthesia was initiated and the patient  was orally intubated. The video fiberoptic bronchoscope was introduced via the endotracheal tube and a general inspection was performed which showed normal right and left lung anatomy, aspiration of the bilateral mainstems was completed to remove any remaining secretions. Robotic catheter inserted into patient's endotracheal tube.   Target #1 RLL pulmonary  nodule: The distinct navigation pathways prepared prior to this procedure were then utilized to navigate to patient's lesion identified on CT scan. CIOS imaging was used to aid navigation and confirm ideal location for biopsy. The robotic catheter was secured into place and the vision probe was withdrawn.  Lesion location was approximated using fluoroscopy and radial endobronchial ultrasound for peripheral targeting. Under fluoroscopic guidance transbronchial needle brushings, transbronchial needle biopsies, and transbronchial forceps biopsies were performed to be sent for cytology and pathology. A bronchioalveolar lavage was performed in the RLL and sent for cytology.    At the end of the procedure a general airway inspection was performed and there was no evidence of active bleeding. The bronchoscope was removed.  The patient tolerated the procedure well. There was no significant blood loss and there were no obvious complications. A post-procedural chest x-ray is pending.  Samples Target #1: 1. Transbronchial needle brushings from RLL nodule 2. Transbronchial Wang needle biopsies from RLL nodule 3. Transbronchial forceps biopsies from RLL nodule 4. Bronchoalveolar lavage from RLL nodule 5. Endobronchial biopsies from RLL nodule   Plans:  The patient will be discharged from the PACU to home when recovered from anesthesia and after chest x-ray is reviewed. We will review the cytology, pathology and microbiology results with the patient when they become available. Outpatient followup will be with myself or [name] in 2-3 weeks to review results.

## 2022-04-17 NOTE — Anesthesia Postprocedure Evaluation (Signed)
Anesthesia Post Note  Patient: Peter Greene  Procedure(s) Performed: ROBOTIC ASSISTED NAVIGATIONAL BRONCHOSCOPY BRONCHIAL NEEDLE ASPIRATION BIOPSIES BRONCHIAL BIOPSIES BRONCHIAL WASHINGS BRONCHIAL BRUSHINGS VIDEO BRONCHOSCOPY WITH RADIAL ENDOBRONCHIAL ULTRASOUND     Patient location during evaluation: PACU Anesthesia Type: General Level of consciousness: awake Pain management: pain level controlled Vital Signs Assessment: post-procedure vital signs reviewed and stable Respiratory status: spontaneous breathing, nonlabored ventilation and respiratory function stable Cardiovascular status: blood pressure returned to baseline and stable Postop Assessment: no apparent nausea or vomiting Anesthetic complications: yes   Encounter Notable Events  Notable Event Outcome Phase Comment  Difficult to intubate - unexpected  Intraprocedure Filed from anesthesia note documentation.    Last Vitals:  Vitals:   04/17/22 1130 04/17/22 1145  BP: (!) 93/51 102/62  Pulse: 63 (!) 57  Resp: 16 16  Temp:  36.5 C  SpO2: 94% 98%    Last Pain:  Vitals:   04/17/22 1130  TempSrc:   PainSc: 0-No pain                 Deago Burruss P Aanchal Cope

## 2022-04-17 NOTE — Telephone Encounter (Signed)
Patient is going out of town the whole month of February, leaving 1/29- Patient was scheduled 04/25/2022 with Dr. Verlee Monte due to wanting to be seen prior to leaving. If results are not in- patient states he would try to do a virtual visit.

## 2022-04-20 ENCOUNTER — Encounter (HOSPITAL_COMMUNITY): Payer: Self-pay | Admitting: Student

## 2022-04-21 LAB — CYTOLOGY - NON PAP

## 2022-04-23 NOTE — Progress Notes (Signed)
Synopsis: Referred for pulmonary nodule by Burnard Bunting, MD  Subjective:   PATIENT ID: Peter Greene GENDER: male DOB: 1940/08/02, MRN: [id number]  Chief Complaint  Patient presents with   Follow-up   82 year old with history of laryngeal cancer, atrial fibrillation, myelodysplastic syndrome.  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   Used to follow with Dr.Thomas J. 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  He appears to have had surgical lung biopsy in the past but the patient does not recall the date of the procedure.  He is currently not on any specific treatments for interstitial lung disease and is transferring care as he moved [place] to be closer to his family.   He was hospitalized in the middle of May 2023 with sepsis, community-acquired pneumonia with CT scan showing focal consolidations from baseline vesicular.  Years.  Treated with Rocephin, Azithromycin and sent home on Augmentin with a steroid taper.  Overall he feels improved and back to baseline  Interval HPI:  Cold trip to [place] unfortunately. Too cold even for golf. No CP, change in dyspnea. Still has occaisonal cough with small clot of blood.   No change in DOE since last visit. Remains on prednisone 1 mg every other day.   Otherwise pertinent review of systems is negative.  Past Medical History:  Diagnosis Date   AAA (abdominal aortic aneurysm) ([place])    Carotid artery occlusion    Complication of anesthesia    trouble intubating   Dysrhythmia    History of bone marrow transplant ([place])    Hyperlipidemia    Interstitial lung disease ([place])    Laryngeal cancer ([place]) 2004   Pneumonia    x 2   Thyroid disease      No family history on  file.   Past Surgical History:  Procedure Laterality Date   ABDOMINAL AORTIC ANEURYSM REPAIR     BONE MARROW TRANSPLANT     BRONCHIAL BIOPSY  04/17/2022   Procedure: BRONCHIAL BIOPSIES;  Surgeon: Maryjane Hurter, MD;  Location[facility] ENDOSCOPY;  Service: Pulmonary;;   BRONCHIAL BRUSHINGS  04/17/2022   Procedure: BRONCHIAL BRUSHINGS;  Surgeon: Maryjane Hurter, MD;  Location: [facility] ENDOSCOPY;  Service: Pulmonary;;   BRONCHIAL NEEDLE ASPIRATION BIOPSY  04/17/2022   Procedure: BRONCHIAL NEEDLE ASPIRATION BIOPSIES;  Surgeon: Maryjane Hurter, MD;  Location: [facility] ENDOSCOPY;  Service: Pulmonary;;   BRONCHIAL WASHINGS  04/17/2022   Procedure: BRONCHIAL WASHINGS;  Surgeon: Maryjane Hurter, MD;  Location: [facility] ENDOSCOPY;  Service: Pulmonary;;   CARDIAC CATHETERIZATION     ENDARTERECTOMY Left 01/30/2022   Procedure: ENDARTERECTOMY CAROTID;  Surgeon: Broadus John, MD;  Location: [facility];  Service: Vascular;  Laterality: Left;   EYE SURGERY     HERNIA REPAIR     KNEE SURGERY Bilateral    Joint replacements   MOHS SURGERY     x 2 R & L   PATCH ANGIOPLASTY Left 01/30/2022   Procedure: [place] [id number];  Surgeon: Broadus John, MD;  Location: [facility];  Service: Vascular;  Laterality: Left;   [place] ULTRASOUND  04/17/2022   Procedure: VIDEO BRONCHOSCOPY WITH RADIAL ENDOBRONCHIAL ULTRASOUND;  Surgeon: Maryjane Hurter, MD;  Location: [facility]  ENDOSCOPY;  Service: Pulmonary;;    Social History   Socioeconomic History   Marital status: Married    Spouse name: Mardene Celeste   Number of children: 1   Years of education: Not on file   Highest education level: Not on file  Occupational History   Not on file  Tobacco Use   Smoking status: Former    Types: Cigarettes    Quit date: 1984    Years since quitting: 40.0    Passive exposure: Never   Smokeless tobacco: Never  Vaping Use   Vaping Use: Never used   Substance and Sexual Activity   Alcohol use: Yes    Comment: occasionally   Drug use: Never   Sexual activity: Not Currently  Other Topics Concern   Not on file  Social History Narrative   Not on file   Social Determinants of Health   Financial Resource Strain: Not on file  Food Insecurity: Not on file  Transportation Needs: Not on file  Physical Activity: Not on file  Stress: Not on file  Social Connections: Not on file  Intimate Partner Violence: Not on file     Allergies  Allergen Reactions   Other Other (See Comments)    Patient states he difficult to intubate     Outpatient Medications Prior to Visit  Medication Sig Dispense Refill   acetaminophen (TYLENOL) 500 MG tablet Take 500 mg by mouth every 6 (six) hours as needed for moderate pain.     aspirin EC 81 MG tablet Take 81 mg by mouth in the morning. Swallow whole.     atorvastatin (LIPITOR) 40 MG tablet Take 40 mg by mouth in the morning.     cycloSPORINE (RESTASIS) 0.05 % ophthalmic emulsion Place 1 drop into both eyes 2 (two) times daily as needed (dry/irritated eyes.).     ELIQUIS 5 MG TABS tablet Take 5 mg by mouth 2 (two) times daily.     gabapentin (NEURONTIN) 100 MG capsule Take 200 mg by mouth 2 (two) times daily.     gabapentin (NEURONTIN) 300 MG capsule Take 300 mg by mouth 2 (two) times daily as needed (neuropathy).     ipratropium (ATROVENT) 0.03 % nasal spray Place 2 sprays into both nostrils 2 (two) times daily as needed for rhinitis.     Krill Oil 350 MG CAPS Take 350 mg by mouth every evening.     levothyroxine (SYNTHROID) 25 MCG tablet Take 25 mcg by mouth every evening.     Melatonin 10 MG TABS Take by mouth at bedtime.     metoprolol tartrate (LOPRESSOR) 25 MG tablet Take 25 mg by mouth 2 (two) times daily.     Multiple Vitamin (MULTIVITAMIN WITH MINERALS) TABS tablet Take 1 tablet by mouth in the morning. Centrum     neomycin-polymyxin b-dexamethasone (MAXITROL) 3.5-10000-0.1 SUSP Place 1 drop  into both eyes every evening.     oxybutynin (DITROPAN-XL) 10 MG 24 hr tablet Take 10 mg by mouth every evening.     pantoprazole (PROTONIX) 20 MG tablet Take 20 mg by mouth every evening.     predniSONE (DELTASONE) 1 MG tablet Take 1 mg by mouth every other day. In the morning.     Propylene Glycol (SYSTANE BALANCE OP) Place 1 drop into both eyes 3 (three) times daily as needed (dry/irritated eyes.).     tamsulosin (FLOMAX) 0.4 MG CAPS capsule Take 0.4 mg by mouth in the morning.     valACYclovir (VALTREX) 500 MG tablet  Take 500 mg by mouth in the morning.     HYDROcodone-acetaminophen (NORCO) 5-325 MG tablet Take 1 tablet by mouth every 6 (six) hours as needed for moderate pain. (Patient not taking: Reported on 02/17/2022) 8 tablet 0   No facility-administered medications prior to visit.       Objective:   Physical Exam:  General appearance: 82 y.o., male, NAD, conversant  Eyes: anicteric sclerae; PERRL, tracking appropriately HENT: NCAT; MMM Neck: Trachea midline; no lymphadenopathy, no JVD Lungs: +crackles in bases, with normal respiratory effort CV: RRR, no murmur  Abdomen: Soft, non-tender; non-distended, BS present  Extremities: No peripheral edema, warm Skin: Normal turgor and texture; no rash Psych: Appropriate affect Neuro: Alert and oriented to person and place, no focal deficit     Vitals:   04/25/22 1434  BP: 118/70  Pulse: (!) 57  SpO2: 96%  Weight: 195 lb (88.5 kg)  Height: '5\' 8"'$  (1.727 m)   96% on  RA BMI Readings from Last 3 Encounters:  04/25/22 29.65 kg/m  04/17/22 29.65 kg/m  02/17/22 30.41 kg/m   Wt Readings from Last 3 Encounters:  04/25/22 195 lb (88.5 kg)  04/17/22 195 lb (88.5 kg)  02/17/22 200 lb (90.7 kg)     CBC    Component Value Date/Time   WBC 5.6 04/17/2022 0804   RBC 3.64 (L) 04/17/2022 0804   HGB 11.8 (L) 04/17/2022 0804   HCT 36.7 (L) 04/17/2022 0804   PLT 189 04/17/2022 0804   MCV 100.8 (H) 04/17/2022 0804   MCH  32.4 04/17/2022 0804   MCHC 32.2 04/17/2022 0804   RDW 16.4 (H) 04/17/2022 0804   LYMPHSABS 1.4 08/14/2021 0443   MONOABS 0.6 08/14/2021 0443   EOSABS 0.0 08/14/2021 0443   BASOSABS 0.0 08/14/2021 0443     Chest Imaging: CXR post bronch without PTX   Pulmonary Functions Testing Results:    Latest Ref Rng & Units 01/01/2022    3:53 PM  PFT Results  FVC-Pre L 2.47   FVC-Predicted Pre % 65   FVC-Post L 2.54   FVC-Predicted Post % 67   Pre FEV1/FVC % % 85   Post FEV1/FCV % % 86   FEV1-Pre L 2.09   FEV1-Predicted Pre % 77   FEV1-Post L 2.19   DLCO uncorrected ml/min/mmHg 13.02   DLCO UNC% % 56   DLCO corrected ml/min/mmHg 14.71   DLCO COR %Predicted % 63   DLVA Predicted % 94   TLC L 4.16   TLC % Predicted % 62   RV % Predicted % 63   Mild restriction, moderately reduced diffusing capacity       Assessment & Plan:   # RLL pulmonary nodule  # ILD # Restrictive ventilatory defect with low ERV but also moderately reduced diffusing capacity Reportedly treated in past for post engraftment idiopathic pneumonia syndrome with prolonged steroid course. Air trapping on CT Chest with foci of honeycomb change raise question of HP though, CTD-ILD post BMT an alternative possibility.  Plan: - CT Chest in early March with clinic visit afterward to review. If interval growth then repeat nav with Dr. Valeta Harms or Dr. Lamonte Sakai - would stop prednisone but call our clinic [phone] if cough/worsening trouble breathing after stopping - breathing tests we will schedule for same day as next clinic visit in March, if progression consideration of restarting prednisone vs antifibrotic - discuss obtaining HP panel, CTD serologies next visit     Maryjane Hurter, MD [facility]  04/25/2022 2:47 PM

## 2022-04-25 ENCOUNTER — Encounter: Payer: Self-pay | Admitting: Student

## 2022-04-25 ENCOUNTER — Ambulatory Visit (INDEPENDENT_AMBULATORY_CARE_PROVIDER_SITE_OTHER): Payer: Medicare Other | Admitting: Student

## 2022-04-25 VITALS — BP 118/70 | HR 57 | Ht 68.0 in | Wt 195.0 lb

## 2022-04-25 DIAGNOSIS — J849 Interstitial pulmonary disease, unspecified: Secondary | ICD-10-CM | POA: Diagnosis not present

## 2022-04-25 DIAGNOSIS — R911 Solitary pulmonary nodule: Secondary | ICD-10-CM | POA: Diagnosis not present

## 2022-04-25 NOTE — Patient Instructions (Addendum)
-  CT Chest in early March with clinic visit afterward to review with either myself, Dr. Valeta Harms, or Dr. Lamonte Sakai - would stop prednisone but call our clinic [phone] if cough/worsening trouble breathing after stopping - breathing tests we will schedule for same day as next clinic visit in March

## 2022-05-23 ENCOUNTER — Ambulatory Visit (HOSPITAL_COMMUNITY): Payer: Medicare Other

## 2022-05-23 ENCOUNTER — Ambulatory Visit: Payer: Medicare Other

## 2022-06-04 ENCOUNTER — Ambulatory Visit
Admission: RE | Admit: 2022-06-04 | Discharge: 2022-06-04 | Disposition: A | Discharge status: Home / Self Care | Payer: Medicare Other | Source: Ambulatory Visit | Attending: Student | Admitting: Student

## 2022-06-04 DIAGNOSIS — R911 Solitary pulmonary nodule: Secondary | ICD-10-CM

## 2022-06-06 ENCOUNTER — Ambulatory Visit: Payer: Medicare Other | Admitting: Vascular Surgery

## 2022-06-06 ENCOUNTER — Ambulatory Visit (HOSPITAL_COMMUNITY): Payer: Medicare Other

## 2022-06-08 NOTE — H&P (View-Only) (Signed)
Synopsis: Referred for pulmonary nodule by Burnard Bunting, MD  Subjective:   PATIENT ID: Peter Greene GENDER: male DOB: 04/10/1940, MRN: [id number]  Chief Complaint  Patient presents with   Follow-up    Pft done today    82 year old with history of laryngeal cancer, atrial fibrillation, myelodysplastic syndrome.  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   Used to follow with Dr.Thomas J. 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  He appears to have had surgical lung biopsy in the past but the patient does not recall the date of the procedure.  He is currently not on any specific treatments for interstitial lung disease and is transferring care as he moved [place] to be closer to his family.   He was hospitalized in the middle of May 2023 with sepsis, community-acquired pneumonia with CT scan showing focal consolidations from baseline vesicular.  Years.  Treated with Rocephin, Azithromycin and sent home on Augmentin with a steroid taper.  Overall he feels improved and back to baseline  Interval HPI:  Had planned to stop prednisone after last visit which he did, feels fine after making this change. Rheumatologic ROS negative.   Interval growth of RLL nodule on CT Super D 3/7  Otherwise pertinent review of systems is negative.  Past Medical History:  Diagnosis Date   AAA (abdominal aortic aneurysm) ([place])    Carotid artery occlusion    Complication of anesthesia    trouble intubating   Dysrhythmia    History of bone marrow transplant ([place])    Hyperlipidemia    Interstitial lung disease ([place])    Laryngeal cancer ([place]) 2004   Pneumonia    x 2   Thyroid disease      No family history on file.   Past Surgical  History:  Procedure Laterality Date   ABDOMINAL AORTIC ANEURYSM REPAIR     BONE MARROW TRANSPLANT     BRONCHIAL BIOPSY  04/17/2022   Procedure: BRONCHIAL BIOPSIES;  Surgeon: Maryjane Hurter, MD;  Location: [facility] ENDOSCOPY;  Service: Pulmonary;;   BRONCHIAL BRUSHINGS  04/17/2022   Procedure: BRONCHIAL BRUSHINGS;  Surgeon: Maryjane Hurter, MD;  Location: [facility] ENDOSCOPY;  Service: Pulmonary;;   BRONCHIAL NEEDLE ASPIRATION BIOPSY  04/17/2022   Procedure: BRONCHIAL NEEDLE ASPIRATION BIOPSIES;  Surgeon: Maryjane Hurter, MD;  Location: [facility] ENDOSCOPY;  Service: Pulmonary;;   BRONCHIAL WASHINGS  04/17/2022   Procedure: BRONCHIAL WASHINGS;  Surgeon: Maryjane Hurter, MD;  Location: [facility] ENDOSCOPY;  Service: Pulmonary;;   CARDIAC CATHETERIZATION     ENDARTERECTOMY Left 01/30/2022   Procedure: ENDARTERECTOMY CAROTID;  Surgeon: Broadus John, MD;  Location: [place];  Service: Vascular;  Laterality: Left;   EYE SURGERY     HERNIA REPAIR     KNEE SURGERY Bilateral    Joint replacements   MOHS SURGERY     x 2 R & L   PATCH ANGIOPLASTY Left 01/30/2022   Procedure: [place] [id number];  Surgeon: Broadus John, MD;  Location: [place];  Service: Vascular;  Laterality: Left;   [place]  04/17/2022   Procedure: VIDEO BRONCHOSCOPY WITH RADIAL ENDOBRONCHIAL ULTRASOUND;  Surgeon: Maryjane Hurter, MD;  Location: [place];  Service:  Pulmonary;;    Social History   Socioeconomic History   Marital status: Married    Spouse name: Mardene Celeste   Number of children: 1   Years of education: Not on file   Highest education level: Not on file  Occupational History   Not on file  Tobacco Use   Smoking status: Former    Types: Cigarettes    Quit date: 1984    Years since quitting: 40.2    Passive exposure: Never   Smokeless tobacco: Never  Vaping Use   Vaping Use: Never used  Substance and Sexual  Activity   Alcohol use: Yes    Comment: occasionally   Drug use: Never   Sexual activity: Not Currently  Other Topics Concern   Not on file  Social History Narrative   Not on file   Social Determinants of Health   Financial Resource Strain: Not on file  Food Insecurity: Not on file  Transportation Needs: Not on file  Physical Activity: Not on file  Stress: Not on file  Social Connections: Not on file  Intimate Partner Violence: Not on file     Allergies  Allergen Reactions   Other Other (See Comments)    Patient states he difficult to intubate     Outpatient Medications Prior to Visit  Medication Sig Dispense Refill   acetaminophen (TYLENOL) 500 MG tablet Take 500 mg by mouth every 6 (six) hours as needed for moderate pain.     aspirin EC 81 MG tablet Take 81 mg by mouth in the morning. Swallow whole.     cycloSPORINE (RESTASIS) 0.05 % ophthalmic emulsion Place 1 drop into both eyes 2 (two) times daily as needed (dry/irritated eyes.).     ELIQUIS 5 MG TABS tablet Take 5 mg by mouth 2 (two) times daily.     ipratropium (ATROVENT) 0.03 % nasal spray Place 2 sprays into both nostrils 2 (two) times daily as needed for rhinitis.     Krill Oil 350 MG CAPS Take 350 mg by mouth every evening.     levothyroxine (SYNTHROID) 25 MCG tablet Take 25 mcg by mouth every evening.     Melatonin 10 MG TABS Take by mouth at bedtime.     metoprolol tartrate (LOPRESSOR) 25 MG tablet Take 25 mg by mouth 2 (two) times daily.     Multiple Vitamin (MULTIVITAMIN WITH MINERALS) TABS tablet Take 1 tablet by mouth in the morning. Centrum     neomycin-polymyxin b-dexamethasone (MAXITROL) 3.5-10000-0.1 SUSP Place 1 drop into both eyes every evening.     oxybutynin (DITROPAN-XL) 10 MG 24 hr tablet Take 10 mg by mouth every evening.     pantoprazole (PROTONIX) 20 MG tablet Take 20 mg by mouth every evening.     Propylene Glycol (SYSTANE BALANCE OP) Place 1 drop into both eyes 3 (three) times daily as needed  (dry/irritated eyes.).     tamsulosin (FLOMAX) 0.4 MG CAPS capsule Take 0.4 mg by mouth in the morning.     valACYclovir (VALTREX) 500 MG tablet Take 500 mg by mouth in the morning.     atorvastatin (LIPITOR) 40 MG tablet Take 40 mg by mouth in the morning. (Patient not taking: Reported on 06/11/2022)     gabapentin (NEURONTIN) 100 MG capsule Take 200 mg by mouth 2 (two) times daily. (Patient not taking: Reported on 06/11/2022)     gabapentin (NEURONTIN) 300 MG capsule Take 300 mg by mouth 2 (two) times daily as needed (neuropathy). (Patient not taking:  Reported on 06/11/2022)     HYDROcodone-acetaminophen (NORCO) 5-325 MG tablet Take 1 tablet by mouth every 6 (six) hours as needed for moderate pain. (Patient not taking: Reported on 06/11/2022) 8 tablet 0   predniSONE (DELTASONE) 1 MG tablet Take 1 mg by mouth every other day. In the morning. (Patient not taking: Reported on 06/11/2022)     No facility-administered medications prior to visit.       Objective:   Physical Exam:  General appearance: 82 y.o., male, NAD, conversant  Eyes: anicteric sclerae; PERRL, tracking appropriately HENT: NCAT; MMM Neck: Trachea midline; no lymphadenopathy, no JVD Lungs: +crackles in bases, with normal respiratory effort CV: RRR, no murmur  Abdomen: Soft, non-tender; non-distended, BS present  Extremities: No peripheral edema, warm Skin: Normal turgor and texture; no rash Psych: Appropriate affect Neuro: Alert and oriented to person and place, no focal deficit     Vitals:   06/11/22 1100  BP: 118/62  Pulse: (!) 58  SpO2: 95%  Weight: 201 lb (91.2 kg)  Height: 5\' 8"  (1.727 m)    95% on  RA BMI Readings from Last 3 Encounters:  06/11/22 30.56 kg/m  04/25/22 29.65 kg/m  04/17/22 29.65 kg/m   Wt Readings from Last 3 Encounters:  06/11/22 201 lb (91.2 kg)  04/25/22 195 lb (88.5 kg)  04/17/22 195 lb (88.5 kg)     CBC    Component Value Date/Time   WBC 5.6 04/17/2022 0804   RBC 3.64  (L) 04/17/2022 0804   HGB 11.8 (L) 04/17/2022 0804   HCT 36.7 (L) 04/17/2022 0804   PLT 189 04/17/2022 0804   MCV 100.8 (H) 04/17/2022 0804   MCH 32.4 04/17/2022 0804   MCHC 32.2 04/17/2022 0804   RDW 16.4 (H) 04/17/2022 0804   LYMPHSABS 1.4 08/14/2021 0443   MONOABS 0.6 08/14/2021 0443   EOSABS 0.0 08/14/2021 0443   BASOSABS 0.0 08/14/2021 0443     Chest Imaging: CXR post bronch without PTX   CT Super D Chest 06/05/22 with interval enlargement of spiculated RLL nodule  Pulmonary Functions Testing Results:    Latest Ref Rng & Units 06/11/2022    9:58 AM 01/01/2022    3:53 PM  PFT Results  FVC-Pre L 2.76  P 2.47   FVC-Predicted Pre % 73  P 65   FVC-Post L 2.75  P 2.54   FVC-Predicted Post % 73  P 67   Pre FEV1/FVC % % 83  P 85   Post FEV1/FCV % % 85  P 86   FEV1-Pre L 2.30  P 2.09   FEV1-Predicted Pre % 86  P 77   FEV1-Post L 2.35  P 2.19   DLCO uncorrected ml/min/mmHg 13.62  P 13.02   DLCO UNC% % 59  P 56   DLCO corrected ml/min/mmHg 13.62  P 14.71   DLCO COR %Predicted % 59  P 63   DLVA Predicted % 87  P 94   TLC L 5.13  P 4.16   TLC % Predicted % 77  P 62   RV % Predicted % 91  P 63     P Preliminary result  Mild restriction, moderately reduced diffusing capacity       Assessment & Plan:   # RLL pulmonary nodule  # ILD # Restrictive ventilatory defect with low ERV but also moderately reduced diffusing capacity Reportedly treated in past for post engraftment idiopathic pneumonia syndrome with prolonged steroid course. Air trapping on CT Chest with foci of  honeycomb change raise question of HP though, CTD-ILD post BMT an alternative possibility. Doesn't appear progressive on CT, PFT though off of prednisone.  Plan: - repeat nav 06/19/22 under general - needs to stop eliquis 3 days prior, aspirin 5 days prior - CTD serologies, HP panel today, complete ILD packet - PFT in a year, frequency of surveillance CT Chest (for both nodule, ILD) tbd pending  biopsy  Follow up tbd pending biopsy  Maryjane Hurter, MD [place] [facility] 06/11/2022 11:11 AM

## 2022-06-08 NOTE — Progress Notes (Unsigned)
Synopsis: Referred for pulmonary nodule by Burnard Bunting, MD  Subjective:   PATIENT ID: Peter Greene GENDER: male DOB: 06-29-1940, MRN: [id number]  No chief complaint on file.  82 year old with history of laryngeal cancer, atrial fibrillation, myelodysplastic syndrome.  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   Used to follow with Dr.Thomas J. 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  He appears to have had surgical lung biopsy in the past but the patient does not recall the date of the procedure.  He is currently not on any specific treatments for interstitial lung disease and is transferring care as he moved [place] to be closer to his family.   He was hospitalized in the middle of May 2023 with sepsis, community-acquired pneumonia with CT scan showing focal consolidations from baseline vesicular.  Years.  Treated with Rocephin, Azithromycin and sent home on Augmentin with a steroid taper.  Overall he feels improved and back to baseline  Interval HPI:  Cold trip to [place] unfortunately. Too cold even for golf. No CP, change in dyspnea. Still has occaisonal cough with small clot of blood.   No change in DOE since last visit. Remains on prednisone 1 mg every other day.  ------------------------------- Had planned to stop prednisone after last visit.   Interval growth of RLL nodule on CT Super D 3/7  Otherwise pertinent review of systems is negative.  Past Medical History:  Diagnosis Date   AAA (abdominal aortic aneurysm) ([place])    Carotid artery occlusion    Complication of anesthesia    trouble intubating   Dysrhythmia    History of bone marrow transplant ([place])    Hyperlipidemia    Interstitial lung disease  ([place])    Laryngeal cancer ([place]) 2004   Pneumonia    x 2   Thyroid disease      No family history on file.   Past Surgical History:  Procedure Laterality Date   ABDOMINAL AORTIC ANEURYSM REPAIR     BONE MARROW TRANSPLANT     BRONCHIAL BIOPSY  04/17/2022   Procedure: BRONCHIAL BIOPSIES;  Surgeon: Maryjane Hurter, MD;  Location: [facility] ENDOSCOPY;  Service: Pulmonary;;   BRONCHIAL BRUSHINGS  04/17/2022   Procedure: BRONCHIAL BRUSHINGS;  Surgeon: Maryjane Hurter, MD;  Location: [facility] ENDOSCOPY;  Service: Pulmonary;;   BRONCHIAL NEEDLE ASPIRATION BIOPSY  04/17/2022   Procedure: BRONCHIAL NEEDLE ASPIRATION BIOPSIES;  Surgeon: Maryjane Hurter, MD;  Location: [facility] ENDOSCOPY;  Service: Pulmonary;;   BRONCHIAL WASHINGS  04/17/2022   Procedure: BRONCHIAL WASHINGS;  Surgeon: Maryjane Hurter, MD;  Location: [facility] ENDOSCOPY;  Service: Pulmonary;;   CARDIAC CATHETERIZATION     ENDARTERECTOMY Left 01/30/2022   Procedure: ENDARTERECTOMY CAROTID;  Surgeon: Broadus John, MD;  Location: [place];  Service: Vascular;  Laterality: Left;   EYE SURGERY     HERNIA REPAIR     KNEE SURGERY Bilateral    Joint replacements   MOHS SURGERY     x 2 R & L   PATCH ANGIOPLASTY Left 01/30/2022   Procedure: [place] [id number];  Surgeon: Broadus John, MD;  Location: [place];  Service: Vascular;  Laterality: Left;   [place]  04/17/2022  Procedure: VIDEO BRONCHOSCOPY WITH RADIAL ENDOBRONCHIAL ULTRASOUND;  Surgeon: Maryjane Hurter, MD;  Location: [facility] ENDOSCOPY;  Service: Pulmonary;;    Social History   Socioeconomic History   Marital status: Married    Spouse name: Mardene Celeste   Number of children: 1   Years of education: Not on file   Highest education level: Not on file  Occupational History   Not on file  Tobacco Use   Smoking status: Former    Types: Cigarettes    Quit date: 1984    Years since  quitting: 40.2    Passive exposure: Never   Smokeless tobacco: Never  Vaping Use   Vaping Use: Never used  Substance and Sexual Activity   Alcohol use: Yes    Comment: occasionally   Drug use: Never   Sexual activity: Not Currently  Other Topics Concern   Not on file  Social History Narrative   Not on file   Social Determinants of Health   Financial Resource Strain: Not on file  Food Insecurity: Not on file  Transportation Needs: Not on file  Physical Activity: Not on file  Stress: Not on file  Social Connections: Not on file  Intimate Partner Violence: Not on file     Allergies  Allergen Reactions   Other Other (See Comments)    Patient states he difficult to intubate     Outpatient Medications Prior to Visit  Medication Sig Dispense Refill   acetaminophen (TYLENOL) 500 MG tablet Take 500 mg by mouth every 6 (six) hours as needed for moderate pain.     aspirin EC 81 MG tablet Take 81 mg by mouth in the morning. Swallow whole.     atorvastatin (LIPITOR) 40 MG tablet Take 40 mg by mouth in the morning.     cycloSPORINE (RESTASIS) 0.05 % ophthalmic emulsion Place 1 drop into both eyes 2 (two) times daily as needed (dry/irritated eyes.).     ELIQUIS 5 MG TABS tablet Take 5 mg by mouth 2 (two) times daily.     gabapentin (NEURONTIN) 100 MG capsule Take 200 mg by mouth 2 (two) times daily.     gabapentin (NEURONTIN) 300 MG capsule Take 300 mg by mouth 2 (two) times daily as needed (neuropathy).     HYDROcodone-acetaminophen (NORCO) 5-325 MG tablet Take 1 tablet by mouth every 6 (six) hours as needed for moderate pain. (Patient not taking: Reported on 02/17/2022) 8 tablet 0   ipratropium (ATROVENT) 0.03 % nasal spray Place 2 sprays into both nostrils 2 (two) times daily as needed for rhinitis.     Krill Oil 350 MG CAPS Take 350 mg by mouth every evening.     levothyroxine (SYNTHROID) 25 MCG tablet Take 25 mcg by mouth every evening.     Melatonin 10 MG TABS Take by mouth at  bedtime.     metoprolol tartrate (LOPRESSOR) 25 MG tablet Take 25 mg by mouth 2 (two) times daily.     Multiple Vitamin (MULTIVITAMIN WITH MINERALS) TABS tablet Take 1 tablet by mouth in the morning. Centrum     neomycin-polymyxin b-dexamethasone (MAXITROL) 3.5-10000-0.1 SUSP Place 1 drop into both eyes every evening.     oxybutynin (DITROPAN-XL) 10 MG 24 hr tablet Take 10 mg by mouth every evening.     pantoprazole (PROTONIX) 20 MG tablet Take 20 mg by mouth every evening.     predniSONE (DELTASONE) 1 MG tablet Take 1 mg by mouth every other day. In the morning.  Propylene Glycol (SYSTANE BALANCE OP) Place 1 drop into both eyes 3 (three) times daily as needed (dry/irritated eyes.).     tamsulosin (FLOMAX) 0.4 MG CAPS capsule Take 0.4 mg by mouth in the morning.     valACYclovir (VALTREX) 500 MG tablet Take 500 mg by mouth in the morning.     No facility-administered medications prior to visit.       Objective:   Physical Exam:  General appearance: 82 y.o., male, NAD, conversant  Eyes: anicteric sclerae; PERRL, tracking appropriately HENT: NCAT; MMM Neck: Trachea midline; no lymphadenopathy, no JVD Lungs: +crackles in bases, with normal respiratory effort CV: RRR, no murmur  Abdomen: Soft, non-tender; non-distended, BS present  Extremities: No peripheral edema, warm Skin: Normal turgor and texture; no rash Psych: Appropriate affect Neuro: Alert and oriented to person and place, no focal deficit     There were no vitals filed for this visit.    on  RA BMI Readings from Last 3 Encounters:  04/25/22 29.65 kg/m  04/17/22 29.65 kg/m  02/17/22 30.41 kg/m   Wt Readings from Last 3 Encounters:  04/25/22 195 lb (88.5 kg)  04/17/22 195 lb (88.5 kg)  02/17/22 200 lb (90.7 kg)     CBC    Component Value Date/Time   WBC 5.6 04/17/2022 0804   RBC 3.64 (L) 04/17/2022 0804   HGB 11.8 (L) 04/17/2022 0804   HCT 36.7 (L) 04/17/2022 0804   PLT 189 04/17/2022 0804   MCV  100.8 (H) 04/17/2022 0804   MCH 32.4 04/17/2022 0804   MCHC 32.2 04/17/2022 0804   RDW 16.4 (H) 04/17/2022 0804   LYMPHSABS 1.4 08/14/2021 0443   MONOABS 0.6 08/14/2021 0443   EOSABS 0.0 08/14/2021 0443   BASOSABS 0.0 08/14/2021 0443     Chest Imaging: CXR post bronch without PTX   CT Super D Chest 06/05/22 with interval enlargement of spiculated RLL nodule  Pulmonary Functions Testing Results:    Latest Ref Rng & Units 01/01/2022    3:53 PM  PFT Results  FVC-Pre L 2.47   FVC-Predicted Pre % 65   FVC-Post L 2.54   FVC-Predicted Post % 67   Pre FEV1/FVC % % 85   Post FEV1/FCV % % 86   FEV1-Pre L 2.09   FEV1-Predicted Pre % 77   FEV1-Post L 2.19   DLCO uncorrected ml/min/mmHg 13.02   DLCO UNC% % 56   DLCO corrected ml/min/mmHg 14.71   DLCO COR %Predicted % 63   DLVA Predicted % 94   TLC L 4.16   TLC % Predicted % 62   RV % Predicted % 63   Mild restriction, moderately reduced diffusing capacity       Assessment & Plan:   # RLL pulmonary nodule  # ILD # Restrictive ventilatory defect with low ERV but also moderately reduced diffusing capacity Reportedly treated in past for post engraftment idiopathic pneumonia syndrome with prolonged steroid course. Air trapping on CT Chest with foci of honeycomb change raise question of HP though, CTD-ILD post BMT an alternative possibility.  Plan: - CT Chest in early March with clinic visit afterward to review. If interval growth then repeat nav with Dr. Valeta Harms or Dr. Lamonte Sakai - would stop prednisone but call our clinic [phone] if cough/worsening trouble breathing after stopping - breathing tests we will schedule for same day as next clinic visit in March, if progression consideration of restarting prednisone vs antifibrotic - discuss obtaining HP panel, CTD serologies next visit  Maryjane Hurter, MD [place] [facility] 06/08/2022 3:19 PM

## 2022-06-11 ENCOUNTER — Ambulatory Visit (INDEPENDENT_AMBULATORY_CARE_PROVIDER_SITE_OTHER): Payer: Medicare Other | Admitting: Student

## 2022-06-11 ENCOUNTER — Encounter: Payer: Self-pay | Admitting: Student

## 2022-06-11 ENCOUNTER — Telehealth: Payer: Self-pay | Admitting: Student

## 2022-06-11 DIAGNOSIS — J849 Interstitial pulmonary disease, unspecified: Secondary | ICD-10-CM | POA: Diagnosis not present

## 2022-06-11 DIAGNOSIS — R911 Solitary pulmonary nodule: Secondary | ICD-10-CM | POA: Diagnosis not present

## 2022-06-11 LAB — PULMONARY FUNCTION TEST
DL/VA % pred: 87 %
DL/VA: 3.44 ml/min/mmHg/L
DLCO cor % pred: 59 %
DLCO cor: 13.62 ml/min/mmHg
DLCO unc % pred: 59 %
DLCO unc: 13.62 ml/min/mmHg
FEF 25-75 Post: 2.86 L/sec
FEF 25-75 Pre: 2.62 L/sec
FEF2575-%Change-Post: 8 %
FEF2575-%Pred-Post: 155 %
FEF2575-%Pred-Pre: 142 %
FEV1-%Change-Post: 2 %
FEV1-%Pred-Post: 88 %
FEV1-%Pred-Pre: 86 %
FEV1-Post: 2.35 L
FEV1-Pre: 2.3 L
FEV1FVC-%Change-Post: 2 %
FEV1FVC-%Pred-Pre: 116 %
FEV6-%Change-Post: 0 %
FEV6-%Pred-Post: 79 %
FEV6-%Pred-Pre: 78 %
FEV6-Post: 2.75 L
FEV6-Pre: 2.73 L
FEV6FVC-%Change-Post: 0 %
FEV6FVC-%Pred-Post: 107 %
FEV6FVC-%Pred-Pre: 106 %
FVC-%Change-Post: 0 %
FVC-%Pred-Post: 73 %
FVC-%Pred-Pre: 73 %
FVC-Post: 2.75 L
FVC-Pre: 2.76 L
Post FEV1/FVC ratio: 85 %
Post FEV6/FVC ratio: 100 %
Pre FEV1/FVC ratio: 83 %
Pre FEV6/FVC Ratio: 99 %
RV % pred: 91 %
RV: 2.31 L
TLC % pred: 77 %
TLC: 5.13 L

## 2022-06-11 LAB — CK: Total CK: 518 U/L — ABNORMAL HIGH (ref 7–232)

## 2022-06-11 NOTE — Patient Instructions (Signed)
Full PFT performed today. °

## 2022-06-11 NOTE — Progress Notes (Signed)
Full PFT performed today. °

## 2022-06-14 LAB — CYCLIC CITRUL PEPTIDE ANTIBODY, IGG: Cyclic Citrullin Peptide Ab: <16 UNITS

## 2022-06-14 LAB — RHEUMATOID FACTOR: Rheumatoid fact SerPl-aCnc: 16 IU/mL — ABNORMAL HIGH (ref <14)

## 2022-06-14 LAB — ANA W/REFLEX: ANA Titer 1: NEGATIVE

## 2022-06-17 ENCOUNTER — Encounter (HOSPITAL_COMMUNITY): Payer: Self-pay | Admitting: Student

## 2022-06-17 ENCOUNTER — Other Ambulatory Visit: Payer: Self-pay

## 2022-06-17 ENCOUNTER — Other Ambulatory Visit: Payer: Medicare Other

## 2022-06-17 DIAGNOSIS — R911 Solitary pulmonary nodule: Secondary | ICD-10-CM

## 2022-06-17 LAB — HYPERSENSITIVITY PNEUMONITIS
A. Pullulans Abs: NEGATIVE
A.Fumigatus #1 Abs: NEGATIVE
Micropolyspora faeni, IgG: NEGATIVE
Pigeon Serum Abs: NEGATIVE
Thermoact. Saccharii: NEGATIVE
Thermoactinomyces vulgaris, IgG: NEGATIVE

## 2022-06-18 ENCOUNTER — Encounter (HOSPITAL_COMMUNITY): Payer: Self-pay | Admitting: Student

## 2022-06-18 NOTE — Progress Notes (Signed)
PCP - Dr Aggie Hacker Cardiologist - Dr Orlena Sheldon Neurology - Tomi Bamberger, NP Oncology - Dr Warren Lacy Dezern Pulmonology - Dr Leslye Peer  CT Chest x-ray - 06/04/22  EKG - 04/17/22  Stress Test - 06/11/15 ECHO - 08/12/21 Cardiac Cath - n/a  ICD Pacemaker/Loop - n/a  Sleep Study -  n/a CPAP - none  Diabetes - n/a  Blood Thinner Instructions:  Stop Eliquis 3 days prior procedure.  Last dose was on 06/15/22.  Aspirin Instructions: Stop ASA 5 days prior procedure.  Last dose was on 06/14/22.  NPO  Anesthesia review: Yes  STOP now taking any Aspirin (unless otherwise instructed by your surgeon), Aleve, Naproxen, Ibuprofen, Motrin, Advil, Goody's, BC's, all herbal medications, fish oil, and all vitamins.   Coronavirus Screening Do you have any of the following symptoms:  Cough yes/no: No Fever (>100.104F)  yes/no: No Runny nose yes/no: No Sore throat yes/no: No Difficulty breathing/shortness of breath  yes/no: No  Have you traveled in the last 14 days and where? yes/no: No  Patient verbalized understanding of instructions that were given via phone.

## 2022-06-18 NOTE — Anesthesia Preprocedure Evaluation (Addendum)
Anesthesia Evaluation  Patient identified by MRN, date of birth, ID band Patient awake    Reviewed: Allergy & Precautions, NPO status , Patient's Chart, lab work & pertinent test results  History of Anesthesia Complications (+) DIFFICULT AIRWAY and history of anesthetic complications  Airway Mallampati: II  TM Distance: <3 FB Neck ROM: Limited    Dental no notable dental hx.    Pulmonary former smoker Interstitial lung dz   Pulmonary exam normal breath sounds clear to auscultation       Cardiovascular hypertension, Pt. on medications and Pt. on home beta blockers + Peripheral Vascular Disease  Normal cardiovascular exam+ dysrhythmias  Rhythm:Regular Rate:Normal  07/2021 Echo 1. Left ventricular ejection fraction, by estimation, is 50 to 55%. The  left ventricle has low normal function. The left ventricle has no regional  wall motion abnormalities. There is mild concentric left ventricular  hypertrophy. Left ventricular  diastolic parameters are indeterminate.   2. Right ventricular systolic function is normal. The right ventricular  size is normal. There is normal pulmonary artery systolic pressure.   3. Left atrial size was mildly dilated.   4. The pericardial effusion is circumferential.   5. The mitral valve is normal in structure. No evidence of mitral valve  regurgitation. No evidence of mitral stenosis.   6. The aortic valve is normal in structure. Aortic valve regurgitation is  not visualized. Aortic valve sclerosis is present, with no evidence of  aortic valve stenosis.   7. Aneurysm of the ascending aorta, measuring 45 mm. There is moderate  dilatation of the aortic root, measuring 40 mm.   8. The inferior vena cava is normal in size with greater than 50%  respiratory variability, suggesting right atrial pressure of 3 mmHg.      Neuro/Psych    GI/Hepatic ,GERD  ,,  Endo/Other  Hypothyroidism    Renal/GU Lab  Results      Component                Value               Date                      CREATININE               1.32 (H)            06/19/2022                  K                        4.0                 06/19/2022                     Musculoskeletal   Abdominal   Peds  Hematology  (+) Blood dyscrasia Lab Results      Component                Value               Date                      WBC                      6.9  06/19/2022                HGB                      11.8 (L)            06/19/2022                HCT                      37.3 (L)            06/19/2022                MCV                      100.0               06/19/2022                PLT                      207                 06/19/2022               Pt on eliquis   Anesthesia Other Findings Hx of laryngeal CA  Reproductive/Obstetrics                             Anesthesia Physical Anesthesia Plan  ASA: 3  Anesthesia Plan: General   Post-op Pain Management:    Induction: Intravenous  PONV Risk Score and Plan: 2 and Treatment may vary due to age or medical condition and Ondansetron  Airway Management Planned: Video Laryngoscope Planned and Oral ETT  Additional Equipment: None  Intra-op Plan:   Post-operative Plan: Extubation in OR  Informed Consent: I have reviewed the patients History and Physical, chart, labs and discussed the procedure including the risks, benefits and alternatives for the proposed anesthesia with the patient or authorized representative who has indicated his/her understanding and acceptance.     Dental advisory given  Plan Discussed with:   Anesthesia Plan Comments: (See recent PAT note by Karoline Caldwell, PA-C 04/16/22.  )        Anesthesia Quick Evaluation

## 2022-06-19 ENCOUNTER — Ambulatory Visit (HOSPITAL_COMMUNITY): Payer: Medicare Other | Admitting: Physician Assistant

## 2022-06-19 ENCOUNTER — Ambulatory Visit (HOSPITAL_COMMUNITY): Payer: Medicare Other

## 2022-06-19 ENCOUNTER — Encounter (HOSPITAL_COMMUNITY)
Admission: RE | Disposition: A | Discharge status: Home / Self Care | Payer: Self-pay | Source: Home / Self Care | Attending: Student

## 2022-06-19 ENCOUNTER — Ambulatory Visit (HOSPITAL_COMMUNITY)
Admission: RE | Admit: 2022-06-19 | Discharge: 2022-06-19 | Disposition: A | Discharge status: Home / Self Care | Payer: Medicare Other | Attending: Student | Admitting: Student

## 2022-06-19 ENCOUNTER — Ambulatory Visit (HOSPITAL_BASED_OUTPATIENT_CLINIC_OR_DEPARTMENT_OTHER): Payer: Medicare Other | Admitting: Physician Assistant

## 2022-06-19 ENCOUNTER — Encounter (HOSPITAL_COMMUNITY): Payer: Self-pay | Admitting: Student

## 2022-06-19 DIAGNOSIS — Z87891 Personal history of nicotine dependence: Secondary | ICD-10-CM | POA: Diagnosis not present

## 2022-06-19 DIAGNOSIS — D469 Myelodysplastic syndrome, unspecified: Secondary | ICD-10-CM | POA: Diagnosis not present

## 2022-06-19 DIAGNOSIS — I10 Essential (primary) hypertension: Secondary | ICD-10-CM

## 2022-06-19 DIAGNOSIS — R911 Solitary pulmonary nodule: Secondary | ICD-10-CM

## 2022-06-19 DIAGNOSIS — I739 Peripheral vascular disease, unspecified: Secondary | ICD-10-CM | POA: Diagnosis not present

## 2022-06-19 DIAGNOSIS — Z09 Encounter for follow-up examination after completed treatment for conditions other than malignant neoplasm: Secondary | ICD-10-CM | POA: Diagnosis not present

## 2022-06-19 DIAGNOSIS — Z8521 Personal history of malignant neoplasm of larynx: Secondary | ICD-10-CM | POA: Insufficient documentation

## 2022-06-19 DIAGNOSIS — I4891 Unspecified atrial fibrillation: Secondary | ICD-10-CM | POA: Insufficient documentation

## 2022-06-19 DIAGNOSIS — E039 Hypothyroidism, unspecified: Secondary | ICD-10-CM | POA: Insufficient documentation

## 2022-06-19 DIAGNOSIS — Z08 Encounter for follow-up examination after completed treatment for malignant neoplasm: Secondary | ICD-10-CM | POA: Diagnosis not present

## 2022-06-19 HISTORY — PX: FIDUCIAL MARKER PLACEMENT: SHX6858

## 2022-06-19 HISTORY — PX: VIDEO BRONCHOSCOPY WITH RADIAL ENDOBRONCHIAL ULTRASOUND: SHX6849

## 2022-06-19 HISTORY — DX: Anemia, unspecified: D64.9

## 2022-06-19 HISTORY — DX: Gastro-esophageal reflux disease without esophagitis: K21.9

## 2022-06-19 HISTORY — DX: Malignant neoplasm of lymphoid, hematopoietic and related tissue, unspecified: C96.9

## 2022-06-19 HISTORY — DX: Unspecified malignant neoplasm of skin, unspecified: C44.90

## 2022-06-19 HISTORY — DX: Hypothyroidism, unspecified: E03.9

## 2022-06-19 HISTORY — PX: BRONCHIAL BIOPSY: SHX5109

## 2022-06-19 HISTORY — PX: BRONCHIAL NEEDLE ASPIRATION BIOPSY: SHX5106

## 2022-06-19 HISTORY — DX: Myoneural disorder, unspecified: G70.9

## 2022-06-19 HISTORY — PX: BRONCHIAL WASHINGS: SHX5105

## 2022-06-19 LAB — BASIC METABOLIC PANEL
Anion gap: 11 (ref 5–15)
BUN: 23 mg/dL (ref 8–23)
CO2: 25 mmol/L (ref 22–32)
Calcium: 9.3 mg/dL (ref 8.9–10.3)
Chloride: 103 mmol/L (ref 98–111)
Creatinine, Ser: 1.32 mg/dL — ABNORMAL HIGH (ref 0.61–1.24)
GFR, Estimated: 55 mL/min — ABNORMAL LOW (ref >60)
Glucose, Bld: 89 mg/dL (ref 70–99)
Potassium: 4 mmol/L (ref 3.5–5.1)
Sodium: 139 mmol/L (ref 135–145)

## 2022-06-19 LAB — CBC
HCT: 37.3 % — ABNORMAL LOW (ref 39.0–52.0)
Hemoglobin: 11.8 g/dL — ABNORMAL LOW (ref 13.0–17.0)
MCH: 31.6 pg (ref 26.0–34.0)
MCHC: 31.6 g/dL (ref 30.0–36.0)
MCV: 100 fL (ref 80.0–100.0)
Platelets: 207 10*3/uL (ref 150–400)
RBC: 3.73 MIL/uL — ABNORMAL LOW (ref 4.22–5.81)
RDW: 16.7 % — ABNORMAL HIGH (ref 11.5–15.5)
WBC: 6.9 10*3/uL (ref 4.0–10.5)
nRBC: 0 % (ref 0.0–0.2)

## 2022-06-19 LAB — SPECIMEN STATUS REPORT

## 2022-06-19 LAB — NOVEL CORONAVIRUS, NAA: SARS-CoV-2, NAA: NOT DETECTED

## 2022-06-19 SURGERY — ROBOTIC ASSISTED NAVIGATIONAL BRONCHOSCOPY
Anesthesia: General | Laterality: Right

## 2022-06-19 MED ORDER — LACTATED RINGERS IV SOLN: INTRAVENOUS | Status: DC

## 2022-06-19 MED ORDER — CHLORHEXIDINE GLUCONATE 0.12 % MT SOLN
OROMUCOSAL | Status: AC
  Administered 2022-06-19: 15 mL via OROMUCOSAL
  Filled 2022-06-19: qty 15

## 2022-06-19 MED ORDER — CHLORHEXIDINE GLUCONATE 0.12 % MT SOLN: 15.0000 mL | Freq: Once | OROMUCOSAL | Status: AC

## 2022-06-19 MED ORDER — SUCCINYLCHOLINE CHLORIDE 200 MG/10ML IV SOSY
PREFILLED_SYRINGE | INTRAVENOUS | Status: DC | PRN
  Administered 2022-06-19: 100 mg via INTRAVENOUS

## 2022-06-19 MED ORDER — PHENYLEPHRINE HCL-NACL 20-0.9 MG/250ML-% IV SOLN
INTRAVENOUS | Status: DC | PRN
  Administered 2022-06-19: 25 ug/min via INTRAVENOUS
  Administered 2022-06-19: 50 ug/min via INTRAVENOUS

## 2022-06-19 MED ORDER — ONDANSETRON HCL 4 MG/2ML IJ SOLN
INTRAMUSCULAR | Status: DC | PRN
  Administered 2022-06-19: 4 mg via INTRAVENOUS

## 2022-06-19 MED ORDER — PROPOFOL 500 MG/50ML IV EMUL
INTRAVENOUS | Status: DC | PRN
  Administered 2022-06-19: 100 ug/kg/min via INTRAVENOUS

## 2022-06-19 MED ORDER — FENTANYL CITRATE (PF) 250 MCG/5ML IJ SOLN
INTRAMUSCULAR | Status: DC | PRN
  Administered 2022-06-19: 50 ug via INTRAVENOUS

## 2022-06-19 MED ORDER — SUGAMMADEX SODIUM 200 MG/2ML IV SOLN
INTRAVENOUS | Status: DC | PRN
  Administered 2022-06-19: 355 mg via INTRAVENOUS

## 2022-06-19 MED ORDER — EPHEDRINE SULFATE-NACL 50-0.9 MG/10ML-% IV SOSY
PREFILLED_SYRINGE | INTRAVENOUS | Status: DC | PRN
  Administered 2022-06-19: 10 mg via INTRAVENOUS
  Administered 2022-06-19: 5 mg via INTRAVENOUS
  Administered 2022-06-19: 10 mg via INTRAVENOUS

## 2022-06-19 MED ORDER — LIDOCAINE 2% (20 MG/ML) 5 ML SYRINGE
INTRAMUSCULAR | Status: DC | PRN
  Administered 2022-06-19: 80 mg via INTRAVENOUS

## 2022-06-19 MED ORDER — PROPOFOL 10 MG/ML IV BOLUS
INTRAVENOUS | Status: DC | PRN
  Administered 2022-06-19: 130 mg via INTRAVENOUS

## 2022-06-19 MED ORDER — ROCURONIUM BROMIDE 10 MG/ML (PF) SYRINGE
PREFILLED_SYRINGE | INTRAVENOUS | Status: DC | PRN
  Administered 2022-06-19: 10 mg via INTRAVENOUS
  Administered 2022-06-19: 60 mg via INTRAVENOUS

## 2022-06-19 SURGICAL SUPPLY — 1 items: SupoerLock fiducial ×2 IMPLANT

## 2022-06-19 NOTE — Anesthesia Procedure Notes (Signed)
Procedure Name: Intubation Date/Time: 06/19/2022 7:44 AM  Performed by: Leonor Liv, CRNAPre-anesthesia Checklist: Patient identified, Emergency Drugs available, Suction available and Patient being monitored Patient Re-evaluated:Patient Re-evaluated prior to induction Oxygen Delivery Method: Circle System Utilized Preoxygenation: Pre-oxygenation with 100% oxygen Induction Type: IV induction Ventilation: Mask ventilation without difficulty and Oral airway inserted - appropriate to patient size Laryngoscope Size: Glidescope and 4 Grade View: Grade II Tube type: Oral Tube size: 8.5 mm Number of attempts: 1 Airway Equipment and Method: Oral airway, Rigid stylet and Video-laryngoscopy Placement Confirmation: ETT inserted through vocal cords under direct vision, positive ETCO2 and breath sounds checked- equal and bilateral Secured at: 23 cm Tube secured with: Tape Dental Injury: Teeth and Oropharynx as per pre-operative assessment

## 2022-06-19 NOTE — Discharge Instructions (Signed)
-   Normal to have cough with small blood clots, bloody streaks for first 1-3 days after bronchoscopy. If coughing up clot size of your thumb or larger then call our office [phone] and if persistent, especially if you're also developing worsening shortness of breath then would plan to head to ED. - OK to resume aspirin 81 mg daily tomorrow, eliquis day after tomorrow as long as bloody cough subsiding - Fever is common in the first 1-2 days after bronchoscopy and often is simply your body's reaction to saline exposure in your lung - just take tylenol. - Will call you in 3-5 business days when results become available

## 2022-06-19 NOTE — Interval H&P Note (Signed)
History and Physical Interval Note:  06/19/2022 7:18 AM  Peter Greene  has presented today for surgery, with the diagnosis of RIGHT PULMONARY NODULE.  The various methods of treatment have been discussed with the patient and family. After consideration of risks, benefits and other options for treatment, the patient has consented to  Procedure(s): ROBOTIC ASSISTED NAVIGATIONAL BRONCHOSCOPY (Right) as a surgical intervention.  The patient's history has been reviewed, patient examined, no change in status, stable for surgery.  I have reviewed the patient's chart and labs.  Questions were answered to the patient's satisfaction.     Maryjane Hurter

## 2022-06-19 NOTE — Anesthesia Postprocedure Evaluation (Signed)
Anesthesia Post Note  Patient: Peter Greene  Procedure(s) Performed: ROBOTIC ASSISTED NAVIGATIONAL BRONCHOSCOPY (Right) BRONCHIAL NEEDLE ASPIRATION BIOPSIES BRONCHIAL BIOPSIES VIDEO BRONCHOSCOPY WITH RADIAL ENDOBRONCHIAL ULTRASOUND BRONCHIAL WASHINGS FIDUCIAL Datil PLACEMENT     Patient location during evaluation: PACU Anesthesia Type: General Level of consciousness: awake and alert Pain management: pain level controlled Vital Signs Assessment: post-procedure vital signs reviewed and stable Respiratory status: spontaneous breathing, nonlabored ventilation, respiratory function stable and patient connected to nasal cannula oxygen Cardiovascular status: blood pressure returned to baseline and stable Postop Assessment: no apparent nausea or vomiting Anesthetic complications: no  No notable events documented.  Last Vitals:  Vitals:   06/19/22 1030 06/19/22 1045  BP: (!) 90/55 (!) 87/54  Pulse: (!) 49 (!) 52  Resp: (!) 8 16  Temp:    SpO2: 92% 96%    Last Pain:  Vitals:   06/19/22 1045  PainSc: 0-No pain                 Barnet Glasgow

## 2022-06-19 NOTE — Op Note (Signed)
Video Bronchoscopy with Robotic Assisted Bronchoscopic Navigation   Date of Operation: 06/19/2022   Pre-op Diagnosis: RLL nodule   Post-op Diagnosis: RLL nodule  Surgeon: Walker Shadow  Assistants: Londell Moh  Anesthesia: General endotracheal anesthesia  Operation: Flexible video fiberoptic bronchoscopy with robotic assistance and biopsies.  Estimated Blood Loss: Minimal  Complications: None  Indications and History: Peter Greene is a 82 y.o. male with history of RLL pulmonary nodule. The risks, benefits, complications, treatment options and expected outcomes were discussed with the patient.  The possibilities of pneumothorax, pneumonia, reaction to medication, pulmonary aspiration, perforation of a viscus, bleeding, failure to diagnose a condition and creating a complication requiring transfusion or operation were discussed with the patient who freely signed the consent.    Description of Procedure: The patient was seen in the Preoperative Area, was examined and was deemed appropriate to proceed.  The patient was taken to [facility] endoscopy room 3, identified as [name] and the procedure verified as Flexible Video Fiberoptic Bronchoscopy.  A Time Out was held and the above information confirmed.   Prior to the date of the procedure a high-resolution CT scan of the chest was performed. Utilizing ION software program a virtual tracheobronchial tree was generated to allow the creation of distinct navigation pathways to the patient's parenchymal abnormalities. After being taken to the operating room general anesthesia was initiated and the patient  was orally intubated. The video fiberoptic bronchoscope was introduced via the endotracheal tube and a general inspection was performed which showed normal right and left lung anatomy, aspiration of the bilateral mainstems was completed to remove any remaining secretions. Robotic catheter inserted into patient's endotracheal tube.   Target #1  RLL pulmonary nodule: The distinct navigation pathways prepared prior to this procedure were then utilized to navigate to patient's lesion identified on CT scan. CIOS imaging was used to aid navigation and confirm ideal location for biopsy. The robotic catheter was secured into place and the vision probe was withdrawn.  Lesion location was approximated using fluoroscopy and radial endobronchial ultrasound for peripheral targeting. Under fluoroscopic guidance transbronchial needle brushings, transbronchial needle biopsies, and transbronchial forceps biopsies were performed to be sent for cytology and pathology. A fiducial marker was left in place under fluoroscopic guidance. A bronchioalveolar lavage was performed in the RLL and sent for cytology.    At the end of the procedure a general airway inspection was performed and there was no evidence of active bleeding. The bronchoscope was removed.  The patient tolerated the procedure well. There was no significant blood loss and there were no obvious complications. A post-procedural chest x-ray is pending.  Samples Target #1: 1. Transbronchial needle brushings from RLL nodule 2. Transbronchial Wang needle biopsies from RLL nodule 3. Transbronchial forceps biopsies from RLL nodule 4. Bronchoalveolar lavage from RLL nodule 5. Endobronchial biopsies from RLL nodule    Plans:  The patient will be discharged from the PACU to home when recovered from anesthesia and after chest x-ray is reviewed. We will review the cytology, pathology and microbiology results with the patient when they become available. Outpatient followup will be with me at date TBD.

## 2022-06-19 NOTE — Transfer of Care (Signed)
Immediate Anesthesia Transfer of Care Note  Patient: Peter Greene  Procedure(s) Performed: ROBOTIC ASSISTED NAVIGATIONAL BRONCHOSCOPY (Right) BRONCHIAL NEEDLE ASPIRATION BIOPSIES BRONCHIAL BIOPSIES VIDEO BRONCHOSCOPY WITH RADIAL ENDOBRONCHIAL ULTRASOUND BRONCHIAL WASHINGS FIDUCIAL MARKER PLACEMENT  Patient Location: PACU  Anesthesia Type:General  Level of Consciousness: awake, alert , and oriented  Airway & Oxygen Therapy: Patient Spontanous Breathing and Patient connected to nasal cannula oxygen  Post-op Assessment: Report given to RN and Post -op Vital signs reviewed and stable  Post vital signs: Reviewed and stable  Last Vitals:  Vitals Value Taken Time  BP 90/57 06/19/22 0922  Temp    Pulse 53 06/19/22 0927  Resp 13 06/19/22 0927  SpO2 93 % 06/19/22 0927  Vitals shown include unvalidated device data.  Last Pain:  Vitals:   06/19/22 0920  PainSc: 0-No pain         Complications: No notable events documented.

## 2022-06-20 ENCOUNTER — Encounter (HOSPITAL_COMMUNITY): Payer: Self-pay | Admitting: Student

## 2022-06-24 ENCOUNTER — Telehealth: Payer: Self-pay | Admitting: Student

## 2022-06-24 LAB — CYTOLOGY - NON PAP

## 2022-06-24 NOTE — Telephone Encounter (Signed)
Attempted to call to discuss bronchoscopy results. Left voicemail. Will discuss at an upcoming tumor board meeting either this coming Thursday or the following.   Dare

## 2022-06-26 ENCOUNTER — Telehealth: Payer: Self-pay | Admitting: Student

## 2022-06-26 NOTE — Telephone Encounter (Signed)
Dr. Verlee Monte,  Patient is returning your call regarding his bronch results.  Thank you.  ATC patient x1.  Left detailed message letting him know that I was returning his call regarding his bronchospy results and that I would send a message to Dr. Verlee Monte letting him know that he called back.  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

## 2022-06-26 NOTE — Telephone Encounter (Signed)
Called and reviewed results from [facility]. WIll discuss case at next week's tumor board meeting.

## 2022-07-03 NOTE — Progress Notes (Signed)
Office Note   Requesting Provider:  Geoffry ParadiseAronson, Richard, MD  HPI: Peter Greene is a 82 y.o. (Mar 12, 1941) male presenting in follow-up s/p left CCA endarterectomy for >95% stenosis from prior radiation 01/2022.  Peter Greene [place] to be closer to their children.  At [age], he continues to work at [employer] in [employer], [employer].  Vascular surgery history includes EVAR in 2017 for AAA. 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  On exam today, Peter Greene denied back pain, abdominal pain, chest pain.  He denies claudication, ischemic rest pain, tissue loss.  Overall he feels to be doing well -denies recent symptoms of TIA, stroke, amaurosis.  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   Pt currently taking ASA 81 mg, aotrvastatin 40 mg, and eliquis 5 mg BID. 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   Hyperlipidemia    atorvastatin   Hypothyroidism    Interstitial lung disease    Laryngeal cancer 2004   Neuromuscular disorder    bilateral feet neuropathy   Pneumonia 07/2021   x 2   Primary cancer of bone marrow    Skin cancer    Head   Thyroid disease    Thyroid nodule 2024   Right lower lobe    Procedure Laterality Date   ARTERIAL- CAROTID ANGIOGRAPHY Left 01/25/2018  Procedure: ARTERIAL- CAROTID ANGIOGRAPHY; Surgeon: Benita GutterBade, Maseer A, MD; Location: [facility];   ENDOSTENT, ABDOMINAL AORTIC ANEURYSM (EVAR) N/A 03/04/2016  Procedure: Endostent, Abdominal Aortic Aneurysm (Evar); Surgeon:  Benita GutterBade, Maseer A, MD; [facility]; Service: Vascular; Laterality: N/A;  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   Social History   Socioeconomic History   Marital status: Married    Spouse name: Elease Hashimotoatricia   Number of children: 1   Years of education: Not on file   Highest education level: Not on file  Occupational History   Not on file  Tobacco Use   Smoking status: Former    Types: Cigarettes    Quit date: 1984    Years since quitting: 40.2    Passive exposure: Never   Smokeless tobacco: Never  Vaping Use   Vaping Use: Never used  Substance and Sexual Activity   Alcohol use: Yes    Alcohol/week: 2.0 standard drinks of alcohol    Types: 2 Standard drinks or equivalent per week   Drug use: Never   Sexual activity: Not Currently  Other Topics Concern   Not on file  Social History Narrative   Not on file   Social Determinants of Health   Financial Resource Strain: Not on file  Food Insecurity: Not on file  Transportation Needs: Not on file  Physical Activity: Not on file  Stress: Not on file  Social Connections: Not on file  Intimate Partner Violence: Not on file  History reviewed. No pertinent family history.  Current Outpatient Medications  Medication Sig Dispense Refill   acetaminophen (TYLENOL) 500 MG tablet Take 500 mg by  mouth every 6 (six) hours as needed for moderate pain.     aspirin EC 81 MG tablet Take 81 mg by mouth in the morning. Swallow whole.     atorvastatin (LIPITOR) 40 MG tablet Take 40 mg by mouth in the morning.     cycloSPORINE (RESTASIS) 0.05 % ophthalmic emulsion Place 1 drop into both eyes 2 (two) times daily as needed (dry/irritated eyes.).     ELIQUIS 5 MG TABS tablet Take 5 mg by mouth 2 (two) times daily.     ipratropium (ATROVENT) 0.03 % nasal spray Place 2 sprays into both nostrils daily.     Krill Oil 350 MG CAPS Take 350 mg by mouth every evening.     levothyroxine (SYNTHROID) 25 MCG tablet Take 25 mcg by  mouth every evening.     Melatonin 10 MG TABS Take by mouth daily as needed (sleep).     metoprolol tartrate (LOPRESSOR) 25 MG tablet Take 25 mg by mouth 2 (two) times daily.     MIEBO 1.338 GM/ML SOLN Place 1 drop into both eyes daily.     Multiple Vitamin (MULTIVITAMIN WITH MINERALS) TABS tablet Take 1 tablet by mouth in the morning. Centrum     neomycin-polymyxin b-dexamethasone (MAXITROL) 3.5-10000-0.1 OINT Place 1 drop into both eyes every evening.     pantoprazole (PROTONIX) 20 MG tablet Take 20 mg by mouth every evening.     solifenacin (VESICARE) 5 MG tablet Take 5 mg by mouth daily.     tamsulosin (FLOMAX) 0.4 MG CAPS capsule Take 0.4 mg by mouth in the morning.     valACYclovir (VALTREX) 500 MG tablet Take 500 mg by mouth in the morning.     No current facility-administered medications for this visit.    No Known Allergies   REVIEW OF SYSTEMS:   denotes positive finding,  denotes negative finding Cardiac  Comments:  Chest pain or chest pressure:    Shortness of breath upon exertion:    Short of breath when lying flat:    Irregular heart rhythm:        Vascular    Pain in calf, thigh, or hip brought on by ambulation:    Pain in feet at night that wakes you up from your sleep:     Blood clot in your veins:    Leg swelling:         Pulmonary    Oxygen at home:    Productive cough:     Wheezing:         Neurologic    Sudden weakness in arms or legs:     Sudden numbness in arms or legs:     Sudden onset of difficulty speaking or slurred speech:    Temporary loss of vision in one eye:     Problems with dizziness:         Gastrointestinal    Blood in stool:     Vomited blood:         Genitourinary    Burning when urinating:     Blood in urine:        Psychiatric    Major depression:         Hematologic    Bleeding problems:    Problems with blood clotting too easily:        Skin    Rashes or ulcers:        Constitutional    Fever or chills:       PHYSICAL  EXAMINATION:  Vitals:   07/04/22 1004 07/04/22 1005  BP: (!) 146/78 112/71  Pulse: (!) 52   Resp: 20   Temp: 97.9 F (36.6 C)   SpO2: 91%   Weight: 195 lb (88.5 kg)   Height: 5\' 8"  (1.727 m)     General:  WDWN in NAD; vital signs documented above Gait: Not observed HENT: WNL, normocephalic Pulmonary: normal non-labored breathing , without wheezing Cardiac: regular HR Abdomen: soft, NT, no masses Skin: without rashes Vascular Exam/Pulses:  Right Left  Radial 2+ (normal) 2+ (normal)  Ulnar 2+ (normal) 2+ (normal)  Femoral    Popliteal    DP 2+ (normal) 2+ (normal)  PT     Extremities: without ischemic changes, without Gangrene , without cellulitis; without open wounds;  Musculoskeletal: no muscle wasting or atrophy  Neurologic: A&O X 3;  No focal weakness or paresthesias are detected Psychiatric:  The pt has Normal affect.   Non-Invasive Vascular Imaging:   Summary:  Right Carotid: Velocities in the right ICA are consistent with a 1-39%  stenosis.   Left Carotid: Velocities in the left ICA are consistent with a 1-39%  stenosis.               ASSESSMENT/PLAN: Peter Greene is a 82 y.o. 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    Peter Greene continues to do well.  He continues to take aspirin, Eliquis, statin No new TIA, stroke, amaurosis.  Continues to travel and work part-time.  Plan to see Azarion in 1 years time with repeat duplex of his carotid arteries bilaterally as well as repeat ultrasound insonation of his previous EVAR to ensure no endoleak or growth.    Victorino Sparrow, MD [facility] [phone]

## 2022-07-04 ENCOUNTER — Encounter: Payer: Self-pay | Admitting: Vascular Surgery

## 2022-07-04 ENCOUNTER — Ambulatory Visit (INDEPENDENT_AMBULATORY_CARE_PROVIDER_SITE_OTHER): Payer: Medicare Other | Admitting: Vascular Surgery

## 2022-07-04 ENCOUNTER — Other Ambulatory Visit: Payer: Self-pay

## 2022-07-04 ENCOUNTER — Ambulatory Visit (HOSPITAL_COMMUNITY)
Admission: RE | Admit: 2022-07-04 | Discharge: 2022-07-04 | Disposition: A | Discharge status: Home / Self Care | Payer: Medicare Other | Source: Ambulatory Visit | Attending: Surgery | Admitting: Surgery

## 2022-07-04 ENCOUNTER — Telehealth: Payer: Self-pay | Admitting: Student

## 2022-07-04 VITALS — BP 112/71 | HR 52 | Temp 97.9°F | Resp 20 | Ht 68.0 in | Wt 195.0 lb

## 2022-07-04 DIAGNOSIS — R0989 Other specified symptoms and signs involving the circulatory and respiratory systems: Secondary | ICD-10-CM

## 2022-07-04 DIAGNOSIS — Z8679 Personal history of other diseases of the circulatory system: Secondary | ICD-10-CM

## 2022-07-04 DIAGNOSIS — I6522 Occlusion and stenosis of left carotid artery: Secondary | ICD-10-CM

## 2022-07-04 DIAGNOSIS — Z9889 Other specified postprocedural states: Secondary | ICD-10-CM | POA: Diagnosis not present

## 2022-07-04 DIAGNOSIS — R911 Solitary pulmonary nodule: Secondary | ICD-10-CM

## 2022-07-04 NOTE — Progress Notes (Signed)
The proposed treatment discussed in conference is for discussion purpose only and is not a binding recommendation.  The patients have not been physically examined, or presented with their treatment options.  Therefore, final treatment plans cannot be decided.  

## 2022-07-04 NOTE — Telephone Encounter (Signed)
Called and reviewed tumor board discussion with Peter Greene. Referrals placed to [facility] for consideration of wedge/sublobar resection and radiation oncology for possible empiric SBRT alternatively.

## 2022-07-07 ENCOUNTER — Telehealth: Payer: Self-pay | Admitting: Radiation Oncology

## 2022-07-07 DIAGNOSIS — C3431 Malignant neoplasm of lower lobe, right bronchus or lung: Secondary | ICD-10-CM | POA: Insufficient documentation

## 2022-07-07 NOTE — Telephone Encounter (Signed)
LVM to schedule CON with Dr. Moody 

## 2022-07-07 NOTE — Progress Notes (Signed)
Radiation Oncology         ([phone]) [phone] ________________________________  Name: Peter Greene        MRN: [id number]  Date of Service: 07/09/2022 DOB: [birth year]  WU:[id number], Gerlene Burdock, MD  Omar Person, MD     REFERRING PHYSICIAN: Omar Person, MD   DIAGNOSIS: The encounter diagnosis was Malignant neoplasm of lower lobe of right lung.   HISTORY OF PRESENT ILLNESS: [name] is a 82 y.o. male seen at the request of Dr. Thora Lance for a probable early-stage lung cancer.  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  He was diagnosed in May 2023 with a community-acquired pneumonia that led to sepsis and was treated with antibiotics and steroids.  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  He established with pulmonary medicine as a result of his hospitalization last May.  He has been followed by Dr. Isaiah Serge and a CT of the chest with high-resolution in November 2023 given his history of interstitial lung disease was performed, and showed a nodule on the right lower lobe measuring 11 mm with additional scattered small pulmonary nodules that were considered unchanged in comparison to prior CT including a left upper lobe nodule measuring 5 mm.  There was a lesion in the right hepatic lobe measuring 2.4 cm.  No evidence of adenopathy was appreciated.  He was counseled on a PET scan to further investigate the lung and liver findings, and this was performed on 03/19/2022.  Hypermetabolic activity was seen in the right lower lobe nodule measuring 10 mm in greatest dimension with an SUV of 4.1.  No other hypermetabolic changes  in the lungs or in the region of the lymph nodes were appreciated.  The lesion in the liver showed low-attenuation measuring 2.7 cm with no metabolic activity favored to be benign.  He has a large fat filled right inguinal hernia on the scan as well.  No other concerns for malignancy were appreciated.  He was counseled on bronchoscopy which was performed on 04/17/2022.  This procedure yielded scant cellularity but benign bronchial cells and no malignancy identified.  He was counseled on repeat checking and a CT super D scan prior to his second bronchoscopy on 06/04/2022 showed an increase in the nodule measuring 1.3 cm, and stability in a left upper lobe nodule measuring 6 mm and what appears to be benign findings in the liver and spleen.  He underwent a second bronchoscopy on 06/19/2022, cytology again showed benign bronchial mucosa with chronic inflammation in the aspirate and in the brushings nondiagnostic material no malignancy could be identified.  Given his history of pulmonary disease, he has also had pulmonary function testing.  His case was discussed in thoracic oncology conference, and is concerning for early stage I lung cancer in the right lower lobe.  He is seen today to discuss possible options of stereotactic radiation and is meeting with Dr. Cliffton Asters next week to discuss options of surgical resection.    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   Hyperlipidemia    atorvastatin   Hypothyroidism    Interstitial lung disease    Laryngeal cancer 2004   Neuromuscular disorder    bilateral feet neuropathy    Pneumonia 07/2021   x 2   Primary cancer of bone marrow    Skin cancer    Head   Thyroid disease    Thyroid nodule 2024   Right lower lobe       PAST SURGICAL HISTORY: Past Surgical History:  Procedure Laterality Date   ABDOMINAL AORTIC ANEURYSM REPAIR     BONE MARROW TRANSPLANT     BRONCHIAL BIOPSY  04/17/2022   Procedure: BRONCHIAL BIOPSIES;  Surgeon: Omar Person, MD;  Location: [facility] ENDOSCOPY;  Service: Pulmonary;;   BRONCHIAL BIOPSY  06/19/2022   Procedure: BRONCHIAL BIOPSIES;  Surgeon: Omar Person, MD;  Location: [facility] ENDOSCOPY;  Service: Pulmonary;;   BRONCHIAL BRUSHINGS  04/17/2022   Procedure: BRONCHIAL BRUSHINGS;  Surgeon: Omar Person, MD;  Location: [facility] ENDOSCOPY;  Service: Pulmonary;;   BRONCHIAL NEEDLE ASPIRATION BIOPSY  04/17/2022   Procedure: BRONCHIAL NEEDLE ASPIRATION BIOPSIES;  Surgeon: Omar Person, MD;  Location[facility] ENDOSCOPY;  Service: Pulmonary;;   BRONCHIAL NEEDLE ASPIRATION BIOPSY  06/19/2022   Procedure: BRONCHIAL NEEDLE ASPIRATION BIOPSIES;  Surgeon: Omar Person, MD;  Location: [facility] ENDOSCOPY;  Service: Pulmonary;;   BRONCHIAL WASHINGS  04/17/2022   Procedure: BRONCHIAL WASHINGS;  Surgeon: Omar Person, MD;  Location: [facility] ENDOSCOPY;  Service: Pulmonary;;   BRONCHIAL WASHINGS  06/19/2022   Procedure: BRONCHIAL WASHINGS;  Surgeon: Omar Person, MD;  Location: [facility] ENDOSCOPY;  Service: Pulmonary;;   CARDIAC CATHETERIZATION     ENDARTERECTOMY Left 01/30/2022   Procedure: ENDARTERECTOMY CAROTID;  Surgeon: Victorino Sparrow, MD;  Location: [facility] OR;  Service: Vascular;  Laterality: Left;   EYE SURGERY     FIDUCIAL MARKER PLACEMENT  06/19/2022   Procedure: FIDUCIAL MARKER PLACEMENT;  Surgeon: Omar Person, MD;  Location: [facility] ENDOSCOPY;  Service: Pulmonary;;   HERNIA REPAIR     KNEE SURGERY Bilateral    Joint replacements   MOHS SURGERY  2024   x 2 R & L   PATCH ANGIOPLASTY Left 01/30/2022   Procedure: PATCH  ANGIOPLASTY WITH XENOSURE BIOLOGIC PATCH 1CMX6CM;  Surgeon: Victorino Sparrow, MD;  Location: [facility] OR;  Service: Vascular;  Laterality: Left;   TONSILLECTOMY  1946   VIDEO BRONCHOSCOPY WITH RADIAL ENDOBRONCHIAL ULTRASOUND  04/17/2022   Procedure: VIDEO BRONCHOSCOPY WITH RADIAL ENDOBRONCHIAL ULTRASOUND;  Surgeon: Omar Person, MD;  Location: [facility] ENDOSCOPY;  Service: Pulmonary;;   VIDEO BRONCHOSCOPY WITH RADIAL ENDOBRONCHIAL ULTRASOUND  06/19/2022   Procedure: VIDEO BRONCHOSCOPY WITH RADIAL ENDOBRONCHIAL ULTRASOUND;  Surgeon: Omar Person, MD;  Location: [facility] ENDOSCOPY;  Service: Pulmonary;;     FAMILY HISTORY: History reviewed. No pertinent family history.   SOCIAL HISTORY:  reports that he quit smoking about 40 years ago. His smoking use included cigarettes. He has never been exposed to tobacco smoke. He has never used smokeless tobacco. He reports current alcohol use of about 2.0 standard drinks of alcohol per week. He reports that he does not use drugs. The patient is married and  lives in [place].    ALLERGIES: Patient has no known allergies.   MEDICATIONS:  Current Outpatient Medications  Medication Sig Dispense Refill   acetaminophen (TYLENOL) 500 MG tablet Take 500 mg by mouth every 6 (six) hours as needed for moderate pain.     aspirin EC 81 MG tablet Take 81 mg by mouth in the morning. Swallow whole.     atorvastatin (LIPITOR) 40 MG tablet Take 40 mg by mouth in the morning.     cycloSPORINE (RESTASIS) 0.05 % ophthalmic emulsion Place 1 drop into both eyes 2 (two) times daily as needed (dry/irritated eyes.).     doxycycline (VIBRAMYCIN) 100 MG capsule Take 100 mg by mouth 2 (two) times daily.     ELIQUIS 5 MG TABS tablet Take 5 mg by mouth 2 (two) times daily.     ipratropium (ATROVENT) 0.03 % nasal spray Place 2 sprays into both nostrils daily.     Krill Oil 350 MG CAPS Take 350 mg by mouth every evening.     levothyroxine (SYNTHROID) 25 MCG tablet Take 25 mcg by mouth  every evening.     Melatonin 10 MG TABS Take by mouth daily as needed (sleep).     metoprolol tartrate (LOPRESSOR) 25 MG tablet Take 25 mg by mouth 2 (two) times daily.     MIEBO 1.338 GM/ML SOLN Place 1 drop into both eyes daily.     Multiple Vitamin (MULTIVITAMIN WITH MINERALS) TABS tablet Take 1 tablet by mouth in the morning. Centrum     neomycin-polymyxin b-dexamethasone (MAXITROL) 3.5-10000-0.1 OINT Place 1 drop into both eyes every evening.     pantoprazole (PROTONIX) 20 MG tablet Take 20 mg by mouth every evening.     prednisoLONE acetate (PRED FORTE) 1 % ophthalmic suspension 1 drop every morning.     solifenacin (VESICARE) 5 MG tablet Take 5 mg by mouth daily.     tamsulosin (FLOMAX) 0.4 MG CAPS capsule Take 0.4 mg by mouth in the morning.     valACYclovir (VALTREX) 500 MG tablet Take 500 mg by mouth in the morning.     VIGAMOX 0.5 % ophthalmic solution Place 1 drop into the left eye 2 (two) times daily.     No current facility-administered medications for this encounter.     REVIEW OF SYSTEMS: On review of systems, the patient reports that he is doing well overall. He had a Mohs' surgery of his cheek and has a dressing over this site. He reports he is not short of breath with light activity but does note this with strenuous activity like exercise. He denise any cough or hemoptysis, chest pain, or tightness. He denies any unintended weight loss.        PHYSICAL EXAM:  Wt Readings from Last 3 Encounters:  07/09/22 205 lb 3.2 oz (93.1 kg)  07/04/22 195 lb (88.5 kg)  06/19/22 195 lb (88.5 kg)   Temp Readings from Last 3 Encounters:  07/09/22 97.8 F (36.6 C)  07/04/22 97.9 F (36.6 C)  06/19/22 98.1 F (36.7 C)   BP Readings from Last 3 Encounters:  07/09/22 (!) 95/55  07/04/22 112/71  06/19/22 118/63   Pulse Readings from Last 3 Encounters:  07/09/22 67  07/04/22 (!) 52  06/19/22 (!) 50   Pain Assessment Pain Score: 0-No pain/10  In general this is a well  appearing caucasian male in no acute distress. He has a dressing over his surgical site in the right cheek. He's alert and oriented x4  and appropriate throughout the examination. Cardiopulmonary assessment is negative for acute distress and he exhibits normal effort.     ECOG = 0  0 - Asymptomatic (Fully active, able to carry on all predisease activities without restriction)  1 - Symptomatic but completely ambulatory (Restricted in physically strenuous activity but ambulatory and able to carry out work of a light or sedentary nature. For example, light housework, office work)  2 - Symptomatic, <50% in bed during the day (Ambulatory and capable of all self care but unable to carry out any work activities. Up and about more than 50% of waking hours)  3 - Symptomatic, >50% in bed, but not bedbound (Capable of only limited self-care, confined to bed or chair 50% or more of waking hours)  4 - Bedbound (Completely disabled. Cannot carry on any self-care. Totally confined to bed or chair)  5 - Death   Santiago Glad MM, Creech RH, Tormey DC, et al. [phone]). "Toxicity and response criteria of the [facility]". Am. Evlyn Clines. Oncol. 5 (6): 649-55    LABORATORY DATA:  Lab Results  Component Value Date   WBC 6.9 06/19/2022   HGB 11.8 (L) 06/19/2022   HCT 37.3 (L) 06/19/2022   MCV 100.0 06/19/2022   PLT 207 06/19/2022   Lab Results  Component Value Date   NA 139 06/19/2022   K 4.0 06/19/2022   CL 103 06/19/2022   CO2 25 06/19/2022   Lab Results  Component Value Date   ALT 19 01/23/2022   AST 22 01/23/2022   ALKPHOS 85 01/23/2022   BILITOT 0.6 01/23/2022      RADIOGRAPHY: VAS US CAROTID  Result Date: 07/04/2022 Carotid Arterial Duplex Study Patient Name:  ALEXIUS HANGARTNER  Date of Exam:   07/04/2022 Medical Rec #: [id number]        Accession #:    [id number] Date of Birth: 08-Apr-1940       Patient Gender: M Patient Age:   67 years Exam Location:  [facility]  Procedure:      VAS US CAROTID Referring Phys: Coral Else --------------------------------------------------------------------------------  Indications:       Carotid artery disease. Risk Factors:      Hyperlipidemia. Other Factors:     Left carotid endarterectomy done 01/30/22. Comparison Study:  11/01/21 prior Performing Technologist: Argentina Ponder RVS  Examination Guidelines: A complete evaluation includes B-mode imaging, spectral Doppler, color Doppler, and power Doppler as needed of all accessible portions of each vessel. Bilateral testing is considered an integral part of a complete examination. Limited examinations for reoccurring indications may be performed as noted.  Right Carotid Findings: +----------+--------+--------+--------+------------------+--------+           PSV cm/sEDV cm/sStenosisPlaque DescriptionComments +----------+--------+--------+--------+------------------+--------+ CCA Prox  71      14              heterogenous               +----------+--------+--------+--------+------------------+--------+ CCA Distal47      10              heterogenous               +----------+--------+--------+--------+------------------+--------+ ICA Prox  91      27      1-39%   heterogenous      tortuous +----------+--------+--------+--------+------------------+--------+ ICA Mid   114     36                                         +----------+--------+--------+--------+------------------+--------+  ICA Distal48      20                                         +----------+--------+--------+--------+------------------+--------+ ECA       58                                                 +----------+--------+--------+--------+------------------+--------+ +----------+--------+-------+--------+-------------------+           PSV cm/sEDV cmsDescribeArm Pressure (mmHG) +----------+--------+-------+--------+-------------------+ Subclavian57                      120                 +----------+--------+-------+--------+-------------------+ +---------+--------+--+--------+-+---------+ VertebralPSV cm/s23EDV cm/s6Antegrade +---------+--------+--+--------+-+---------+  Left Carotid Findings: +----------+--------+--------+--------+------------------+--------+           PSV cm/sEDV cm/sStenosisPlaque DescriptionComments +----------+--------+--------+--------+------------------+--------+ CCA Prox  159     23              heterogenous      tortuous +----------+--------+--------+--------+------------------+--------+ CCA Distal63      13              heterogenous               +----------+--------+--------+--------+------------------+--------+ ICA Prox  52      16      1-39%   heterogenous      tortuous +----------+--------+--------+--------+------------------+--------+ ICA Mid   60      22                                         +----------+--------+--------+--------+------------------+--------+ ICA Distal63      20                                         +----------+--------+--------+--------+------------------+--------+ ECA       91                                                 +----------+--------+--------+--------+------------------+--------+ +----------+--------+--------+--------+-------------------+           PSV cm/sEDV cm/sDescribeArm Pressure (mmHG) +----------+--------+--------+--------+-------------------+ [id number]                      120                 +----------+--------+--------+--------+-------------------+ +---------+--------+--+--------+--+---------+ VertebralPSV cm/s45EDV cm/s13Antegrade +---------+--------+--+--------+--+---------+   Summary: Right Carotid: Velocities in the right ICA are consistent with a 1-39% stenosis. Left Carotid: Velocities in the left ICA are consistent with a 1-39% stenosis. Vertebrals: Bilateral vertebral arteries demonstrate antegrade flow. *See  table(s) above for measurements and observations.  Electronically signed by Gerarda Fraction on 07/04/2022 at 5:55:14 PM.    Final    DG CHEST PORT 1 VIEW  Result Date: 06/19/2022 CLINICAL DATA:  Status post bronchoscopic biopsy EXAM: PORTABLE CHEST 1 VIEW COMPARISON:  Previous chest radiograph done on 04/17/2022, CT chest done on 06/04/2022 FINDINGS: Cardiac size  is within normal limits. Thoracic aorta is tortuous. There are no signs of pulmonary edema or focal pulmonary consolidation. There is a small metallic density in right parahilar region, possibly fiduciary marker at the site of biopsy. There is no pleural effusion or pneumothorax. IMPRESSION: No focal infiltrates are seen. There is no pleural effusion or pneumothorax. Electronically Signed   By: Ernie AvenaPalani  Rathinasamy M.D.   On: 06/19/2022 09:51   DG C-ARM BRONCHOSCOPY  Result Date: 06/19/2022 C-ARM BRONCHOSCOPY: Fluoroscopy was utilized by the requesting physician.  No radiographic interpretation.       IMPRESSION/PLAN: 1. Putative Stage IA, cT1aN0M0, NSCLC of the RLL. Dr. Mitzi HansenMoody discusses the pathology findings and reviews the nature of what we presume to be early stage lung cancer. We discussed the limitations of not having tissue confirmation.  Dr. Mitzi HansenMoody reviews that the standard of care is for surgical resection. However for patients who are not medical candidates to undergo surgery, or who choose to forgo surgery, stereotactic body radiotherapy (SBRT) is an appropriate alternative. We discussed the risks, benefits, short, and long term effects of radiotherapy, as well as the curative intent, and the patient is interested in meeting with Dr. Cliffton AstersLightfoot to hear his perspective. We also discussed the option of a repeat scan in the next month or two to determine next steps.  Dr. Mitzi HansenMoody discusses the delivery and logistics of radiotherapy and if the patient chose radiation would offer 3-5 fractions of SBRT therapy. We will follow up with him after his  visit with Dr. Cliffton AstersLightfoot.   In a visit lasting 75 minutes, greater than 50% of the time was spent face to face discussing the patient's condition, in preparation for the discussion, and coordinating the patient's care.   The above documentation reflects my direct findings during this shared patient visit. Please see the separate note by Dr. Mitzi HansenMoody on this date for the remainder of the patient's plan of care.    Osker MasonAlison C. Jalaysia Lobb, [facility]   **Disclaimer: This note was dictated with voice recognition software. Similar sounding words can inadvertently be transcribed and this note may contain transcription errors which may not have been corrected upon publication of note.**

## 2022-07-08 NOTE — Progress Notes (Signed)
Thoracic Location of Tumor / Histology: Right Lower Lobe Lung  Patient presented   Biopsies of RLL Lung 06/19/2022    Tobacco/Marijuana/Snuff/ETOH use: Former Smoker, Quit 1984  Past/Anticipated interventions by pulmonary, if any: Dr. Thora Lance - repeat nav 06/19/22 under general - needs to stop eliquis 3 days prior, aspirin 5 days prior - CTD serologies, HP panel today, complete ILD packet - PFT in a year, frequency of surveillance CT Chest (for both nodule, ILD) tbd pending biopsy   Past/Anticipated interventions by cardiothoracic surgery, if any:  Dr. Cliffton Asters 07/18/2022  Past/Anticipated interventions by medical oncology, if any:   Signs/Symptoms Weight changes, if any: Denies weight loss. Respiratory complaints, if any: Denies SOB with regular activities.  He notes some with strenuous exercises. Hemoptysis, if any: Denies cough and hemoptysis.  He notes some sinus drainage that is clear in color. Pain issues, if any:  Denies chest pain, pressure, or tightness.  SAFETY ISSUES: Prior radiation? Yes, 2020 at [facility], 2004 Laryngeal Cancer in [place] Pacemaker/ICD? No  Possible current pregnancy? N/a Is the patient on methotrexate? No  Current Complaints / other details:

## 2022-07-09 ENCOUNTER — Encounter: Payer: Self-pay | Admitting: Radiation Oncology

## 2022-07-09 ENCOUNTER — Ambulatory Visit
Admission: RE | Admit: 2022-07-09 | Discharge: 2022-07-09 | Disposition: A | Discharge status: Home / Self Care | Payer: Medicare Other | Source: Ambulatory Visit | Attending: Radiation Oncology | Admitting: Radiation Oncology

## 2022-07-09 VITALS — BP 95/55 | HR 67 | Temp 97.8°F | Resp 20 | Ht 68.0 in | Wt 205.2 lb

## 2022-07-09 DIAGNOSIS — Z79624 Long term (current) use of inhibitors of nucleotide synthesis: Secondary | ICD-10-CM | POA: Insufficient documentation

## 2022-07-09 DIAGNOSIS — E785 Hyperlipidemia, unspecified: Secondary | ICD-10-CM | POA: Insufficient documentation

## 2022-07-09 DIAGNOSIS — Z85828 Personal history of other malignant neoplasm of skin: Secondary | ICD-10-CM | POA: Insufficient documentation

## 2022-07-09 DIAGNOSIS — C3431 Malignant neoplasm of lower lobe, right bronchus or lung: Secondary | ICD-10-CM

## 2022-07-09 DIAGNOSIS — K4090 Unilateral inguinal hernia, without obstruction or gangrene, not specified as recurrent: Secondary | ICD-10-CM | POA: Insufficient documentation

## 2022-07-09 DIAGNOSIS — K219 Gastro-esophageal reflux disease without esophagitis: Secondary | ICD-10-CM | POA: Diagnosis not present

## 2022-07-09 DIAGNOSIS — I4891 Unspecified atrial fibrillation: Secondary | ICD-10-CM | POA: Insufficient documentation

## 2022-07-09 DIAGNOSIS — E041 Nontoxic single thyroid nodule: Secondary | ICD-10-CM | POA: Diagnosis not present

## 2022-07-09 DIAGNOSIS — Z9481 Bone marrow transplant status: Secondary | ICD-10-CM | POA: Diagnosis not present

## 2022-07-09 DIAGNOSIS — Z87891 Personal history of nicotine dependence: Secondary | ICD-10-CM | POA: Diagnosis not present

## 2022-07-09 DIAGNOSIS — Z79899 Other long term (current) drug therapy: Secondary | ICD-10-CM | POA: Insufficient documentation

## 2022-07-09 DIAGNOSIS — Z8521 Personal history of malignant neoplasm of larynx: Secondary | ICD-10-CM | POA: Diagnosis not present

## 2022-07-09 DIAGNOSIS — I714 Abdominal aortic aneurysm, without rupture, unspecified: Secondary | ICD-10-CM | POA: Insufficient documentation

## 2022-07-09 DIAGNOSIS — Z79890 Hormone replacement therapy (postmenopausal): Secondary | ICD-10-CM | POA: Insufficient documentation

## 2022-07-09 DIAGNOSIS — E039 Hypothyroidism, unspecified: Secondary | ICD-10-CM | POA: Insufficient documentation

## 2022-07-09 DIAGNOSIS — Z7901 Long term (current) use of anticoagulants: Secondary | ICD-10-CM | POA: Diagnosis not present

## 2022-07-16 NOTE — Progress Notes (Signed)
[address]       [place] [address]             [phone]                    Nathanal Hermiz [facility] Medical Record #[id number] Date of Birth: October 26, 1940  Referring: Omar Person, MD Primary Care: Geoffry Paradise, MD Primary Cardiologist: None  Chief Complaint:    Chief Complaint  Patient presents with   Lung Lesion    CT super D 3/6, PET 12/20, PFT 3/13, bronch 3/31    History of Present Illness:    Peter Greene 82 y.o. male presents for surgical evaluation of a slowly enlarging right lower lobe pulmonary nodule.  This has been biopsied on 2 occasions and results been nondiagnostic.  He has not smoked in over 40 years.  He does have a history of idiopathic nonspecific interstitial pneumonitis, and underwent biopsy in 2013 which apparently required several staple fires of the upper lobe and lower lobe.  There was some friability of the lower lobe where Progel was injected for hemostasis.  He denies any shortness of breath, weight changes, or neurologic symptoms..        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   Hyperlipidemia    atorvastatin   Hypothyroidism    Interstitial lung disease    Laryngeal cancer 2004   Neuromuscular disorder    bilateral feet neuropathy   Pneumonia 07/2021   x 2   Primary cancer of bone marrow    Skin cancer    Head   Thyroid disease    Thyroid nodule 2024   Right lower lobe    Past Surgical History:  Procedure Laterality Date   ABDOMINAL AORTIC ANEURYSM REPAIR     BONE MARROW TRANSPLANT     BRONCHIAL BIOPSY  04/17/2022   Procedure: BRONCHIAL BIOPSIES;  Surgeon: Omar Person, MD;  Location: [facility] ENDOSCOPY;  Service: Pulmonary;;   BRONCHIAL BIOPSY   06/19/2022   Procedure: BRONCHIAL BIOPSIES;  Surgeon: Omar Person, MD;  Location: [facility] ENDOSCOPY;  Service: Pulmonary;;   BRONCHIAL BRUSHINGS  04/17/2022   Procedure: BRONCHIAL BRUSHINGS;  Surgeon: Omar Person, MD;  Location: [facility] ENDOSCOPY;  Service: Pulmonary;;   BRONCHIAL NEEDLE ASPIRATION BIOPSY  04/17/2022   Procedure: BRONCHIAL NEEDLE ASPIRATION BIOPSIES;  Surgeon: Omar Person, MD;  Location: [facility] ENDOSCOPY;  Service: Pulmonary;;   BRONCHIAL NEEDLE ASPIRATION BIOPSY  06/19/2022   Procedure: BRONCHIAL NEEDLE ASPIRATION BIOPSIES;  Surgeon: Omar Person, MD;  Location: [facility] ENDOSCOPY;  Service: Pulmonary;;   BRONCHIAL WASHINGS  04/17/2022   Procedure: BRONCHIAL WASHINGS;  Surgeon: Omar Person, MD;  Location: [facility] ENDOSCOPY;  Service: Pulmonary;;   BRONCHIAL WASHINGS  06/19/2022   Procedure: BRONCHIAL WASHINGS;  Surgeon: Omar Person, MD;  Location: [facility] ENDOSCOPY;  Service: Pulmonary;;   CARDIAC CATHETERIZATION     ENDARTERECTOMY Left 01/30/2022   Procedure: ENDARTERECTOMY CAROTID;  Surgeon: Victorino Sparrow, MD;  Location: [facility] OR;  Service: Vascular;  Laterality: Left;   EYE SURGERY     FIDUCIAL MARKER PLACEMENT  06/19/2022   Procedure: FIDUCIAL MARKER PLACEMENT;  Surgeon: Omar Person,  MD;  Location: [facility] ENDOSCOPY;  Service: Pulmonary;;   HERNIA REPAIR     KNEE SURGERY Bilateral    Joint replacements   MOHS SURGERY  2024   x 2 R & L   PATCH ANGIOPLASTY Left 01/30/2022   Procedure: PATCH ANGIOPLASTY WITH Livia Snellen BIOLOGIC PATCH [id number];  Surgeon: Victorino Sparrow, MD;  Location: [facility] OR;  Service: Vascular;  Laterality: Left;   TONSILLECTOMY  1946   VIDEO BRONCHOSCOPY WITH RADIAL ENDOBRONCHIAL ULTRASOUND  04/17/2022   Procedure: VIDEO BRONCHOSCOPY WITH RADIAL ENDOBRONCHIAL ULTRASOUND;  Surgeon: Omar Person, MD;  Location: [facility] ENDOSCOPY;  Service: Pulmonary;;   VIDEO BRONCHOSCOPY WITH RADIAL ENDOBRONCHIAL ULTRASOUND  06/19/2022   Procedure: VIDEO  BRONCHOSCOPY WITH RADIAL ENDOBRONCHIAL ULTRASOUND;  Surgeon: Omar Person, MD;  Location: [facility] ENDOSCOPY;  Service: Pulmonary;;    No family history on file.   Social History   Tobacco Use  Smoking Status Former   Types: Cigarettes   Quit date: 1984   Years since quitting: 40.3   Passive exposure: Never  Smokeless Tobacco Never    Social History   Substance and Sexual Activity  Alcohol Use Yes   Alcohol/week: 2.0 standard drinks of alcohol   Types: 2 Standard drinks or equivalent per week     No Known Allergies  Current Outpatient Medications  Medication Sig Dispense Refill   acetaminophen (TYLENOL) 500 MG tablet Take 500 mg by mouth every 6 (six) hours as needed for moderate pain.     aspirin EC 81 MG tablet Take 81 mg by mouth in the morning. Swallow whole.     atorvastatin (LIPITOR) 40 MG tablet Take 40 mg by mouth in the morning.     ELIQUIS 5 MG TABS tablet Take 5 mg by mouth 2 (two) times daily.     ipratropium (ATROVENT) 0.03 % nasal spray Place 2 sprays into both nostrils daily.     Krill Oil 350 MG CAPS Take 350 mg by mouth every evening.     levothyroxine (SYNTHROID) 25 MCG tablet Take 25 mcg by mouth every evening.     Melatonin 10 MG TABS Take by mouth daily as needed (sleep).     metoprolol tartrate (LOPRESSOR) 25 MG tablet Take 25 mg by mouth 2 (two) times daily.     Multiple Vitamin (MULTIVITAMIN WITH MINERALS) TABS tablet Take 1 tablet by mouth in the morning. Centrum     pantoprazole (PROTONIX) 20 MG tablet Take 20 mg by mouth every evening.     solifenacin (VESICARE) 5 MG tablet Take 5 mg by mouth daily.     tamsulosin (FLOMAX) 0.4 MG CAPS capsule Take 0.4 mg by mouth in the morning.     valACYclovir (VALTREX) 500 MG tablet Take 500 mg by mouth in the morning.     No current facility-administered medications for this visit.    Review of Systems  Constitutional:  Negative for malaise/fatigue and weight loss.  Respiratory:  Negative for cough and  shortness of breath.   Cardiovascular:  Negative for chest pain.  Neurological: Negative.      PHYSICAL EXAMINATION: BP 128/74 (BP Location: Right Arm, Patient Position: Sitting)   Pulse (!) 51   Resp 20   Ht 5\' 8"  (1.727 m)   Wt 202 lb (91.6 kg)   SpO2 94% Comment: ra  BMI 30.71 kg/m  Physical Exam HENT:     Head: Normocephalic and atraumatic.  Eyes:     Extraocular Movements: Extraocular movements intact.  Cardiovascular:  Rate and Rhythm: Tachycardia present. Rhythm irregular.  Pulmonary:     Effort: Pulmonary effort is normal. No respiratory distress.  Abdominal:     General: There is no distension.  Musculoskeletal:        General: Normal range of motion.     Cervical back: Normal range of motion.  Skin:    General: Skin is warm and dry.  Neurological:     General: No focal deficit present.     Mental Status: He is oriented to person, place, and time.     Diagnostic Studies & Laboratory data:     Recent Radiology Findings:   VAS US CAROTID  Result Date: 07/04/2022 Carotid Arterial Duplex Study Patient Name:  Peter Greene  Date of Exam:   07/04/2022 Medical Rec #: [id number]        Accession #:    [id number] Date of Birth: May 23, 1940       Patient Gender: M Patient Age:   13 years Exam Location:  [facility] Procedure:      VAS US CAROTID Referring Phys: Coral Else --------------------------------------------------------------------------------  Indications:       Carotid artery disease. Risk Factors:      Hyperlipidemia. Other Factors:     Left carotid endarterectomy done 01/30/22. Comparison Study:  11/01/21 prior Performing Technologist: Argentina Ponder RVS  Examination Guidelines: A complete evaluation includes B-mode imaging, spectral Doppler, color Doppler, and power Doppler as needed of all accessible portions of each vessel. Bilateral testing is considered an integral part of a complete examination. Limited examinations for reoccurring  indications may be performed as noted.  Right Carotid Findings: +----------+--------+--------+--------+------------------+--------+           PSV cm/sEDV cm/sStenosisPlaque DescriptionComments +----------+--------+--------+--------+------------------+--------+ CCA Prox  71      14              heterogenous               +----------+--------+--------+--------+------------------+--------+ CCA Distal47      10              heterogenous               +----------+--------+--------+--------+------------------+--------+ ICA Prox  91      27      1-39%   heterogenous      tortuous +----------+--------+--------+--------+------------------+--------+ ICA Mid   114     36                                         +----------+--------+--------+--------+------------------+--------+ ICA Distal48      20                                         +----------+--------+--------+--------+------------------+--------+ ECA       58                                                 +----------+--------+--------+--------+------------------+--------+ +----------+--------+-------+--------+-------------------+           PSV cm/sEDV cmsDescribeArm Pressure (mmHG) +----------+--------+-------+--------+-------------------+ Subclavian57                     120                 +----------+--------+-------+--------+-------------------+ +---------+--------+--+--------+-+---------+  VertebralPSV cm/s23EDV cm/s6Antegrade +---------+--------+--+--------+-+---------+  Left Carotid Findings: +----------+--------+--------+--------+------------------+--------+           PSV cm/sEDV cm/sStenosisPlaque DescriptionComments +----------+--------+--------+--------+------------------+--------+ CCA Prox  159     23              heterogenous      tortuous +----------+--------+--------+--------+------------------+--------+ CCA Distal63      13              heterogenous                +----------+--------+--------+--------+------------------+--------+ ICA Prox  52      16      1-39%   heterogenous      tortuous +----------+--------+--------+--------+------------------+--------+ ICA Mid   60      22                                         +----------+--------+--------+--------+------------------+--------+ ICA Distal63      20                                         +----------+--------+--------+--------+------------------+--------+ ECA       91                                                 +----------+--------+--------+--------+------------------+--------+ +----------+--------+--------+--------+-------------------+           PSV cm/sEDV cm/sDescribeArm Pressure (mmHG) +----------+--------+--------+--------+-------------------+ [id number]                      120                 +----------+--------+--------+--------+-------------------+ +---------+--------+--+--------+--+---------+ VertebralPSV cm/s45EDV cm/s13Antegrade +---------+--------+--+--------+--+---------+   Summary: Right Carotid: Velocities in the right ICA are consistent with a 1-39% stenosis. Left Carotid: Velocities in the left ICA are consistent with a 1-39% stenosis. Vertebrals: Bilateral vertebral arteries demonstrate antegrade flow. *See table(s) above for measurements and observations.  Electronically signed by Gerarda Fraction on 07/04/2022 at 5:55:14 PM.    Final    DG CHEST PORT 1 VIEW  Result Date: 06/19/2022 CLINICAL DATA:  Status post bronchoscopic biopsy EXAM: PORTABLE CHEST 1 VIEW COMPARISON:  Previous chest radiograph done on 04/17/2022, CT chest done on 06/04/2022 FINDINGS: Cardiac size is within normal limits. Thoracic aorta is tortuous. There are no signs of pulmonary edema or focal pulmonary consolidation. There is a small metallic density in right parahilar region, possibly fiduciary marker at the site of biopsy. There is no pleural effusion or pneumothorax.  IMPRESSION: No focal infiltrates are seen. There is no pleural effusion or pneumothorax. Electronically Signed   By: Ernie Avena M.D.   On: 06/19/2022 09:51   DG C-ARM BRONCHOSCOPY  Result Date: 06/19/2022 C-ARM BRONCHOSCOPY: Fluoroscopy was utilized by the requesting physician.  No radiographic interpretation.       I have independently reviewed the above radiology studies  and reviewed the findings with the patient.   Recent Lab Findings: Lab Results  Component Value Date   WBC 6.9 06/19/2022   HGB 11.8 (L) 06/19/2022   HCT 37.3 (L) 06/19/2022   PLT 207 06/19/2022   GLUCOSE 89 06/19/2022   CHOL 112 01/31/2022   TRIG 54 01/31/2022  HDL 30 (L) 01/31/2022   LDLCALC 71 01/31/2022   ALT 19 01/23/2022   AST 22 01/23/2022   NA 139 06/19/2022   K 4.0 06/19/2022   CL 103 06/19/2022   CREATININE 1.32 (H) 06/19/2022   BUN 23 06/19/2022   CO2 25 06/19/2022   INR 1.4 (H) 01/23/2022     PFTs:  - FVC: 73% - FEV1: 86% -DLCO: 59%  FINAL MICROSCOPIC DIAGNOSIS:  A. LUNG, RLL, FINE NEEDLE ASPIRATION AND BIOPSY:  - Benign bronchial mucosa with chronic inflammation  - No malignant cells identified  - See comment   B. LUNG, RLL, BRUSHING:  - Nondiagnostic material (blood only)    PET 12/23  CHEST: Nodule of concern in the lobe RIGHT lower lobe is again demonstrated measuring 10 mm (image 89/series 4) unchanged in size from recent CT exam. Nodule has a moderate to high metabolic activity for size with SUV max equal 4.1 (image 89).  CT Chest 3/24  Lungs/Pleura: Pleural and parenchymal scarring signs of subpleural reticulation greatest in the lung apices. Postoperative changes in the RIGHT lower lobe with wedge resection similar to prior imaging.   Spiculated RIGHT lower lobe nodule with enlargement since previous imaging, 13 x 9 mm as compared to 8 x 6 mm (image 51/3)  Mild pulmonary emphysema.   Stable nodule in the LEFT upper lobe measuring 6 mm (image 19/3)  no consolidation. No sign of pleural effusion. Airways are patent.     Assessment / Plan:   82yo male with enlarging RLL pulmonary nodule measuring 1.3cm.  Biopsies were nondiagnostic.  The patient also has a stable LUL subcm nodule which has been stable.  Given his previous surgical history and review of the records it is possible that this could be related to the staple firing.  It is concerning that it has enlarged over the last few years.  I will represent his case in tumor board, the decision is made to proceed with another biopsy that likely will just perform a wedge resection as opposed to a segmentectomy.  If we do go down the surgery route he will require stress test, and a 3-day hold on his Eliquis.      I  spent 40 minutes with  the patient face to face in counseling and coordination of care.    Corliss Skains 07/18/2022 2:51 PM

## 2022-07-18 ENCOUNTER — Other Ambulatory Visit: Payer: Self-pay | Admitting: Thoracic Surgery (Cardiothoracic Vascular Surgery)

## 2022-07-18 ENCOUNTER — Encounter: Payer: Medicare Other | Admitting: Thoracic Surgery (Cardiothoracic Vascular Surgery)

## 2022-07-18 ENCOUNTER — Institutional Professional Consult (permissible substitution) (INDEPENDENT_AMBULATORY_CARE_PROVIDER_SITE_OTHER): Payer: Medicare Other | Admitting: Thoracic Surgery (Cardiothoracic Vascular Surgery)

## 2022-07-18 VITALS — BP 128/74 | HR 51 | Resp 20 | Ht 68.0 in | Wt 202.0 lb

## 2022-07-18 DIAGNOSIS — R9430 Abnormal result of cardiovascular function study, unspecified: Secondary | ICD-10-CM

## 2022-07-18 DIAGNOSIS — R911 Solitary pulmonary nodule: Secondary | ICD-10-CM

## 2022-07-25 ENCOUNTER — Ambulatory Visit (INDEPENDENT_AMBULATORY_CARE_PROVIDER_SITE_OTHER): Payer: Medicare Other | Admitting: Thoracic Surgery (Cardiothoracic Vascular Surgery)

## 2022-07-25 DIAGNOSIS — R911 Solitary pulmonary nodule: Secondary | ICD-10-CM | POA: Diagnosis not present

## 2022-07-25 NOTE — Progress Notes (Signed)
     [address]       [place] [address]             [phone]       Patient: Home Provider: Office Consent for Telemedicine visit obtained.  Today's visit was completed via a real-time telehealth (see specific modality noted below). The patient/authorized person provided oral consent at the time of the visit to engage in a telemedicine encounter with the present provider at [facility]. The patient/authorized person was informed of the potential benefits, limitations, and risks of telemedicine. The patient/authorized person expressed understanding that the laws that protect confidentiality also apply to telemedicine. The patient/authorized person acknowledged understanding that telemedicine does not provide emergency services and that he or she would need to call 911 or proceed to the nearest hospital for help if such a need arose.   Total time spent in the clinical discussion 10 minutes.  Telehealth Modality: Phone visit (audio only)  I had a telephone visit with Mr. Ziegler and his wife.  I explained to him that due to the previous operation with all the bleeding, I am concerned that he may have significant scar tissue.  We talked about several options including a robotic assisted segmentectomy versus wedge resection versus SBRT.  The unsure as to how to proceed.  I think that with his lung function and comorbidities, I explained to him that SBRT would offer the least risk.  They will call us back to let us know their decision.  If he elects to have surgery he will need to be off of his Eliquis for 3 days, and he will need a stress test.

## 2022-07-29 ENCOUNTER — Telehealth: Payer: Self-pay | Admitting: Radiation Oncology

## 2022-07-29 ENCOUNTER — Other Ambulatory Visit: Payer: Self-pay | Admitting: Radiation Oncology

## 2022-07-29 ENCOUNTER — Telehealth: Payer: Self-pay | Admitting: *Deleted

## 2022-07-29 DIAGNOSIS — R911 Solitary pulmonary nodule: Secondary | ICD-10-CM

## 2022-07-29 NOTE — Telephone Encounter (Signed)
Called patient to inform of CT for 08-02-22- arrival time- 1:45 pm @ [facility] Radiology, no restrictions to test, spoke with patient's wife and she is aware of this scan and the instructions

## 2022-07-29 NOTE — Telephone Encounter (Signed)
I received a message asking to call the patient after his visit with Dr. Cliffton Asters.  He and his wife are still struggling with making a decision regarding what approach to take.  Given his idiopathic pulmonary fibrosis, and history of scarring, Dr. Cliffton Asters is clear about the risks that surgery pose for additional scarring or difficulty with doing the procedure.  They are also concerned about risks of bleeding, heart attack and stroke and if he were to consider surgery which is still offered, he would need to see cardiology for stress testing.  Again since we do not have definitive tissue confirmation of cancer the patient's wife is also concerned about being so aggressive.  We discussed another interval CT scan since his last was over a month ago.  They are in agreement to do another CT scan desire follow-up consultation with Dr. Mitzi Hansen, and will also simultaneously touch base with cardiology to get scheduled for cardiac stress test to determine anesthetic risks.  He will come next week for this visit.

## 2022-07-30 ENCOUNTER — Ambulatory Visit: Payer: Medicare Other | Admitting: Radiation Oncology

## 2022-07-30 ENCOUNTER — Ambulatory Visit (INDEPENDENT_AMBULATORY_CARE_PROVIDER_SITE_OTHER): Payer: Medicare Other | Admitting: Pulmonary Disease

## 2022-07-30 ENCOUNTER — Ambulatory Visit: Payer: Medicare Other

## 2022-07-30 ENCOUNTER — Encounter: Payer: Self-pay | Admitting: Pulmonary Disease

## 2022-07-30 VITALS — BP 114/58 | HR 60 | Temp 97.9°F | Ht 68.0 in | Wt 197.0 lb

## 2022-07-30 DIAGNOSIS — J849 Interstitial pulmonary disease, unspecified: Secondary | ICD-10-CM

## 2022-07-30 DIAGNOSIS — R911 Solitary pulmonary nodule: Secondary | ICD-10-CM

## 2022-07-30 NOTE — Patient Instructions (Signed)
Please use Flonase for postnasal drip You can use Allegra as well.  If that is not effective stronger antihistamine such as Xyzal, Benadryl or chlorpheniramine can be used.  All these are available over-the-counter Please keep up your follow-up appointment for heart CT scan and radiation oncology I will see you back in clinic in 6 months.

## 2022-07-30 NOTE — Progress Notes (Signed)
Peter Greene    [id number]    1940/04/25  Primary Care Physician:Aronson, Gerlene Burdock, MD  Referring Physician: Geoffry Paradise, MD [address] [place],  [place] [address]   Chief complaint: Follow up for interstitial lung disease, lung nodule  HPI: 82 y.o. with history of laryngeal cancer, atrial fibrillation, myelodysplastic syndrome.  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  Used to follow with Dr.Thomas J. Tedra Coupe, pulmonary in [place] and was told that he had idiopathic pneumonia treated with a prolonged steroid taper.  Reviewed notes from Dr. Timothy Lasso who did not have much details on the note but he is being treated for idiopathic pneumonia with prolonged steroid taper.  There is no mention of biopsy. He appears to have had surgical lung biopsy in the past but the patient does not recall the date of the procedure.  He is currently not on any specific treatments for interstitial lung disease and is transferring care as he moved [place] to be closer to his family.  He was hospitalized in the middle of May 2023 with sepsis, community-acquired pneumonia with CT scan showing focal consolidations from baseline vesicular.  Years.  Treated with Rocephin, Azithromycin and sent home on Augmentin with a steroid taper.  Overall he feels improved and back to baseline  Pets: No pets Occupation: Retired Firefighter Exposures: No mold exposures.  No mold, hot tub, Jacuzzi.  No feather pillows or comforters ILD questionnaire 08/20/2021-negative Smoking history: 20-pack-year smoker.  Quit in 1984 Travel history: Used to live in [place], [place].  No significant recent travel Relevant family history: No family history of lung disease  Interim history: He is now being followed for an enlarging right  upper lobe nodule with positive PET signal Underwent navigational bronchoscopy twice in January and March 2024 to which unfortunately was non diagnostic.  Case has been discussed at tumor board and evaluated by cardiothoracic surgery, radiation oncology.  Follow-up CT is pending and decision needs to be made for resection versus empiric SBRT  Outpatient Encounter Medications as of 07/30/2022  Medication Sig   acetaminophen (TYLENOL) 500 MG tablet Take 500 mg by mouth every 6 (six) hours as needed for moderate pain.   aspirin EC 81 MG tablet Take 81 mg by mouth in the morning. Swallow whole.   atorvastatin (LIPITOR) 40 MG tablet Take 40 mg by mouth in the morning.   ELIQUIS 5 MG TABS tablet Take 5 mg by mouth 2 (two) times daily.   ipratropium (ATROVENT) 0.03 % nasal spray Place 2 sprays into both nostrils daily.   Krill Oil 350 MG CAPS Take 350 mg by mouth every evening.   levothyroxine (SYNTHROID) 25 MCG tablet Take 25 mcg by mouth every evening.   Melatonin 10 MG TABS Take by mouth daily as needed (sleep).   metoprolol tartrate (LOPRESSOR) 25 MG tablet Take 25 mg by mouth 2 (two) times daily.   Multiple Vitamin (MULTIVITAMIN WITH MINERALS) TABS tablet Take 1 tablet by mouth in the morning. Centrum   pantoprazole (PROTONIX) 20 MG tablet Take 20 mg by mouth every evening.   solifenacin (VESICARE) 5 MG tablet Take 5 mg by mouth daily.   tamsulosin (FLOMAX) 0.4 MG CAPS capsule Take 0.4 mg by mouth in the morning.   valACYclovir (VALTREX) 500 MG tablet Take 500 mg by mouth  in the morning. (Patient not taking: Reported on 07/30/2022)   No facility-administered encounter medications on file as of 07/30/2022.   Physical Exam: Blood pressure (!) 114/58, pulse 60, temperature 97.9 F (36.6 C), temperature source Oral, height 5\' 8"  (1.727 m), weight 197 lb (89.4 kg), SpO2 98 %. Gen:      No acute distress HEENT:  EOMI, sclera anicteric Neck:     No masses; no thyromegaly Lungs:    Clear to auscultation  bilaterally; normal respiratory effort CV:         Regular rate and rhythm; no murmurs Abd:      + bowel sounds; soft, non-tender; no palpable masses, no distension Ext:    No edema; adequate peripheral perfusion Skin:      Warm and dry; no rash Neuro: alert and oriented x 3 Psych: normal mood and affect   Data Reviewed: Imaging: CT chest 08/11/2021-multifocal areas of consolidation mostly in the right middle lobe and lower lobes.  Heterogeneous peripheral reticulation.    High resolution CT 02/25/2022-upper lobe reticular groundglass opacities with honeycomb changes, alternate diagnosis.  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  PET scan 03/23/2022-hypermetabolic right lower lobe nodule  CT scan 06/05/2022-enlargement of spiculated right lower lobe nodule, stable scarring in the lungs. I have reviewed the images below.  PFTs: 01/01/2022 FVC 2.54 [6 7%], FEV1 2.19 [81%], F/F 86, TLC 4.16 [62%], DLCO 13.02 [56%]  06/11/2022 FVC 2.75 [73%], FEV1 2.35 [88%], F/F85, TLC 5.13 [77%], DLCO 13.62 [59%] Moderate restriction, diffusion defect.  Labs: CTD serologies 06/28/2022-significant for rheumatoid factor 16, hypersensitive panel- negative  Assessment:  Lung nodule Concerning for malignancy due to PET avidity and enlargement in size. 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 He had apparently been treated by prolonged steroid taper by his pulmonologist in [place].  He is currently not on treatment now.  PFTs reviewed with restriction and diffusion impairment however they have improved in February 2024 compared to 2023.  Will continue to follow this for now.  Postnasal drip Flonase can use over-the-counter Allegra or antihistamine such as Xyzal or  Benadryl.  Plan/Recommendations: Follow-up in 6 months.  Chilton Greathouse MD [facility] 07/30/2022, 10:05 AM  CC: Geoffry Paradise, MD

## 2022-07-31 ENCOUNTER — Other Ambulatory Visit (HOSPITAL_COMMUNITY): Payer: Medicare Other

## 2022-08-02 ENCOUNTER — Ambulatory Visit (HOSPITAL_BASED_OUTPATIENT_CLINIC_OR_DEPARTMENT_OTHER)
Admission: RE | Admit: 2022-08-02 | Discharge: 2022-08-02 | Disposition: A | Discharge status: Home / Self Care | Payer: Medicare Other | Source: Ambulatory Visit | Attending: Radiation Oncology | Admitting: Radiation Oncology

## 2022-08-02 DIAGNOSIS — R911 Solitary pulmonary nodule: Secondary | ICD-10-CM | POA: Diagnosis not present

## 2022-08-04 ENCOUNTER — Encounter: Payer: Self-pay | Admitting: Radiation Oncology

## 2022-08-04 NOTE — Progress Notes (Signed)
Radiation Oncology         [phone] ________________________________  Name: Peter Greene        MRN: [id number]  Date of Service: 08/06/2022 DOB: 1940-04-06  WU:[id number], Gerlene Burdock, MD  Omar Person, MD     REFERRING PHYSICIAN: Omar Person, MD   DIAGNOSIS: The encounter diagnosis was Malignant neoplasm of lower lobe of right lung [facility]).   HISTORY OF PRESENT ILLNESS: Peter Greene is a 82 y.o. male originally seen at the request of Dr. Thora Lance for a probable early-stage lung cancer.  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  He was diagnosed in May 2023 with a community-acquired pneumonia that led to sepsis and was treated with antibiotics and steroids.  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  He established with pulmonary medicine as a result of his hospitalization last May.  He has been followed by Dr. Isaiah Serge and a CT of the chest with high-resolution in November 2023 given his history of interstitial lung disease was performed, and showed a nodule on the right lower lobe measuring 11 mm with additional scattered small pulmonary nodules that were considered unchanged in comparison to prior CT including a left upper lobe nodule measuring 5 mm.  There was a lesion in the right hepatic lobe measuring 2.4 cm.  No evidence of adenopathy was appreciated.  He was counseled on a PET scan to further investigate the lung and liver findings, and this was performed on 03/19/2022.  Hypermetabolic activity was seen in the right lower lobe nodule measuring 10 mm in greatest dimension with an SUV of 4.1.  No other  hypermetabolic changes in the lungs or in the region of the lymph nodes were appreciated.  The lesion in the liver showed low-attenuation measuring 2.7 cm with no metabolic activity favored to be benign.  He has a large fat filled right inguinal hernia on the scan as well.  No other concerns for malignancy were appreciated.  He was counseled on bronchoscopy which was performed on 04/17/2022.  This procedure yielded scant cellularity but benign bronchial cells and no malignancy identified.  He was counseled on repeat checking and a CT super D scan prior to his second bronchoscopy on 06/04/2022 showed an increase in the nodule measuring 1.3 cm, and stability in a left upper lobe nodule measuring 6 mm and what appears to be benign findings in the liver and spleen.  He underwent a second bronchoscopy on 06/19/2022, cytology again showed benign bronchial mucosa with chronic inflammation in the aspirate and in the brushings nondiagnostic material no malignancy could be identified.  Given his history of pulmonary disease, he has also had pulmonary function testing.  His case was discussed in thoracic oncology conference, and is concerning for early stage I lung cancer in the right lower lobe.    He met a few weeks ago with [place] in clinic and has also seen cardiothoracic surgery. He is still a possible candidate for surgical resection, but is going to see cardiology for stress testing to determine clearance for surgery. He remains a candidate alternatively for stereotactic body radiotherapy (SBRT). He did have  a repeat CT chest on 08/02/22 that showed th enodule in the RLL now measuring 1.9 cm. He's seen today to review options of SBRT if he were to select this modality of treatment.     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   Hyperlipidemia    atorvastatin   Hypothyroidism    Interstitial lung disease (HCC)    Laryngeal cancer (HCC) 2004   Neuromuscular disorder (HCC)    bilateral feet neuropathy   Pneumonia 07/2021   x 2   Primary cancer of bone marrow (HCC)    Skin cancer    Head   Thyroid disease    Thyroid nodule 2024   Right lower lobe       PAST SURGICAL HISTORY: Past Surgical History:  Procedure Laterality Date   ABDOMINAL AORTIC ANEURYSM REPAIR     BONE MARROW TRANSPLANT     BRONCHIAL BIOPSY  04/17/2022   Procedure: BRONCHIAL BIOPSIES;  Surgeon: Omar Person, MD;  Location: [facility] ENDOSCOPY;  Service: Pulmonary;;   BRONCHIAL BIOPSY  06/19/2022   Procedure: BRONCHIAL BIOPSIES;  Surgeon: Omar Person, MD;  Location: [facility] ENDOSCOPY;  Service: Pulmonary;;   BRONCHIAL BRUSHINGS  04/17/2022   Procedure: BRONCHIAL BRUSHINGS;  Surgeon: Omar Person, MD;  Location: [facility] ENDOSCOPY;  Service: Pulmonary;;   BRONCHIAL NEEDLE ASPIRATION BIOPSY  04/17/2022   Procedure: BRONCHIAL NEEDLE ASPIRATION BIOPSIES;  Surgeon: Omar Person, MD;  Location: [facility] ENDOSCOPY;  Service: Pulmonary;;   BRONCHIAL NEEDLE ASPIRATION BIOPSY  06/19/2022   Procedure: BRONCHIAL NEEDLE ASPIRATION BIOPSIES;  Surgeon: Omar Person, MD;  Location: [facility] ENDOSCOPY;  Service: Pulmonary;;   BRONCHIAL WASHINGS  04/17/2022   Procedure: BRONCHIAL WASHINGS;  Surgeon: Omar Person, MD;  Location: [facility] ENDOSCOPY;  Service: Pulmonary;;   BRONCHIAL WASHINGS  06/19/2022   Procedure: BRONCHIAL WASHINGS;  Surgeon: Omar Person, MD;  Location: [facility] ENDOSCOPY;  Service: Pulmonary;;   CARDIAC CATHETERIZATION     ENDARTERECTOMY Left 01/30/2022   Procedure: ENDARTERECTOMY CAROTID;  Surgeon: Victorino Sparrow, MD;   Location: [facility] OR;  Service: Vascular;  Laterality: Left;   EYE SURGERY     FIDUCIAL MARKER PLACEMENT  06/19/2022   Procedure: FIDUCIAL MARKER PLACEMENT;  Surgeon: Omar Person, MD;  Location: [facility] ENDOSCOPY;  Service: Pulmonary;;   HERNIA REPAIR     KNEE SURGERY Bilateral    Joint replacements   MOHS SURGERY  2024   x 2 R & L   PATCH ANGIOPLASTY Left 01/30/2022   Procedure: PATCH ANGIOPLASTY WITH Livia Snellen BIOLOGIC PATCH 1CMX6CM;  Surgeon: Victorino Sparrow, MD;  Location: [facility] OR;  Service: Vascular;  Laterality: Left;   TONSILLECTOMY  1946   VIDEO BRONCHOSCOPY WITH RADIAL ENDOBRONCHIAL ULTRASOUND  04/17/2022   Procedure: VIDEO BRONCHOSCOPY WITH RADIAL ENDOBRONCHIAL ULTRASOUND;  Surgeon: Omar Person, MD;  Location: [facility] ENDOSCOPY;  Service: Pulmonary;;   VIDEO BRONCHOSCOPY WITH RADIAL ENDOBRONCHIAL ULTRASOUND  06/19/2022   Procedure: VIDEO BRONCHOSCOPY WITH RADIAL ENDOBRONCHIAL ULTRASOUND;  Surgeon: Omar Person, MD;  Location: [facility] ENDOSCOPY;  Service: Pulmonary;;  FAMILY HISTORY: No family history on file.   SOCIAL HISTORY:  reports that he quit smoking about 40 years ago. His smoking use included cigarettes. He has never been exposed to tobacco smoke. He has never used smokeless tobacco. He reports current alcohol use of about 2.0 standard drinks of alcohol per week. He reports that he does not use drugs. The patient is married and lives in [place].    ALLERGIES: Patient has no known allergies.   MEDICATIONS:  Current Outpatient Medications  Medication Sig Dispense Refill   acetaminophen (TYLENOL) 500 MG tablet Take 500 mg by mouth every 6 (six) hours as needed for moderate pain.     aspirin EC 81 MG tablet Take 81 mg by mouth in the morning. Swallow whole.     atorvastatin (LIPITOR) 40 MG tablet Take 40 mg by mouth in the morning.     ELIQUIS 5 MG TABS tablet Take 5 mg by mouth 2 (two) times daily.     ipratropium (ATROVENT) 0.03 % nasal spray Place 2 sprays into  both nostrils daily.     Krill Oil 350 MG CAPS Take 350 mg by mouth every evening.     levothyroxine (SYNTHROID) 25 MCG tablet Take 25 mcg by mouth every evening.     Melatonin 10 MG TABS Take by mouth daily as needed (sleep).     metoprolol tartrate (LOPRESSOR) 25 MG tablet Take 25 mg by mouth 2 (two) times daily.     Multiple Vitamin (MULTIVITAMIN WITH MINERALS) TABS tablet Take 1 tablet by mouth in the morning. Centrum     pantoprazole (PROTONIX) 20 MG tablet Take 20 mg by mouth every evening.     solifenacin (VESICARE) 5 MG tablet Take 5 mg by mouth daily.     tamsulosin (FLOMAX) 0.4 MG CAPS capsule Take 0.4 mg by mouth in the morning.     valACYclovir (VALTREX) 500 MG tablet Take 500 mg by mouth in the morning. (Patient not taking: Reported on 07/30/2022)     No current facility-administered medications for this visit.     REVIEW OF SYSTEMS: On review of systems, the patient reports that he is doing well overall. He had a Mohs' surgery of his cheek and has a dressing over this site. He reports he is not short of breath with light activity but does note this with strenuous activity like exercise. He denise any cough or hemoptysis, chest pain, or tightness. He denies any unintended weight loss.        PHYSICAL EXAM:  Wt Readings from Last 3 Encounters:  07/30/22 197 lb (89.4 kg)  07/18/22 202 lb (91.6 kg)  07/09/22 205 lb 3.2 oz (93.1 kg)   Temp Readings from Last 3 Encounters:  07/30/22 97.9 F (36.6 C) (Oral)  07/09/22 97.8 F (36.6 C)  07/04/22 97.9 F (36.6 C)   BP Readings from Last 3 Encounters:  07/30/22 (!) 114/58  07/18/22 128/74  07/09/22 (!) 95/55   Pulse Readings from Last 3 Encounters:  07/30/22 60  07/18/22 (!) 51  07/09/22 67    In general this is a well appearing caucasian male in no acute distress. He has a dressing over his surgical site in the right cheek. He's alert and oriented x4 and appropriate throughout the examination. Cardiopulmonary assessment  is negative for acute distress and he exhibits normal effort.     ECOG = 0  0 - Asymptomatic (Fully active, able to carry on all predisease activities without restriction)  1 -  Symptomatic but completely ambulatory (Restricted in physically strenuous activity but ambulatory and able to carry out work of a light or sedentary nature. For example, light housework, office work)  2 - Symptomatic, <50% in bed during the day (Ambulatory and capable of all self care but unable to carry out any work activities. Up and about more than 50% of waking hours)  3 - Symptomatic, >50% in bed, but not bedbound (Capable of only limited self-care, confined to bed or chair 50% or more of waking hours)  4 - Bedbound (Completely disabled. Cannot carry on any self-care. Totally confined to bed or chair)  5 - Death   Santiago Glad MM, Creech RH, Tormey DC, et al. [phone]). "Toxicity and response criteria of the [facility]". Am. Evlyn Clines. Oncol. 5 (6): 649-55    LABORATORY DATA:  Lab Results  Component Value Date   WBC 6.9 06/19/2022   HGB 11.8 (L) 06/19/2022   HCT 37.3 (L) 06/19/2022   MCV 100.0 06/19/2022   PLT 207 06/19/2022   Lab Results  Component Value Date   NA 139 06/19/2022   K 4.0 06/19/2022   CL 103 06/19/2022   CO2 25 06/19/2022   Lab Results  Component Value Date   ALT 19 01/23/2022   AST 22 01/23/2022   ALKPHOS 85 01/23/2022   BILITOT 0.6 01/23/2022      RADIOGRAPHY: CT Chest Wo Contrast  Result Date: 08/02/2022 CLINICAL DATA:  Follow-up lung nodule. EXAM: CT CHEST WITHOUT CONTRAST TECHNIQUE: Multidetector CT imaging of the chest was performed following the standard protocol without IV contrast. RADIATION DOSE REDUCTION: This exam was performed according to the departmental dose-optimization program which includes automated exposure control, adjustment of the mA and/or kV according to patient size and/or use of iterative reconstruction technique. COMPARISON:   06/04/2022 FINDINGS: Cardiovascular: Normal heart size. No pericardial effusion. Aortic atherosclerosis and coronary artery calcifications. Mediastinum/Nodes: No enlarged mediastinal or axillary lymph nodes. Thyroid gland, trachea, and esophagus demonstrate no significant findings. Lungs/Pleura: Emphysema. Scattered areas of interstitial reticulation identified bilaterally. The tracer avid index nodule within the superior segment of the right lower lobe measures 1.9 by 1.5 by 1.6 cm, image 59/4. On the previous exam this measured 1.4 x 0.9 x 1.0 cm. On the study from 02/25/2022 this nodule measured 0.9 x 0.6 by 1.0 cm. Nodule within the anterior left upper lobe is stable measuring 5 mm, image 31/3. Upper Abdomen: No acute abnormality. There is a cyst arising off the upper pole of right kidney measuring 2.1 cm. No follow-up imaging recommended Musculoskeletal: No chest wall mass or suspicious bone lesions identified. IMPRESSION: 1. Continued interval increase in size of tracer avid nodule within the superior segment of the right lower lobe. This remains concerning for primary bronchogenic carcinoma. 2. Stable 5 mm left upper lobe lung nodule. 3. Coronary artery calcifications. 4. Aortic Atherosclerosis (ICD10-I70.0) and Emphysema (ICD10-J43.9). Electronically Signed   By: Signa Kell M.D.   On: 08/02/2022 14:24       IMPRESSION/PLAN: 1. Putative Stage IA2, cT1bN0M0, NSCLC of the RLL. Today we spent time discussing the patient's work up to date including his most recent CT imaging. He has met with Dr. Cliffton Asters and may still be a surgical candidate pending cardiology work up with stress testing. If he decides to forgo surgery, I have offered stereotactic body radiotherapy (SBRT) as an alternative.  We discussed the risks, benefits, short, and long term effects of radiotherapy, as well as the curative intent,  delivery  and logistics of radiotherapy and between 3-5 fractions of radiotherapy if he were to proceed.   After discussion of options today, the patient and his wife indicated that they would like to proceed with stereotactic body radiation treatment to the solitary right lower lobe tumor which is consistent with a stage I non-small cell lung cancer.  We reviewed once again that repeat biopsy was not recommended in multidisciplinary conference and given the further enlargement of this tumor on CT imaging, it is recommended to proceed with treatment at this time.  All of his questions were answered and he will be scheduled for simulation in the near future for treatment planning.  In a visit lasting 30 minutes, greater than 50% of the time was spent face to face discussing the patient's condition, in preparation for the discussion, and coordinating the patient's care.    ________________________________   Radene Gunning, MD, PhD    **Disclaimer: This note was dictated with voice recognition software. Similar sounding words can inadvertently be transcribed and this note may contain transcription errors which may not have been corrected upon publication of note.**

## 2022-08-06 ENCOUNTER — Ambulatory Visit
Admission: RE | Admit: 2022-08-06 | Discharge: 2022-08-06 | Disposition: A | Discharge status: Home / Self Care | Payer: Medicare Other | Source: Ambulatory Visit | Attending: Radiation Oncology | Admitting: Radiation Oncology

## 2022-08-06 DIAGNOSIS — I4891 Unspecified atrial fibrillation: Secondary | ICD-10-CM | POA: Insufficient documentation

## 2022-08-06 DIAGNOSIS — K4090 Unilateral inguinal hernia, without obstruction or gangrene, not specified as recurrent: Secondary | ICD-10-CM | POA: Insufficient documentation

## 2022-08-06 DIAGNOSIS — K219 Gastro-esophageal reflux disease without esophagitis: Secondary | ICD-10-CM | POA: Diagnosis not present

## 2022-08-06 DIAGNOSIS — G629 Polyneuropathy, unspecified: Secondary | ICD-10-CM | POA: Diagnosis not present

## 2022-08-06 DIAGNOSIS — E041 Nontoxic single thyroid nodule: Secondary | ICD-10-CM | POA: Insufficient documentation

## 2022-08-06 DIAGNOSIS — I2510 Atherosclerotic heart disease of native coronary artery without angina pectoris: Secondary | ICD-10-CM | POA: Insufficient documentation

## 2022-08-06 DIAGNOSIS — Z8521 Personal history of malignant neoplasm of larynx: Secondary | ICD-10-CM | POA: Insufficient documentation

## 2022-08-06 DIAGNOSIS — C3431 Malignant neoplasm of lower lobe, right bronchus or lung: Secondary | ICD-10-CM | POA: Insufficient documentation

## 2022-08-06 DIAGNOSIS — Z79624 Long term (current) use of inhibitors of nucleotide synthesis: Secondary | ICD-10-CM | POA: Insufficient documentation

## 2022-08-06 DIAGNOSIS — J439 Emphysema, unspecified: Secondary | ICD-10-CM | POA: Diagnosis not present

## 2022-08-06 DIAGNOSIS — Z79899 Other long term (current) drug therapy: Secondary | ICD-10-CM | POA: Diagnosis not present

## 2022-08-06 DIAGNOSIS — I700 Atherosclerosis of aorta: Secondary | ICD-10-CM | POA: Diagnosis not present

## 2022-08-06 DIAGNOSIS — Z9481 Bone marrow transplant status: Secondary | ICD-10-CM | POA: Diagnosis not present

## 2022-08-06 DIAGNOSIS — Z79890 Hormone replacement therapy (postmenopausal): Secondary | ICD-10-CM | POA: Diagnosis not present

## 2022-08-06 DIAGNOSIS — Z923 Personal history of irradiation: Secondary | ICD-10-CM | POA: Insufficient documentation

## 2022-08-06 DIAGNOSIS — I714 Abdominal aortic aneurysm, without rupture, unspecified: Secondary | ICD-10-CM | POA: Insufficient documentation

## 2022-08-06 DIAGNOSIS — J849 Interstitial pulmonary disease, unspecified: Secondary | ICD-10-CM | POA: Insufficient documentation

## 2022-08-06 DIAGNOSIS — Z85828 Personal history of other malignant neoplasm of skin: Secondary | ICD-10-CM | POA: Insufficient documentation

## 2022-08-06 DIAGNOSIS — Z87891 Personal history of nicotine dependence: Secondary | ICD-10-CM | POA: Insufficient documentation

## 2022-08-06 DIAGNOSIS — E785 Hyperlipidemia, unspecified: Secondary | ICD-10-CM | POA: Diagnosis not present

## 2022-08-06 DIAGNOSIS — E039 Hypothyroidism, unspecified: Secondary | ICD-10-CM | POA: Diagnosis not present

## 2022-08-06 DIAGNOSIS — Z7901 Long term (current) use of anticoagulants: Secondary | ICD-10-CM | POA: Diagnosis not present

## 2022-08-06 NOTE — Progress Notes (Signed)
Patient here to discuss results from recent CT Chest and treatment options.    Thoracic Location of Tumor / Histology: Right Lower Lobe Lung  CT Chest 08/02/2022: The tracer avid index nodule within the superior segment of the right lower lobe measures 1.9 by 1.5 by 1.6 cm. On the previous exam this measured 1.4 x 0.9 x 1.0 cm. On the study from 02/25/2022 this nodule measured 0.9 x 0.6 by 1.0 cm. Nodule within the anterior left upper lobe is stable measuring 5 mm.  Past/Anticipated interventions by cardiothoracic surgery, if any:  Dr. Cliffton Asters 07/25/2022 -I explained to him that due to the previous operation with all the bleeding, I am concerned that he may have significant scar tissue. -We talked about several options including a robotic assisted segmentectomy versus wedge resection versus SBRT.  -They are unsure as to how to proceed. I think that with his lung function and comorbidities, I explained to him that SBRT would offer the least risk.  -They will call us back to let us know their decision.

## 2022-08-08 ENCOUNTER — Ambulatory Visit
Admission: RE | Admit: 2022-08-08 | Discharge: 2022-08-08 | Disposition: A | Discharge status: Home / Self Care | Payer: Medicare Other | Source: Ambulatory Visit | Attending: Radiation Oncology | Admitting: Radiation Oncology

## 2022-08-08 DIAGNOSIS — C3432 Malignant neoplasm of lower lobe, left bronchus or lung: Secondary | ICD-10-CM | POA: Diagnosis present

## 2022-08-08 DIAGNOSIS — Z510 Encounter for antineoplastic radiation therapy: Secondary | ICD-10-CM | POA: Diagnosis not present

## 2022-08-13 ENCOUNTER — Telehealth: Payer: Self-pay | Admitting: Radiation Oncology

## 2022-08-13 NOTE — Telephone Encounter (Signed)
Pt wife called about needing an appt changed. Message sent to treatment planning team via email to c/b asap.

## 2022-08-19 DIAGNOSIS — Z510 Encounter for antineoplastic radiation therapy: Secondary | ICD-10-CM | POA: Diagnosis not present

## 2022-08-20 ENCOUNTER — Ambulatory Visit: Payer: Medicare Other | Admitting: Radiation Oncology

## 2022-08-21 ENCOUNTER — Other Ambulatory Visit: Payer: Self-pay

## 2022-08-21 ENCOUNTER — Ambulatory Visit: Payer: Medicare Other | Admitting: Radiation Oncology

## 2022-08-21 ENCOUNTER — Ambulatory Visit
Admission: RE | Admit: 2022-08-21 | Discharge: 2022-08-21 | Disposition: A | Discharge status: Home / Self Care | Payer: Medicare Other | Source: Ambulatory Visit | Attending: Radiation Oncology | Admitting: Radiation Oncology

## 2022-08-21 DIAGNOSIS — Z510 Encounter for antineoplastic radiation therapy: Secondary | ICD-10-CM | POA: Diagnosis not present

## 2022-08-21 LAB — RAD ONC ARIA SESSION SUMMARY
Course Elapsed Days: 0
Plan Fractions Treated to Date: 1
Plan Prescribed Dose Per Fraction: 18 Gy
Plan Total Fractions Prescribed: 3
Plan Total Prescribed Dose: 54 Gy
Reference Point Dosage Given to Date: 18 Gy
Reference Point Session Dosage Given: 18 Gy
Session Number: 1

## 2022-08-22 ENCOUNTER — Ambulatory Visit: Payer: Medicare Other | Admitting: Radiation Oncology

## 2022-08-25 NOTE — Telephone Encounter (Signed)
erroneous

## 2022-08-26 ENCOUNTER — Other Ambulatory Visit: Payer: Self-pay

## 2022-08-26 ENCOUNTER — Ambulatory Visit: Payer: Medicare Other | Admitting: Radiation Oncology

## 2022-08-26 ENCOUNTER — Ambulatory Visit
Admission: RE | Admit: 2022-08-26 | Discharge: 2022-08-26 | Disposition: A | Discharge status: Home / Self Care | Payer: Medicare Other | Source: Ambulatory Visit | Attending: Radiation Oncology | Admitting: Radiation Oncology

## 2022-08-26 DIAGNOSIS — Z510 Encounter for antineoplastic radiation therapy: Secondary | ICD-10-CM | POA: Diagnosis not present

## 2022-08-26 LAB — RAD ONC ARIA SESSION SUMMARY
Course Elapsed Days: 5
Plan Fractions Treated to Date: 2
Plan Prescribed Dose Per Fraction: 18 Gy
Plan Total Fractions Prescribed: 3
Plan Total Prescribed Dose: 54 Gy
Reference Point Dosage Given to Date: 36 Gy
Reference Point Session Dosage Given: 18 Gy
Session Number: 2

## 2022-08-27 ENCOUNTER — Ambulatory Visit: Payer: Medicare Other | Admitting: Radiation Oncology

## 2022-08-28 ENCOUNTER — Ambulatory Visit: Payer: Medicare Other | Admitting: Radiation Oncology

## 2022-08-28 ENCOUNTER — Ambulatory Visit
Admission: RE | Admit: 2022-08-28 | Discharge: 2022-08-28 | Disposition: A | Discharge status: Home / Self Care | Payer: Medicare Other | Source: Ambulatory Visit | Attending: Radiation Oncology | Admitting: Radiation Oncology

## 2022-08-28 ENCOUNTER — Other Ambulatory Visit: Payer: Self-pay

## 2022-08-28 DIAGNOSIS — Z510 Encounter for antineoplastic radiation therapy: Secondary | ICD-10-CM | POA: Diagnosis not present

## 2022-08-28 LAB — RAD ONC ARIA SESSION SUMMARY
Course Elapsed Days: 7
Plan Fractions Treated to Date: 3
Plan Prescribed Dose Per Fraction: 18 Gy
Plan Total Fractions Prescribed: 3
Plan Total Prescribed Dose: 54 Gy
Reference Point Dosage Given to Date: 54 Gy
Reference Point Session Dosage Given: 18 Gy
Session Number: 3

## 2022-08-29 ENCOUNTER — Ambulatory Visit: Payer: Medicare Other | Admitting: Radiation Oncology

## 2022-09-01 ENCOUNTER — Ambulatory Visit: Payer: Medicare Other | Admitting: Radiation Oncology

## 2022-09-02 NOTE — Radiation Completion Notes (Addendum)
  Radiation Oncology         [phone]) [phone] ________________________________  Name: Peter Greene MRN: [id number]  Date of Service: 08/28/2022  DOB: 27-Nov-1940  End of Treatment Note  Diagnosis: Putative Stage IA2, cT1bN0M0, NSCLC of the RLL.   Intent: Curative     ==========DELIVERED PLANS==========  First Treatment Date: 2022-08-21 - Last Treatment Date: 2022-08-28   Plan Name: Lung_R_SBRT Site: Lung, Right Technique: SBRT/SRT-IMRT Mode: Photon Dose Per Fraction: 18 Gy Prescribed Dose (Delivered / Prescribed): 54 Gy / 54 Gy Prescribed Fxs (Delivered / Prescribed): 3 / 3     ==========ON TREATMENT VISIT DATES========== 2022-08-21, 2022-08-26, 2022-08-28, 2022-08-28  See weekly On Treatment Notes in Epic for details.The patient tolerated radiation. He developed fatigue and anticipated skin changes in the treatment field.   The patient will receive a call in about one month from the radiation oncology department. I will order a CT scan to be performed in 6-8 weeks time and if stable anticipate scans at 6 month intervals per NCCN Guidelines.     Osker Mason, PAC

## 2022-09-05 ENCOUNTER — Other Ambulatory Visit: Payer: Self-pay | Admitting: Radiation Oncology

## 2022-09-05 DIAGNOSIS — C3431 Malignant neoplasm of lower lobe, right bronchus or lung: Secondary | ICD-10-CM

## 2022-09-09 ENCOUNTER — Encounter (HOSPITAL_BASED_OUTPATIENT_CLINIC_OR_DEPARTMENT_OTHER): Payer: Self-pay

## 2022-09-09 ENCOUNTER — Ambulatory Visit (HOSPITAL_BASED_OUTPATIENT_CLINIC_OR_DEPARTMENT_OTHER)
Admission: RE | Admit: 2022-09-09 | Discharge: 2022-09-09 | Disposition: A | Discharge status: Home / Self Care | Payer: Medicare Other | Source: Ambulatory Visit | Attending: Radiation Oncology | Admitting: Radiation Oncology

## 2022-09-09 DIAGNOSIS — C3431 Malignant neoplasm of lower lobe, right bronchus or lung: Secondary | ICD-10-CM | POA: Diagnosis present

## 2022-09-09 MED ORDER — IOHEXOL 300 MG/ML  SOLN
100.0000 mL | Freq: Once | INTRAMUSCULAR | Status: AC | PRN
  Administered 2022-09-09: 75 mL via INTRAVENOUS

## 2022-09-16 ENCOUNTER — Telehealth: Payer: Self-pay | Admitting: Radiation Oncology

## 2022-09-16 DIAGNOSIS — C3431 Malignant neoplasm of lower lobe, right bronchus or lung: Secondary | ICD-10-CM

## 2022-09-16 NOTE — Telephone Encounter (Signed)
I called the patient to review his post treatment CT scan results. We will plan to move to 6 month follow up.

## 2022-11-03 ENCOUNTER — Ambulatory Visit
Admission: RE | Admit: 2022-11-03 | Discharge: 2022-11-03 | Disposition: A | Discharge status: Home / Self Care | Payer: Medicare Other | Source: Ambulatory Visit | Attending: Radiation Oncology | Admitting: Radiation Oncology

## 2022-11-03 NOTE — Progress Notes (Signed)
  Radiation Oncology         [phone]) [phone] ________________________________  Name: Peter Greene MRN: [id number]  Date of Service: 11/03/2022  DOB: 1940/04/01  Post Treatment Telephone Note  Diagnosis:  Putative Stage IA2, cT1bN0M0, NSCLC of the RLL.   (as documented in provider EOT note)   The patient was not available for call today. Voicemail left.  The patient will follow up w/ the radiation dept.for ongoing care PRN, and was encouraged to call if he develops concerns or questions regarding radiation.   Ruel Favors, LPN

## 2022-12-10 ENCOUNTER — Other Ambulatory Visit: Payer: Self-pay | Admitting: Registered Nurse

## 2022-12-10 ENCOUNTER — Ambulatory Visit
Admission: RE | Admit: 2022-12-10 | Discharge: 2022-12-10 | Disposition: A | Discharge status: Home / Self Care | Payer: Medicare Other | Source: Ambulatory Visit | Attending: Registered Nurse | Admitting: Registered Nurse

## 2022-12-10 DIAGNOSIS — D469 Myelodysplastic syndrome, unspecified: Secondary | ICD-10-CM

## 2022-12-10 DIAGNOSIS — J849 Interstitial pulmonary disease, unspecified: Secondary | ICD-10-CM

## 2022-12-10 DIAGNOSIS — J189 Pneumonia, unspecified organism: Secondary | ICD-10-CM

## 2022-12-10 DIAGNOSIS — R9389 Abnormal findings on diagnostic imaging of other specified body structures: Secondary | ICD-10-CM

## 2022-12-10 MED ORDER — IOPAMIDOL (ISOVUE-300) INJECTION 61%
200.0000 mL | Freq: Once | INTRAVENOUS | Status: AC | PRN
  Administered 2022-12-10: 75 mL via INTRAVENOUS

## 2022-12-17 ENCOUNTER — Ambulatory Visit (INDEPENDENT_AMBULATORY_CARE_PROVIDER_SITE_OTHER): Payer: Medicare Other | Admitting: Pulmonary Disease

## 2022-12-17 ENCOUNTER — Encounter: Payer: Self-pay | Admitting: Pulmonary Disease

## 2022-12-17 VITALS — BP 110/60 | HR 79 | Temp 97.8°F | Ht 68.0 in | Wt 200.4 lb

## 2022-12-17 DIAGNOSIS — C3431 Malignant neoplasm of lower lobe, right bronchus or lung: Secondary | ICD-10-CM

## 2022-12-17 DIAGNOSIS — J849 Interstitial pulmonary disease, unspecified: Secondary | ICD-10-CM

## 2022-12-17 DIAGNOSIS — R911 Solitary pulmonary nodule: Secondary | ICD-10-CM | POA: Diagnosis not present

## 2022-12-17 NOTE — Progress Notes (Signed)
Peter Greene    [id number]    1940-08-12  Primary Care Physician:Aronson, Gerlene Burdock, MD  Referring Physician: Geoffry Paradise, MD [address] [place],  [place] [address]   Chief complaint: Follow up for interstitial lung disease, lung nodule  HPI: 82 y.o. with history of laryngeal cancer, atrial fibrillation, myelodysplastic syndrome.  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  Used to follow with Dr.Thomas J. Tedra Coupe, pulmonary in [place] and was told that he had idiopathic pneumonia treated with a prolonged steroid taper.  Reviewed notes from Dr. Timothy Lasso who did not have much details on the note but he is being treated for idiopathic pneumonia with prolonged steroid taper.  There is no mention of biopsy. He appears to have had surgical lung biopsy in the past but the patient does not recall the date of the procedure.  He is currently not on any specific treatments for interstitial lung disease and is transferring care as he moved [place] to be closer to his family.  He was hospitalized in the middle of May 2023 with sepsis, community-acquired pneumonia with CT scan showing focal consolidations from baseline vesicular.  Years.  Treated with Rocephin, Azithromycin and sent home on Augmentin with a steroid taper.  Overall he feels improved and back to baseline  Pets: No pets Occupation: Retired Firefighter Exposures: No mold exposures.  No mold, hot tub, Jacuzzi.  No feather pillows or comforters ILD questionnaire 08/20/2021-negative Smoking history: 20-pack-year smoker.  Quit in 1984 Travel history: Used to live in [place], [place].  No significant recent travel Relevant family history: No family history of lung disease  Interim history: He is now being followed for an enlarging right  lower lobe nodule with positive PET signal Underwent navigational bronchoscopy twice in January and March 2024 to which unfortunately was non diagnostic.  Case has been discussed at tumor board and evaluated by cardiothoracic surgery, radiation oncology.  He eventually underwent empiric SBRT to that area in June 2024.  He had a diagnosis of pneumonia about a month ago at his primary care office. The pneumonia was treated with Levofloxacin and a Z-Pak, after which he felt better. However, a follow-up x-ray reportedly looked worse than the initial one, prompting a CT scan.   Outpatient Encounter Medications as of 12/17/2022  Medication Sig   acetaminophen (TYLENOL) 500 MG tablet Take 500 mg by mouth every 6 (six) hours as needed for moderate pain.   aspirin EC 81 MG tablet Take 81 mg by mouth in the morning. Swallow whole.   atorvastatin (LIPITOR) 40 MG tablet Take 40 mg by mouth in the morning.   ELIQUIS 5 MG TABS tablet Take 5 mg by mouth 2 (two) times daily.   ipratropium (ATROVENT) 0.03 % nasal spray Place 2 sprays into both nostrils daily.   Krill Oil 350 MG CAPS Take 350 mg by mouth every evening.   levothyroxine (SYNTHROID) 25 MCG tablet Take 25 mcg by mouth every evening.   Melatonin 10 MG TABS Take by mouth daily as needed (sleep).   metoprolol tartrate (LOPRESSOR) 25 MG tablet Take 25 mg by mouth 2 (two) times daily.   Multiple Vitamin (MULTIVITAMIN WITH MINERALS) TABS tablet Take 1 tablet by mouth in the morning. Centrum   pantoprazole (PROTONIX) 20 MG tablet Take 20 mg by mouth every  evening.   tamsulosin (FLOMAX) 0.4 MG CAPS capsule Take 0.4 mg by mouth in the morning.   valACYclovir (VALTREX) 500 MG tablet Take 500 mg by mouth in the morning.   [DISCONTINUED] solifenacin (VESICARE) 5 MG tablet Take 5 mg by mouth daily.   No facility-administered encounter medications on file as of 12/17/2022.   Physical Exam: Blood pressure 110/60, pulse 79, temperature 97.8 F (36.6 C),  temperature source Oral, height 5\' 8"  (1.727 m), weight 200 lb 6.4 oz (90.9 kg), SpO2 94%. Gen:      No acute distress HEENT:  EOMI, sclera anicteric Neck:     No masses; no thyromegaly Lungs:    Clear to auscultation bilaterally; normal respiratory effort CV:         Regular rate and rhythm; no murmurs Abd:      + bowel sounds; soft, non-tender; no palpable masses, no distension Ext:    No edema; adequate peripheral perfusion Skin:      Warm and dry; no rash Neuro: alert and oriented x 3 Psych: normal mood and affect   Data Reviewed: Imaging: CT chest 08/11/2021-multifocal areas of consolidation mostly in the right middle lobe and lower lobes.  Heterogeneous peripheral reticulation.    High resolution CT 02/25/2022-upper lobe reticular groundglass opacities with honeycomb changes, alternate diagnosis.  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  PET scan 03/23/2022-hypermetabolic right lower lobe nodule  CT scan 06/05/2022-enlargement of spiculated right lower lobe nodule, stable scarring in the lungs. I have reviewed the images below.  PFTs: 01/01/2022 FVC 2.54 [6 7%], FEV1 2.19 [81%], F/F 86, TLC 4.16 [62%], DLCO 13.02 [56%]  06/11/2022 FVC 2.75 [73%], FEV1 2.35 [88%], F/F85, TLC 5.13 [77%], DLCO 13.62 [59%] Moderate restriction, diffusion defect.  Labs: CTD serologies 06/28/2022-significant for rheumatoid factor 16, hypersensitive panel- negative  Assessment:  Pneumonia Recently treated with levofloxacin and Z-Pak for pneumonia CT follow-up on 9/11 shows right lung infiltrates which is read as radiation pneumonitis but his prior scan in June after XRT did not show these changes.  It could be residual pneumonia which will need follow-up Order CT scan in 8 weeks  Lung malignancy Enlarged lymph node concerning for malignancy due to PET avidity and enlargement in size. S/p SBRT.  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 He had apparently been treated by prolonged steroid taper by his pulmonologist in [place].  He is currently not on treatment now.  PFTs reviewed with restriction and diffusion impairment however they have improved in February 2024 compared to 2023.  Will continue to follow this for now.  Postnasal drip Flonase can use over-the-counter Allegra or antihistamine such as Xyzal or Benadryl.  Plan/Recommendations: CT scan in 8 weeks Return to clinic after CT scan  Chilton Greathouse MD [facility] 12/17/2022, 2:42 PM  CC: Geoffry Paradise, MD

## 2022-12-17 NOTE — Patient Instructions (Signed)
Will order a follow-up CT scan without contrast in 8 weeks time Return to clinic after 8 weeks for review and plan for next labs

## 2023-02-02 ENCOUNTER — Ambulatory Visit
Admission: RE | Admit: 2023-02-02 | Discharge: 2023-02-02 | Disposition: A | Discharge status: Home / Self Care | Payer: Medicare Other | Source: Ambulatory Visit | Attending: Pulmonary Disease | Admitting: Pulmonary Disease

## 2023-02-02 DIAGNOSIS — C3431 Malignant neoplasm of lower lobe, right bronchus or lung: Secondary | ICD-10-CM

## 2023-02-11 ENCOUNTER — Ambulatory Visit (INDEPENDENT_AMBULATORY_CARE_PROVIDER_SITE_OTHER): Payer: Medicare Other | Admitting: Pulmonary Disease

## 2023-02-11 ENCOUNTER — Encounter: Payer: Self-pay | Admitting: Pulmonary Disease

## 2023-02-11 ENCOUNTER — Other Ambulatory Visit: Payer: Self-pay | Admitting: Radiation Oncology

## 2023-02-11 VITALS — BP 100/68 | HR 54 | Ht 68.0 in | Wt 200.0 lb

## 2023-02-11 DIAGNOSIS — R911 Solitary pulmonary nodule: Secondary | ICD-10-CM

## 2023-02-11 DIAGNOSIS — J849 Interstitial pulmonary disease, unspecified: Secondary | ICD-10-CM | POA: Diagnosis not present

## 2023-02-11 DIAGNOSIS — C3431 Malignant neoplasm of lower lobe, right bronchus or lung: Secondary | ICD-10-CM

## 2023-02-11 NOTE — Progress Notes (Signed)
Peter Greene    [id number]    1941-03-15  Primary Care Physician:Aronson, Gerlene Burdock, MD  Referring Physician: Geoffry Paradise, MD [address] [place],  [place] [address]   Chief complaint: Follow up for interstitial lung disease, lung nodule  HPI: 82 y.o. with history of laryngeal cancer, atrial fibrillation, myelodysplastic syndrome.  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  Used to follow with Dr.Thomas J. Tedra Coupe, pulmonary in [place] and was told that he had idiopathic pneumonia treated with a prolonged steroid taper.  Reviewed notes from Dr. Timothy Lasso who did not have much details on the note but he is being treated for idiopathic pneumonia with prolonged steroid taper.  There is no mention of biopsy. He appears to have had surgical lung biopsy in the past but the patient does not recall the date of the procedure.  He is currently not on any specific treatments for interstitial lung disease and is transferring care as he moved [place] to be closer to his family.  He was hospitalized in the middle of May 2023 with sepsis, community-acquired pneumonia with CT scan showing focal consolidations from baseline vesicular.  Years.  Treated with Rocephin, Azithromycin and sent home on Augmentin with a steroid taper.  Overall he feels improved and back to baseline.  He is now being followed for an enlarging right lower lobe nodule with positive PET signal Underwent navigational bronchoscopy twice in January and March 2024 to which unfortunately was non diagnostic.  Case has been discussed at tumor board and evaluated by cardiothoracic surgery, radiation oncology.  He eventually underwent empiric SBRT to that area in June 2024.  Pets: No pets Occupation: Retired Firefighter Exposures: No mold  exposures.  No mold, hot tub, Jacuzzi.  No feather pillows or comforters ILD questionnaire 08/20/2021-negative Smoking history: 20-pack-year smoker.  Quit in 1984 Travel history: Used to live in [place], [place].  No significant recent travel Relevant family history: No family history of lung disease  Interim history: Discussed the use of AI scribe software for clinical note transcription with the patient, who gave verbal consent to proceed.  The patient, with a history of radiation therapy for a right lung nodule, presents with ongoing sinus congestion and cough. He describes the congestion as severe, requiring a box of tissues daily, and it has been ongoing for approximately four months. He has been coughing up sputum every half hour, which he attributes to the sinus congestion. He has tried Careers adviser and a nasal spray without significant relief.  In addition, the patient mentions neuropathy, which is manageable with comfortable shoes. He remains active, playing golf and working out regularly. He notes that he can still ride a bike for 30 minutes, although not at the same intensity as before.   He is here for review of recent CT scan.  Outpatient Encounter Medications as of 02/11/2023  Medication Sig   acetaminophen (TYLENOL) 500 MG tablet Take 500 mg by mouth every 6 (six) hours as needed for moderate pain.   aspirin EC 81 MG tablet Take 81 mg by mouth in the morning. Swallow whole.   atorvastatin (LIPITOR) 40 MG tablet Take 40 mg by mouth in the morning.   ELIQUIS 5 MG TABS tablet Take 5 mg by mouth 2 (two) times daily.   ipratropium (ATROVENT) 0.03 % nasal  spray Place 2 sprays into both nostrils daily.   Krill Oil 350 MG CAPS Take 350 mg by mouth every evening.   levothyroxine (SYNTHROID) 25 MCG tablet Take 25 mcg by mouth every evening.   Melatonin 10 MG TABS Take by mouth daily as needed (sleep).   metoprolol tartrate (LOPRESSOR) 25 MG tablet Take 25 mg by mouth 2 (two) times daily.    Multiple Vitamin (MULTIVITAMIN WITH MINERALS) TABS tablet Take 1 tablet by mouth in the morning. Centrum   pantoprazole (PROTONIX) 20 MG tablet Take 20 mg by mouth every evening.   tamsulosin (FLOMAX) 0.4 MG CAPS capsule Take 0.4 mg by mouth in the morning.   valACYclovir (VALTREX) 500 MG tablet Take 500 mg by mouth in the morning.   No facility-administered encounter medications on file as of 02/11/2023.   Physical Exam: Blood pressure 100/68, pulse (!) 54, height 5\' 8"  (1.727 m), weight 200 lb (90.7 kg), SpO2 94%. Gen:      No acute distress HEENT:  EOMI, sclera anicteric Neck:     No masses; no thyromegaly Lungs:    Clear to auscultation bilaterally; normal respiratory effort CV:         Regular rate and rhythm; no murmurs Abd:      + bowel sounds; soft, non-tender; no palpable masses, no distension Ext:    No edema; adequate peripheral perfusion Skin:      Warm and dry; no rash Neuro: alert and oriented x 3 Psych: normal mood and affect   Data Reviewed: Imaging: CT chest 08/11/2021-multifocal areas of consolidation mostly in the right middle lobe and lower lobes.  Heterogeneous peripheral reticulation.    High resolution CT 02/25/2022-upper lobe reticular groundglass opacities with honeycomb changes, alternate diagnosis.  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  PET scan 03/23/2022-hypermetabolic right lower lobe nodule  CT scan 06/05/2022-enlargement of spiculated right lower lobe nodule, stable scarring in the lungs.  CT scan 09/09/2022-decrease in size of solid right lower lobe pulmonary nodule.  Stable nodule in the left upper lobe.  Right upper lobe peribronchovascular reticular changes  CT chest 12/10/2022-right upper lobe lobe and superior right lower lobe interstitial opacities suggestive of postradiation changes.  CT chest 02/02/2023-radiology read is pending.  By my review findings appear stable compared to September 2024 I have reviewed the images below.  PFTs: 01/01/2022 FVC 2.54 [6 7%],  FEV1 2.19 [81%], F/F 86, TLC 4.16 [62%], DLCO 13.02 [56%]  06/11/2022 FVC 2.75 [73%], FEV1 2.35 [88%], F/F85, TLC 5.13 [77%], DLCO 13.62 [59%] Moderate restriction, diffusion defect.  Labs: CTD serologies 06/28/2022-significant for rheumatoid factor 16, hypersensitive panel- negative  Assessment:  Lung malignancy Enlarged lymph node concerning for malignancy due to PET avidity and enlargement in size. S/p SBRT.  Follow-up with radiation oncology.  Will contact them as he has follow-up CT scan pending for next month and can probably get canceled since he got a scan a week ago.  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 New postradiation changes on the right He had apparently been treated by prolonged steroid taper by his pulmonologist in [place].  He is currently not on treatment now.  PFTs reviewed with restriction and diffusion impairment however they have improved in February 2024 compared to 2023.    Now with new pulmonary infiltrate in the right upper and superior segment of the right lower lobe that developed several months after radiation therapy.  Appears to be postradiation changes with no change  from September 2024.  CT scan reviewed in detail with patient.  Will continue monitoring.  -Continue current management. -Await final radiology read. If different from current assessment, will contact patient.  Chronic Sinus Congestion Postnasal drip Persistent despite Allegra and nasal spray use. Recently switched to Xyzal. -Continue Xyzal and nasal spray as directed.  Peripheral Neuropathy Managed with comfortable shoes. -Continue current management.  Follow-up in 6 months.   Plan/Recommendations: Continue monitoring Discussed with radiation oncology about follow-up scan Follow-up in 6 months  Chilton Greathouse MD [facility] 02/11/2023, 10:06 AM  CC:  Geoffry Paradise, MD

## 2023-02-11 NOTE — Patient Instructions (Signed)
VISIT SUMMARY:  During your visit, we discussed your ongoing sinus congestion and cough, which have been persistent for about four months. We also reviewed your history of radiation therapy for a right lung nodule and your current experience with neuropathy. You remain active and manage your neuropathy with comfortable shoes.  YOUR PLAN:  -CHRONIC SINUS CONGESTION: Chronic sinus congestion means that your sinuses are persistently blocked or inflamed, causing symptoms like a runny nose and cough. You have been switched to Xyzal, an antihistamine, and should continue using it along with your nasal spray as directed.  -PULMONARY INFILTRATE: A pulmonary infiltrate refers to a substance denser than air, such as fluid or cells, which lingers within the lung tissues. Your recent CT scan shows stable results, likely due to post-radiation changes. We will continue with the current management plan and await the final radiology report. If there are any changes, we will contact you.  -PERIPHERAL NEUROPATHY: Peripheral neuropathy is a condition that results from damage to the nerves outside of the brain and spinal cord, often causing weakness, numbness, and pain. You are managing this well with comfortable shoes, and no changes to your current management are needed.  INSTRUCTIONS:  Please follow up in 6 months for a re-evaluation. Continue taking Xyzal and using your nasal spray as directed. We will contact you if there are any changes in your radiology report.

## 2023-02-12 ENCOUNTER — Telehealth: Payer: Self-pay | Admitting: *Deleted

## 2023-02-12 NOTE — Telephone Encounter (Signed)
Spoke with the patient to see how he was feeling.  He was noted to have pneumonia on his CT scan and was treated with antibiotics.  He reports he has completed his course of antibiotics and is feeling much better.  Denies cough, SOB, or fevers.  He reports some nasal drainage and he is able to cough this up.  I let him know that since he just had a CT Chest 02/02/2023 we would push our scan out from December to February.  He was informed he would receive a call closer to then from our scheduler.  He verbalized understanding.  Lind Covert RN, BSN

## 2023-02-18 ENCOUNTER — Telehealth: Payer: Self-pay | Admitting: Pulmonary Disease

## 2023-03-09 NOTE — Telephone Encounter (Signed)
Patient would like results of CT scan. Patient phone number is [phone].

## 2023-03-09 NOTE — Telephone Encounter (Signed)
I called and left a voice message.  His CT scan is stable with no new issues.  Will continue monitoring.  Nothing further needed.

## 2023-03-10 ENCOUNTER — Telehealth: Payer: Self-pay | Admitting: *Deleted

## 2023-03-10 NOTE — Telephone Encounter (Signed)
CALLED PATIENT TO INFORM OF CT FOR 05-04-23- ARRIVAL TIME - 11 AM @  [facility], NO RESTRICTIONS TO SCAN, PATIENT TO RECEIVE RESULTS FROM ALISON PERKINS ON 05-11-23 @ 2 PM VIA TELEPHONE, LVM FOR A RETURN CALL

## 2023-03-23 ENCOUNTER — Ambulatory Visit: Payer: Medicare Other | Admitting: Radiation Oncology

## 2023-04-21 ENCOUNTER — Other Ambulatory Visit: Payer: Self-pay | Admitting: Registered Nurse

## 2023-04-21 DIAGNOSIS — K8689 Other specified diseases of pancreas: Secondary | ICD-10-CM

## 2023-04-23 ENCOUNTER — Other Ambulatory Visit: Payer: Self-pay | Admitting: Registered Nurse

## 2023-04-23 ENCOUNTER — Encounter: Payer: Self-pay | Admitting: Registered Nurse

## 2023-04-23 ENCOUNTER — Ambulatory Visit
Admission: RE | Admit: 2023-04-23 | Discharge: 2023-04-23 | Disposition: A | Discharge status: Home / Self Care | Payer: Medicare Other | Source: Ambulatory Visit | Attending: Registered Nurse | Admitting: Registered Nurse

## 2023-04-23 DIAGNOSIS — K8689 Other specified diseases of pancreas: Secondary | ICD-10-CM

## 2023-04-23 MED ORDER — GADOPICLENOL 0.5 MMOL/ML IV SOLN
9.0000 mL | Freq: Once | INTRAVENOUS | Status: AC | PRN
  Administered 2023-04-23: 9 mL via INTRAVENOUS

## 2023-05-04 ENCOUNTER — Ambulatory Visit (HOSPITAL_BASED_OUTPATIENT_CLINIC_OR_DEPARTMENT_OTHER)
Admission: RE | Admit: 2023-05-04 | Discharge: 2023-05-04 | Disposition: A | Discharge status: Home / Self Care | Payer: Medicare Other | Source: Ambulatory Visit | Attending: Radiation Oncology | Admitting: Radiation Oncology

## 2023-05-04 ENCOUNTER — Encounter (HOSPITAL_BASED_OUTPATIENT_CLINIC_OR_DEPARTMENT_OTHER): Payer: Self-pay

## 2023-05-04 DIAGNOSIS — C3431 Malignant neoplasm of lower lobe, right bronchus or lung: Secondary | ICD-10-CM | POA: Insufficient documentation

## 2023-05-04 MED ORDER — IOHEXOL 300 MG/ML  SOLN
100.0000 mL | Freq: Once | INTRAMUSCULAR | Status: AC | PRN
  Administered 2023-05-04: 75 mL via INTRAVENOUS

## 2023-05-08 ENCOUNTER — Telehealth (HOSPITAL_BASED_OUTPATIENT_CLINIC_OR_DEPARTMENT_OTHER): Payer: Self-pay | Admitting: Pulmonary Disease

## 2023-05-08 NOTE — Telephone Encounter (Signed)
 Peter Greene After Hours Telephone Encounter  Received message for "coughed up blood last night and this morning" I have called patient x 2 and no answer. Left voicemail and advised to present to the emergency room for evaluation

## 2023-05-11 ENCOUNTER — Ambulatory Visit
Admission: RE | Admit: 2023-05-11 | Discharge: 2023-05-11 | Disposition: A | Discharge status: Home / Self Care | Payer: Medicare Other | Source: Ambulatory Visit | Attending: Radiation Oncology | Admitting: Radiation Oncology

## 2023-05-11 ENCOUNTER — Encounter: Payer: Self-pay | Admitting: Radiation Oncology

## 2023-05-11 DIAGNOSIS — C3431 Malignant neoplasm of lower lobe, right bronchus or lung: Secondary | ICD-10-CM

## 2023-05-11 NOTE — Progress Notes (Signed)
 Radiation Oncology         ([phone]) [phone] ________________________________  Name: Peter Greene        MRN: [id number]  Date of Service: 05/11/2023 DOB: Nov 10, 1940  NW:GNFAOZH, Rich Champ, MD  Gloriajean Large, MD     REFERRING PHYSICIAN: Gloriajean Large, MD   DIAGNOSIS: The encounter diagnosis was Malignant neoplasm of lower lobe of right lung [employer]).   HISTORY OF PRESENT ILLNESS: Peter Greene is a 83 y.o. male with a diagnosis of putative stage I lung cnacer.  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  He was diagnosed in May 2023 with a community-acquired pneumonia that led to sepsis and was treated with antibiotics and steroids.  He reports a diagnosis as well involving idiopathic pulmonary pneumonitis/fibrosis, he relocated to [place]  and has been in the state for about a year.  He established with pulmonary medicine as a result of his hospitalization in 2023, and aCT of the chest with high-resolution in November 2023 given his history of interstitial lung disease was performed. This showed a nodule in the RLL. He also had additional scattered small pulmonary nodules that were considered unchanged in comparison to prior CT including a left upper lobe nodule measuring 5 mm.  There was a lesion in the right hepatic lobe measuring 2.4 cm.  No evidence of adenopathy was appreciated.  He was counseled on a PET scan to further investigate the lung and liver findings, and this was performed on 03/19/2022.  Hypermetabolic activity was seen in the right lower lobe nodule measuring 10 mm in greatest dimension with an SUV of 4.1.  No other hypermetabolic changes in the lungs or in the region of the lymph nodes were appreciated.  The  lesion in the liver showed low-attenuation measuring 2.7 cm with no metabolic activity favored to be benign.  He has a large fat filled right inguinal hernia on the scan as well.  No other concerns for malignancy were appreciated.  Bronchoscopy  on 04/17/2022 yielded scant cellularity but benign bronchial cells and no malignancy identified.  He was counseled on repeat checking and a CT super D scan prior to his second bronchoscopy on 06/04/2022 showed an increase in the nodule measuring 1.3 cm, and stability in a left upper lobe nodule measuring 6 mm and what appears to be benign findings in the liver and spleen.  He underwent a second bronchoscopy on 06/19/2022, cytology again showed benign bronchial mucosa with chronic inflammation in the aspirate and in the brushings nondiagnostic material no malignancy could be identified.    His case was discussed in thoracic oncology conference, and radiographically remained concerning for early stage I lung cancer in the right lower lobe. He did meet as well with Dr. Deloise Ferries and it was felt that his pulmonary function would potentially suffer further, so he elected to completed stereotactic body radiotherapy (SBRT). He completed this on 08/28/22. His post treatment CT on 09/09/22 showed interval decrease in the RLL nodule, and a stable 5 mm LUL nodule remained. He has had additional scans showing post treatment change in the lung, and again on 02/02/23, confirming post treatment changes in the RLL, and evidence of interstitial lung disease, aortic aneurysm measuring 4.1 cm, pancreatic duct dilitation,  athersclerosis, and enlarged pulmonary trunk. He also had an MRCP on 04/23/23 showing hemangiomatous changes in the liver, and dilation at the main pancreatic duct at the level of the neck, and a cystic change measuring 4 mm. No mass lesion was noted. His routine scan I ordered of the chest was performed on 05/04/23 and showed post radiation change in the right lung iwht no evidence  of recurrence. He did not have new nodules. He did have a trace right pleural effusion, and continued changes of the lungs consistent with interstitial lung disease, and atherosclerotic changes. He's contacted today. In revieing the notes, he did have hemoptysis last week and was encouraged to go to the ED.     PREVIOUS RADIATION THERAPY: Yes   08/21/22-08/28/22: Plan Name: Lung_R_SBRT Site: Lung, RLL Technique: SBRT/SRT-IMRT Mode: Photon Dose Per Fraction: 18 Gy Prescribed Dose (Delivered / Prescribed): 54 Gy / 54 Gy Prescribed Fxs (Delivered / Prescribed): 3 / 3  2020 at [facility] details unknown  2004 Laryngeal Cancer in [place]  details unknown   PAST MEDICAL HISTORY:  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   Hyperlipidemia    atorvastatin    Hypothyroidism    Interstitial lung disease (HCC)    Laryngeal cancer (HCC) 2004   Neuromuscular disorder (HCC)    bilateral feet neuropathy   Pneumonia 07/2021   x 2   Primary cancer of bone marrow (HCC)    Skin cancer    Head   Thyroid disease    Thyroid nodule 2024   Right lower lobe       PAST SURGICAL HISTORY: Past Surgical History:  Procedure Laterality Date   ABDOMINAL AORTIC ANEURYSM REPAIR     BONE MARROW TRANSPLANT     BRONCHIAL BIOPSY  04/17/2022   Procedure: BRONCHIAL BIOPSIES;  Surgeon: Gloriajean Large, MD;  Location: [facility] ENDOSCOPY;  Service: Pulmonary;;   BRONCHIAL BIOPSY  06/19/2022   Procedure: BRONCHIAL BIOPSIES;  Surgeon: Gloriajean Large, MD;  Location: [facility] ENDOSCOPY;  Service: Pulmonary;;   BRONCHIAL BRUSHINGS  04/17/2022   Procedure: BRONCHIAL BRUSHINGS;  Surgeon: Gloriajean Large, MD;  Location: [facility] ENDOSCOPY;  Service: Pulmonary;;   BRONCHIAL  NEEDLE ASPIRATION BIOPSY  04/17/2022   Procedure: BRONCHIAL NEEDLE ASPIRATION BIOPSIES;  Surgeon: Gloriajean Large, MD;  Location: [facility] ENDOSCOPY;  Service: Pulmonary;;   BRONCHIAL NEEDLE ASPIRATION BIOPSY  06/19/2022   Procedure: BRONCHIAL NEEDLE ASPIRATION BIOPSIES;  Surgeon: Gloriajean Large, MD;  Location: [facility] ENDOSCOPY;  Service: Pulmonary;;   BRONCHIAL WASHINGS  04/17/2022   Procedure: BRONCHIAL WASHINGS;  Surgeon: Gloriajean Large, MD;  Location: [facility] ENDOSCOPY;  Service: Pulmonary;;   BRONCHIAL WASHINGS  06/19/2022   Procedure: BRONCHIAL WASHINGS;  Surgeon: Gloriajean Large, MD;  Location: [facility] ENDOSCOPY;  Service: Pulmonary;;   CARDIAC CATHETERIZATION     ENDARTERECTOMY Left 01/30/2022   Procedure: ENDARTERECTOMY CAROTID;  Surgeon: Kayla Part, MD;  Location: [facility] OR;  Service: Vascular;  Laterality: Left;   EYE SURGERY     FIDUCIAL MARKER PLACEMENT  06/19/2022   Procedure: FIDUCIAL MARKER PLACEMENT;  Surgeon: Gloriajean Large, MD;  Location: [facility] ENDOSCOPY;  Service: Pulmonary;;   HERNIA REPAIR     KNEE SURGERY Bilateral  Joint replacements   MOHS SURGERY  2024   x 2 R & L   PATCH ANGIOPLASTY Left 01/30/2022   Procedure: PATCH ANGIOPLASTY WITH Corinna Dickens BIOLOGIC PATCH [id number];  Surgeon: Kayla Part, MD;  Location: [facility] OR;  Service: Vascular;  Laterality: Left;   TONSILLECTOMY  1946   VIDEO BRONCHOSCOPY WITH RADIAL ENDOBRONCHIAL ULTRASOUND  04/17/2022   Procedure: VIDEO BRONCHOSCOPY WITH RADIAL ENDOBRONCHIAL ULTRASOUND;  Surgeon: Gloriajean Large, MD;  Location: [facility] ENDOSCOPY;  Service: Pulmonary;;   VIDEO BRONCHOSCOPY WITH RADIAL ENDOBRONCHIAL ULTRASOUND  06/19/2022   Procedure: VIDEO BRONCHOSCOPY WITH RADIAL ENDOBRONCHIAL ULTRASOUND;  Surgeon: Gloriajean Large, MD;  Location: [facility] ENDOSCOPY;  Service: Pulmonary;;     FAMILY HISTORY: History reviewed. No pertinent family history.   SOCIAL HISTORY:  reports that he quit smoking about 41 years ago. His smoking use  included cigarettes. He has never been exposed to tobacco smoke. He has never used smokeless tobacco. He reports current alcohol  use of about 2.0 standard drinks of alcohol  per week. He reports that he does not use drugs. The patient is married and lives in [place]. He is spending time this spring in [place] .   ALLERGIES: Patient has no known allergies.   MEDICATIONS:  Current Outpatient Medications  Medication Sig Dispense Refill   acetaminophen  (TYLENOL ) 500 MG tablet Take 500 mg by mouth every 6 (six) hours as needed for moderate pain.     aspirin  EC 81 MG tablet Take 81 mg by mouth in the morning. Swallow whole.     atorvastatin  (LIPITOR) 40 MG tablet Take 40 mg by mouth in the morning.     ELIQUIS  5 MG TABS tablet Take 5 mg by mouth 2 (two) times daily.     ipratropium (ATROVENT) 0.03 % nasal spray Place 2 sprays into both nostrils daily.     Krill Oil 350 MG CAPS Take 350 mg by mouth every evening.     levothyroxine  (SYNTHROID ) 25 MCG tablet Take 25 mcg by mouth every evening.     Melatonin 10 MG TABS Take by mouth daily as needed (sleep).     metoprolol  tartrate (LOPRESSOR ) 25 MG tablet Take 25 mg by mouth 2 (two) times daily.     Multiple Vitamin (MULTIVITAMIN WITH MINERALS) TABS tablet Take 1 tablet by mouth in the morning. Centrum     pantoprazole  (PROTONIX ) 20 MG tablet Take 20 mg by mouth every evening.     tamsulosin  (FLOMAX ) 0.4 MG CAPS capsule Take 0.4 mg by mouth in the morning.     valACYclovir  (VALTREX ) 500 MG tablet Take 500 mg by mouth in the morning.     No current facility-administered medications for this encounter.     REVIEW OF SYSTEMS: On review of systems, the patient reports that he is doing well at this time. He's been having upper respiratory symptoms of sinus drainage, and has been coughing. He states that the blood he saw in his sputum last week was only a tiny streak. This has resolved and he has been in touch with his PCP team and knows to be seen in  [place]  if his symptoms do not resolve in the near future. No fevers are noted. No  shortness of breath or chest pain are noted. No other complaints are verbalized.     PHYSICAL EXAM:  Unable to assess due to encounter type.    ECOG = 0  0 - Asymptomatic (Fully active, able to carry on all predisease activities without restriction)  1 -  Symptomatic but completely ambulatory (Restricted in physically strenuous activity but ambulatory and able to carry out work of a light or sedentary nature. For example, light housework, office work)  2 - Symptomatic, <50% in bed during the day (Ambulatory and capable of all self care but unable to carry out any work activities. Up and about more than 50% of waking hours)  3 - Symptomatic, >50% in bed, but not bedbound (Capable of only limited self-care, confined to bed or chair 50% or more of waking hours)  4 - Bedbound (Completely disabled. Cannot carry on any self-care. Totally confined to bed or chair)  5 - Death   Aurea Blossom MM, Creech RH, Tormey DC, et al. [phone]). "Toxicity and response criteria of the [facility]". Am. Hillard Lowes. Oncol. 5 (6): 649-55    LABORATORY DATA:  Lab Results  Component Value Date   WBC 6.9 06/19/2022   HGB 11.8 (L) 06/19/2022   HCT 37.3 (L) 06/19/2022   MCV 100.0 06/19/2022   PLT 207 06/19/2022   Lab Results  Component Value Date   NA 139 06/19/2022   K 4.0 06/19/2022   CL 103 06/19/2022   CO2 25 06/19/2022   Lab Results  Component Value Date   ALT 19 01/23/2022   AST 22 01/23/2022   ALKPHOS 85 01/23/2022   BILITOT 0.6 01/23/2022      RADIOGRAPHY: CT CHEST W CONTRAST Result Date: 05/08/2023 CLINICAL DATA:  Non-small cell lung cancer (NSCLC), monitor S/P SBRT. * Tracking Code: BO * EXAM: CT CHEST WITH CONTRAST TECHNIQUE: Multidetector CT imaging of the chest was performed during intravenous contrast administration. RADIATION DOSE REDUCTION: This exam was performed according to the  departmental dose-optimization program which includes automated exposure control, adjustment of the mA and/or kV according to patient size and/or use of iterative reconstruction technique. CONTRAST:  75mL OMNIPAQUE  IOHEXOL  300 MG/ML  SOLN COMPARISON:  02/02/2023 chest CT FINDINGS: Cardiovascular: Normal heart size. No significant pericardial effusion/thickening. Three-vessel coronary atherosclerosis. Atherosclerotic nonaneurysmal thoracic aorta. Normal caliber pulmonary arteries. No central pulmonary emboli. Mediastinum/Nodes: No significant thyroid nodules. Unremarkable esophagus. No pathologically enlarged axillary, mediastinal or hilar lymph nodes. Lungs/Pleura: No pneumothorax. Trace posterior right pleural effusion. No left pleural effusion. Dense patchy sharply marginated right perihilar lung consolidation with increased volume loss, distortion and bronchiectasis since 02/02/2023 chest CT, compatible with expected evolution of postradiation change. No appreciable residual or recurrent pulmonary nodule in superior segment right lower lobe. No new pulmonary nodules. Upper abdomen: No acute abnormality. Musculoskeletal: No aggressive appearing focal osseous lesions. Moderate thoracic spondylosis. IMPRESSION: 1. Expected evolution of postradiation change in the right perihilar lung. No appreciable residual or recurrent pulmonary nodule in the superior segment right lower lobe. 2. No evidence of metastatic disease in the chest. 3. Trace posterior right pleural effusion. 4. Three-vessel coronary atherosclerosis. 5.  Aortic Atherosclerosis (ICD10-I70.0). Electronically Signed   By: Levell Reach M.D.   On: 05/08/2023 19:54   MR ABDOMEN MRCP W WO CONTAST Result Date: 04/23/2023 CLINICAL DATA:  Pancreatic ductal dilation EXAM: MRI ABDOMEN WITHOUT AND WITH CONTRAST (INCLUDING MRCP) TECHNIQUE: Multiplanar multisequence MR imaging of the abdomen was performed both before and after the administration of intravenous  contrast. Heavily T2-weighted images of the biliary and pancreatic ducts were obtained. Post-processing was applied at the acquisition scanner with concurrent physician supervision which includes 3D reconstructions, MIPs, volume rendered images and/or shaded surface rendering. CONTRAST:  9 mL Vueway  COMPARISON:  CT chest dated 02/02/2023 and multiple priors FINDINGS: Lower chest:  Trace right pleural effusion. Asymmetric elevation of the right hemidiaphragm. Hepatobiliary: Peripheral segment 6 mildly T2 hyperintense lesions measuring 3.2 x 2.1 cm (4:22) demonstrates enhancement pattern typical for hemangioma. Subcentimeter ill-defined focus of arterial enhancement in peripheral segment 8 (16:57), likely flash filling hemangioma or perfusional variation. Multifocal additional punctate arterially enhancing foci, for example within segment 7 (16:70), are also likely perfusional or flash filling hemangiomas. No bile duct dilation. Normal gallbladder. Pancreas: Mildly atrophic pancreas. Dilation of the main duct at the level of the pancreatic neck measures up to 9 mm (8:25). Immediately downstream of this area, there are a few rounded T2 hyperintense cystic foci measuring up to 4 mm (8:25, 3:27). Downstream duct within the pancreatic head is not dilated. No focal pancreatic mass lesion or abnormal enhancement. Spleen: Lobulated nonenhancing cystic focus within the superior spleen measures 2.0 cm (20:53), likely benign. Normal spleen size. Adrenals/Urinary Tract: No adrenal nodules. No suspicious renal masses identified. No evidence of hydronephrosis. Exophytic right upper pole 2.3 cm cyst (15:79) contains layering hemorrhage/protein. No specific follow-up imaging recommended. Stomach/Bowel: Visualized portions within the abdomen are unremarkable. Vascular/Lymphatic: No pathologically enlarged lymph nodes identified. No abdominal aortic aneurysm demonstrated. Aortic atherosclerosis. Postsurgical changes of the aorto  bi-iliac region. Other:  None. Musculoskeletal: No suspicious bone lesions identified. IMPRESSION: 1. Dilation of the main pancreatic duct at the level of the pancreatic neck measures up to 9 mm. Immediately downstream of this area, there are a few rounded T2 hyperintense cystic foci measuring up to 4 mm. No focal pancreatic mass lesion or abnormal enhancement. 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 Follow-up imaging in 2 years can be considered with contrast-enhanced MRI/MRCP based on the patient's goals of care. 2. Trace right pleural effusion. 3. Hepatic hemangiomas. 4.  Aortic Atherosclerosis (ICD10-I70.0). Electronically Signed   By: Limin  Xu M.D.   On: 04/23/2023 11:23       IMPRESSION/PLAN: 1. Putative Stage IA, [id number], NSCLC of the RLL. The patient's imaging was reviewed and radiographically he is felt to be in remission. We will continue to follow up with him in 6 month intervals with CT scans at those times. If he were to have scans for other reasons prior to this, I encouraged him to call so we can consolidate his efforts for duplicate testing.  2. Interstitial lung disease. The patient will continue to follow up with Dr. Waylan Haggard.  3. Hemoptysis. This appears to have been isolated to allergy or viral symptoms, and he was encouraged to be evaluated if this returned.  4. Pancreatic duct dilatation. He will follow up with his providers at [facility] to determine if he needs additional work up of this.   This encounter was conducted via telephone.  The patient has provided two factor identification and has given verbal consent for this type of encounter and has been advised to only accept a meeting of this type in a secure network environment. The time spent during this encounter was 35 minutes including preparation, discussion, and coordination of the patient's  care. The attendants for this meeting include Bettejane Brownie  and Carmell Chiquito and Arne Bevel.  During the encounter,   Bettejane Brownie was located remotely at home.  Abdulazeez Mcphearson was located remotely at a rental unit in [place]  with his wife Srihith Nix.    Shelvia Dick, [facility]   **Disclaimer: This note was dictated with voice recognition software. Similar  sounding words can inadvertently be transcribed and this note may contain transcription errors which may not have been corrected upon publication of note.**

## 2023-05-11 NOTE — Telephone Encounter (Signed)
 Patient returning call. States he is no longer coughing and all is fine from his call with Dr Washington Hacker. However, he is now requesting results of CT scan from 2/3. I explained that results are released to [facility], and our office has yet to review them. Please advise on results. No longer an urgent message.

## 2023-05-11 NOTE — Telephone Encounter (Signed)
 Lm for patient.

## 2023-05-11 NOTE — Progress Notes (Signed)
 Telephone nursing appointment for review of most recent CT-Chest. I verified patient's identity x2 and began nursing interview.   Patient reports rhinorrhea w/ light yellow mucus but is doing well otherwise. Patient denies any other related issues at this time.   Meaningful use complete.   Patient aware of their 2pm-05/11/23 telephone appointment w/ Allana Ishikawa PA-C. I left my extension [phone] in case patient needs anything. Patient verbalized understanding. This concludes the nursing interview.   Patient contact [phone]     Avery Bodo, LPN

## 2023-05-11 NOTE — Telephone Encounter (Signed)
 CT was not ordered by our office.   Spoke to patient. He stated that ordering provider reviewed results with him, however he would like Dr. Waylan Haggard to review the results as well.

## 2023-05-14 ENCOUNTER — Telehealth (HOSPITAL_BASED_OUTPATIENT_CLINIC_OR_DEPARTMENT_OTHER): Payer: Self-pay

## 2023-05-19 NOTE — Telephone Encounter (Signed)
Called and discussed with patient.  Reviewed CT scan which shows evolving changes of postradiation fibrosis.  Nothing further needed.

## 2023-07-16 ENCOUNTER — Other Ambulatory Visit: Payer: Self-pay | Admitting: *Deleted

## 2023-07-16 DIAGNOSIS — Z9889 Other specified postprocedural states: Secondary | ICD-10-CM

## 2023-07-16 DIAGNOSIS — Z8679 Personal history of other diseases of the circulatory system: Secondary | ICD-10-CM

## 2023-07-27 NOTE — Progress Notes (Deleted)
 Office Note   Requesting Provider:  Suan Elm, MD  HPI: Peter Greene is a 83 y.o. ([birth year]) male presenting in follow-up s/p left CCA endarterectomy for >95% stenosis from prior radiation 01/2022.  Welch and his wife moved to [place]  from [place]  to be closer to their children.  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  Vascular surgery history includes EVAR in 2017 for AAA. 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  On exam today, Peter Greene denied back pain, abdominal pain, chest pain.  He denies claudication, ischemic rest pain, tissue loss.  Overall he feels to be doing well -denies recent symptoms of TIA, stroke, amaurosis.  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   Pt currently taking ASA 81 mg, aotrvastatin 40 mg, and eliquis  5 mg BID. 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   Hyperlipidemia    atorvastatin    Hypothyroidism    Interstitial lung disease (HCC)    Laryngeal cancer (HCC) 2004   Neuromuscular disorder (HCC)    bilateral feet neuropathy   Pneumonia 07/2021   x 2   Primary cancer of bone marrow (HCC)    Skin cancer    Head   Thyroid disease    Thyroid nodule 2024   Right lower lobe    Procedure Laterality Date   ARTERIAL- CAROTID ANGIOGRAPHY Left 01/25/2018  Procedure: ARTERIAL- CAROTID ANGIOGRAPHY; Surgeon: Monda Angry, MD; Location: [facility];   ENDOSTENT, ABDOMINAL AORTIC ANEURYSM (EVAR) N/A 03/04/2016  Procedure: Endostent, Abdominal  Aortic Aneurysm (Evar); Surgeon: Monda Angry, MD; Location: [facility] HEART OR; Service: Vascular; Laterality: N/A;  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   Social History   Socioeconomic History   Marital status: Married    Spouse name: Devra Fontana   Number of children: 1   Years of education: Not on file   Highest education level: Not on file  Occupational History   Not on file  Tobacco Use   Smoking status: Former    Current packs/day: 0.00    Types: Cigarettes    Quit date: 1984    Years since quitting: 41.3    Passive exposure: Never   Smokeless tobacco: Never  Vaping Use   Vaping status: Never Used  Substance and Sexual Activity   Alcohol  use: Yes    Alcohol /week: 2.0 standard drinks of alcohol     Types: 2 Standard drinks or equivalent per week   Drug use: Never   Sexual activity: Not Currently  Other Topics Concern   Not on file  Social History Narrative   Not on file   Social Drivers of Health   Financial Resource Strain: Not on file  Food Insecurity: No Food Insecurity (05/11/2023)   Hunger Vital Sign    Worried About Running Out of Food in the Last Year: Never true    Ran Out of Food in the Last Year: Never true  Transportation Needs: No Transportation Needs (05/11/2023)   PRAPARE -  Administrator, Civil Service (Medical): No    Lack of Transportation (Non-Medical): No  Physical Activity: Not on file  Stress: Not on file  Social Connections: Not on file  Intimate Partner Violence: Not At Risk (05/11/2023)   Humiliation, Afraid, Rape, and Kick questionnaire    Fear of Current or Ex-Partner: No    Emotionally Abused: No    Physically Abused: No    Sexually Abused: No  No family history on file.  Current Outpatient Medications  Medication Sig Dispense Refill   acetaminophen  (TYLENOL ) 500 MG tablet Take 500 mg by mouth every 6 (six) hours as needed for moderate pain.     aspirin  EC 81 MG tablet Take 81 mg by mouth in the  morning. Swallow whole.     atorvastatin  (LIPITOR) 40 MG tablet Take 40 mg by mouth in the morning.     ELIQUIS  5 MG TABS tablet Take 5 mg by mouth 2 (two) times daily.     ipratropium (ATROVENT) 0.03 % nasal spray Place 2 sprays into both nostrils daily.     Krill Oil 350 MG CAPS Take 350 mg by mouth every evening.     levothyroxine  (SYNTHROID ) 25 MCG tablet Take 25 mcg by mouth every evening.     Melatonin 10 MG TABS Take by mouth daily as needed (sleep).     metoprolol  tartrate (LOPRESSOR ) 25 MG tablet Take 25 mg by mouth 2 (two) times daily.     Multiple Vitamin (MULTIVITAMIN WITH MINERALS) TABS tablet Take 1 tablet by mouth in the morning. Centrum     pantoprazole  (PROTONIX ) 20 MG tablet Take 20 mg by mouth every evening.     tamsulosin  (FLOMAX ) 0.4 MG CAPS capsule Take 0.4 mg by mouth in the morning.     valACYclovir  (VALTREX ) 500 MG tablet Take 500 mg by mouth in the morning.     No current facility-administered medications for this visit.    No Known Allergies   REVIEW OF SYSTEMS:  [X]  denotes positive finding, [ ]  denotes negative finding Cardiac  Comments:  Chest pain or chest pressure:    Shortness of breath upon exertion:    Short of breath when lying flat:    Irregular heart rhythm:        Vascular    Pain in calf, thigh, or hip brought on by ambulation:    Pain in feet at night that wakes you up from your sleep:     Blood clot in your veins:    Leg swelling:         Pulmonary    Oxygen at home:    Productive cough:     Wheezing:         Neurologic    Sudden weakness in arms or legs:     Sudden numbness in arms or legs:     Sudden onset of difficulty speaking or slurred speech:    Temporary loss of vision in one eye:     Problems with dizziness:         Gastrointestinal    Blood in stool:     Vomited blood:         Genitourinary    Burning when urinating:     Blood in urine:        Psychiatric    Major depression:         Hematologic    Bleeding  problems:    Problems with blood clotting too easily:  Skin    Rashes or ulcers:        Constitutional    Fever or chills:      PHYSICAL EXAMINATION:  There were no vitals filed for this visit.   General:  WDWN in NAD; vital signs documented above Gait: Not observed HENT: WNL, normocephalic Pulmonary: normal non-labored breathing , without wheezing Cardiac: regular HR Abdomen: soft, NT, no masses Skin: without rashes Vascular Exam/Pulses:  Right Left  Radial 2+ (normal) 2+ (normal)  Ulnar 2+ (normal) 2+ (normal)  Femoral    Popliteal    DP 2+ (normal) 2+ (normal)  PT     Extremities: without ischemic changes, without Gangrene , without cellulitis; without open wounds;  Musculoskeletal: no muscle wasting or atrophy  Neurologic: A&O X 3;  No focal weakness or paresthesias are detected Psychiatric:  The pt has Normal affect.   Non-Invasive Vascular Imaging:   Summary:  Right Carotid: Velocities in the right ICA are consistent with a 1-39%  stenosis.   Left Carotid: Velocities in the left ICA are consistent with a 1-39%  stenosis.               ASSESSMENT/PLAN: Corian Hensel is a 83 y.o. 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    Teshawn continues to do well.  He continues to take aspirin , Eliquis , statin No new TIA, stroke, amaurosis.  Continues to travel and work part-time.  Plan to see [place] in 1 years time with repeat duplex of his carotid arteries bilaterally as well as repeat ultrasound insonation of his previous EVAR to ensure no endoleak or growth.    Kayla Part, MD [facility] [phone]

## 2023-07-29 ENCOUNTER — Telehealth: Payer: Self-pay | Admitting: *Deleted

## 2023-07-29 NOTE — Telephone Encounter (Signed)
 Called patient to inform of Ct for 11-06-23- arrival time- 11:45 am @ [facility] no restrictions to scan, patient to receive Ct results from Allana Ishikawa on 11-16-23 @ 1 pm via telephone, lvm for a return call

## 2023-07-30 ENCOUNTER — Other Ambulatory Visit (HOSPITAL_COMMUNITY): Payer: Medicare Other

## 2023-07-30 ENCOUNTER — Ambulatory Visit (HOSPITAL_COMMUNITY): Payer: Medicare Other

## 2023-07-30 ENCOUNTER — Ambulatory Visit: Payer: Medicare Other | Admitting: Vascular Surgery

## 2023-09-28 IMAGING — CT CT CHEST W/O CM
2 of 3 series · 15 of 36 positions shown, 18 images · non-contrast
Comparison: Current chest radiograph.

CLINICAL DATA: Short of breath since last night. Opacity at the
right lung base on the current chest radiograph.



[Series 2: thorax · axial · 0.78mm/px · z∈[-218,+36]mm · 12 of 151 slices shown, 15 images]
[im 12/151  mediastinal]
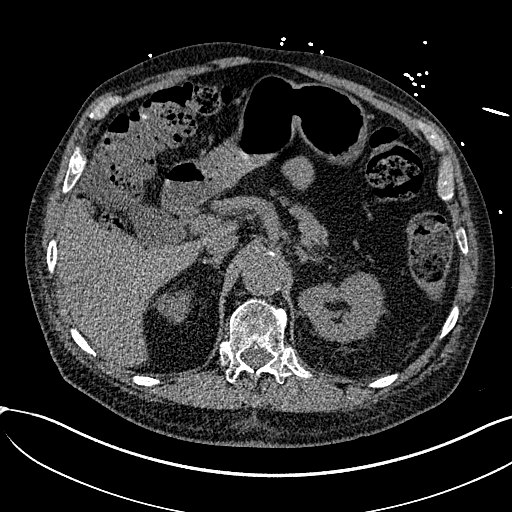
[im 12/151  lung]
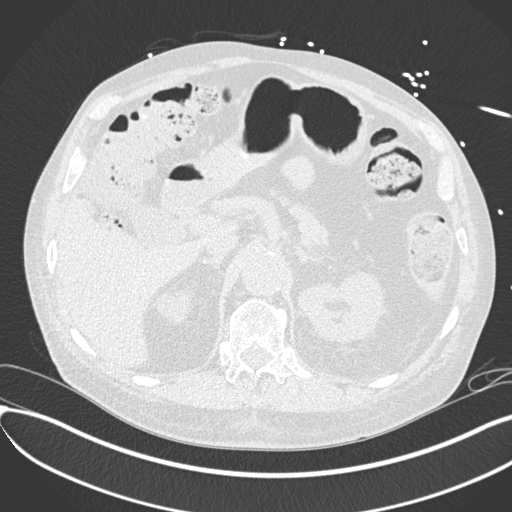
[im 23/151  lung]
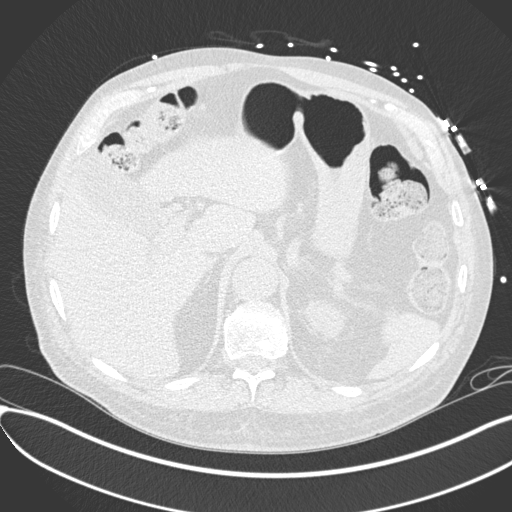
[im 34/151  lung]
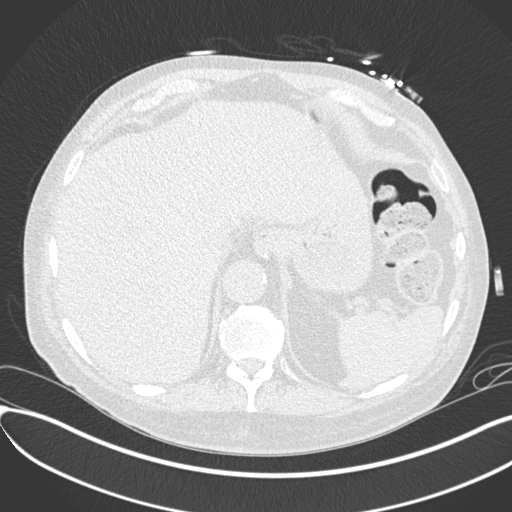
[im 45/151  lung]
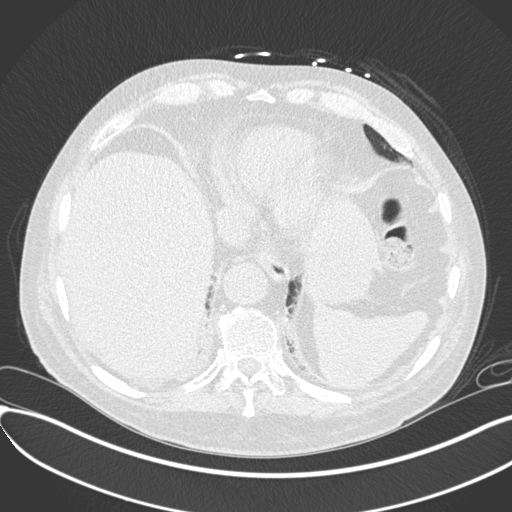
[im 56/151  mediastinal]
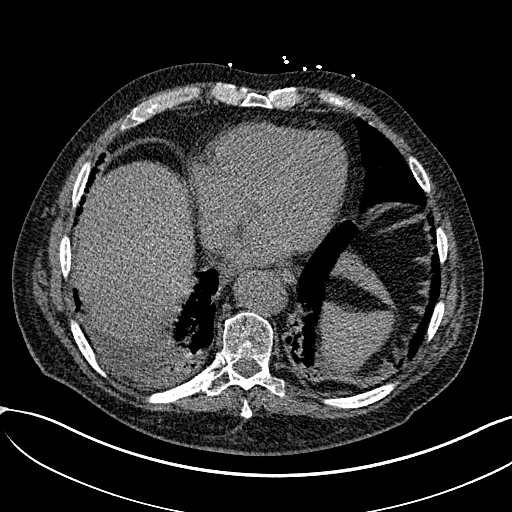
[im 56/151  lung]
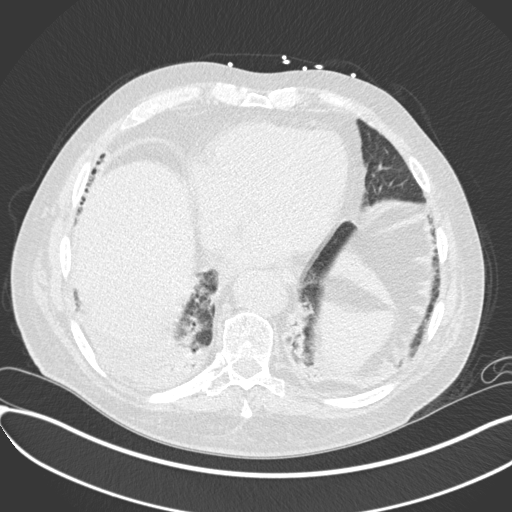
[im 67/151  lung]
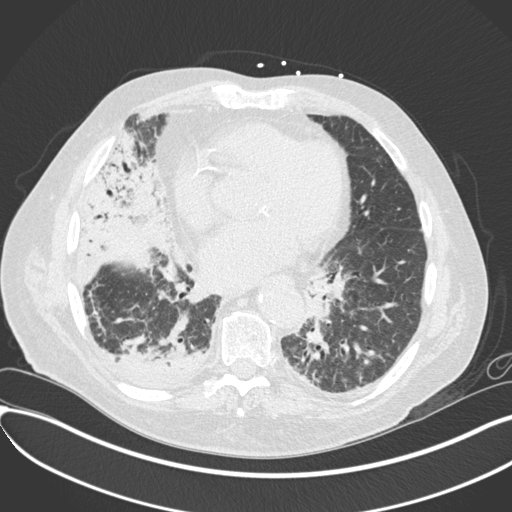
[im 84/151  lung]
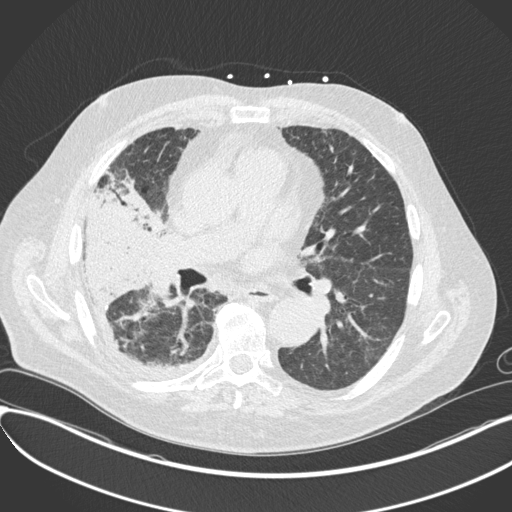
[im 95/151  lung]
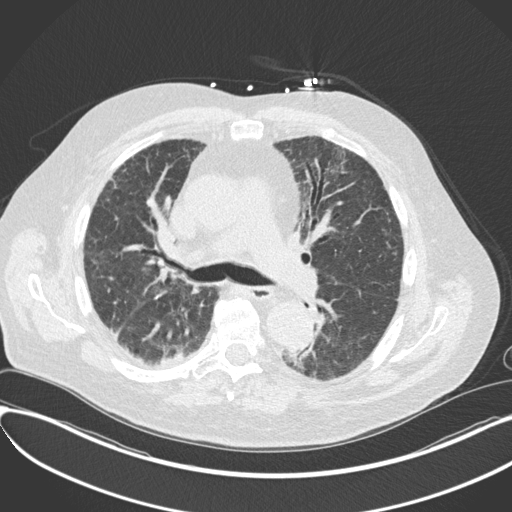
[im 106/151  mediastinal]
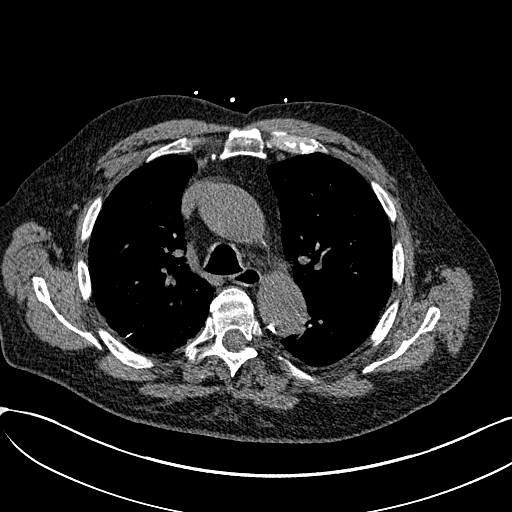
[im 106/151  lung]
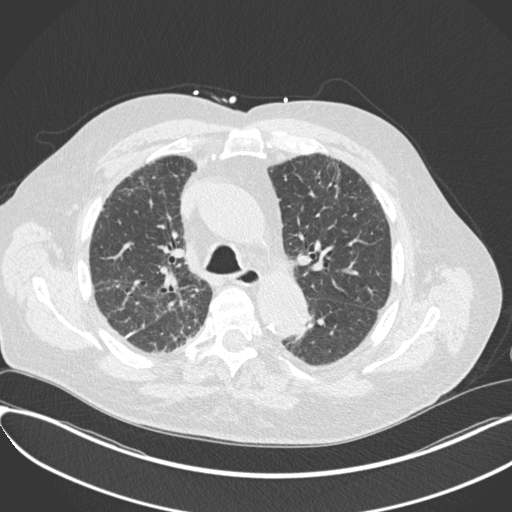
[im 117/151  lung]
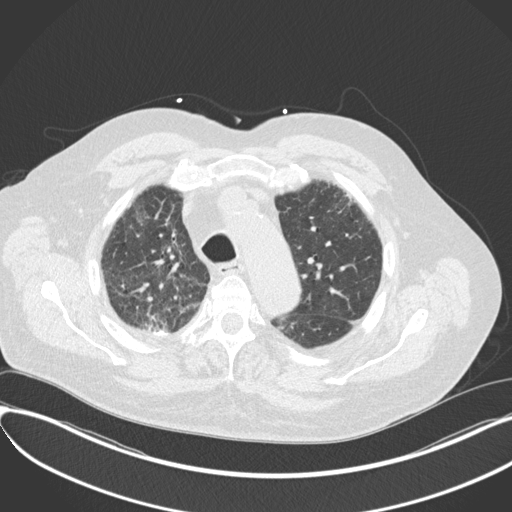
[im 128/151  lung]
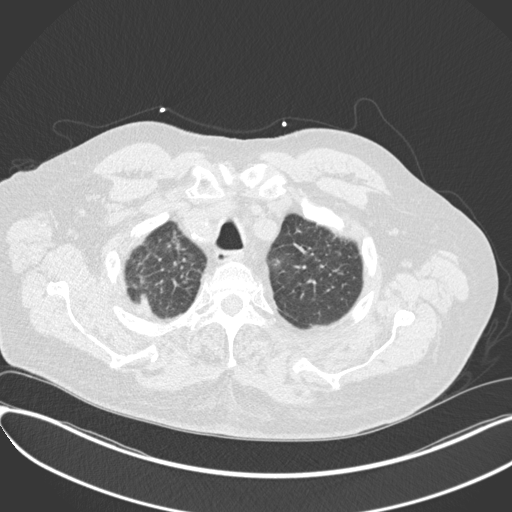
[im 139/151  lung]
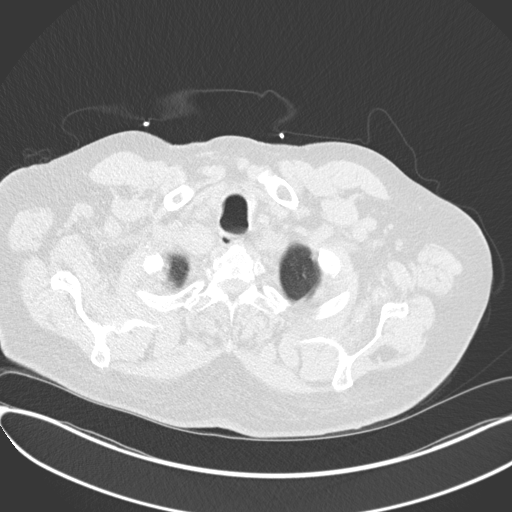

[Series 4: coronal · coronal · 0.62mm/px · 3 of 161 slices shown]
[im 33/161  lung]
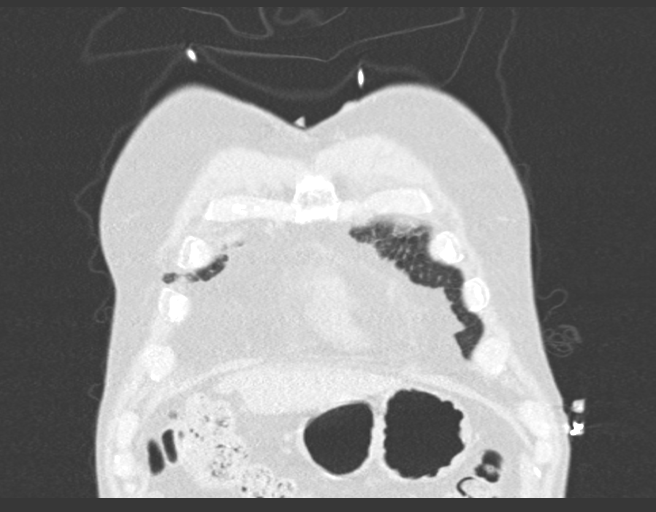
[im 65/161  lung]
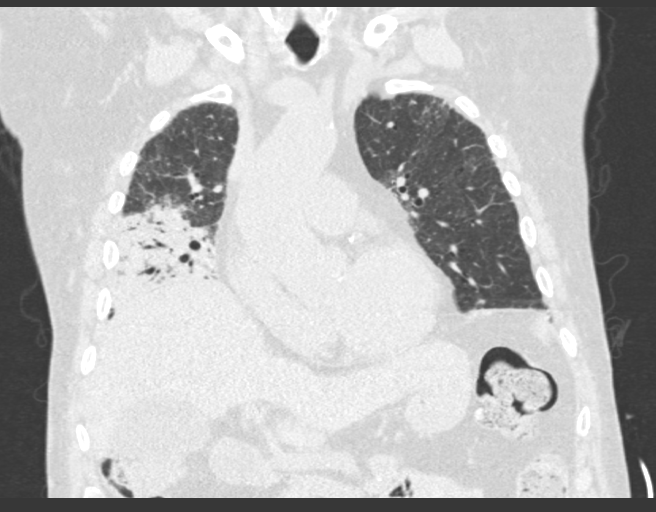
[im 97/161  lung]
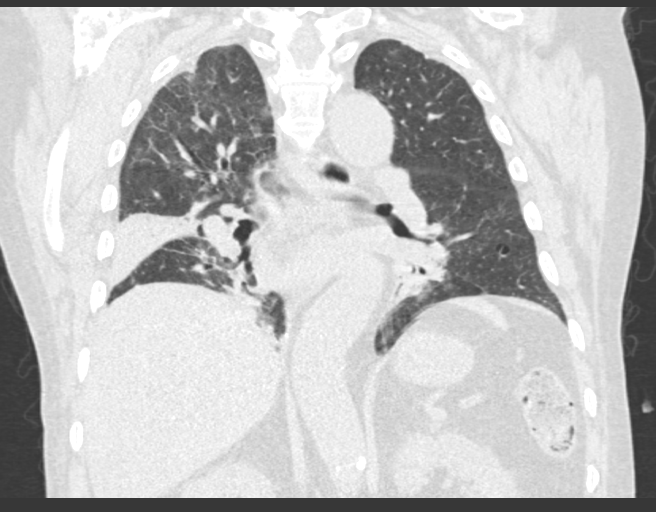

[15 of 36 positions shown; findings below may reference images not displayed]

FINDINGS: Cardiovascular: Heart normal in size. No pericardial effusion. Mild
three-vessel coronary artery calcifications. Great vessels are
normal in caliber. Mild aortic atherosclerosis.

Mediastinum/Nodes: No neck base, mediastinal or hilar masses or
enlarged lymph nodes. Trachea and esophagus are unremarkable.

Lungs/Pleura: Consolidation throughout most of the right middle
lobe. Similar left 6 dense of consolidation noted dependent lower
lobes, right greater than left. Lungs show heterogeneous areas
peripheral reticulation consistent with fibrosis. Trace right
pleural effusion. No lung mass or suspicious nodule. No
pneumothorax.

Upper Abdomen: No acute abnormality.

Musculoskeletal: No fracture or acute finding.  No bone lesion.
IMPRESSION: 1. Multifocal pneumonia. The primary area of consolidation extends
throughout most of the right middle lobe. Milder consolidation noted
in the lower lobes.
2. Heterogeneous areas peripheral reticulation consistent with
interstitial fibrosis.
3. Mild coronary artery calcifications and aortic atherosclerosis.

Aortic Atherosclerosis ([id number]).

## 2023-09-30 IMAGING — DX DG CHEST 1V PORT
1 series · 1 of 1 positions shown · non-contrast
Comparison: Chest CT and portable chest both 08/11/2021.

CLINICAL DATA: Pneumonia follow-up.

EXAM:
PORTABLE CHEST 1 VIEW

[chest ap]
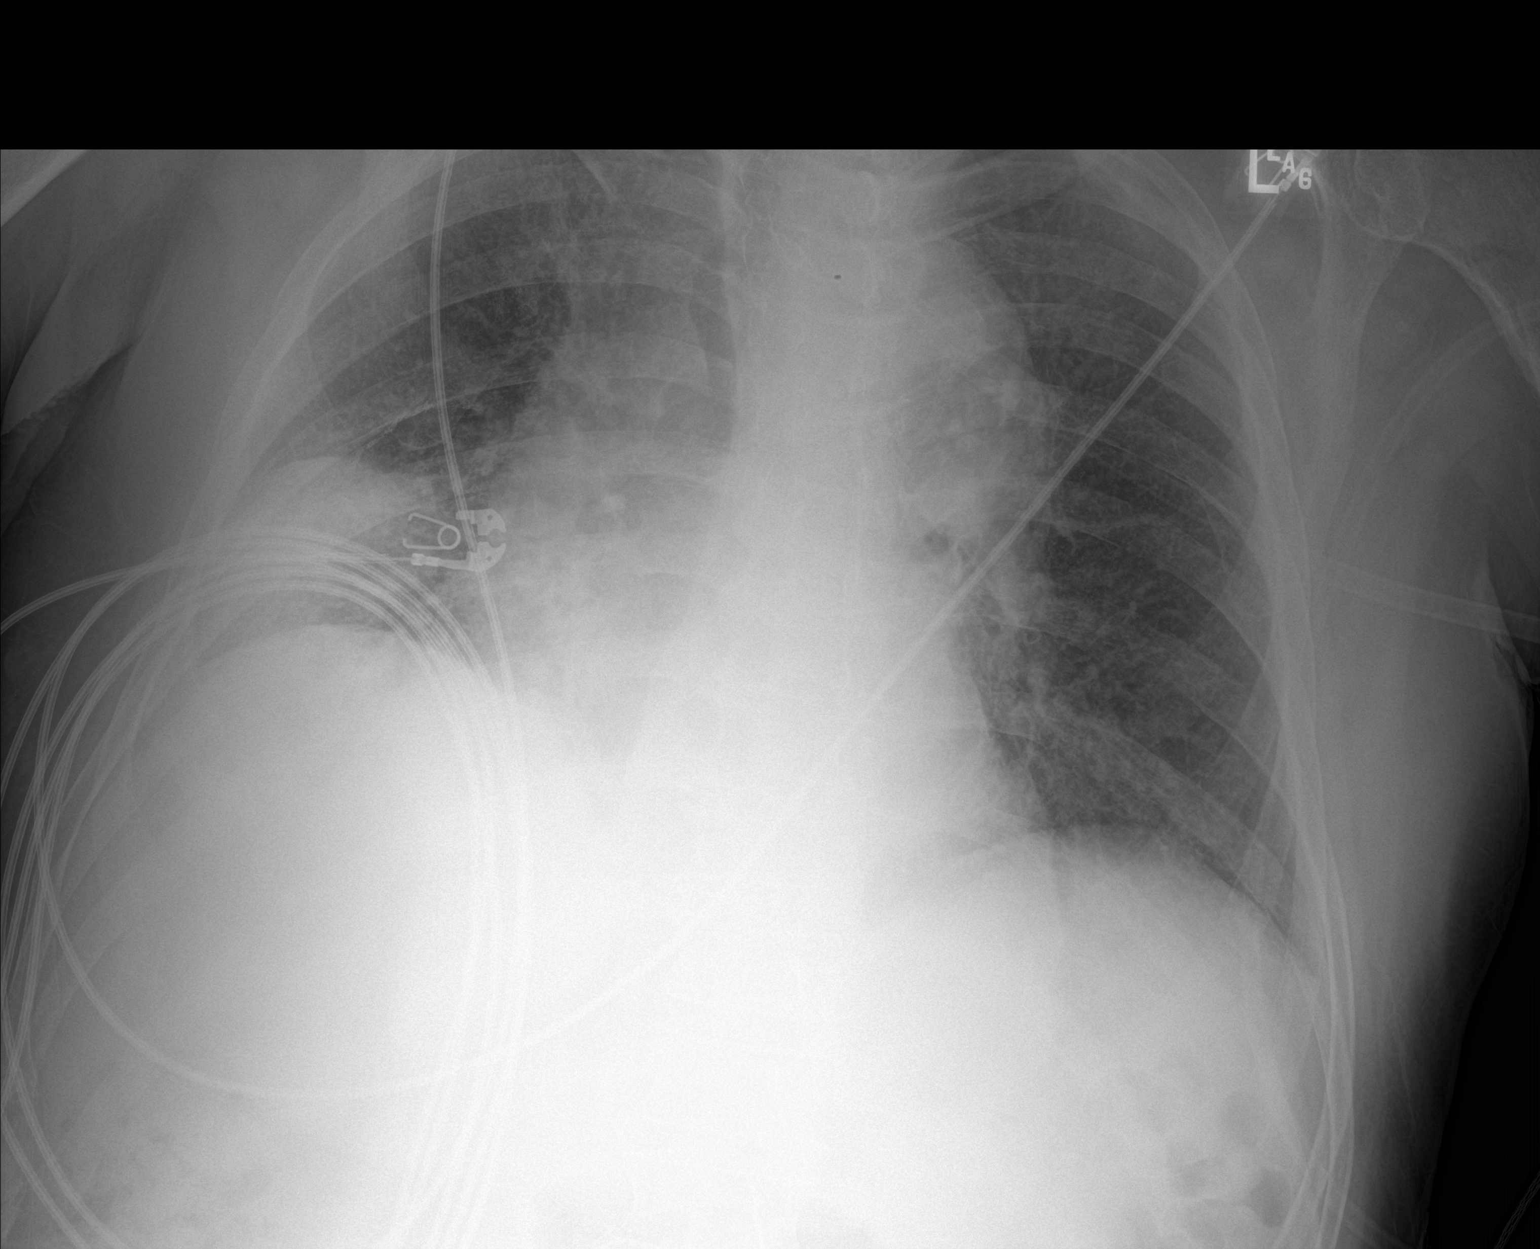

[1 of 1 positions shown; findings below may reference images not displayed]

FINDINGS: [DATE] a.m., 08/13/2021. There is persisting dense right middle lobe
consolidation, the prior CT also showing consolidation in the
posterior basal right lower lobe which is not well evaluated on this
portable chest of low inspiration.

The remaining aerated lungs are generally clear. The heart mildly
enlarged and there is aortic atherosclerosis, ectasia and tortuosity
with stable mediastinum.

No vascular congestion is seen. There is no appreciable pleural
effusion. There is thoracic spondylosis.
IMPRESSION: Continued dense right middle lobe consolidation and probably no
interval change in posterior basal right lower lobe consolidation
better visualized on CT. No new abnormality.

## 2023-10-01 ENCOUNTER — Other Ambulatory Visit: Payer: Self-pay | Admitting: Vascular Surgery

## 2023-10-01 DIAGNOSIS — Z9889 Other specified postprocedural states: Secondary | ICD-10-CM

## 2023-10-01 DIAGNOSIS — Z8679 Personal history of other diseases of the circulatory system: Secondary | ICD-10-CM

## 2023-10-01 DIAGNOSIS — I6522 Occlusion and stenosis of left carotid artery: Secondary | ICD-10-CM

## 2023-10-13 NOTE — Progress Notes (Unsigned)
 Office Note   Requesting Provider:  [name], MD  HPI: Peter Greene is a 83 y.o. (12-Apr-1940) male presenting in follow-up s/p left CCA endarterectomy for >95% stenosis from prior radiation 01/2022.  Alisha and his wife moved to [place]  from [place]  to be closer to their children.  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  Vascular surgery history includes EVAR in 2017 for AAA. 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  On exam today, Peter Greene denied back pain, abdominal pain, chest pain.  He denies claudication, ischemic rest pain, tissue loss.  Overall he feels to be doing well -denies recent symptoms of TIA, stroke, amaurosis.  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   Pt currently taking ASA 81 mg, aotrvastatin 40 mg, and eliquis  5 mg BID. 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   Hyperlipidemia    atorvastatin    Hypothyroidism    Interstitial lung disease (HCC)    Laryngeal cancer (HCC) 2004   Neuromuscular disorder (HCC)    bilateral feet neuropathy   Pneumonia 07/2021   x 2   Primary cancer of bone marrow (HCC)    Skin cancer    Head   Thyroid disease    Thyroid nodule 2024   Right lower lobe    Procedure Laterality Date   ARTERIAL- CAROTID ANGIOGRAPHY Left 01/25/2018  Procedure: ARTERIAL- CAROTID ANGIOGRAPHY; Surgeon: Dyane Lennette LABOR, MD; Location: [facility];   ENDOSTENT, ABDOMINAL AORTIC ANEURYSM (EVAR) N/A 03/04/2016  Procedure: Endostent, Abdominal  Aortic Aneurysm (Evar); Surgeon: Dyane Lennette LABOR, MD; Location: [facility] HEART OR; Service: Vascular; Laterality: N/A;  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   Social History   Socioeconomic History   Marital status: Married    Spouse name: [name]   Number of children: 1   Years of education: Not on file   Highest education level: Not on file  Occupational History   Not on file  Tobacco Use   Smoking status: Former    Current packs/day: 0.00    Types: Cigarettes    Quit date: 1984    Years since quitting: 41.5    Passive exposure: Never   Smokeless tobacco: Never  Vaping Use   Vaping status: Never Used  Substance and Sexual Activity   Alcohol  use: Yes    Alcohol /week: 2.0 standard drinks of alcohol     Types: 2 Standard drinks or equivalent per week   Drug use: Never   Sexual activity: Not Currently  Other Topics Concern   Not on file  Social History Narrative   Not on file   Social Drivers of Health   Financial Resource Strain: Not on file  Food Insecurity: No Food Insecurity (05/11/2023)   Hunger Vital Sign    Worried About Running Out of Food in the Last Year: Never true    Ran Out of Food in the Last Year: Never true  Transportation Needs: No Transportation Needs (05/11/2023)   PRAPARE -  Administrator, Civil Service (Medical): No    Lack of Transportation (Non-Medical): No  Physical Activity: Not on file  Stress: Not on file  Social Connections: Not on file  Intimate Partner Violence: Not At Risk (05/11/2023)   Humiliation, Afraid, Rape, and Kick questionnaire    Fear of Current or Ex-Partner: No    Emotionally Abused: No    Physically Abused: No    Sexually Abused: No  No family history on file.  Current Outpatient Medications  Medication Sig Dispense Refill   acetaminophen  (TYLENOL ) 500 MG tablet Take 500 mg by mouth every 6 (six) hours as needed for moderate pain.     aspirin  EC 81 MG tablet Take 81 mg by mouth in the  morning. Swallow whole.     atorvastatin  (LIPITOR) 40 MG tablet Take 40 mg by mouth in the morning.     ELIQUIS  5 MG TABS tablet Take 5 mg by mouth 2 (two) times daily.     ipratropium (ATROVENT) 0.03 % nasal spray Place 2 sprays into both nostrils daily.     Krill Oil 350 MG CAPS Take 350 mg by mouth every evening.     levothyroxine  (SYNTHROID ) 25 MCG tablet Take 25 mcg by mouth every evening.     Melatonin 10 MG TABS Take by mouth daily as needed (sleep).     metoprolol  tartrate (LOPRESSOR ) 25 MG tablet Take 25 mg by mouth 2 (two) times daily.     Multiple Vitamin (MULTIVITAMIN WITH MINERALS) TABS tablet Take 1 tablet by mouth in the morning. Centrum     pantoprazole  (PROTONIX ) 20 MG tablet Take 20 mg by mouth every evening.     tamsulosin  (FLOMAX ) 0.4 MG CAPS capsule Take 0.4 mg by mouth in the morning.     valACYclovir  (VALTREX ) 500 MG tablet Take 500 mg by mouth in the morning.     No current facility-administered medications for this visit.    No Known Allergies   REVIEW OF SYSTEMS:  [X]  denotes positive finding, [ ]  denotes negative finding Cardiac  Comments:  Chest pain or chest pressure:    Shortness of breath upon exertion:    Short of breath when lying flat:    Irregular heart rhythm:        Vascular    Pain in calf, thigh, or hip brought on by ambulation:    Pain in feet at night that wakes you up from your sleep:     Blood clot in your veins:    Leg swelling:         Pulmonary    Oxygen at home:    Productive cough:     Wheezing:         Neurologic    Sudden weakness in arms or legs:     Sudden numbness in arms or legs:     Sudden onset of difficulty speaking or slurred speech:    Temporary loss of vision in one eye:     Problems with dizziness:         Gastrointestinal    Blood in stool:     Vomited blood:         Genitourinary    Burning when urinating:     Blood in urine:        Psychiatric    Major depression:         Hematologic    Bleeding  problems:    Problems with blood clotting too easily:  Skin    Rashes or ulcers:        Constitutional    Fever or chills:      PHYSICAL EXAMINATION:  There were no vitals filed for this visit.   General:  WDWN in NAD; vital signs documented above Gait: Not observed HENT: WNL, normocephalic Pulmonary: normal non-labored breathing , without wheezing Cardiac: regular HR Abdomen: soft, NT, no masses Skin: without rashes Vascular Exam/Pulses:  Right Left  Radial 2+ (normal) 2+ (normal)  Ulnar 2+ (normal) 2+ (normal)  Femoral    Popliteal    DP 2+ (normal) 2+ (normal)  PT     Extremities: without ischemic changes, without Gangrene , without cellulitis; without open wounds;  Musculoskeletal: no muscle wasting or atrophy  Neurologic: A&O X 3;  No focal weakness or paresthesias are detected Psychiatric:  The pt has Normal affect.   Non-Invasive Vascular Imaging:   Summary:  Right Carotid: Velocities in the right ICA are consistent with a 1-39%  stenosis.   Left Carotid: Velocities in the left ICA are consistent with a 1-39%  stenosis.               ASSESSMENT/PLAN: Peter Greene is a 83 y.o. 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    Kysean continues to do well.  He continues to take aspirin , Eliquis , statin No new TIA, stroke, amaurosis.  Continues to travel and work part-time.  Plan to see Peter Greene in 1 years time with repeat duplex of his carotid arteries bilaterally as well as repeat ultrasound insonation of his previous EVAR to ensure no endoleak or growth.    Fonda FORBES Rim, MD [facility] [phone]

## 2023-10-15 ENCOUNTER — Ambulatory Visit: Admitting: Vascular Surgery

## 2023-10-15 ENCOUNTER — Ambulatory Visit (HOSPITAL_COMMUNITY)
Admission: RE | Admit: 2023-10-15 | Discharge: 2023-10-15 | Disposition: A | Discharge status: Home / Self Care | Source: Ambulatory Visit | Attending: Vascular Surgery | Admitting: Vascular Surgery

## 2023-10-15 ENCOUNTER — Ambulatory Visit (HOSPITAL_BASED_OUTPATIENT_CLINIC_OR_DEPARTMENT_OTHER)
Admission: RE | Admit: 2023-10-15 | Discharge: 2023-10-15 | Discharge status: Home / Self Care | Source: Ambulatory Visit | Attending: Vascular Surgery

## 2023-10-15 ENCOUNTER — Encounter: Payer: Self-pay | Admitting: Vascular Surgery

## 2023-10-15 VITALS — BP 85/56 | HR 56 | Temp 97.9°F | Resp 18 | Ht 68.0 in | Wt 200.0 lb

## 2023-10-15 DIAGNOSIS — I6522 Occlusion and stenosis of left carotid artery: Secondary | ICD-10-CM | POA: Insufficient documentation

## 2023-10-15 DIAGNOSIS — Z8679 Personal history of other diseases of the circulatory system: Secondary | ICD-10-CM

## 2023-10-15 DIAGNOSIS — Z9889 Other specified postprocedural states: Secondary | ICD-10-CM

## 2023-11-06 ENCOUNTER — Ambulatory Visit (HOSPITAL_BASED_OUTPATIENT_CLINIC_OR_DEPARTMENT_OTHER)
Admission: RE | Admit: 2023-11-06 | Discharge: 2023-11-06 | Disposition: A | Discharge status: Home / Self Care | Source: Ambulatory Visit | Attending: Radiation Oncology | Admitting: Radiation Oncology

## 2023-11-06 DIAGNOSIS — C3431 Malignant neoplasm of lower lobe, right bronchus or lung: Secondary | ICD-10-CM | POA: Diagnosis present

## 2023-11-06 LAB — POCT I-STAT CREATININE: Creatinine, Ser: 1.3 mg/dL — ABNORMAL HIGH (ref 0.61–1.24)

## 2023-11-06 MED ORDER — IOHEXOL 300 MG/ML  SOLN
75.0000 mL | Freq: Once | INTRAMUSCULAR | Status: AC | PRN
  Administered 2023-11-06: 75 mL via INTRAVENOUS

## 2023-11-16 ENCOUNTER — Ambulatory Visit
Admission: RE | Admit: 2023-11-16 | Discharge: 2023-11-16 | Disposition: A | Discharge status: Home / Self Care | Payer: Medicare Other | Source: Ambulatory Visit | Attending: Radiation Oncology | Admitting: Radiation Oncology

## 2023-11-16 DIAGNOSIS — C3431 Malignant neoplasm of lower lobe, right bronchus or lung: Secondary | ICD-10-CM

## 2023-11-16 NOTE — Progress Notes (Signed)
 Radiation Oncology         [phone]) [phone] ________________________________  Name: Peter Greene        MRN: [id number]  Date of Service: 11/16/2023 DOB: 1941/02/12  RR:Jmnwdnw, Charlie, MD  Gladis Leonor HERO, MD     REFERRING PHYSICIAN: Gladis Leonor HERO, MD   DIAGNOSIS: The encounter diagnosis was Malignant neoplasm of lower lobe of right lung [facility]).   HISTORY OF PRESENT ILLNESS: Sukhraj Esquivias is a 83 y.o. male with a diagnosis of putative stage I lung cnacer.  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  He was diagnosed in May 2023 with a community-acquired pneumonia that led to sepsis and was treated with antibiotics and steroids.  He reports a diagnosis as well involving idiopathic pulmonary pneumonitis/fibrosis, he relocated to [place]  and has been in the state for about a year.  He established with pulmonary medicine as a result of his hospitalization in 2023, and aCT of the chest with high-resolution in November 2023 given his history of interstitial lung disease was performed. This showed a nodule in the RLL. He also had additional scattered small pulmonary nodules that were considered unchanged in comparison to prior CT including a left upper lobe nodule measuring 5 mm.  There was a lesion in the right hepatic lobe measuring 2.4 cm.  No evidence of adenopathy was appreciated.  He was counseled on a PET scan to further investigate the lung and liver findings, and this was performed on 03/19/2022.  Hypermetabolic activity was seen in the right lower lobe nodule measuring 10 mm in greatest dimension with an SUV of 4.1.  No other hypermetabolic changes in the lungs or in the region of the lymph nodes were appreciated.  The  lesion in the liver showed low-attenuation measuring 2.7 cm with no metabolic activity favored to be benign.  He has a large fat filled right inguinal hernia on the scan as well.  No other concerns for malignancy were appreciated.  Bronchoscopy  on 04/17/2022 yielded scant cellularity but benign bronchial cells and no malignancy identified.  He was counseled on repeat checking and a CT super D scan prior to his second bronchoscopy on 06/04/2022 showed an increase in the nodule measuring 1.3 cm, and stability in a left upper lobe nodule measuring 6 mm and what appears to be benign findings in the liver and spleen.  He underwent a second bronchoscopy on 06/19/2022, cytology again showed benign bronchial mucosa with chronic inflammation in the aspirate and in the brushings nondiagnostic material no malignancy could be identified.    His case was discussed in thoracic oncology conference, and radiographically remained concerning for early stage I lung cancer in the right lower lobe. He did meet as well with Dr. Shyrl and it was felt that his pulmonary function would potentially suffer further, so he elected to completed stereotactic body radiotherapy (SBRT). He completed this on 08/28/22. His post treatment CT on 09/09/22 showed interval decrease in the RLL nodule, and a stable 5 mm LUL nodule remained. He has had additional scans showing post treatment change in the lung, and again on 02/02/23, confirming post treatment changes in the RLL, and evidence of interstitial lung disease, aortic aneurysm measuring 4.1 cm, pancreatic duct dilitation,  athersclerosis, and enlarged pulmonary trunk. He also had an MRCP on 04/23/23 showing hemangiomatous changes in the liver, and dilation at the main pancreatic duct at the level of the neck, and a cystic change measuring 4 mm. No mass lesion was noted.   He has continued in surveillance with CT imaging of the chest, and his most recent was on 11/06/23 and showed architectural  distortion consistent with prior radiotherapy in the RLL region with change into the right hilar region. Similar architectural change in the LUL were noted. There were also multiple new ground glass changes in the RLL measuring up to 7 mm and 9 mm, and with bilateral new nodules, and of these the largest being 10 mm in the LLL. Stable small effusion was noted in the right chest, and stable hypodense splenic lesion was noted as was right upper pole renal cysts. Evidence of atherosclerosis was again noted. He's contacted by phone to review these results.    PREVIOUS RADIATION THERAPY: Yes   08/21/22-08/28/22: Plan Name: Lung_R_SBRT Site: Lung, RLL Technique: SBRT/SRT-IMRT Mode: Photon Dose Per Fraction: 18 Gy Prescribed Dose (Delivered / Prescribed): 54 Gy / 54 Gy Prescribed Fxs (Delivered / Prescribed): 3 / 3  2020 at [facility] details unknown but pt recalls full body radiotherapy in lieu of bone marrow transplant  2004 Laryngeal Cancer in [place]  details unknown   PAST MEDICAL HISTORY:  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   Hyperlipidemia    atorvastatin    Hypothyroidism    Interstitial lung disease (HCC)    Laryngeal cancer (HCC) 2004   Neuromuscular disorder (HCC)    bilateral feet neuropathy   Pneumonia 07/2021   x 2   Primary cancer of bone marrow (HCC)    Skin cancer    Head   Thyroid disease    Thyroid nodule 2024   Right lower lobe       PAST SURGICAL HISTORY: Past Surgical History:  Procedure Laterality Date   ABDOMINAL AORTIC ANEURYSM REPAIR     BONE MARROW TRANSPLANT     BRONCHIAL BIOPSY  04/17/2022   Procedure: BRONCHIAL BIOPSIES;  Surgeon: Gladis Leonor HERO, MD;  Location: [facility] ENDOSCOPY;  Service:  Pulmonary;;   BRONCHIAL BIOPSY  06/19/2022   Procedure: BRONCHIAL BIOPSIES;  Surgeon: Gladis Leonor HERO, MD;  Location: [facility] ENDOSCOPY;  Service: Pulmonary;;   BRONCHIAL BRUSHINGS  04/17/2022   Procedure: BRONCHIAL BRUSHINGS;  Surgeon: Gladis Leonor HERO, MD;  Location: [facility] ENDOSCOPY;  Service: Pulmonary;;   BRONCHIAL NEEDLE ASPIRATION BIOPSY  04/17/2022   Procedure: BRONCHIAL NEEDLE ASPIRATION BIOPSIES;  Surgeon: Gladis Leonor HERO, MD;  Location[facility] ENDOSCOPY;  Service: Pulmonary;;   BRONCHIAL NEEDLE ASPIRATION BIOPSY  06/19/2022   Procedure: BRONCHIAL NEEDLE ASPIRATION BIOPSIES;  Surgeon: Gladis Leonor HERO, MD;  Location: [facility] ENDOSCOPY;  Service: Pulmonary;;   BRONCHIAL WASHINGS  04/17/2022   Procedure: BRONCHIAL WASHINGS;  Surgeon: Gladis Leonor HERO, MD;  Location: [facility] ENDOSCOPY;  Service: Pulmonary;;   BRONCHIAL WASHINGS  06/19/2022   Procedure: BRONCHIAL WASHINGS;  Surgeon: Gladis Leonor HERO, MD;  Location: [facility] ENDOSCOPY;  Service: Pulmonary;;   CARDIAC CATHETERIZATION     ENDARTERECTOMY Left 01/30/2022   Procedure: ENDARTERECTOMY CAROTID;  Surgeon: Lanis Chew  E, MD;  Location: [facility] OR;  Service: Vascular;  Laterality: Left;   EYE SURGERY     FIDUCIAL MARKER PLACEMENT  06/19/2022   Procedure: FIDUCIAL MARKER PLACEMENT;  Surgeon: Gladis Leonor HERO, MD;  Location: [facility] ENDOSCOPY;  Service: Pulmonary;;   HERNIA REPAIR     KNEE SURGERY Bilateral    Joint replacements   MOHS SURGERY  2024   x 2 R & L   PATCH ANGIOPLASTY Left 01/30/2022   Procedure: PATCH ANGIOPLASTY WITH GEORGE BIOLOGIC PATCH [id number];  Surgeon: Lanis Fonda BRAVO, MD;  Location: [facility] OR;  Service: Vascular;  Laterality: Left;   TONSILLECTOMY  1946   VIDEO BRONCHOSCOPY WITH RADIAL ENDOBRONCHIAL ULTRASOUND  04/17/2022   Procedure: VIDEO BRONCHOSCOPY WITH RADIAL ENDOBRONCHIAL ULTRASOUND;  Surgeon: Gladis Leonor HERO, MD;  Location: [facility] ENDOSCOPY;  Service: Pulmonary;;   VIDEO BRONCHOSCOPY WITH RADIAL ENDOBRONCHIAL ULTRASOUND   06/19/2022   Procedure: VIDEO BRONCHOSCOPY WITH RADIAL ENDOBRONCHIAL ULTRASOUND;  Surgeon: Gladis Leonor HERO, MD;  Location[facility] ENDOSCOPY;  Service: Pulmonary;;     FAMILY HISTORY: No family history on file.   SOCIAL HISTORY:  reports that he quit smoking about 41 years ago. His smoking use included cigarettes. He has never been exposed to tobacco smoke. He has never used smokeless tobacco. He reports current alcohol  use of about 2.0 standard drinks of alcohol  per week. He reports that he does not use drugs. The patient is married and lives in [place]. He exercises 4 days a week. He and his wife enjoy going to their condo in [place] in the winters.   ALLERGIES: Patient has no known allergies.   MEDICATIONS:  Current Outpatient Medications  Medication Sig Dispense Refill   acetaminophen  (TYLENOL ) 500 MG tablet Take 500 mg by mouth every 6 (six) hours as needed for moderate pain.     aspirin  EC 81 MG tablet Take 81 mg by mouth in the morning. Swallow whole.     atorvastatin  (LIPITOR) 40 MG tablet Take 40 mg by mouth in the morning.     ELIQUIS  5 MG TABS tablet Take 5 mg by mouth 2 (two) times daily.     ipratropium (ATROVENT) 0.03 % nasal spray Place 2 sprays into both nostrils daily.     Krill Oil 350 MG CAPS Take 350 mg by mouth every evening.     levothyroxine  (SYNTHROID ) 25 MCG tablet Take 25 mcg by mouth every evening.     Melatonin 10 MG TABS Take by mouth daily as needed (sleep).     metoprolol  tartrate (LOPRESSOR ) 25 MG tablet Take 25 mg by mouth 2 (two) times daily.     Multiple Vitamin (MULTIVITAMIN WITH MINERALS) TABS tablet Take 1 tablet by mouth in the morning. Centrum     pantoprazole  (PROTONIX ) 20 MG tablet Take 20 mg by mouth every evening.     tamsulosin  (FLOMAX ) 0.4 MG CAPS capsule Take 0.4 mg by mouth in the morning.     valACYclovir  (VALTREX ) 500 MG tablet Take 500 mg by mouth in the morning.     No current facility-administered medications for this encounter.      REVIEW OF SYSTEMS: On review of systems, the patient reports that he is doing pretty well. He acknowledges that he was seen about 3 weeks ago for productive purulent cough and was diagnosed by a CXR with pneumonia and completed a course of Levaquin. He has been feeling much better and having more clear mucous and post nasal drip and no fevers. He denies any  progressive shortness of breath or concerns with chest pain. No other complaints are verbalized.     PHYSICAL EXAM:  Unable to assess due to encounter type.    ECOG = 1  0 - Asymptomatic (Fully active, able to carry on all predisease activities without restriction)  1 - Symptomatic but completely ambulatory (Restricted in physically strenuous activity but ambulatory and able to carry out work of a light or sedentary nature. For example, light housework, office work)  2 - Symptomatic, <50% in bed during the day (Ambulatory and capable of all self care but unable to carry out any work activities. Up and about more than 50% of waking hours)  3 - Symptomatic, >50% in bed, but not bedbound (Capable of only limited self-care, confined to bed or chair 50% or more of waking hours)  4 - Bedbound (Completely disabled. Cannot carry on any self-care. Totally confined to bed or chair)  5 - Death   Raylene MM, Creech RH, Tormey DC, et al. [phone]). Toxicity and response criteria of the [facility]. Am. DOROTHA Bridges. Oncol. 5 (6): 649-55    LABORATORY DATA:  Lab Results  Component Value Date   WBC 6.9 06/19/2022   HGB 11.8 (L) 06/19/2022   HCT 37.3 (L) 06/19/2022   MCV 100.0 06/19/2022   PLT 207 06/19/2022   Lab Results  Component Value Date   NA 139 06/19/2022   K 4.0 06/19/2022   CL 103 06/19/2022   CO2 25 06/19/2022   Lab Results  Component Value Date   ALT 19 01/23/2022   AST 22 01/23/2022   ALKPHOS 85 01/23/2022   BILITOT 0.6 01/23/2022      RADIOGRAPHY: CT CHEST W CONTRAST Result Date:  11/12/2023 CLINICAL DATA:  Non-small cell lung cancer, monitor status post SBRT. * Tracking Code: BO * EXAM: CT CHEST WITH CONTRAST TECHNIQUE: Multidetector CT imaging of the chest was performed during intravenous contrast administration. RADIATION DOSE REDUCTION: This exam was performed according to the departmental dose-optimization program which includes automated exposure control, adjustment of the mA and/or kV according to patient size and/or use of iterative reconstruction technique. CONTRAST:  75mL OMNIPAQUE  IOHEXOL  300 MG/ML  SOLN COMPARISON:  Multiple priors including CT May 04, 2023 FINDINGS: Cardiovascular: Aortic atherosclerosis. Normal size heart. Coronary artery calcifications. Mediastinum/Nodes: No suspicious thyroid nodule. No pathologically enlarged mediastinal, hilar or axillary lymph nodes. Similar right hilar/mediastinal soft tissue thickening. Lungs/Pleura: Similar right perihilar consolidation with volume loss, architectural distortion and bronchiectasis. Similar architectural distortion with interstitial thickening and bronchiectasis/bronchiolectasis in the medial left upper lobe. New ground-glass in the medial right lower lobe with multiple new bilateral pulmonary nodules. For reference: -Right lower lobe nodule measures 9 mm on image 65/302 -right lower lobe nodule measures 7 mm on image 69/302 -irregular left lower lobe nodule measures 10 mm on image 75/302. Stable small right pleural effusion Upper Abdomen: Right upper pole renal cysts. Stable hypodense lesion in the spleen on image 93/301 Musculoskeletal: No aggressive lytic or blastic lesion of bone. IMPRESSION: 1. New ground-glass in the medial right lower lobe with multiple new bilateral pulmonary nodules measuring up to 10 mm, favored an infectious or inflammatory process but nonspecific, suggest attention on short-term interval follow-up imaging. 2. Similar right perihilar consolidation with volume loss, architectural distortion  and bronchiectasis, compatible with postradiation change. 3. Similar architectural distortion with interstitial thickening and bronchiectasis/bronchiolectasis in the medial left upper lobe, compatible with postradiation change. 4. Stable small right pleural effusion. 5. Aortic atherosclerosis. Aortic  Atherosclerosis (ICD10-I70.0). Electronically Signed   By: Reyes Holder M.D.   On: 11/12/2023 15:17       IMPRESSION/PLAN: 1. Putative Stage IA, cT1aN0M0, NSCLC of the RLL. The paitent's recent imaging is reassuring in the RLL region and he appears to be without disease. He does have new nodules in both lungs, but the findings also correlate to a recent pneumonia that has been treated. We discussed however that with his history of putative lung cancer, and with his history of interstitial lung disease, I would recommend a short interval follow up CT in about 4 months time. He is in agreement with this plan.  2. Interstitial lung disease. The patient will continue to follow up with Dr. [name] and will call to schedule his next follow up appointment. 3. Pancreatic duct dilatation. He will follow up with his providers at [facility] and notes from his scan suggested additional imaging with MRI in 2027  to follow this site. 4. Elevated istat creatinine. We discussed his istat creatinine on the date of his CT was 1.3 and has been trending upward. I will send a copy of my note to his PCP to alert him of this in case he feels the patient needs additional work up or management, but if his creatinine goes up further, we may need to have scans without IV contrast.   This encounter was conducted via telephone.  The patient has provided two factor identification and has given verbal consent for this type of encounter and has been advised to only accept a meeting of this type in a secure network environment. The time spent during this encounter was 35 minutes including preparation, discussion, and  coordination of the patient's care. The attendants for this meeting include Donald Estefana Husband  and Carlin Jacquet and [name] Jacquet.  During the encounter,   Donald Estefana Husband was located at [facility] in the Radiation Oncology Department. Dario Yono was located at home.    Donald KYM Husband, [facility]   **Disclaimer: This note was dictated with voice recognition software. Similar sounding words can inadvertently be transcribed and this note may contain transcription errors which may not have been corrected upon publication of note.**

## 2023-11-17 ENCOUNTER — Telehealth: Payer: Self-pay

## 2023-11-17 NOTE — Telephone Encounter (Signed)
 Copied from CRM #[id number]. Topic: Clinical - Request for Lab/Test Order >> Nov 17, 2023  9:37 AM Celestine FALCON wrote: Reason for CRM: Pt stated Dr. [name] may want his to complete a PFT, but I didn't see any active request/order in the pt's appt desk. Please confirm if this is needed before his follow up in October with Dr. [name].  Pt's phone number is [phone] ok to leave a vm.    Spoke w/ Pt he would like to know if Mannam would like a PFT done since breathing has changedf since last one.

## 2023-11-17 NOTE — Telephone Encounter (Signed)
 Okay to order PFTs to be done before his return clinic visit in October.

## 2023-11-18 NOTE — Telephone Encounter (Signed)
 Called and spoke with the patient, he has been scheduled PFT 9/30 and is aware. Pt verbalized understanding. Nfn

## 2023-11-23 ENCOUNTER — Telehealth: Payer: Self-pay | Admitting: *Deleted

## 2023-11-23 NOTE — Telephone Encounter (Signed)
 Called patient to inform of CT for 02-18-24- arrival time- 10:45 am @ [facility], no restrictions to scan, patient to receive results from Donald Husband via telephone on 02-29-24 @ 1 pm , lvm for a return call

## 2023-12-02 ENCOUNTER — Telehealth: Payer: Self-pay | Admitting: *Deleted

## 2023-12-02 NOTE — Telephone Encounter (Signed)
 RETURNED PATIENT'S PHONE CALL, SPOKE WITH PATIENT. ?

## 2023-12-24 ENCOUNTER — Encounter (HOSPITAL_COMMUNITY)

## 2023-12-25 ENCOUNTER — Ambulatory Visit (HOSPITAL_COMMUNITY)
Admission: RE | Admit: 2023-12-25 | Discharge: 2023-12-25 | Disposition: A | Discharge status: Home / Self Care | Source: Ambulatory Visit | Attending: Vascular Surgery | Admitting: Vascular Surgery

## 2023-12-25 ENCOUNTER — Other Ambulatory Visit: Payer: Self-pay | Admitting: *Deleted

## 2023-12-25 ENCOUNTER — Other Ambulatory Visit (HOSPITAL_COMMUNITY): Payer: Self-pay | Admitting: Internal Medicine

## 2023-12-25 DIAGNOSIS — I70209 Unspecified atherosclerosis of native arteries of extremities, unspecified extremity: Secondary | ICD-10-CM | POA: Insufficient documentation

## 2023-12-25 DIAGNOSIS — J849 Interstitial pulmonary disease, unspecified: Secondary | ICD-10-CM

## 2023-12-25 LAB — VAS US ABI WITH/WO TBI
Left ABI: 1.18
Right ABI: 1.2

## 2023-12-29 ENCOUNTER — Ambulatory Visit (INDEPENDENT_AMBULATORY_CARE_PROVIDER_SITE_OTHER): Admitting: *Deleted

## 2023-12-29 DIAGNOSIS — J849 Interstitial pulmonary disease, unspecified: Secondary | ICD-10-CM

## 2023-12-29 LAB — PULMONARY FUNCTION TEST
DL/VA % pred: 91 %
DL/VA: 3.59 ml/min/mmHg/L
DLCO cor % pred: 55 %
DLCO cor: 12.68 ml/min/mmHg
DLCO unc % pred: 55 %
DLCO unc: 12.68 ml/min/mmHg
FEF 25-75 Post: 2.49 L/s
FEF 25-75 Pre: 2.28 L/s
FEF2575-%Change-Post: 9 %
FEF2575-%Pred-Post: 139 %
FEF2575-%Pred-Pre: 127 %
FEV1-%Change-Post: 4 %
FEV1-%Pred-Post: 77 %
FEV1-%Pred-Pre: 74 %
FEV1-Post: 2.03 L
FEV1-Pre: 1.95 L
FEV1FVC-%Change-Post: 4 %
FEV1FVC-%Pred-Pre: 112 %
FEV6-%Change-Post: 1 %
FEV6-%Pred-Post: 70 %
FEV6-%Pred-Pre: 68 %
FEV6-Post: 2.41 L
FEV6-Pre: 2.37 L
FEV6FVC-%Change-Post: 1 %
FEV6FVC-%Pred-Post: 107 %
FEV6FVC-%Pred-Pre: 105 %
FVC-%Change-Post: 0 %
FVC-%Pred-Post: 65 %
FVC-%Pred-Pre: 65 %
FVC-Post: 2.42 L
FVC-Pre: 2.41 L
Post FEV1/FVC ratio: 84 %
Post FEV6/FVC ratio: 100 %
Pre FEV1/FVC ratio: 81 %
Pre FEV6/FVC Ratio: 98 %
RV % pred: 88 %
RV: 2.25 L
TLC % pred: 72 %
TLC: 4.81 L

## 2023-12-29 NOTE — Patient Instructions (Signed)
 Full PFT performed today.

## 2023-12-29 NOTE — Progress Notes (Signed)
 Full PFT performed today.

## 2023-12-31 ENCOUNTER — Ambulatory Visit: Admitting: Pulmonary Disease

## 2024-01-06 ENCOUNTER — Encounter: Payer: Self-pay | Admitting: Pulmonary Disease

## 2024-01-06 ENCOUNTER — Ambulatory Visit: Admitting: Pulmonary Disease

## 2024-01-06 VITALS — BP 124/70 | HR 58 | Temp 97.7°F | Ht 68.0 in | Wt 198.0 lb

## 2024-01-06 DIAGNOSIS — R9389 Abnormal findings on diagnostic imaging of other specified body structures: Secondary | ICD-10-CM

## 2024-01-06 DIAGNOSIS — J849 Interstitial pulmonary disease, unspecified: Secondary | ICD-10-CM

## 2024-01-06 NOTE — Progress Notes (Signed)
 Peter Greene    [id number]    Jul 26, 1940  Primary Care Physician:Aronson, Charlie, MD  Referring Physician: Shepard Charlie, MD [address] [place],  [place] [address]   Chief complaint: Follow up for interstitial lung disease, lung nodule  HPI: 83 y.o. with history of laryngeal cancer, atrial fibrillation, myelodysplastic syndrome.  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  Used to follow with Dr.Thomas J. Tad Jungling, pulmonary in [place]  and was told that he had idiopathic pneumonia treated with a prolonged steroid taper.  Reviewed notes from Dr. Jungling who did not have much details on the note but he is being treated for idiopathic pneumonia with prolonged steroid taper.  There is no mention of biopsy. He appears to have had surgical lung biopsy in the past but the patient does not recall the date of the procedure.  He is currently not on any specific treatments for interstitial lung disease and transferred care in 2023 as he moved [place] to be closer to his family.  Noted to have enlarging right lower lobe nodule with positive PET signal Underwent navigational bronchoscopy twice in January and March 2024 to which unfortunately was non diagnostic.  Case has been discussed at tumor board and evaluated by cardiothoracic surgery, radiation oncology.  He eventually underwent empiric SBRT to that area in June 2024.  Interim history: Discussed the use of AI scribe software for clinical note transcription with the patient, who gave verbal consent to proceed.  History of Present Illness Peter Greene is an 83 year old male with interstitial lung disease who presents for follow-up of his pulmonary condition.  Dyspnea and exercise tolerance - Mild dyspnea when carrying a heavy briefcase  up stairs - Able to walk two miles without difficulty - Increased exertion difficulty on the elliptical compared to the stationary bike - Increased dyspnea when carrying extra weight - Engages in regular physical activity, including gym workouts and golf - Monitors heart rate during exercise  Pulmonary parenchymal disease - Interstitial lung disease with scarring in the right lung due to graft versus host disease and post-radiation changes - History of radiation therapy one year ago for a nodule in the right lower lobe, which was stable on follow-up imaging  Recurrent lower respiratory tract infections - Two episodes of pneumonia in the past nine months - Symptoms during episodes include cough and slight fever - Managed with antibiotics including Levaquin and cefdinir - Most recent episode occurred in August, coinciding with CT scan showing inflammatory changes - Has a prescription for Levaquin for use if symptoms arise while traveling  Upper airway symptoms - Persistent nasal congestion, particularly at night - Managed with Flonase and Nasonex  Preventive care - Received vaccinations for influenza and pneumonia    Relevant pulmonary history Pets: No pets Occupation: Retired Firefighter Exposures: No mold exposures.  No mold, hot tub, Jacuzzi.  No feather pillows or comforters ILD questionnaire 08/20/2021-negative Smoking history: 20-pack-year smoker.  Quit in 1984 Travel history: Used to live in [place] , [place] .  No significant recent travel Relevant family history: No family history of lung disease  Outpatient Encounter Medications as of 01/06/2024  Medication Sig   aspirin  EC 81 MG tablet Take 81 mg by mouth in the morning. Swallow whole.   atorvastatin  (LIPITOR) 40 MG tablet Take 40 mg by mouth  in the morning.   ELIQUIS  5 MG TABS tablet Take 5 mg by mouth 2 (two) times daily.   ipratropium (ATROVENT) 0.03 % nasal spray Place 2 sprays into both nostrils daily.    Krill Oil 350 MG CAPS Take 350 mg by mouth every evening.   levothyroxine  (SYNTHROID ) 25 MCG tablet Take 25 mcg by mouth every evening. (Patient taking differently: Take 25 mcg by mouth every evening. Taking 50 mcg)   metoprolol  tartrate (LOPRESSOR ) 25 MG tablet Take 25 mg by mouth 2 (two) times daily.   Multiple Vitamin (MULTIVITAMIN WITH MINERALS) TABS tablet Take 1 tablet by mouth in the morning. Centrum   pantoprazole  (PROTONIX ) 20 MG tablet Take 20 mg by mouth every evening.   tamsulosin  (FLOMAX ) 0.4 MG CAPS capsule Take 0.4 mg by mouth in the morning.   valACYclovir  (VALTREX ) 500 MG tablet Take 500 mg by mouth in the morning.   acetaminophen  (TYLENOL ) 500 MG tablet Take 500 mg by mouth every 6 (six) hours as needed for moderate pain.   No facility-administered encounter medications on file as of 01/06/2024.   Vitals:   01/06/24 1104  BP: 124/70  Pulse: (!) 58  Temp: 97.7 F (36.5 C)  Height: 5' 8 (1.727 m)  Weight: 198 lb (89.8 kg)  SpO2: 93%  TempSrc: Oral  BMI (Calculated): 30.11    Physical Exam MEASUREMENTS: Height- 5'8, Weight- 195. GEN: No acute distress. CV: Regular rate and rhythm, no murmurs. LUNGS: Clear to auscultation bilaterally, normal respiratory effort. SKIN JOINTS: Warm and dry, no rash.    Data Reviewed: Imaging: CT chest 08/11/2021-multifocal areas of consolidation mostly in the right middle lobe and lower lobes.  Heterogeneous peripheral reticulation.    High resolution CT 02/25/2022-upper lobe reticular groundglass opacities with honeycomb changes, alternate diagnosis.  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  PET scan 03/23/2022-hypermetabolic right lower lobe nodule  CT scan 06/05/2022-enlargement of spiculated right lower lobe nodule, stable scarring in the lungs.  CT scan 09/09/2022-decrease in size of solid right lower lobe pulmonary nodule.  Stable nodule in the left upper lobe.  Right upper lobe peribronchovascular reticular changes  CT chest 12/10/2022-right  upper lobe lobe and superior right lower lobe interstitial opacities suggestive of postradiation changes.  CT chest 02/02/2023-Findings appear stable compared to September 2024  CT chest 11/12/2023-new ground glass nodule in right lower lobe.  Stable right perihilar consolidation, architectural distortion, bronchiectasis.  Small right pleural effusion. I have reviewed the images below.  PFTs: 01/01/2022 FVC 2.54 [6 7%], FEV1 2.19 [81%], F/F 86, TLC 4.16 [62%], DLCO 13.02 [56%]  06/11/2022 FVC 2.75 [73%], FEV1 2.35 [88%], F/F85, TLC 5.13 [77%], DLCO 13.62 [59%] Moderate restriction, diffusion defect.  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  He is currently not on treatment now.   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 Slight decrease in diffusion capacity, but overall lung volumes remain stable. - Encourage exercise to improve aerobic capacity, aiming for a heart rate of 120-125 bpm for 5-10 minutes, gradually increasing to 15-20 minutes, three times a week. - Encourage weight loss to improve respiratory function. - Recommend  COVID and RSV vaccinations due to underlying lung issues.  Lung malignancy Enlarged lymph node concerning for malignancy due to PET avidity and enlargement in size. S/p SBRT.  Follow-up with radiation oncology.   Recurrent pneumonia Episodes approximately every three months, likely due to weakened lungs and immune system. Recent CT in August  showed inflammatory changes consistent with pneumonia, treated with antibiotics. Continues to be at risk for recurrent infections. - Follow-up scan already ordered by radiation oncology in November 2025 - Encourage preventive measures such as hand washing, avoiding crowded places, and staying up to date with vaccinations.  Chronic nasal congestion Persistent nasal congestion with post-nasal drip, not fully relieved by nasal sprays. - Try Zyrtec or Claritin once daily to manage symptoms.  Follow-up in 6 months.   Plan/Recommendations: Continue monitoring Follow-up scan already ordered for Nov 2025 Return to clinic in 6 months  I personally spent a total of 32 minutes in the care of the patient today including preparing to see the patient, getting/reviewing separately obtained history, performing a medically appropriate exam/evaluation, counseling and educating, independently interpreting results, and communicating results.   Lonna Coder MD [facility] 01/06/2024, 11:21 AM  CC: [name], MD

## 2024-01-06 NOTE — Patient Instructions (Signed)
  VISIT SUMMARY: You had a follow-up visit to discuss your interstitial lung disease and related symptoms. We reviewed your exercise tolerance, lung condition, history of respiratory infections, and nasal congestion. We also discussed preventive care measures.  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 Your lung function has been stable with mild to moderate reduction in capacity. -Continue exercising to improve aerobic capacity. Aim for a heart rate of 120-125 bpm for 5-10 minutes, gradually increasing to 15-20 minutes, three times a week. -Work on weight loss to improve respiratory function. -Get COVID and RSV vaccinations due to your underlying lung issues.  RECURRENT PNEUMONIA: You have had episodes of pneumonia approximately every three months, likely due to weakened lungs and immune system. -Continue to see Dr. Shepard for acute respiratory infections for quicker access. -Keep a prescription of Levaquin for travel or immediate need. -Practice preventive measures such as hand washing, avoiding crowded places, and staying up to date with vaccinations.  CHRONIC NASAL CONGESTION: You have persistent nasal congestion with post-nasal drip, not fully relieved by nasal sprays. -Try taking Zyrtec or Claritin once daily to manage symptoms.

## 2024-01-22 ENCOUNTER — Other Ambulatory Visit (HOSPITAL_COMMUNITY): Payer: Self-pay | Admitting: Urology

## 2024-01-22 DIAGNOSIS — C61 Malignant neoplasm of prostate: Secondary | ICD-10-CM

## 2024-01-25 NOTE — Therapy (Signed)
 OUTPATIENT PHYSICAL THERAPY THORACOLUMBAR EVALUATION   Patient Name: Peter Greene MRN: [id number] DOB:07-24-1940, 83 y.o., male Today's Date: 01/26/2024  END OF SESSION:  PT End of Session - 01/26/24 1117     Visit Number 1    Date for Recertification  03/22/24    Authorization Type Medicare    PT Start Time 1120    PT Stop Time 1200    PT Time Calculation (min) 40 min          Past Medical History:  Diagnosis Date   AAA (abdominal aortic aneurysm)    Anemia    Carotid artery occlusion    Complication of anesthesia    difficult to intubate   Dysrhythmia    a-fib   GERD (gastroesophageal reflux disease)    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   Hyperlipidemia    atorvastatin    Hypothyroidism    Interstitial lung disease (HCC)    Laryngeal cancer (HCC) 2004   Neuromuscular disorder (HCC)    bilateral feet neuropathy   Pneumonia 07/2021   x 2   Primary cancer of bone marrow (HCC)    Skin cancer    Head   Thyroid disease    Thyroid nodule 2024   Right lower lobe   Past Surgical History:  Procedure Laterality Date   ABDOMINAL AORTIC ANEURYSM REPAIR     BONE MARROW TRANSPLANT     BRONCHIAL BIOPSY  04/17/2022   Procedure: BRONCHIAL BIOPSIES;  Surgeon: Gladis Leonor HERO, MD;  Location: [facility] ENDOSCOPY;  Service: Pulmonary;;   BRONCHIAL BIOPSY  06/19/2022   Procedure: BRONCHIAL BIOPSIES;  Surgeon: Gladis Leonor HERO, MD;  Location: [facility] ENDOSCOPY;  Service: Pulmonary;;   BRONCHIAL BRUSHINGS  04/17/2022   Procedure: BRONCHIAL BRUSHINGS;  Surgeon: Gladis Leonor HERO, MD;  Location: [facility] ENDOSCOPY;  Service: Pulmonary;;   BRONCHIAL NEEDLE ASPIRATION BIOPSY  04/17/2022   Procedure: BRONCHIAL NEEDLE ASPIRATION BIOPSIES;  Surgeon: Gladis Leonor HERO, MD;  Location: [facility] ENDOSCOPY;  Service: Pulmonary;;   BRONCHIAL NEEDLE ASPIRATION BIOPSY  06/19/2022   Procedure: BRONCHIAL NEEDLE ASPIRATION BIOPSIES;  Surgeon: Gladis Leonor HERO, MD;  Location: [facility] ENDOSCOPY;   Service: Pulmonary;;   BRONCHIAL WASHINGS  04/17/2022   Procedure: BRONCHIAL WASHINGS;  Surgeon: Gladis Leonor HERO, MD;  Location: [facility] ENDOSCOPY;  Service: Pulmonary;;   BRONCHIAL WASHINGS  06/19/2022   Procedure: BRONCHIAL WASHINGS;  Surgeon: Gladis Leonor HERO, MD;  Location: [facility] ENDOSCOPY;  Service: Pulmonary;;   CARDIAC CATHETERIZATION     ENDARTERECTOMY Left 01/30/2022   Procedure: ENDARTERECTOMY CAROTID;  Surgeon: Lanis Fonda BRAVO, MD;  Location: [facility] OR;  Service: Vascular;  Laterality: Left;   EYE SURGERY     FIDUCIAL MARKER PLACEMENT  06/19/2022   Procedure: FIDUCIAL MARKER PLACEMENT;  Surgeon: Gladis Leonor HERO, MD;  Location: [facility] ENDOSCOPY;  Service: Pulmonary;;   HERNIA REPAIR     KNEE SURGERY Bilateral    Joint replacements   MOHS SURGERY  2024   x 2 R & L   PATCH ANGIOPLASTY Left 01/30/2022   Procedure: PATCH ANGIOPLASTY WITH XENOSURE BIOLOGIC PATCH 1CMX6CM;  Surgeon: Lanis Fonda BRAVO, MD;  Location: [facility] OR;  Service: Vascular;  Laterality: Left;   TONSILLECTOMY  1946   VIDEO BRONCHOSCOPY WITH RADIAL ENDOBRONCHIAL ULTRASOUND  04/17/2022   Procedure: VIDEO BRONCHOSCOPY WITH RADIAL ENDOBRONCHIAL ULTRASOUND;  Surgeon: Gladis Leonor HERO, MD;  Location: [facility] ENDOSCOPY;  Service: Pulmonary;;   VIDEO BRONCHOSCOPY WITH RADIAL ENDOBRONCHIAL ULTRASOUND  06/19/2022   Procedure: VIDEO BRONCHOSCOPY WITH RADIAL ENDOBRONCHIAL ULTRASOUND;  Surgeon: Gladis Leonor HERO, MD;  Location: [facility] ENDOSCOPY;  Service: Pulmonary;;   Patient Active Problem List   Diagnosis Date Noted   Malignant neoplasm of lower lobe of right lung (HCC) 07/07/2022   Pulmonary nodule 04/17/2022   AAA (abdominal aortic aneurysm) 01/30/2022   Carotid artery stenosis 01/30/2022   Carotid stenosis, asymptomatic 01/30/2022   Rhinovirus infection 08/12/2021   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   Acute respiratory failure with hypoxia (HCC) 08/12/2021   PNA (pneumonia) 08/11/2021   Sepsis due to pneumonia (HCC)  08/11/2021   Macrocytic anemia 08/11/2021   Hypothyroidism 08/11/2021   HTN (hypertension) 08/11/2021   ILD (interstitial lung disease) (HCC) 08/11/2021   A-fib (HCC) 08/11/2021    PCP: Charlie Love  REFERRING PROVIDER: Charlie Love  REFERRING DIAG:  M54.9 (ICD-10-CM) - Dorsalgia, unspecified  R26.81 (ICD-10-CM) - Unsteadiness on feet  M79.89 (ICD-10-CM) - Other specified soft tissue disorders    Rationale for Evaluation and Treatment: Rehabilitation  THERAPY DIAG:  No diagnosis found.  ONSET DATE: about 3 years ago  SUBJECTIVE:                                                                                                                                                                                           SUBJECTIVE STATEMENT: I am okay. Here for neuropathy, have some back pain but not too much. The neuropathy I have had fro about 3 years. I have noticed my balance getting worse. When I go to the gym I can press heavy but I can't get up from a chair.   PERTINENT HISTORY:  Peripheral neuropathy and balance issues Bilateral knee surgery   PAIN:  Are you having pain? Yes: NPRS scale: 5/10 Pain location: bilateral thighs Pain description: dull ache, some tingling Aggravating factors: end of day is worse Relieving factors: nothing really  PRECAUTIONS: None  RED FLAGS: None   WEIGHT BEARING RESTRICTIONS: No  FALLS:  Has patient fallen in last 6 months? No  LIVING ENVIRONMENT: Lives with: lives with their spouse Lives in: House/apartment Stairs: Yes: Internal: 15 steps; on right going up and External: 2 steps; can reach both Has following equipment at home: Single point cane and Walker - 2 wheeled  OCCUPATION: Retired  PLOF: Independent and Independent with gait  PATIENT GOALS: I liked to make it better   NEXT MD VISIT:   OBJECTIVE:  Note: Objective measures were completed at Evaluation unless otherwise noted.  DIAGNOSTIC FINDINGS:     COGNITION: Overall cognitive status: Within functional limits for tasks assessed     SENSATION: Light touch: Impaired   MUSCLE LENGTH: Hamstrings: tightness in BLE  POSTURE: rounded shoulders   LUMBAR ROM:   AROM eval  Flexion 75% tightness in HS  Extension 75% a little pain  Right lateral flexion 50% some tightness  Left lateral flexion 50% some tightness  Right rotation WFL  Left rotation WFL   (Blank rows = not tested)  LOWER EXTREMITY ROM:     Active  Right eval Left eval  Hip flexion WNL WNL  Hip extension    Hip abduction    Hip adduction    Hip internal rotation    Hip external rotation    Knee flexion 100 105  Knee extension [facility]  Ankle dorsiflexion [facility] WFL  Ankle plantarflexion [facility] WFL  Ankle inversion    Ankle eversion     (Blank rows = not tested)  LOWER EXTREMITY MMT:  5/5 BLE  FUNCTIONAL TESTS:  5 times sit to stand: 11.42s from chair Timed up and go (TUG): 10s BERG 46/56  TREATMENT DATE: 01/26/24 EVAL   PATIENT EDUCATION:  Education details: POC and HEP Person educated: Patient Education method: Explanation Education comprehension: verbalized understanding and returned demonstration  HOME EXERCISE PROGRAM: Access Code: [email or web address] URL: [email or web address] Date: 01/26/2024 Prepared by: Almetta Fam  Exercises - Standing Tandem Balance with Counter Support  - 1 x daily - 7 x weekly - 5 reps - 30 hold - Standing Single Leg Stance with Counter Support  - 1 x daily - 7 x weekly - 5 reps - 10 hold - Seated Hamstring Stretch  - 1 x daily - 7 x weekly - 2 reps - 15 hold  ASSESSMENT:  CLINICAL IMPRESSION: Patient is a 83 y.o. male who was seen today for physical therapy evaluation and treatment for bilateral neuropathy. He reports going to the gym 3x a week. He is also golfing 1-2x a week. He has been experiencing neuropathic pain for about 3 years now. With balance testing, he does have some areas where we can help  improve his balance and stability. Patient does have some limited lumbar ROM, mostly due to tightness in low back and hamstrings. He has had bilateral knee replacement and is missing flexion ROM on both side. He will benefit from skilled PT to work on his balance and be able to do his regular exercises without increased neuropathy.   OBJECTIVE IMPAIRMENTS: decreased balance, decreased ROM, impaired flexibility, and pain.   ACTIVITY LIMITATIONS: bending and locomotion level  PARTICIPATION LIMITATIONS: community activity, yard work, and gym activities  PERSONAL FACTORS: Time since onset of injury/illness/exacerbation and 1-2 comorbidities: prostate cancer, lung disease, NM disorder are also affecting patient's functional outcome.   REHAB POTENTIAL: Good  CLINICAL DECISION MAKING: Stable/uncomplicated  EVALUATION COMPLEXITY: Low  GOALS: Goals reviewed with patient? Yes  SHORT TERM GOALS: Target date: 02/23/24  Patient will be independent with initial HEP. Baseline:  Goal status: INITIAL   LONG TERM GOALS: Target date: 03/22/24  Patient will be independent with advanced/ongoing HEP to improve outcomes and carryover.  Baseline:  Goal status: INITIAL  Patient will demonstrate full lumbar ROM Baseline: see chart Goal status: INITIAL  3.  Patient will demonstrate tandem stance on firm and foam >30s.  Baseline:  Goal status: INITIAL  4.  Patient will demonstrate SLS on firm and foam >10s Baseline:  Goal status: INITIAL  5.  Patient will score 52 or better on Berg Balance test to demonstrate lower risk of falls. Baseline: 46/56 Goal status: INITIAL  PLAN:  PT FREQUENCY: 1x/week  PT DURATION: 8 weeks  PLANNED INTERVENTIONS:  97110-Therapeutic exercises, 97530- Therapeutic activity, [id number]- Neuromuscular re-education, 97535- Self Care, 02859- Manual therapy, [phone]- Gait training, [phone] (1-2 muscles), 20561 (3+ muscles)- Dry Needling, Patient/Family education, Balance training,  Stair training, Joint mobilization, Cryotherapy, and Moist heat.  PLAN FOR NEXT SESSION: balance training on foam surfaces, tandem and SLS, head turns, stretching for tight hip and hamstrings    Almetta Fam, PT 01/26/2024, 11:59 AM

## 2024-01-26 ENCOUNTER — Ambulatory Visit: Attending: Internal Medicine

## 2024-01-26 DIAGNOSIS — Z7409 Other reduced mobility: Secondary | ICD-10-CM | POA: Insufficient documentation

## 2024-01-26 DIAGNOSIS — R2689 Other abnormalities of gait and mobility: Secondary | ICD-10-CM | POA: Insufficient documentation

## 2024-01-26 DIAGNOSIS — G629 Polyneuropathy, unspecified: Secondary | ICD-10-CM | POA: Insufficient documentation

## 2024-01-27 NOTE — Progress Notes (Signed)
 Assessment and Plan: 1. Dry eye syndrome of both lacrimal glands Osmo: Mild OU + inflammadry ou Moderate MG atrophy ou Plugs placed LL ou Continue Cequa BID ou Evo tears BID ou WC daily Refresh Mega 3 2-4 x daily  07/22/23 Osmo: Normal ou +inflammadry ou Continue Cequa BID ou Evo tears BID ou WC daily Refresh Mega 3 2-4 x daily  01/27/24 Osmo: 344/337 elevated ou Stopped Cequa 3 months ago Evo tears prn WC daily Refresh Mega 3 2-4 x daily Blink Nutritears supplement po   2. 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 Pseudophakia of both eyes Doing well Monitor     The plan outlined above was explained in detail to the patient and they expressed understanding. All questions were answered.  The Chief Complaint, HPI, ROS and PFSHx as documented were reviewed and I agree as documented or revised as indicated.  Return in about 1 year (around 01/26/2025) for Dry eye check., sooner PRN increased redness or pain, or any change or concerns about eyes.   This note is written by MITZIE FINDER, in the presence of and acting as the scribe of Dr. ELEANOR ANN MAC, OD.

## 2024-01-28 NOTE — ED Triage Notes (Signed)
 Pt arrived to ED with c/o generalized chest discomfort that started last night while sleeping. Pt reports shortness of breath with normal activity. Pt c/o pain left knee without injury.  Pt reports started on Orgovyx yesterday.

## 2024-01-29 ENCOUNTER — Ambulatory Visit: Admitting: Physical Therapy

## 2024-02-01 NOTE — Progress Notes (Incomplete)
 GU Location of Tumor / Histology: Prostate Ca  If Prostate Cancer, Gleason Score is (4 + 4) and PSA is (6.1 on 11/04/2023)  Carlin Jacquet presented as referral from Dr. Donnice Siad [facility]) elevated PSA.  Biopsies     02/02/2024 Dr. Donnice Siad NM PET (PSMA) Skull to Mid Thigh CLINICAL DATA:    Past/Anticipated interventions by urology, if any:  Dr. Donnice Siad  Past/Anticipated interventions by medical oncology, if any: NA  Weight changes, if any: {:18581}  IPSS: SHIM:  Bowel/Bladder complaints, if any: {:18581}   Nausea/Vomiting, if any: {:18581}  Pain issues, if any:  {:18581}  SAFETY ISSUES: Prior radiation? {:18581} Pacemaker/ICD? {:18581} Possible current pregnancy? Male Is the patient on methotrexate? No  Current Complaints / other details:    30 minutes spent total, including time for meaningful use questions, reviewing medication, as well as spent in face-to-face time in nurse evaluation with the patient.

## 2024-02-02 ENCOUNTER — Encounter (HOSPITAL_COMMUNITY)
Admission: RE | Admit: 2024-02-02 | Discharge: 2024-02-02 | Disposition: A | Discharge status: Home / Self Care | Source: Ambulatory Visit | Attending: Urology | Admitting: Urology

## 2024-02-02 DIAGNOSIS — C61 Malignant neoplasm of prostate: Secondary | ICD-10-CM | POA: Insufficient documentation

## 2024-02-02 MED ORDER — FLOTUFOLASTAT F 18 GALLIUM 296-5846 MBQ/ML IV SOLN
8.6400 | Freq: Once | INTRAVENOUS | Status: AC
  Administered 2024-02-02: 8.64 via INTRAVENOUS

## 2024-02-04 ENCOUNTER — Ambulatory Visit: Admitting: Physical Therapy

## 2024-02-04 DIAGNOSIS — C61 Malignant neoplasm of prostate: Secondary | ICD-10-CM | POA: Insufficient documentation

## 2024-02-04 NOTE — Progress Notes (Signed)
 Radiation Oncology         [phone] ________________________________  Initial Outpatient Consultation  Name: Peter Greene MRN: [id number]  Date: 02/05/2024  DOB: October 23, 1940  RR:Jmnwdnw, Charlie, MD  Peter Donnice SAUNDERS, MD   REFERRING PHYSICIAN: Selma Donnice SAUNDERS, MD  DIAGNOSIS: 83 y.o. 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    ICD-10-CM   1. Malignant neoplasm of prostate (HCC)  C61       HISTORY OF PRESENT ILLNESS: Peter Greene is a 83 y.o. male with a diagnosis of prostate cancer and a history of putative Stage IA, cT1aN0M0, NSCLC of the RLL. The lung cancer was treated with SBRT under the care of Dr. Dewey in 07/2022 and follow up imaging has not shown evidence of disease recurrence. Regarding the prostate cancer, he was initially being followed by Dr. MacDiarmid for BPH with LUTS.  The LUTS did improve with Vesicare daily but have since become less effective so he is no longer taking the medication. He does take Flomax  daily for many years. He was noted to have an elevated PSA at 4.04 in April 2024 and this increased to 6.10 in April 2025 with a right apex nodule on DRE. Accordingly, he was referred for evaluation by 1 of Dr. Thane partners, Dr. Selma on 11/03/23,  digital rectal examination performed at that time showed 2 nodules in the right hemiprostate.  Therefore, the patient proceeded to transrectal ultrasound with 12 biopsies of the prostate on 01/12/2024.  The prostate volume measured 50 cc.  Out of 12 core biopsies, 5 were positive.  The maximum Gleason score was 4+4, and this was seen in the right apex.  Additionally, Gleason 4+3 was seen in the right base, right mid, right mid lateral and right apex lateral.  A PSMA PET scan was performed on 02/02/2024 for disease staging and did confirm a dominant intraprostatic nodule on the right which correlates with his pathology findings but was without any evidence of  metastatic disease in the abdomen or pelvis and no skeletal metastases.  Incidentally noted was a right upper lobe mass with peripheral atelectasis at prior treatment site of a primary lung cancer.  The activity is nonspecific and could represent residual or recurrent lung cancer versus postradiation inflammation.  We will share this finding with Donald Husband, PA-C who follows him for the lung cancer. He is already scheduled for follow up chest CT imaging on 02/22/24 and a telephone follow up with Donald on 02/29/24.    The patient reviewed the biopsy and imaging results with his urologist and he has kindly been referred today for discussion of potential radiation treatment options. He has also started taking Orgovyx ADT approximately 1 week ago and feels that he is tolerating this well at present. He is accompanied by his wife, Peter Greene, for today's visit.  PREVIOUS RADIATION THERAPY: Yes  08/21/22-08/28/22: Plan Name: Lung_R_SBRT Site: Lung, RLL Technique: SBRT/SRT-IMRT Mode: Photon Dose Per Fraction: 18 Gy Prescribed Dose (Delivered / Prescribed): 54 Gy / 54 Gy Prescribed Fxs (Delivered / Prescribed): 3 / 3   2020 at [facility] details unknown but pt recalls full body radiotherapy in lieu of bone marrow transplant   2004 Laryngeal Cancer in [place]  details unknown  PAST MEDICAL HISTORY:  Past Medical History:  Diagnosis Date   AAA (abdominal aortic aneurysm)    Anemia    Carotid artery occlusion    Complication of anesthesia    difficult to  intubate   Dysrhythmia    a-fib   GERD (gastroesophageal reflux disease)    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   Hyperlipidemia    atorvastatin    Hypothyroidism    Interstitial lung disease (HCC)    Laryngeal cancer (HCC) 2004   Neuromuscular disorder (HCC)    bilateral feet neuropathy   Pneumonia 07/2021   x 2   Primary cancer of bone marrow (HCC)    Skin cancer    Head   Thyroid disease    Thyroid nodule 2024    Right lower lobe      PAST SURGICAL HISTORY: Past Surgical History:  Procedure Laterality Date   ABDOMINAL AORTIC ANEURYSM REPAIR     BONE MARROW TRANSPLANT     BRONCHIAL BIOPSY  04/17/2022   Procedure: BRONCHIAL BIOPSIES;  Surgeon: Gladis Leonor HERO, MD;  Location: [facility] ENDOSCOPY;  Service: Pulmonary;;   BRONCHIAL BIOPSY  06/19/2022   Procedure: BRONCHIAL BIOPSIES;  Surgeon: Gladis Leonor HERO, MD;  Location: [facility] ENDOSCOPY;  Service: Pulmonary;;   BRONCHIAL BRUSHINGS  04/17/2022   Procedure: BRONCHIAL BRUSHINGS;  Surgeon: Gladis Leonor HERO, MD;  Location: [facility] ENDOSCOPY;  Service: Pulmonary;;   BRONCHIAL NEEDLE ASPIRATION BIOPSY  04/17/2022   Procedure: BRONCHIAL NEEDLE ASPIRATION BIOPSIES;  Surgeon: Gladis Leonor HERO, MD;  Location: [facility] ENDOSCOPY;  Service: Pulmonary;;   BRONCHIAL NEEDLE ASPIRATION BIOPSY  06/19/2022   Procedure: BRONCHIAL NEEDLE ASPIRATION BIOPSIES;  Surgeon: Gladis Leonor HERO, MD;  Location: [facility] ENDOSCOPY;  Service: Pulmonary;;   BRONCHIAL WASHINGS  04/17/2022   Procedure: BRONCHIAL WASHINGS;  Surgeon: Gladis Leonor HERO, MD;  Location: [facility] ENDOSCOPY;  Service: Pulmonary;;   BRONCHIAL WASHINGS  06/19/2022   Procedure: BRONCHIAL WASHINGS;  Surgeon: Gladis Leonor HERO, MD;  Location: [facility] ENDOSCOPY;  Service: Pulmonary;;   CARDIAC CATHETERIZATION     ENDARTERECTOMY Left 01/30/2022   Procedure: ENDARTERECTOMY CAROTID;  Surgeon: Lanis Fonda BRAVO, MD;  Location: [facility] OR;  Service: Vascular;  Laterality: Left;   EYE SURGERY     FIDUCIAL MARKER PLACEMENT  06/19/2022   Procedure: FIDUCIAL MARKER PLACEMENT;  Surgeon: Gladis Leonor HERO, MD;  Location: [facility] ENDOSCOPY;  Service: Pulmonary;;   HERNIA REPAIR     KNEE SURGERY Bilateral    Joint replacements   MOHS SURGERY  2024   x 2 R & L   PATCH ANGIOPLASTY Left 01/30/2022   Procedure: PATCH ANGIOPLASTY WITH GEORGE BIOLOGIC PATCH 1CMX6CM;  Surgeon: Lanis Fonda BRAVO, MD;  Location: [facility] OR;  Service: Vascular;  Laterality: Left;    TONSILLECTOMY  1946   VIDEO BRONCHOSCOPY WITH RADIAL ENDOBRONCHIAL ULTRASOUND  04/17/2022   Procedure: VIDEO BRONCHOSCOPY WITH RADIAL ENDOBRONCHIAL ULTRASOUND;  Surgeon: Gladis Leonor HERO, MD;  Location: [facility] ENDOSCOPY;  Service: Pulmonary;;   VIDEO BRONCHOSCOPY WITH RADIAL ENDOBRONCHIAL ULTRASOUND  06/19/2022   Procedure: VIDEO BRONCHOSCOPY WITH RADIAL ENDOBRONCHIAL ULTRASOUND;  Surgeon: Gladis Leonor HERO, MD;  Location: [facility] ENDOSCOPY;  Service: Pulmonary;;    FAMILY HISTORY: No family history on file.  SOCIAL HISTORY:  Social History   Socioeconomic History   Marital status: Married    Spouse name: Peter Greene   Number of children: 1   Years of education: Not on file   Highest education level: Not on file  Occupational History   Not on file  Tobacco Use   Smoking status: Former    Current packs/day: 0.00    Types: Cigarettes    Quit date: 1984    Years since quitting: 41.8    Passive exposure: Never  Smokeless tobacco: Never  Vaping Use   Vaping status: Never Used  Substance and Sexual Activity   Alcohol  use: Yes    Alcohol /week: 2.0 standard drinks of alcohol     Types: 2 Standard drinks or equivalent per week   Drug use: Never   Sexual activity: Not Currently  Other Topics Concern   Not on file  Social History Narrative   Not on file   Social Drivers of Health   Financial Resource Strain: Not on file  Food Insecurity: No Food Insecurity (05/11/2023)   Hunger Vital Sign    Worried About Running Out of Food in the Last Year: Never true    Ran Out of Food in the Last Year: Never true  Transportation Needs: No Transportation Needs (05/11/2023)   PRAPARE - Administrator, Civil Service (Medical): No    Lack of Transportation (Non-Medical): No  Physical Activity: Not on file  Stress: Not on file  Social Connections: Not on file  Intimate Partner Violence: Not At Risk (05/11/2023)   Humiliation, Afraid, Rape, and Kick questionnaire    Fear of Current or  Ex-Partner: No    Emotionally Abused: No    Physically Abused: No    Sexually Abused: No    ALLERGIES: Patient has no known allergies.  MEDICATIONS:  Current Outpatient Medications  Medication Sig Dispense Refill   albuterol  (VENTOLIN  HFA) 108 (90 Base) MCG/ACT inhaler Inhale 1-2 puffs into the lungs every 4 (four) hours as needed.     acetaminophen  (TYLENOL ) 500 MG tablet Take 500 mg by mouth every 6 (six) hours as needed for moderate pain.     aspirin  EC 81 MG tablet Take 81 mg by mouth in the morning. Swallow whole.     atorvastatin  (LIPITOR) 40 MG tablet Take 40 mg by mouth in the morning.     ELIQUIS  5 MG TABS tablet Take 5 mg by mouth 2 (two) times daily.     ipratropium (ATROVENT) 0.03 % nasal spray Place 2 sprays into both nostrils daily.     Krill Oil 350 MG CAPS Take 350 mg by mouth every evening.     levothyroxine  (SYNTHROID ) 25 MCG tablet Take 25 mcg by mouth every evening. (Patient taking differently: Take 25 mcg by mouth every evening. Taking 50 mcg)     metoprolol  tartrate (LOPRESSOR ) 25 MG tablet Take 25 mg by mouth 2 (two) times daily.     Multiple Vitamin (MULTIVITAMIN WITH MINERALS) TABS tablet Take 1 tablet by mouth in the morning. Centrum     pantoprazole  (PROTONIX ) 20 MG tablet Take 20 mg by mouth every evening.     tamsulosin  (FLOMAX ) 0.4 MG CAPS capsule Take 0.4 mg by mouth in the morning.     valACYclovir  (VALTREX ) 500 MG tablet Take 500 mg by mouth in the morning.     No current facility-administered medications for this encounter.    REVIEW OF SYSTEMS:  On review of systems, the patient reports that he is doing well overall. He denies any chest pain, shortness of breath, cough, fevers, chills, night sweats, unintended weight changes. He denies any bowel disturbances, and denies abdominal pain, nausea or vomiting. He denies any new musculoskeletal or joint aches or pains. His IPSS was 14, indicating moderate urinary symptoms despite taking Flomax  daily as  prescribed. 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 A complete review of systems is obtained and is otherwise negative.    PHYSICAL EXAM:  Wt Readings from Last  3 Encounters:  01/06/24 198 lb (89.8 kg)  10/15/23 200 lb (90.7 kg)  02/11/23 200 lb (90.7 kg)   Temp Readings from Last 3 Encounters:  01/06/24 97.7 F (36.5 C) (Oral)  10/15/23 97.9 F (36.6 C) (Temporal)  12/17/22 97.8 F (36.6 C) (Oral)   BP Readings from Last 3 Encounters:  01/06/24 124/70  10/15/23 (!) 85/56  02/11/23 100/68   Pulse Readings from Last 3 Encounters:  01/06/24 (!) 58  10/15/23 (!) 56  02/11/23 (!) 54    /10  In general this is a well appearing Caucasian male in no acute distress. He's alert and oriented x4 and appropriate throughout the examination. Cardiopulmonary assessment is negative for acute distress, and he exhibits normal effort.     KPS = 80 (afib and ILD)  100 - Normal; no complaints; no evidence of disease. 90   - Able to carry on normal activity; minor signs or symptoms of disease. 80   - Normal activity with effort; some signs or symptoms of disease. 51   - Cares for self; unable to carry on normal activity or to do active work. 60   - Requires occasional assistance, but is able to care for most of his personal needs. 50   - Requires considerable assistance and frequent medical care. 40   - Disabled; requires special care and assistance. 30   - Severely disabled; hospital admission is indicated although death not imminent. 20   - Very sick; hospital admission necessary; active supportive treatment necessary. 10   - Moribund; fatal processes progressing rapidly. 0     - Dead  Karnofsky DA, Abelmann WH, Craver LS and Burchenal JH [phone]) The use of the nitrogen mustards in the palliative treatment of carcinoma: with particular reference to bronchogenic carcinoma Cancer 1 634-56  LABORATORY DATA:  Lab Results  Component Value Date   WBC 6.9 06/19/2022    HGB 11.8 (L) 06/19/2022   HCT 37.3 (L) 06/19/2022   MCV 100.0 06/19/2022   PLT 207 06/19/2022   Lab Results  Component Value Date   NA 139 06/19/2022   K 4.0 06/19/2022   CL 103 06/19/2022   CO2 25 06/19/2022   Lab Results  Component Value Date   ALT 19 01/23/2022   AST 22 01/23/2022   ALKPHOS 85 01/23/2022   BILITOT 0.6 01/23/2022     RADIOGRAPHY: NM PET (PSMA) SKULL TO MID THIGH Result Date: 02/04/2024 EXAM: PROSTATE PET SKULL BASE TO MID THIGHS 02/02/2024 04:32:07 PM TECHNIQUE: RADIOPHARMACEUTICAL: 8.64 mCi F-18 piflufolastat (Pylarify) injected intravenously. PET imaging was obtained from skull vertex to mid thighs. Computed tomography was used for attenuation correction and localization. Fusion imaging was obtained. COMPARISON: None available. CLINICAL HISTORY: prostate cancer with psa equals 6.1. FINDINGS: PROSTATE AND PROSTATE BED: Intense activity within the posterior right lobe of the prostate gland with a SUV max of 13.8 (image 192). Intense PSMA activity within the prostate gland is consistent with primary prostate adenocarcinoma. LYMPH NODES: No radiotracer avid pelvic lymph nodes. No PSMA avid periaortic retroperitoneal nodes. No mediastinal nodal atelectasis. No evidence of prostate cancer or nodal metastasis in the abdomen or pelvis. BONES: The prostate cancer skeletal metastases. OTHER PET FINDINGS: Within the right upper lobe, there is a central perihilar mass measuring 5.1 cm. There is a radiation fiducial marker within the mass centrally located. Peripherally, there is ground glass opacities and atelectasis. Right perihilar mass has moderate PSMA activity with SUV max 7.4. Right upper lobe mass with peripheral atelectasis  which is most consistent with treatment of primary lung cancer. Physiologic activity within the salivary glands, liver, spleen, kidneys, bowel, and urinary bladder. IMPRESSION: 1. Intense PSMA activity within the posterior right lobe of the prostate gland,  consistent with primary prostate adenocarcinoma. 2. No evidence of prostate cancer or nodal metastasis in the abdomen or pelvis. 3. No new skeletal metastasis skeletal atelectasis 4. Right upper lobe mass with peripheral atelectasis at treatment site of primary lung cancer. The moderate PSMA activity is nonspecific and could represent residual or recurrent lung carcinoma versus post-radiation inflammation. An FDG PET/CT may be helpful for further evaluation. Electronically signed by: Norleen Boxer MD 02/04/2024 02:08 PM EST RP Workstation: [id number]      IMPRESSION/PLAN: 1. 83 y.o. gentleman with Stage T2b adenocarcinoma of the prostate with Gleason Score of 4+4, and PSA of 6.1. We discussed the patient's workup and outlined the nature of prostate cancer in this setting. The patient's T stage, Gleason's score, and PSA put him into the high risk group. Accordingly, he is eligible for a variety of potential treatment options including LT-ADT concurrent with either 8 weeks of external radiation or 5 weeks of external radiation with an upfront brachytherapy boost. We discussed the available radiation techniques, and focused on the details and logistics of delivery. He is not an ideal surgical candidate given his advanced age. We discussed and outlined the risks, benefits, short and long-term effects associated with radiotherapy and compared and contrasted these with prostatectomy. We discussed the role of SpaceOAR gel in reducing the rectal toxicity associated with radiotherapy. We also detailed the role of ADT in the treatment of high risk prostate cancer and outlined the associated side effects that could be expected with this therapy. He has been taking Orgovyx ADT for the past week and is tolerating this well. He was encouraged to ask questions that were answered to his stated satisfaction.  At the conclusion of our conversation, the patient is interested in moving forward with 8 weeks of external beam  therapy concurrent with ADT. He does have a dominant intraprostatic nodule that will be boosted during his course of treatment. He has already started Orgovyx ADT on 01/22/24 so we will share our discussion with Dr. Selma and make arrangements for fiducial markers and SpaceOAR gel placement in late January or early February 2026, prior to simulation, to reduce rectal toxicity from radiotherapy. The patient appears to have a good understanding of his disease and our treatment recommendations which are of curative intent and is in agreement with the stated plan.  Therefore, we will move forward with treatment planning accordingly, in anticipation of beginning IMRT in mid February 2026, once they return from a planned vacation. We enjoyed meeting him and his wife and look forward to continuing to participate in his care.  We personally spent 70 minutes in this encounter including chart review, reviewing radiological studies, meeting face-to-face with the patient, entering orders and completing documentation.    Sabra MICAEL Rusk, PA-C    Donnice Barge, MD  [facility]  Radiation Oncology Direct Dial: [phone]  Fax: [phone] [email or web address]  Skype  [email or web address]

## 2024-02-04 NOTE — Progress Notes (Signed)
 STAT read requested for recent PSMA PET for upcoming consult on 11/7.

## 2024-02-05 ENCOUNTER — Encounter: Payer: Self-pay | Admitting: Radiation Oncology

## 2024-02-05 ENCOUNTER — Ambulatory Visit
Admission: RE | Admit: 2024-02-05 | Discharge: 2024-02-05 | Disposition: A | Discharge status: Home / Self Care | Source: Ambulatory Visit | Attending: Radiation Oncology | Admitting: Radiation Oncology

## 2024-02-05 VITALS — BP 145/89 | HR 64 | Temp 97.5°F | Resp 18 | Ht 68.0 in | Wt 202.2 lb

## 2024-02-05 DIAGNOSIS — Z85828 Personal history of other malignant neoplasm of skin: Secondary | ICD-10-CM | POA: Insufficient documentation

## 2024-02-05 DIAGNOSIS — I714 Abdominal aortic aneurysm, without rupture, unspecified: Secondary | ICD-10-CM | POA: Insufficient documentation

## 2024-02-05 DIAGNOSIS — Z79899 Other long term (current) drug therapy: Secondary | ICD-10-CM | POA: Diagnosis not present

## 2024-02-05 DIAGNOSIS — Z7901 Long term (current) use of anticoagulants: Secondary | ICD-10-CM | POA: Diagnosis not present

## 2024-02-05 DIAGNOSIS — C61 Malignant neoplasm of prostate: Secondary | ICD-10-CM | POA: Insufficient documentation

## 2024-02-05 DIAGNOSIS — Z8701 Personal history of pneumonia (recurrent): Secondary | ICD-10-CM | POA: Diagnosis not present

## 2024-02-05 DIAGNOSIS — Z9481 Bone marrow transplant status: Secondary | ICD-10-CM | POA: Insufficient documentation

## 2024-02-05 DIAGNOSIS — K219 Gastro-esophageal reflux disease without esophagitis: Secondary | ICD-10-CM | POA: Insufficient documentation

## 2024-02-05 DIAGNOSIS — Z87891 Personal history of nicotine dependence: Secondary | ICD-10-CM | POA: Insufficient documentation

## 2024-02-05 DIAGNOSIS — R918 Other nonspecific abnormal finding of lung field: Secondary | ICD-10-CM | POA: Diagnosis not present

## 2024-02-05 DIAGNOSIS — Z8521 Personal history of malignant neoplasm of larynx: Secondary | ICD-10-CM | POA: Diagnosis not present

## 2024-02-05 DIAGNOSIS — Z79890 Hormone replacement therapy (postmenopausal): Secondary | ICD-10-CM | POA: Diagnosis not present

## 2024-02-05 DIAGNOSIS — I4891 Unspecified atrial fibrillation: Secondary | ICD-10-CM | POA: Diagnosis not present

## 2024-02-05 DIAGNOSIS — E785 Hyperlipidemia, unspecified: Secondary | ICD-10-CM | POA: Insufficient documentation

## 2024-02-05 DIAGNOSIS — E039 Hypothyroidism, unspecified: Secondary | ICD-10-CM | POA: Insufficient documentation

## 2024-02-05 DIAGNOSIS — Z923 Personal history of irradiation: Secondary | ICD-10-CM | POA: Insufficient documentation

## 2024-02-05 DIAGNOSIS — E041 Nontoxic single thyroid nodule: Secondary | ICD-10-CM | POA: Insufficient documentation

## 2024-02-05 DIAGNOSIS — I2510 Atherosclerotic heart disease of native coronary artery without angina pectoris: Secondary | ICD-10-CM | POA: Diagnosis not present

## 2024-02-05 DIAGNOSIS — Z79624 Long term (current) use of inhibitors of nucleotide synthesis: Secondary | ICD-10-CM | POA: Diagnosis not present

## 2024-02-05 HISTORY — DX: Malignant neoplasm of prostate: C61

## 2024-02-05 HISTORY — DX: Elevated prostate specific antigen (PSA): R97.20

## 2024-02-08 ENCOUNTER — Telehealth: Payer: Self-pay | Admitting: *Deleted

## 2024-02-08 NOTE — Progress Notes (Signed)
 Patient had recent radiation oncology consult on 11/7 for his stage T2b adenocarcinoma of the prostate with Gleason score of 4+4, and PSA of 6.1.  Patient will proceed with ADT and 8 weeks of IMRT.  RN will provide patient with written education and my contact information for any barriers or questions that may arise.

## 2024-02-08 NOTE — Telephone Encounter (Signed)
 Pt. called stating he needs to be seen for follow up from PET scan 02/02/24. The scan was ordered from Dr. Selma  Urologist but pt. wants to be seen as here. PET scan states recurrent lung carcinoma vs post radiation Inflammation. No referral seen, but pt. getting radiation treatment here. Will follow up with Dr. Sherrod, message sent.

## 2024-02-09 ENCOUNTER — Encounter: Payer: Self-pay | Admitting: Radiation Oncology

## 2024-02-09 ENCOUNTER — Ambulatory Visit: Attending: Internal Medicine

## 2024-02-09 DIAGNOSIS — R2689 Other abnormalities of gait and mobility: Secondary | ICD-10-CM | POA: Insufficient documentation

## 2024-02-09 DIAGNOSIS — Z7409 Other reduced mobility: Secondary | ICD-10-CM | POA: Diagnosis present

## 2024-02-09 DIAGNOSIS — G629 Polyneuropathy, unspecified: Secondary | ICD-10-CM | POA: Insufficient documentation

## 2024-02-09 NOTE — Therapy (Signed)
 OUTPATIENT PHYSICAL THERAPY THORACOLUMBAR Treatment   Patient Name: Branston Halsted MRN: [id number] DOB:[birth year], 83 y.o., male Today's Date: 02/09/2024  END OF SESSION:  PT End of Session - 02/09/24 1023     Visit Number 2    Date for Recertification  03/22/24    Authorization Type Medicare    PT Start Time 1017    PT Stop Time 1100    PT Time Calculation (min) 43 min    Activity Tolerance Patient tolerated treatment well    Behavior During Therapy WFL for tasks assessed/performed           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   Hyperlipidemia    atorvastatin    Hypothyroidism    Interstitial lung disease (HCC)    Laryngeal cancer (HCC) 2004   Neuromuscular disorder (HCC)    bilateral feet neuropathy   Pneumonia 07/2021   x 2   Primary cancer of bone marrow (HCC)    Prostate cancer (HCC)    Skin cancer    Head   Thyroid disease    Thyroid nodule 2024   Right lower lobe   Past Surgical History:  Procedure Laterality Date   ABDOMINAL AORTIC ANEURYSM REPAIR     BONE MARROW TRANSPLANT     BRONCHIAL BIOPSY  04/17/2022   Procedure: BRONCHIAL BIOPSIES;  Surgeon: Gladis Leonor HERO, MD;  Location: [facility] ENDOSCOPY;  Service: Pulmonary;;   BRONCHIAL BIOPSY  06/19/2022   Procedure: BRONCHIAL BIOPSIES;  Surgeon: Gladis Leonor HERO, MD;  Location: [facility] ENDOSCOPY;  Service: Pulmonary;;   BRONCHIAL BRUSHINGS  04/17/2022   Procedure: BRONCHIAL BRUSHINGS;  Surgeon: Gladis Leonor HERO, MD;  Location: [facility] ENDOSCOPY;  Service: Pulmonary;;   BRONCHIAL NEEDLE ASPIRATION BIOPSY  04/17/2022   Procedure: BRONCHIAL NEEDLE ASPIRATION BIOPSIES;  Surgeon: Gladis Leonor HERO, MD;  Location: [facility] ENDOSCOPY;  Service: Pulmonary;;    BRONCHIAL NEEDLE ASPIRATION BIOPSY  06/19/2022   Procedure: BRONCHIAL NEEDLE ASPIRATION BIOPSIES;  Surgeon: Gladis Leonor HERO, MD;  Location: [facility] ENDOSCOPY;  Service: Pulmonary;;   BRONCHIAL WASHINGS  04/17/2022   Procedure: BRONCHIAL WASHINGS;  Surgeon: Gladis Leonor HERO, MD;  Location: [facility] ENDOSCOPY;  Service: Pulmonary;;   BRONCHIAL WASHINGS  06/19/2022   Procedure: BRONCHIAL WASHINGS;  Surgeon: Gladis Leonor HERO, MD;  Location: [facility] ENDOSCOPY;  Service: Pulmonary;;   CARDIAC CATHETERIZATION     ENDARTERECTOMY Left 01/30/2022   Procedure: ENDARTERECTOMY CAROTID;  Surgeon: Lanis Fonda BRAVO, MD;  Location: [facility] OR;  Service: Vascular;  Laterality: Left;   EYE SURGERY     FIDUCIAL MARKER PLACEMENT  06/19/2022   Procedure: FIDUCIAL MARKER PLACEMENT;  Surgeon: Gladis Leonor HERO, MD;  Location: [facility] ENDOSCOPY;  Service: Pulmonary;;   HERNIA REPAIR     KNEE SURGERY Bilateral    Joint replacements   MOHS SURGERY  2024   x 2 R & L   PATCH ANGIOPLASTY Left 01/30/2022   Procedure: PATCH ANGIOPLASTY WITH XENOSURE BIOLOGIC PATCH 1CMX6CM;  Surgeon: Lanis Fonda BRAVO, MD;  Location: [facility] OR;  Service: Vascular;  Laterality: Left;   PROSTATE BIOPSY     TONSILLECTOMY  1946   VIDEO BRONCHOSCOPY WITH RADIAL ENDOBRONCHIAL ULTRASOUND  04/17/2022   Procedure: VIDEO BRONCHOSCOPY  WITH RADIAL ENDOBRONCHIAL ULTRASOUND;  Surgeon: Gladis Leonor HERO, MD;  Location: [facility] ENDOSCOPY;  Service: Pulmonary;;   VIDEO BRONCHOSCOPY WITH RADIAL ENDOBRONCHIAL ULTRASOUND  06/19/2022   Procedure: VIDEO BRONCHOSCOPY WITH RADIAL ENDOBRONCHIAL ULTRASOUND;  Surgeon: Gladis Leonor HERO, MD;  Location: [facility] ENDOSCOPY;  Service: Pulmonary;;   Patient Active Problem List   Diagnosis Date Noted   Malignant neoplasm of prostate (HCC) 02/04/2024   Malignant neoplasm of lower lobe of right lung (HCC) 07/07/2022   Pulmonary nodule 04/17/2022   AAA (abdominal aortic aneurysm) 01/30/2022   Carotid artery stenosis 01/30/2022   Carotid stenosis,  asymptomatic 01/30/2022   Rhinovirus infection 08/12/2021   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   Acute respiratory failure with hypoxia (HCC) 08/12/2021   PNA (pneumonia) 08/11/2021   Sepsis due to pneumonia (HCC) 08/11/2021   Macrocytic anemia 08/11/2021   Hypothyroidism 08/11/2021   HTN (hypertension) 08/11/2021   ILD (interstitial lung disease) (HCC) 08/11/2021   A-fib (HCC) 08/11/2021    PCP: Charlie Love  REFERRING PROVIDER: Charlie Love  REFERRING DIAG:  M54.9 (ICD-10-CM) - Dorsalgia, unspecified  R26.81 (ICD-10-CM) - Unsteadiness on feet  M79.89 (ICD-10-CM) - Other specified soft tissue disorders    Rationale for Evaluation and Treatment: Rehabilitation  THERAPY DIAG:  Neuropathy  Balance disorder  Impaired functional mobility, balance, gait, and endurance  ONSET DATE: about 3 years ago  SUBJECTIVE:                                                                                                                                                                                           SUBJECTIVE STATEMENT: Pt reported no pain today and no recent falls. Pt says he feels about the same with everything since last visit.   PERTINENT HISTORY:  Peripheral neuropathy and balance issues Bilateral knee surgery   PAIN:  Are you having pain? Yes: NPRS scale: 5/10 Pain location: bilateral thighs Pain description: dull ache, some tingling Aggravating factors: end of day is worse Relieving factors: nothing really  PRECAUTIONS: None  RED FLAGS: None   WEIGHT BEARING RESTRICTIONS: No  FALLS:  Has patient fallen in last 6 months? No  LIVING ENVIRONMENT: Lives with: lives with their spouse Lives in: House/apartment Stairs: Yes: Internal: 15 steps; on right going up and External: 2 steps; can reach both Has following equipment at home: Single point cane and Walker - 2 wheeled  OCCUPATION: Retired  PLOF: Independent and Independent with  gait  PATIENT GOALS: I liked to make it better   NEXT MD VISIT:   OBJECTIVE:  Note: Objective measures were completed at Evaluation unless otherwise noted.  DIAGNOSTIC FINDINGS:  COGNITION: Overall cognitive status: Within functional limits for tasks assessed     SENSATION: Light touch: Impaired   MUSCLE LENGTH: Hamstrings: tightness in BLE  POSTURE: rounded shoulders   LUMBAR ROM:   AROM eval  Flexion 75% tightness in HS  Extension 75% a little pain  Right lateral flexion 50% some tightness  Left lateral flexion 50% some tightness  Right rotation WFL  Left rotation WFL   (Blank rows = not tested)  LOWER EXTREMITY ROM:     Active  Right eval Left eval  Hip flexion WNL WNL  Hip extension    Hip abduction    Hip adduction    Hip internal rotation    Hip external rotation    Knee flexion 100 105  Knee extension [facility]  Ankle dorsiflexion [facility] WFL  Ankle plantarflexion [facility] WFL  Ankle inversion    Ankle eversion     (Blank rows = not tested)  LOWER EXTREMITY MMT:  5/5 BLE  FUNCTIONAL TESTS:  5 times sit to stand: 11.42s from chair Timed up and go (TUG): 10s BERG 46/56  TREATMENT DATE:   02/09/24 NuStep L5x35mins Retest of balance assessments  Step Ups 6 2x10 Cone taps on airex pad alternating 2x20 Lateral walking on foam beam STS on airex pad Hip abduction against therapist resistance 2x10   01/26/24 EVAL   PATIENT EDUCATION:  Education details: POC and HEP Person educated: Patient Education method: Explanation Education comprehension: verbalized understanding and returned demonstration  HOME EXERCISE PROGRAM: Access Code: [email or web address] URL: [email or web address] Date: 01/26/2024 Prepared by: Almetta Fam  Exercises - Standing Tandem Balance with Counter Support  - 1 x daily - 7 x weekly - 5 reps - 30 hold - Standing Single Leg Stance with Counter Support  - 1 x daily - 7 x weekly - 5 reps - 10 hold - Seated Hamstring  Stretch  - 1 x daily - 7 x weekly - 2 reps - 15 hold  ASSESSMENT:  CLINICAL IMPRESSION:  02/09/24: Pt still experiencing neuropathy on BLEs. Pt exhibits deficits in balance and dynamic activities that require prolonged endurance and increased SL stance time. Pt requires prolonged rest period with activity that increases HR due to increase in RR and decreased lung capacity. Pt will benefit from continued PT to progress endurance, balance, and quality of gait.    Patient is a 83 y.o. male who was seen today for physical therapy evaluation and treatment for bilateral neuropathy. He reports going to the gym 3x a week. He is also golfing 1-2x a week. He has been experiencing neuropathic pain for about 3 years now. With balance testing, he does have some areas where we can help improve his balance and stability. Patient does have some limited lumbar ROM, mostly due to tightness in low back and hamstrings. He has had bilateral knee replacement and is missing flexion ROM on both side. He will benefit from skilled PT to work on his balance and be able to do his regular exercises without increased neuropathy.   OBJECTIVE IMPAIRMENTS: decreased balance, decreased ROM, impaired flexibility, and pain.   ACTIVITY LIMITATIONS: bending and locomotion level  PARTICIPATION LIMITATIONS: community activity, yard work, and gym activities  PERSONAL FACTORS: Time since onset of injury/illness/exacerbation and 1-2 comorbidities: prostate cancer, lung disease, NM disorder are also affecting patient's functional outcome.   REHAB POTENTIAL: Good  CLINICAL DECISION MAKING: Stable/uncomplicated  EVALUATION COMPLEXITY: Low  GOALS: Goals reviewed with patient? Yes  SHORT TERM GOALS: Target date: 02/23/24  Patient will be independent with initial HEP. Baseline:  Goal status: PROGRESSING more than half of HEP 02/09/24   LONG TERM GOALS: Target date: 03/22/24  Patient will be independent with advanced/ongoing HEP  to improve outcomes and carryover.  Baseline:  Goal status: PROGRESSING more than half of HEP 02/09/24  Patient will demonstrate full lumbar ROM Baseline: see chart Goal status: Unchanged; 02/09/24  3.  Patient will demonstrate tandem stance on firm and foam >30s.  Baseline:  Goal status: IN PROGRESS; 15s LLE in front; 10s RLE in front   4.  Patient will demonstrate SLS on firm and foam >10s Baseline:  Goal status: IN PROGRESS; 4s on RLE 8s on LLE on firm 02/09/24  5.  Patient will score 52 or better on Berg Balance test to demonstrate lower risk of falls. Baseline: 46/56 Goal status: INITIAL  PLAN:  PT FREQUENCY: 1x/week  PT DURATION: 8 weeks  PLANNED INTERVENTIONS: 97110-Therapeutic exercises, 97530- Therapeutic activity, [id number]- Neuromuscular re-education, 97535- Self Care, 02859- Manual therapy, [id number]- Gait training, [phone] (1-2 muscles), 20561 (3+ muscles)- Dry Needling, Patient/Family education, Balance training, Stair training, Joint mobilization, Cryotherapy, and Moist heat.  PLAN FOR NEXT SESSION: balance training on foam surfaces, tandem and SLS, head turns, stretching for tight hip and hamstrings    Thersia Alder, Student-PT 02/09/2024, 11:05 AM

## 2024-02-10 ENCOUNTER — Telehealth: Payer: Self-pay

## 2024-02-10 NOTE — Telephone Encounter (Signed)
 Tried to reach patient in regards to appt to follow up on PET scan results from Dr. Selma.  Asked for a return call to schedule appt the week of 02/22/24.

## 2024-02-10 NOTE — Telephone Encounter (Signed)
 Spoke with patient regarding appointment. Informed patient that Dr. Selma placed a referral to Radiation Oncology with Dr. Patrcia. Patient was seen by Dr. Patrcia on 02/05/24. Explained that there may have been some confusion regarding which department to contact for scheduling. Advised patient that if a future appointment is needed with Dr. Sherrod, our office will be glad to schedule. Patient verbalized understanding.

## 2024-02-15 ENCOUNTER — Ambulatory Visit: Admitting: Physical Therapy

## 2024-02-17 ENCOUNTER — Telehealth: Payer: Self-pay | Admitting: Internal Medicine

## 2024-02-17 NOTE — Telephone Encounter (Signed)
 Scheduled patient for a scan review on 11/25 with Dr Sherrod. Called and spoke with the patient, he is aware.

## 2024-02-18 ENCOUNTER — Other Ambulatory Visit (HOSPITAL_BASED_OUTPATIENT_CLINIC_OR_DEPARTMENT_OTHER)

## 2024-02-18 ENCOUNTER — Telehealth: Payer: Self-pay | Admitting: *Deleted

## 2024-02-18 NOTE — Telephone Encounter (Signed)
 CALLED PATIENT TO INFORM THAT CT  AND TELELPHONE FU HAS BEEN MOVED TO MARCH 2026 PER ALISON PERKINS REQUEST, LVM FOR A RETURN CALL

## 2024-02-22 ENCOUNTER — Ambulatory Visit (HOSPITAL_BASED_OUTPATIENT_CLINIC_OR_DEPARTMENT_OTHER)

## 2024-02-23 ENCOUNTER — Encounter: Payer: Self-pay | Admitting: Physical Therapy

## 2024-02-23 ENCOUNTER — Ambulatory Visit: Admitting: Physical Therapy

## 2024-02-23 ENCOUNTER — Inpatient Hospital Stay: Admitting: Internal Medicine

## 2024-02-23 DIAGNOSIS — G629 Polyneuropathy, unspecified: Secondary | ICD-10-CM

## 2024-02-23 DIAGNOSIS — Z7409 Other reduced mobility: Secondary | ICD-10-CM

## 2024-02-23 DIAGNOSIS — R2689 Other abnormalities of gait and mobility: Secondary | ICD-10-CM

## 2024-02-23 NOTE — Therapy (Signed)
 OUTPATIENT PHYSICAL THERAPY THORACOLUMBAR Treatment   Patient Name: Peter Greene MRN: [id number] DOB:1940/10/27, 83 y.o., male Today's Date: 02/23/2024  END OF SESSION:  PT End of Session - 02/23/24 0759     Visit Number 3    Date for Recertification  03/22/24    PT Start Time 0800    PT Stop Time 0845    PT Time Calculation (min) 45 min    Activity Tolerance Patient tolerated treatment well    Behavior During Therapy [facility] for tasks assessed/performed           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   Hyperlipidemia    atorvastatin    Hypothyroidism    Interstitial lung disease (HCC)    Laryngeal cancer (HCC) 2004   Neuromuscular disorder (HCC)    bilateral feet neuropathy   Pneumonia 07/2021   x 2   Primary cancer of bone marrow (HCC)    Prostate cancer (HCC)    Skin cancer    Head   Thyroid disease    Thyroid nodule 2024   Right lower lobe   Past Surgical History:  Procedure Laterality Date   ABDOMINAL AORTIC ANEURYSM REPAIR     BONE MARROW TRANSPLANT     BRONCHIAL BIOPSY  04/17/2022   Procedure: BRONCHIAL BIOPSIES;  Surgeon: Gladis Leonor HERO, MD;  Location: [facility] ENDOSCOPY;  Service: Pulmonary;;   BRONCHIAL BIOPSY  06/19/2022   Procedure: BRONCHIAL BIOPSIES;  Surgeon: Gladis Leonor HERO, MD;  Location: [facility] ENDOSCOPY;  Service: Pulmonary;;   BRONCHIAL BRUSHINGS  04/17/2022   Procedure: BRONCHIAL BRUSHINGS;  Surgeon: Gladis Leonor HERO, MD;  Location: [facility] ENDOSCOPY;  Service: Pulmonary;;   BRONCHIAL NEEDLE ASPIRATION BIOPSY  04/17/2022   Procedure: BRONCHIAL NEEDLE ASPIRATION BIOPSIES;  Surgeon: Gladis Leonor HERO, MD;  Location: [facility] ENDOSCOPY;  Service: Pulmonary;;   BRONCHIAL NEEDLE ASPIRATION  BIOPSY  06/19/2022   Procedure: BRONCHIAL NEEDLE ASPIRATION BIOPSIES;  Surgeon: Gladis Leonor HERO, MD;  Location: [facility] ENDOSCOPY;  Service: Pulmonary;;   BRONCHIAL WASHINGS  04/17/2022   Procedure: BRONCHIAL WASHINGS;  Surgeon: Gladis Leonor HERO, MD;  Location: [facility] ENDOSCOPY;  Service: Pulmonary;;   BRONCHIAL WASHINGS  06/19/2022   Procedure: BRONCHIAL WASHINGS;  Surgeon: Gladis Leonor HERO, MD;  Location: [facility] ENDOSCOPY;  Service: Pulmonary;;   CARDIAC CATHETERIZATION     ENDARTERECTOMY Left 01/30/2022   Procedure: ENDARTERECTOMY CAROTID;  Surgeon: Lanis Fonda BRAVO, MD;  Location: [facility] OR;  Service: Vascular;  Laterality: Left;   EYE SURGERY     FIDUCIAL MARKER PLACEMENT  06/19/2022   Procedure: FIDUCIAL MARKER PLACEMENT;  Surgeon: Gladis Leonor HERO, MD;  Location: [facility] ENDOSCOPY;  Service: Pulmonary;;   HERNIA REPAIR     KNEE SURGERY Bilateral    Joint replacements   MOHS SURGERY  2024   x 2 R & L   PATCH ANGIOPLASTY Left 01/30/2022   Procedure: PATCH ANGIOPLASTY WITH XENOSURE BIOLOGIC PATCH 1CMX6CM;  Surgeon: Lanis Fonda BRAVO, MD;  Location: [facility] OR;  Service: Vascular;  Laterality: Left;   PROSTATE BIOPSY     TONSILLECTOMY  1946   VIDEO BRONCHOSCOPY WITH RADIAL ENDOBRONCHIAL ULTRASOUND  04/17/2022   Procedure: VIDEO BRONCHOSCOPY WITH RADIAL ENDOBRONCHIAL ULTRASOUND;  Surgeon:  Gladis Leonor HERO, MD;  Location: [facility] ENDOSCOPY;  Service: Pulmonary;;   VIDEO BRONCHOSCOPY WITH RADIAL ENDOBRONCHIAL ULTRASOUND  06/19/2022   Procedure: VIDEO BRONCHOSCOPY WITH RADIAL ENDOBRONCHIAL ULTRASOUND;  Surgeon: Gladis Leonor HERO, MD;  Location: [facility] ENDOSCOPY;  Service: Pulmonary;;   Patient Active Problem List   Diagnosis Date Noted   Malignant neoplasm of prostate (HCC) 02/04/2024   Malignant neoplasm of lower lobe of right lung (HCC) 07/07/2022   Pulmonary nodule 04/17/2022   AAA (abdominal aortic aneurysm) 01/30/2022   Carotid artery stenosis 01/30/2022   Carotid stenosis, asymptomatic 01/30/2022    Rhinovirus infection 08/12/2021   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   Acute respiratory failure with hypoxia (HCC) 08/12/2021   PNA (pneumonia) 08/11/2021   Sepsis due to pneumonia (HCC) 08/11/2021   Macrocytic anemia 08/11/2021   Hypothyroidism 08/11/2021   HTN (hypertension) 08/11/2021   ILD (interstitial lung disease) (HCC) 08/11/2021   A-fib (HCC) 08/11/2021    PCP: Charlie Love  REFERRING PROVIDER: Charlie Love  REFERRING DIAG:  M54.9 (ICD-10-CM) - Dorsalgia, unspecified  R26.81 (ICD-10-CM) - Unsteadiness on feet  M79.89 (ICD-10-CM) - Other specified soft tissue disorders    Rationale for Evaluation and Treatment: Rehabilitation  THERAPY DIAG:  Neuropathy  Balance disorder  Impaired functional mobility, balance, gait, and endurance  ONSET DATE: about 3 years ago  SUBJECTIVE:                                                                                                                                                                                           SUBJECTIVE STATEMENT: Doing ok, no recent falls  PERTINENT HISTORY:  Peripheral neuropathy and balance issues Bilateral knee surgery   PAIN:  Are you having pain? Yes: NPRS scale: 0/10 Pain location: bilateral thighs Pain description: dull ache, some tingling Aggravating factors: end of day is worse Relieving factors: nothing really  PRECAUTIONS: None  RED FLAGS: None   WEIGHT BEARING RESTRICTIONS: No  FALLS:  Has patient fallen in last 6 months? No  LIVING ENVIRONMENT: Lives with: lives with their spouse Lives in: House/apartment Stairs: Yes: Internal: 15 steps; on right going up and External: 2 steps; can reach both Has following equipment at home: Single point cane and Walker - 2 wheeled  OCCUPATION: Retired  PLOF: Independent and Independent with gait  PATIENT GOALS: I liked to make it better   NEXT MD VISIT:   OBJECTIVE:  Note: Objective measures were completed  at Evaluation unless otherwise noted.  DIAGNOSTIC FINDINGS:    COGNITION: Overall cognitive status: Within functional limits for tasks assessed     SENSATION: Light touch: Impaired   MUSCLE LENGTH:  Hamstrings: tightness in BLE  POSTURE: rounded shoulders   LUMBAR ROM:   AROM eval 02/23/24  Flexion 75% tightness in HS WFL   Extension 75% a little pain Limited 25%  Right lateral flexion 50% some tightness WFL  Left lateral flexion 50% some tightness WFL  Right rotation [facility] WFL  Left rotation WFL WFL   (Blank rows = not tested)  LOWER EXTREMITY ROM:     Active  Right eval Left eval  Hip flexion WNL WNL  Hip extension    Hip abduction    Hip adduction    Hip internal rotation    Hip external rotation    Knee flexion 100 105  Knee extension [facility]  Ankle dorsiflexion [facility] WFL  Ankle plantarflexion [facility] WFL  Ankle inversion    Ankle eversion     (Blank rows = not tested)  LOWER EXTREMITY MMT:  5/5 BLE  FUNCTIONAL TESTS:  5 times sit to stand: 11.42s from chair Timed up and go (TUG): 10s BERG 46/56  TREATMENT DATE:  02/23/24 NuStep L 5 x 6 min ROM Alt 6in box tap from airex 2x10 S2S LE on airex 2x10  2nd set holding yellow ball  Side steps on and off airex 30lb resisted gait 4 way x 4 each 4in box on airex lateal step ups  Heel raises 2x15  02/09/24 NuStep L5x22mins Retest of balance assessments  Step Ups 6 2x10 Cone taps on airex pad alternating 2x20 Lateral walking on foam beam STS on airex pad Hip abduction against therapist resistance 2x10   01/26/24 EVAL   PATIENT EDUCATION:  Education details: POC and HEP Person educated: Patient Education method: Explanation Education comprehension: verbalized understanding and returned demonstration  HOME EXERCISE PROGRAM: Access Code: [email or web address] URL: [email or web address] Date: 01/26/2024 Prepared by: Almetta Fam  Exercises - Standing Tandem Balance with Counter Support  - 1 x  daily - 7 x weekly - 5 reps - 30 hold - Standing Single Leg Stance with Counter Support  - 1 x daily - 7 x weekly - 5 reps - 10 hold - Seated Hamstring Stretch  - 1 x daily - 7 x weekly - 2 reps - 15 hold  ASSESSMENT:  CLINICAL IMPRESSION:  02/23/24: Pt still experiencing neuropathy on BLEs. Pt continues to exhibits deficits in balance and dynamic activities that require prolonged endurance. Pt requires prolonged rest period with activity that increases HR due to increase in RR and decreased lung capacity. CGA required with lateral step ups. He has progressed increasing his lumbar AROM. Pt will benefit from continued PT to progress endurance, balance, and quality of gait.    Patient is a 83 y.o. male who was seen today for physical therapy evaluation and treatment for bilateral neuropathy. He reports going to the gym 3x a week. He is also golfing 1-2x a week. He has been experiencing neuropathic pain for about 3 years now. With balance testing, he does have some areas where we can help improve his balance and stability. Patient does have some limited lumbar ROM, mostly due to tightness in low back and hamstrings. He has had bilateral knee replacement and is missing flexion ROM on both side. He will benefit from skilled PT to work on his balance and be able to do his regular exercises without increased neuropathy.   OBJECTIVE IMPAIRMENTS: decreased balance, decreased ROM, impaired flexibility, and pain.   ACTIVITY LIMITATIONS: bending and locomotion level  PARTICIPATION LIMITATIONS: community activity, yard work, and gym activities  PERSONAL FACTORS:  Time since onset of injury/illness/exacerbation and 1-2 comorbidities: prostate cancer, lung disease, NM disorder are also affecting patient's functional outcome.   REHAB POTENTIAL: Good  CLINICAL DECISION MAKING: Stable/uncomplicated  EVALUATION COMPLEXITY: Low  GOALS: Goals reviewed with patient? Yes  SHORT TERM GOALS: Target date:  02/23/24  Patient will be independent with initial HEP. Baseline:  Goal status: PROGRESSING more than half of HEP 02/09/24, Met 02/23/24   LONG TERM GOALS: Target date: 03/22/24  Patient will be independent with advanced/ongoing HEP to improve outcomes and carryover.  Baseline:  Goal status: PROGRESSING more than half of HEP 02/09/24  Patient will demonstrate full lumbar ROM Baseline: see chart Goal status: Unchanged; 02/09/24  3.  Patient will demonstrate tandem stance on firm and foam >30s.  Baseline:  Goal status: IN PROGRESS; 15s LLE in front; 10s RLE in front   4.  Patient will demonstrate SLS on firm and foam >10s Baseline:  Goal status: IN PROGRESS; 4s on RLE 8s on LLE on firm 02/09/24  5.  Patient will score 52 or better on Berg Balance test to demonstrate lower risk of falls. Baseline: 46/56 Goal status: INITIAL  PLAN:  PT FREQUENCY: 1x/week  PT DURATION: 8 weeks  PLANNED INTERVENTIONS: 97110-Therapeutic exercises, 97530- Therapeutic activity, [id number]- Neuromuscular re-education, 97535- Self Care, 02859- Manual therapy, [id number]- Gait training, [phone] (1-2 muscles), 20561 (3+ muscles)- Dry Needling, Patient/Family education, Balance training, Stair training, Joint mobilization, Cryotherapy, and Moist heat.  PLAN FOR NEXT SESSION: balance training on foam surfaces, tandem and SLS, head turns, stretching for tight hip and hamstrings    Tanda KANDICE Sorrow, PTA 02/23/2024, 7:59 AM

## 2024-02-29 ENCOUNTER — Ambulatory Visit: Admitting: Radiation Oncology

## 2024-03-28 ENCOUNTER — Other Ambulatory Visit: Payer: Self-pay

## 2024-03-28 ENCOUNTER — Inpatient Hospital Stay (HOSPITAL_COMMUNITY)
Admission: EM | Admit: 2024-03-28 | Discharge: 2024-04-05 | DRG: 193 | Disposition: A | Discharge status: Home / Self Care | Attending: Internal Medicine | Admitting: Internal Medicine

## 2024-03-28 ENCOUNTER — Inpatient Hospital Stay (HOSPITAL_COMMUNITY)

## 2024-03-28 ENCOUNTER — Emergency Department (HOSPITAL_COMMUNITY)

## 2024-03-28 ENCOUNTER — Ambulatory Visit: Admitting: Physical Therapy

## 2024-03-28 ENCOUNTER — Encounter (HOSPITAL_COMMUNITY): Payer: Self-pay

## 2024-03-28 DIAGNOSIS — I5033 Acute on chronic diastolic (congestive) heart failure: Secondary | ICD-10-CM | POA: Diagnosis present

## 2024-03-28 DIAGNOSIS — J849 Interstitial pulmonary disease, unspecified: Secondary | ICD-10-CM | POA: Diagnosis not present

## 2024-03-28 DIAGNOSIS — N289 Disorder of kidney and ureter, unspecified: Secondary | ICD-10-CM

## 2024-03-28 DIAGNOSIS — Y830 Surgical operation with transplant of whole organ as the cause of abnormal reaction of the patient, or of later complication, without mention of misadventure at the time of the procedure: Secondary | ICD-10-CM | POA: Diagnosis present

## 2024-03-28 DIAGNOSIS — E039 Hypothyroidism, unspecified: Secondary | ICD-10-CM | POA: Diagnosis present

## 2024-03-28 DIAGNOSIS — I959 Hypotension, unspecified: Secondary | ICD-10-CM | POA: Diagnosis present

## 2024-03-28 DIAGNOSIS — J9601 Acute respiratory failure with hypoxia: Principal | ICD-10-CM | POA: Diagnosis present

## 2024-03-28 DIAGNOSIS — I482 Chronic atrial fibrillation, unspecified: Secondary | ICD-10-CM | POA: Diagnosis present

## 2024-03-28 DIAGNOSIS — J13 Pneumonia due to Streptococcus pneumoniae: Secondary | ICD-10-CM

## 2024-03-28 DIAGNOSIS — D89813 Graft-versus-host disease, unspecified: Secondary | ICD-10-CM | POA: Diagnosis present

## 2024-03-28 DIAGNOSIS — Z1152 Encounter for screening for COVID-19: Secondary | ICD-10-CM | POA: Diagnosis not present

## 2024-03-28 DIAGNOSIS — Z8546 Personal history of malignant neoplasm of prostate: Secondary | ICD-10-CM

## 2024-03-28 DIAGNOSIS — Z85118 Personal history of other malignant neoplasm of bronchus and lung: Secondary | ICD-10-CM | POA: Diagnosis not present

## 2024-03-28 DIAGNOSIS — D469 Myelodysplastic syndrome, unspecified: Secondary | ICD-10-CM | POA: Diagnosis present

## 2024-03-28 DIAGNOSIS — K219 Gastro-esophageal reflux disease without esophagitis: Secondary | ICD-10-CM | POA: Diagnosis present

## 2024-03-28 DIAGNOSIS — E860 Dehydration: Secondary | ICD-10-CM | POA: Diagnosis present

## 2024-03-28 DIAGNOSIS — Z7982 Long term (current) use of aspirin: Secondary | ICD-10-CM

## 2024-03-28 DIAGNOSIS — Z79899 Other long term (current) drug therapy: Secondary | ICD-10-CM

## 2024-03-28 DIAGNOSIS — Z7901 Long term (current) use of anticoagulants: Secondary | ICD-10-CM

## 2024-03-28 DIAGNOSIS — N1831 Chronic kidney disease, stage 3a: Secondary | ICD-10-CM | POA: Diagnosis present

## 2024-03-28 DIAGNOSIS — J122 Parainfluenza virus pneumonia: Principal | ICD-10-CM | POA: Diagnosis present

## 2024-03-28 DIAGNOSIS — Z79890 Hormone replacement therapy (postmenopausal): Secondary | ICD-10-CM

## 2024-03-28 DIAGNOSIS — I4891 Unspecified atrial fibrillation: Secondary | ICD-10-CM | POA: Diagnosis not present

## 2024-03-28 DIAGNOSIS — C61 Malignant neoplasm of prostate: Secondary | ICD-10-CM | POA: Diagnosis not present

## 2024-03-28 DIAGNOSIS — T8609 Other complications of bone marrow transplant: Secondary | ICD-10-CM | POA: Diagnosis present

## 2024-03-28 DIAGNOSIS — Z87891 Personal history of nicotine dependence: Secondary | ICD-10-CM

## 2024-03-28 DIAGNOSIS — J8410 Pulmonary fibrosis, unspecified: Secondary | ICD-10-CM | POA: Diagnosis present

## 2024-03-28 DIAGNOSIS — Z8521 Personal history of malignant neoplasm of larynx: Secondary | ICD-10-CM

## 2024-03-28 DIAGNOSIS — Z923 Personal history of irradiation: Secondary | ICD-10-CM | POA: Diagnosis not present

## 2024-03-28 DIAGNOSIS — E785 Hyperlipidemia, unspecified: Secondary | ICD-10-CM | POA: Diagnosis present

## 2024-03-28 DIAGNOSIS — N179 Acute kidney failure, unspecified: Secondary | ICD-10-CM | POA: Diagnosis present

## 2024-03-28 DIAGNOSIS — A419 Sepsis, unspecified organism: Secondary | ICD-10-CM

## 2024-03-28 DIAGNOSIS — C3490 Malignant neoplasm of unspecified part of unspecified bronchus or lung: Secondary | ICD-10-CM | POA: Diagnosis present

## 2024-03-28 DIAGNOSIS — J189 Pneumonia, unspecified organism: Secondary | ICD-10-CM | POA: Diagnosis present

## 2024-03-28 DIAGNOSIS — I5030 Unspecified diastolic (congestive) heart failure: Secondary | ICD-10-CM | POA: Diagnosis not present

## 2024-03-28 DIAGNOSIS — E871 Hypo-osmolality and hyponatremia: Secondary | ICD-10-CM | POA: Diagnosis present

## 2024-03-28 DIAGNOSIS — Z85828 Personal history of other malignant neoplasm of skin: Secondary | ICD-10-CM | POA: Diagnosis not present

## 2024-03-28 DIAGNOSIS — I5031 Acute diastolic (congestive) heart failure: Secondary | ICD-10-CM | POA: Diagnosis not present

## 2024-03-28 LAB — RESP PANEL BY RT-PCR (RSV, FLU A&B, COVID)  RVPGX2
Influenza A by PCR: NEGATIVE
Influenza B by PCR: NEGATIVE
Resp Syncytial Virus by PCR: NEGATIVE
SARS Coronavirus 2 by RT PCR: NEGATIVE

## 2024-03-28 LAB — RESPIRATORY PANEL BY PCR

## 2024-03-28 LAB — CBC WITH DIFFERENTIAL/PLATELET
Abs Immature Granulocytes: 0.05 K/uL (ref 0.00–0.07)
Basophils Absolute: 0 K/uL (ref 0.0–0.1)
Basophils Relative: 0 %
Eosinophils Absolute: 0.2 K/uL (ref 0.0–0.5)
Eosinophils Relative: 3 %
HCT: 33.6 % — ABNORMAL LOW (ref 39.0–52.0)
Hemoglobin: 11.2 g/dL — ABNORMAL LOW (ref 13.0–17.0)
Immature Granulocytes: 1 %
Lymphocytes Relative: 24 %
Lymphs Abs: 1.8 K/uL (ref 0.7–4.0)
MCH: 30.6 pg (ref 26.0–34.0)
MCHC: 33.3 g/dL (ref 30.0–36.0)
MCV: 91.8 fL (ref 80.0–100.0)
Monocytes Absolute: 0.7 K/uL (ref 0.1–1.0)
Monocytes Relative: 9 %
Neutro Abs: 4.9 K/uL (ref 1.7–7.7)
Neutrophils Relative %: 63 %
Platelets: 239 K/uL (ref 150–400)
RBC: 3.66 MIL/uL — ABNORMAL LOW (ref 4.22–5.81)
RDW: 15.5 % (ref 11.5–15.5)
WBC: 7.6 K/uL (ref 4.0–10.5)
nRBC: 0 % (ref 0.0–0.2)

## 2024-03-28 LAB — COMPREHENSIVE METABOLIC PANEL WITH GFR
ALT: 21 U/L (ref 0–44)
AST: 39 U/L (ref 15–41)
Albumin: 3.9 g/dL (ref 3.5–5.0)
Alkaline Phosphatase: 71 U/L (ref 38–126)
Anion gap: 13 (ref 5–15)
BUN: 32 mg/dL — ABNORMAL HIGH (ref 8–23)
CO2: 21 mmol/L — ABNORMAL LOW (ref 22–32)
Calcium: 8.8 mg/dL — ABNORMAL LOW (ref 8.9–10.3)
Chloride: 94 mmol/L — ABNORMAL LOW (ref 98–111)
Creatinine, Ser: 1.51 mg/dL — ABNORMAL HIGH (ref 0.61–1.24)
GFR, Estimated: 46 mL/min — ABNORMAL LOW
Glucose, Bld: 116 mg/dL — ABNORMAL HIGH (ref 70–99)
Potassium: 4.2 mmol/L (ref 3.5–5.1)
Sodium: 127 mmol/L — ABNORMAL LOW (ref 135–145)
Total Bilirubin: 0.6 mg/dL (ref 0.0–1.2)
Total Protein: 7.9 g/dL (ref 6.5–8.1)

## 2024-03-28 LAB — PRO BRAIN NATRIURETIC PEPTIDE: Pro Brain Natriuretic Peptide: 737 pg/mL — ABNORMAL HIGH

## 2024-03-28 LAB — PROTIME-INR
INR: 1.6 — ABNORMAL HIGH (ref 0.8–1.2)
Prothrombin Time: 20 s — ABNORMAL HIGH (ref 11.4–15.2)

## 2024-03-28 LAB — PROCALCITONIN: Procalcitonin: 0.41 ng/mL

## 2024-03-28 LAB — I-STAT CG4 LACTIC ACID, ED: Lactic Acid, Venous: 1.6 mmol/L (ref 0.5–1.9)

## 2024-03-28 MED ORDER — SODIUM CHLORIDE 0.9 % IV SOLN: 1.0000 g | Freq: Once | INTRAVENOUS | Status: DC

## 2024-03-28 MED ORDER — ATORVASTATIN CALCIUM 40 MG PO TABS
40.0000 mg | ORAL_TABLET | Freq: Every morning | ORAL | Status: DC
  Administered 2024-03-29 – 2024-04-05 (×8): 40 mg via ORAL
  Filled 2024-03-28 (×9): qty 1

## 2024-03-28 MED ORDER — ASPIRIN 81 MG PO TBEC
81.0000 mg | DELAYED_RELEASE_TABLET | Freq: Every morning | ORAL | Status: DC
  Administered 2024-03-29 – 2024-04-05 (×8): 81 mg via ORAL
  Filled 2024-03-28 (×9): qty 1

## 2024-03-28 MED ORDER — VALACYCLOVIR HCL 500 MG PO TABS
500.0000 mg | ORAL_TABLET | Freq: Every morning | ORAL | Status: DC
  Administered 2024-03-29 – 2024-04-05 (×8): 500 mg via ORAL
  Filled 2024-03-28 (×8): qty 1

## 2024-03-28 MED ORDER — PANTOPRAZOLE SODIUM 20 MG PO TBEC
20.0000 mg | DELAYED_RELEASE_TABLET | Freq: Every evening | ORAL | Status: DC
  Administered 2024-03-28 – 2024-04-04 (×8): 20 mg via ORAL
  Filled 2024-03-28 (×8): qty 1

## 2024-03-28 MED ORDER — SODIUM CHLORIDE 0.9 % IV SOLN
2.0000 g | INTRAVENOUS | Status: DC
  Administered 2024-03-29: 2 g via INTRAVENOUS
  Filled 2024-03-28: qty 20

## 2024-03-28 MED ORDER — RELUGOLIX 120 MG PO TABS
120.0000 mg | ORAL_TABLET | Freq: Every day | ORAL | Status: DC
  Administered 2024-03-30 – 2024-04-05 (×7): 120 mg via ORAL
  Filled 2024-03-28 (×9): qty 1

## 2024-03-28 MED ORDER — SODIUM CHLORIDE 0.9% FLUSH
3.0000 mL | Freq: Two times a day (BID) | INTRAVENOUS | Status: DC
  Administered 2024-03-28 – 2024-04-05 (×16): 3 mL via INTRAVENOUS

## 2024-03-28 MED ORDER — APIXABAN 5 MG PO TABS
5.0000 mg | ORAL_TABLET | Freq: Two times a day (BID) | ORAL | Status: DC
  Administered 2024-03-28 – 2024-04-05 (×16): 5 mg via ORAL
  Filled 2024-03-28 (×16): qty 1

## 2024-03-28 MED ORDER — SODIUM CHLORIDE 0.9 % IV SOLN
500.0000 mg | INTRAVENOUS | Status: DC
  Administered 2024-03-29 – 2024-04-02 (×5): 500 mg via INTRAVENOUS
  Filled 2024-03-28 (×5): qty 5

## 2024-03-28 MED ORDER — SODIUM CHLORIDE 0.9 % IV SOLN: INTRAVENOUS | Status: DC

## 2024-03-28 MED ORDER — SODIUM CHLORIDE 0.9 % IV SOLN: 2.0000 g | Freq: Once | INTRAVENOUS | Status: DC

## 2024-03-28 MED ORDER — SODIUM CHLORIDE 0.9 % IV SOLN
1.0000 g | Freq: Once | INTRAVENOUS | Status: AC
  Administered 2024-03-28: 1 g via INTRAVENOUS
  Filled 2024-03-28: qty 10

## 2024-03-28 MED ORDER — GUAIFENESIN ER 600 MG PO TB12
600.0000 mg | ORAL_TABLET | Freq: Two times a day (BID) | ORAL | Status: DC
  Administered 2024-03-28 – 2024-04-05 (×17): 600 mg via ORAL
  Filled 2024-03-28 (×17): qty 1

## 2024-03-28 MED ORDER — ACETAMINOPHEN 650 MG RE SUPP: 650.0000 mg | Freq: Four times a day (QID) | RECTAL | Status: DC | PRN

## 2024-03-28 MED ORDER — IOHEXOL 350 MG/ML SOLN
75.0000 mL | Freq: Once | INTRAVENOUS | Status: AC | PRN
  Administered 2024-03-28: 65 mL via INTRAVENOUS

## 2024-03-28 MED ORDER — LACTATED RINGERS IV BOLUS (SEPSIS)
1000.0000 mL | Freq: Once | INTRAVENOUS | Status: AC
  Administered 2024-03-28: 1000 mL via INTRAVENOUS

## 2024-03-28 MED ORDER — ACETAMINOPHEN 325 MG PO TABS
650.0000 mg | ORAL_TABLET | Freq: Four times a day (QID) | ORAL | Status: DC | PRN
  Administered 2024-03-30 – 2024-04-02 (×5): 650 mg via ORAL
  Filled 2024-03-28 (×5): qty 2

## 2024-03-28 MED ORDER — TAMSULOSIN HCL 0.4 MG PO CAPS
0.4000 mg | ORAL_CAPSULE | Freq: Every morning | ORAL | Status: DC
  Administered 2024-03-29 – 2024-04-05 (×8): 0.4 mg via ORAL
  Filled 2024-03-28 (×8): qty 1

## 2024-03-28 MED ORDER — ALBUTEROL SULFATE (2.5 MG/3ML) 0.083% IN NEBU: 2.5000 mg | INHALATION_SOLUTION | RESPIRATORY_TRACT | Status: AC | PRN

## 2024-03-28 MED ORDER — SODIUM CHLORIDE 0.9 % IV BOLUS
1000.0000 mL | Freq: Once | INTRAVENOUS | Status: AC
  Administered 2024-03-28: 1000 mL via INTRAVENOUS

## 2024-03-28 MED ORDER — AZITHROMYCIN 500 MG IV SOLR
500.0000 mg | Freq: Once | INTRAVENOUS | Status: AC
  Administered 2024-03-28: 500 mg via INTRAVENOUS
  Filled 2024-03-28: qty 5

## 2024-03-28 NOTE — ED Provider Notes (Signed)
 " [facility] Provider Note   CSN: [id number] Arrival date & time: 03/28/24  1313     Patient presents with: Pneumonia (/) and Shortness of Breath   Peter Greene is a 83 y.o. male.   HPI 84 year old male presents with hypoxia from PCP office.  He has been feeling ill for about 2-3 days with cough, congestion, shortness of breath.  He has a history of interstitial lung disease but does not wear oxygen.  Has a history of recurrent pneumonia and has Levaquin at home to use if he starts having symptoms.  He has been on the Levaquin for these past 3 days without relief.  Was doing worse so he went to his PCPs office.  There they found him to be hypoxic to 86% on room air and gave him a gram of IM Rocephin  as well as a dose of IM Solu-Medrol.  They sent him here.  Patient feels generally weak but no focal weakness.  No chest pain.  He had a low-grade temps of around to 100.  Prior to Admission medications  Medication Sig Start Date End Date Taking? Authorizing Provider  aspirin  EC 81 MG tablet Take 81 mg by mouth in the morning. Swallow whole.    [provider]  atorvastatin  (LIPITOR) 40 MG tablet Take 40 mg by mouth in the morning. 06/28/21   [provider]  cycloSPORINE (RESTASIS) 0.05 % ophthalmic emulsion Apply 1 drop to eye. 08/14/23   [provider]  ELIQUIS  5 MG TABS tablet Take 5 mg by mouth 2 (two) times daily. 05/28/21   [provider]  gabapentin  (NEURONTIN ) 100 MG capsule Take 100 mg by mouth 2 (two) times daily. 02/17/22   [provider]  ipratropium (ATROVENT) 0.03 % nasal spray Place 2 sprays into both nostrils daily. 03/21/22   [provider]  Anselm Oil 350 MG CAPS Take 350 mg by mouth every evening.    [provider]  Melatonin 10 MG CAPS Take 10 mg by mouth as needed.    [provider]  metoprolol  tartrate (LOPRESSOR ) 25 MG tablet Take 25 mg by mouth 2  (two) times daily. 07/14/21   [provider]  mometasone (ELOCON) 0.1 % cream SMARTSIG:sparingly Topical Twice Daily 12/10/23   [provider]  Multiple Vitamin (MULTIVITAMIN WITH MINERALS) TABS tablet Take 1 tablet by mouth in the morning. Centrum    [provider]  pantoprazole  (PROTONIX ) 20 MG tablet Take 20 mg by mouth every evening. 06/22/21   [provider]  Perfluorohexyloctane (MIEBO) 1.338 GM/ML SOLN INSTILL 1 DROP INTO BOTH EYES AS DIRECTED 06/11/22   [provider]  Propylene Glycol (SYSTANE BALANCE) 0.6 % SOLN as directed Ophthalmic Three times a day    [provider]  sildenafil (REVATIO) 20 MG tablet SMARTSIG:3-5 Tablet(s) By Mouth PRN 01/08/24   [provider]  tamsulosin  (FLOMAX ) 0.4 MG CAPS capsule Take 0.4 mg by mouth in the morning. 04/13/20   [provider]  valACYclovir  (VALTREX ) 500 MG tablet Take 500 mg by mouth in the morning. 09/09/20   [provider]    Allergies: Patient has no known allergies.    Review of Systems  Constitutional:  Positive for fever.  HENT:  Positive for congestion.   Respiratory:  Positive for cough and shortness of breath.   Cardiovascular:  Positive for chest pain (when coughing). Negative for leg swelling.    Updated Vital Signs BP 104/67  Pulse 70   Temp (!) 97.5 F (36.4 C) (Oral)   Resp 12   Wt 92.1 kg   SpO2 97%   BMI 30.87 kg/m   Physical Exam Vitals and nursing note reviewed.  Constitutional:      General: He is not in acute distress.    Appearance: He is well-developed. He is not ill-appearing or diaphoretic.  HENT:     Head: Normocephalic and atraumatic.  Cardiovascular:     Rate and Rhythm: Normal rate and regular rhythm.     Heart sounds: Normal heart sounds.  Pulmonary:     Effort: Pulmonary effort is normal. No accessory muscle usage.     Breath sounds: Normal breath sounds. No wheezing.  Abdominal:     Palpations: Abdomen is  soft.     Tenderness: There is no abdominal tenderness.  Musculoskeletal:     Right lower leg: No edema.     Left lower leg: No edema.  Skin:    General: Skin is warm and dry.  Neurological:     Mental Status: He is alert.     (all labs ordered are listed, but only abnormal results are displayed) Labs Reviewed  COMPREHENSIVE METABOLIC PANEL WITH GFR - Abnormal; Notable for the following components:      Result Value   Sodium 127 (*)    Chloride 94 (*)    CO2 21 (*)    Glucose, Bld 116 (*)    BUN 32 (*)    Creatinine, Ser 1.51 (*)    Calcium  8.8 (*)    GFR, Estimated 46 (*)    All other components within normal limits  CBC WITH DIFFERENTIAL/PLATELET - Abnormal; Notable for the following components:   RBC 3.66 (*)    Hemoglobin 11.2 (*)    HCT 33.6 (*)    All other components within normal limits  PROTIME-INR - Abnormal; Notable for the following components:   Prothrombin Time 20.0 (*)    INR 1.6 (*)    All other components within normal limits  RESP PANEL BY RT-PCR (RSV, FLU A&B, COVID)  RVPGX2  CULTURE, BLOOD (ROUTINE X 2)  CULTURE, BLOOD (ROUTINE X 2) W REFLEX TO ID PANEL  I-STAT CG4 LACTIC ACID, ED  I-STAT CG4 LACTIC ACID, ED    EKG: EKG Interpretation Date/Time:  Monday March 28 2024 13:38:13 EST Ventricular Rate:  91 PR Interval:  196 QRS Duration:  92 QT Interval:  387 QTC Calculation: 477 R Axis:   96  Text Interpretation: Sinus rhythm Right axis deviation Borderline prolonged QT interval Confirmed by Freddi Hamilton [phone]) on 03/28/2024 2:00:34 PM  Radiology: [facility] Chest Port 1 View Result Date: 03/28/2024 EXAM: 1 VIEW(S) XRAY OF THE CHEST 03/28/2024 01:49:27 PM COMPARISON: 06/19/2022 CLINICAL HISTORY: shortness of breath FINDINGS: LUNGS AND PLEURA: Right hemithorax volume loss. Diffuse interstitial prominence which may reflect mild edema versus atypical infection. Asymmetric elevation of right hemidiaphragm. Right upper lung interstitial opacities,  likely posttreatment changes. No pleural effusion. No pneumothorax. HEART AND MEDIASTINUM: No acute abnormality of the cardiac and mediastinal silhouettes. BONES AND SOFT TISSUES: No acute osseous abnormality. IMPRESSION: 1. Right hemithorax volume loss and asymmetric elevation of right hemidiaphragm. Likely reflecting post-treatment changes. 2. Diffuse interstitial prominence, possibly representing mild edema or atypical infection. Electronically signed by: Waddell Calk MD 03/28/2024 02:49 PM EST RP Workstation: [id number]     Procedures   Medications Ordered in the ED  azithromycin  (ZITHROMAX ) 500 mg in sodium chloride  0.9 % 250 mL IVPB (500  mg Intravenous New Bag/Given 03/28/24 1452)  lactated ringers  bolus 1,000 mL (0 mLs Intravenous Stopped 03/28/24 1420)  sodium chloride  0.9 % bolus 1,000 mL (0 mLs Intravenous Stopped 03/28/24 1447)  cefTRIAXone  (ROCEPHIN ) 1 g in sodium chloride  0.9 % 100 mL IVPB (0 g Intravenous Stopped 03/28/24 1420)                                    Medical Decision Making Amount and/or Complexity of Data Reviewed Labs: ordered.    Details: Normal lactate, normal WBC Radiology: ordered and independent interpretation performed. ECG/medicine tests: ordered and independent interpretation performed.    Details: No ischemia  Risk Decision regarding hospitalization.   Patient presents with hypoxia and hypotension.  He has had respiratory symptoms concerning for pneumonia.  He has responded to IV fluids and has many maps above 65.  He is not otherwise ill-appearing.  He was given broad IV antibiotics.  He had been given 1 g IM Rocephin  so he was given 1 g further IV in addition to the azithromycin .  I think his x-ray represents acute infection as the likely source of his hypoxia and hypotension.  I think PE is less likely.  Discussed with Dr. Claudene, who will admit.     Final diagnoses:  Acute respiratory failure with hypoxia (HCC)  Sepsis, due to unspecified  organism, unspecified whether acute organ dysfunction present [facility])    ED Discharge Orders     None          Freddi Hamilton, MD 03/28/24 1515  "

## 2024-03-28 NOTE — ED Triage Notes (Signed)
 Patient sent over by PCP with a note that states he has pneumonia and respiratory failure and hypoxia. Patient looks pale on arrival to ED, hypotensive. He states he is a little shob.

## 2024-03-28 NOTE — H&P (Addendum)
 " History and Physical    Patient: Peter Greene FMW:[id number] DOB: April 11, 1940 DOA: 03/28/2024 DOS: the patient was seen and examined on 03/28/2024 PCP: [name], MD  Patient coming from: Home  Chief Complaint:  Chief Complaint  Patient presents with   Pneumonia        Shortness of Breath   HPI: Peter Greene is a 83 y.o. 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  He is accompanied by his wife, Peter Greene.  He has been experiencing shortness of breath, lightheadedness, and a cough for the past two days, along with a low-grade fever reaching 100.32F, which lasted for about a day. These symptoms are similar to previous episodes of pneumonia he has experienced.  He has a history of pulmonary nodules, which were suspected to be cancerous, although biopsies did not confirm this. He underwent radiation treatment to the chest for these nodules.  He started taking levofloxacin for three days ago for his symptoms, but it did not alleviate them as it usually does.  At his primary care provider office today he was seen and noted to be hypoxic with O2 saturations at 86% on room air.  Typically patient is not on oxygen at baseline.  Patient had been given a shot of Rocephin  as well as Solu-Medrol while in the office.  No nausea, vomiting, diarrhea, or stomach pain. He reports a little wheezing and difficulty maintaining adequate fluid intake, which is a daily issue.  His wife makes note that he had been somewhat confused or in a fall and that she thought was secondary to him being dehydrated and seems to have improved since getting to the hospital and receiving initial therapies.  He is currently on Eliquis  and Orgovyx for prostate cancer.  In the emergency department patient was noted  to have temperature 97.5 F with pulse 64-82, respirations 12-20, blood pressure 68/38 with improvement after IV fluids to 104/67, and O2 saturations as low as 84% with improvement on 2 L of nasal cannula oxygen.  Labs note WBC 7.6, hemoglobin 11.2, sodium 127, chloride 94, CO2 21, BUN 32, creatinine 1.51, and calcium  8.8.  Chest x-ray noted right hemithorax volume loss with asymmetric elevation of the right hemidiaphragm reflecting post treatment changes, and diffuse interstitial prominence concerning for mild edema or atypical infection.  Influenza, COVID-19, and RSV screening were negative.  Patient had been bolused 2 L of IV fluids and given empiric antibiotics of Rocephin  and azithromycin .  Review of Systems: As mentioned in the history of present illness. All other systems reviewed and are negative. 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   Hyperlipidemia    atorvastatin    Hypothyroidism    Interstitial lung disease (HCC)    Laryngeal cancer (HCC) 2004   Neuromuscular disorder (HCC)    bilateral feet neuropathy   Pneumonia 07/2021   x 2   Primary cancer of bone marrow (HCC)    Prostate cancer (HCC)    Skin cancer    Head   Thyroid disease  Thyroid nodule 2024   Right lower lobe   Past Surgical History:  Procedure Laterality Date   ABDOMINAL AORTIC ANEURYSM REPAIR     BONE MARROW TRANSPLANT     BRONCHIAL BIOPSY  04/17/2022   Procedure: BRONCHIAL BIOPSIES;  Surgeon: Gladis Leonor HERO, MD;  Location: [facility] ENDOSCOPY;  Service: Pulmonary;;   BRONCHIAL BIOPSY  06/19/2022   Procedure: BRONCHIAL BIOPSIES;  Surgeon: Gladis Leonor HERO, MD;  Location: [facility] ENDOSCOPY;  Service: Pulmonary;;   BRONCHIAL BRUSHINGS  04/17/2022    Procedure: BRONCHIAL BRUSHINGS;  Surgeon: Gladis Leonor HERO, MD;  Location: [facility] ENDOSCOPY;  Service: Pulmonary;;   BRONCHIAL NEEDLE ASPIRATION BIOPSY  04/17/2022   Procedure: BRONCHIAL NEEDLE ASPIRATION BIOPSIES;  Surgeon: Gladis Leonor HERO, MD;  Location: [facility] ENDOSCOPY;  Service: Pulmonary;;   BRONCHIAL NEEDLE ASPIRATION BIOPSY  06/19/2022   Procedure: BRONCHIAL NEEDLE ASPIRATION BIOPSIES;  Surgeon: Gladis Leonor HERO, MD;  Location: [facility] ENDOSCOPY;  Service: Pulmonary;;   BRONCHIAL WASHINGS  04/17/2022   Procedure: BRONCHIAL WASHINGS;  Surgeon: Gladis Leonor HERO, MD;  Location: [facility] ENDOSCOPY;  Service: Pulmonary;;   BRONCHIAL WASHINGS  06/19/2022   Procedure: BRONCHIAL WASHINGS;  Surgeon: Gladis Leonor HERO, MD;  Location: [facility] ENDOSCOPY;  Service: Pulmonary;;   CARDIAC CATHETERIZATION     ENDARTERECTOMY Left 01/30/2022   Procedure: ENDARTERECTOMY CAROTID;  Surgeon: Lanis Fonda BRAVO, MD;  Location: [facility] OR;  Service: Vascular;  Laterality: Left;   EYE SURGERY     FIDUCIAL MARKER PLACEMENT  06/19/2022   Procedure: FIDUCIAL MARKER PLACEMENT;  Surgeon: Gladis Leonor HERO, MD;  Location[facility] ENDOSCOPY;  Service: Pulmonary;;   HERNIA REPAIR     KNEE SURGERY Bilateral    Joint replacements   MOHS SURGERY  2024   x 2 R & L   PATCH ANGIOPLASTY Left 01/30/2022   Procedure: PATCH ANGIOPLASTY WITH GEORGE BIOLOGIC PATCH [id number];  Surgeon: Lanis Fonda BRAVO, MD;  Location: [facility] OR;  Service: Vascular;  Laterality: Left;   PROSTATE BIOPSY     TONSILLECTOMY  1946   VIDEO BRONCHOSCOPY WITH RADIAL ENDOBRONCHIAL ULTRASOUND  04/17/2022   Procedure: VIDEO BRONCHOSCOPY WITH RADIAL ENDOBRONCHIAL ULTRASOUND;  Surgeon: Gladis Leonor HERO, MD;  Location: [facility] ENDOSCOPY;  Service: Pulmonary;;   VIDEO BRONCHOSCOPY WITH RADIAL ENDOBRONCHIAL ULTRASOUND  06/19/2022   Procedure: VIDEO BRONCHOSCOPY WITH RADIAL ENDOBRONCHIAL ULTRASOUND;  Surgeon: Gladis Leonor HERO, MD;  Location: [facility] ENDOSCOPY;  Service: Pulmonary;;   Social  History:  reports that he quit smoking about 42 years ago. His smoking use included cigarettes. He has never been exposed to tobacco smoke. He has never used smokeless tobacco. He reports current alcohol  use of about 2.0 standard drinks of alcohol  per week. He reports that he does not use drugs.  Allergies[1]  History reviewed. No pertinent family history.  Prior to Admission medications  Medication Sig Start Date End Date Taking? Authorizing Provider  aspirin  EC 81 MG tablet Take 81 mg by mouth in the morning. Swallow whole.    [provider]  atorvastatin  (LIPITOR) 40 MG tablet Take 40 mg by mouth in the morning. 06/28/21   [provider]  cycloSPORINE (RESTASIS) 0.05 % ophthalmic emulsion Apply 1 drop to eye. 08/14/23   [provider]  ELIQUIS  5 MG TABS tablet Take 5 mg by mouth 2 (two) times daily. 05/28/21   [provider]  gabapentin  (NEURONTIN ) 100 MG capsule Take 100 mg by mouth 2 (two) times daily. 02/17/22   [provider]  ipratropium (ATROVENT) 0.03 % nasal spray Place 2  sprays into both nostrils daily. 03/21/22   [provider]  Anselm Oil 350 MG CAPS Take 350 mg by mouth every evening.    [provider]  Melatonin 10 MG CAPS Take 10 mg by mouth as needed.    [provider]  metoprolol  tartrate (LOPRESSOR ) 25 MG tablet Take 25 mg by mouth 2 (two) times daily. 07/14/21   [provider]  mometasone (ELOCON) 0.1 % cream SMARTSIG:sparingly Topical Twice Daily 12/10/23   [provider]  Multiple Vitamin (MULTIVITAMIN WITH MINERALS) TABS tablet Take 1 tablet by mouth in the morning. Centrum    [provider]  pantoprazole  (PROTONIX ) 20 MG tablet Take 20 mg by mouth every evening. 06/22/21   [provider]  Perfluorohexyloctane (MIEBO) 1.338 GM/ML SOLN INSTILL 1 DROP INTO BOTH EYES AS DIRECTED 06/11/22   [provider]  Propylene Glycol (SYSTANE BALANCE) 0.6 % SOLN as  directed Ophthalmic Three times a day    [provider]  sildenafil (REVATIO) 20 MG tablet SMARTSIG:3-5 Tablet(s) By Mouth PRN 01/08/24   [provider]  tamsulosin  (FLOMAX ) 0.4 MG CAPS capsule Take 0.4 mg by mouth in the morning. 04/13/20   [provider]  valACYclovir  (VALTREX ) 500 MG tablet Take 500 mg by mouth in the morning. 09/09/20   [provider]    Physical Exam: Vitals:   03/28/24 1430 03/28/24 1435 03/28/24 1440 03/28/24 1445  BP: 96/62 95/66 100/72 104/67  Pulse: 61 (!) 58 (!) 59 70  Resp: 14 13 13 12   Temp:      TempSrc:      SpO2: 96% 96% 97% 97%  Weight:         Constitutional: Elderly male who appears acutely ill but in no acute distress Eyes: PERRL, lids and conjunctivae normal ENMT: Mucous membranes are moist.  Fair dentition Neck: normal, supple  Respiratory: Decreased aeration with  crackles appreciated.  No significant wheezes appreciated at this time O2 saturations currently maintained on 2 L of nasal cannula oxygen.  Patient able to speak in fairly complete sentences. Cardiovascular: irregular irregular. No lower extremity edema. 2+ pedal pulses.   Abdomen: no tenderness, no masses palpated.   Bowel sounds positive.  Musculoskeletal: no clubbing / cyanosis. No joint deformity upper and lower extremities. Good ROM, no contractures. Normal muscle tone.  Skin: no rashes, lesions, ulcers. No induration Neurologic: CN 2-12 grossly intact. Sensation intact, DTR normal. Strength 5/5 in all 4.  Psychiatric: Normal judgment and insight. Alert and oriented x 3. Normal mood.   Data Reviewed:  EKG revealed fibrillation at 91 bpm.  Reviewed labs, imaging, and pertinent records as documented Assessment and Plan:  Acute respiratory failure with hypoxia secondary to pneumonia Patient presents with complaints of low-grade fever up to 100.3 F at home with cough and shortness of breath.  Treated with levofloxacin at home without  improvement.  Noted to have O2 saturations as low as 84% with improvement on 2 L nasal cannula oxygen.  Chest x-ray noted postradiation changes with diffuse interstitial prominence thought to represent edema versus atypical infection.  Influenza, COVID-19, and RSV screening were negative.  Patient had been started on empiric antibiotics of Rocephin  and azithromycin . - Admit to progressive bed   - Continuous pulse oximetry with oxygen to maintain O2 saturations greater than 92% - Incentive spirometry and flutter valve - Follow-up complete respiratory virus panel - Check procalcitonin - Check CT angiogram of the chest - Continue Rocephin  and azithromycin  - Mucinex   Transient hypotension Initial blood pressure noted to be as low as 68/38.   Blood pressures improved with 2 L of IV fluids. - Goal maps greater than 65 - Hold metoprolol  - Normal saline IV fluids  Hyponatremia Acute.  Sodium noted to be 127.  Suspect to be a hypovolemic hyponatremia based off of reports of decreased p.o. intake.   - Continue IV fluids - Continue to monitor sodium levels  Interstitial lung disease Patient with a history of interstitial lung disease with pulmonary fibrosis as well as postradiation fibrosis followed by Dr. [name].  Patient had been given steroids at his primary care office prior to arrival. - Continue breathing treatments as needed - Follow-up CT of the chest  Atrial fibrillation Chronic.  Patient appears to be in atrial fibrillation currently rate controlled. - Continue Eliquis   - Held Lopressor  due to hypotension.  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  Baseline creatinine previously noted to be around 1.3.  Patient had been bolused 2 L normal saline IV fluids.  Wife noted patient with decreased p.o. intake. -  Continue IV fluids at 75 mL/h - Continue to monitor kidney function    Hyperlipidemia - Continue atorvastatin   GERD - Continue Protonix    Lung malignancy Patient with a prior history of positive PET scans, but negative biopsies for which he underwent SBRT.  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 - Continue outpatient follow-up     Prostate cancer - Continue relugolix.  Patient's wife to bring home supply  DVT prophylaxis: Eliquis  Advance Care Planning:   Code Status: Full Code    Consults: None  Family Communication: Patient's wife updated at bedside  Severity of Illness: The appropriate patient status for this patient is INPATIENT. Inpatient status is judged to be reasonable and necessary in order to provide the required intensity of service to ensure the patient's safety. The patient's presenting symptoms, physical exam findings, and initial radiographic and laboratory data in the context of their chronic comorbidities is felt to place them at high risk for further clinical deterioration. Furthermore, it is not anticipated that the patient will be medically stable for discharge from the hospital within 2 midnights of admission.   * I certify that at the point of admission it is my clinical judgment that the patient will require inpatient hospital care spanning beyond 2 midnights from the point of admission due to high intensity of service, high risk for further deterioration and high frequency of surveillance required.*  Author: Maximino DELENA Sharps, MD 03/28/2024 3:06 PM  For on call review [email or web address].      [1] No Known Allergies  "

## 2024-03-28 NOTE — Sepsis Progress Note (Signed)
 Sepsis protocol monitored by eLink

## 2024-03-29 ENCOUNTER — Inpatient Hospital Stay (HOSPITAL_COMMUNITY)

## 2024-03-29 DIAGNOSIS — I5031 Acute diastolic (congestive) heart failure: Secondary | ICD-10-CM | POA: Diagnosis not present

## 2024-03-29 DIAGNOSIS — J9601 Acute respiratory failure with hypoxia: Secondary | ICD-10-CM | POA: Diagnosis not present

## 2024-03-29 LAB — CBC
HCT: 37.6 % — ABNORMAL LOW (ref 39.0–52.0)
Hemoglobin: 12.3 g/dL — ABNORMAL LOW (ref 13.0–17.0)
MCH: 30.3 pg (ref 26.0–34.0)
MCHC: 32.7 g/dL (ref 30.0–36.0)
MCV: 92.6 fL (ref 80.0–100.0)
Platelets: 227 K/uL (ref 150–400)
RBC: 4.06 MIL/uL — ABNORMAL LOW (ref 4.22–5.81)
RDW: 15.7 % — ABNORMAL HIGH (ref 11.5–15.5)
WBC: 5.1 K/uL (ref 4.0–10.5)
nRBC: 0 % (ref 0.0–0.2)

## 2024-03-29 LAB — BASIC METABOLIC PANEL WITH GFR
Anion gap: 11 (ref 5–15)
BUN: 27 mg/dL — ABNORMAL HIGH (ref 8–23)
CO2: 22 mmol/L (ref 22–32)
Calcium: 9.1 mg/dL (ref 8.9–10.3)
Chloride: 105 mmol/L (ref 98–111)
Creatinine, Ser: 1.21 mg/dL (ref 0.61–1.24)
GFR, Estimated: >60 mL/min
Glucose, Bld: 146 mg/dL — ABNORMAL HIGH (ref 70–99)
Potassium: 4.5 mmol/L (ref 3.5–5.1)
Sodium: 139 mmol/L (ref 135–145)

## 2024-03-29 LAB — ECHOCARDIOGRAM COMPLETE
AR max vel: 1.7 cm2
AV Area VTI: 1.6 cm2
AV Area mean vel: 1.51 cm2
AV Mean grad: 3 mmHg
AV Peak grad: 5.5 mmHg
Ao pk vel: 1.17 m/s
Area-P 1/2: 3.4 cm2
Height: 68 in
S' Lateral: 3.5 cm
Weight: 3245.17 [oz_av]

## 2024-03-29 LAB — C-REACTIVE PROTEIN: CRP: 10.1 mg/dL — ABNORMAL HIGH

## 2024-03-29 LAB — PRO BRAIN NATRIURETIC PEPTIDE: Pro Brain Natriuretic Peptide: 498 pg/mL — ABNORMAL HIGH

## 2024-03-29 LAB — PROCALCITONIN: Procalcitonin: 0.39 ng/mL

## 2024-03-29 MED ORDER — METOPROLOL TARTRATE 25 MG PO TABS
25.0000 mg | ORAL_TABLET | Freq: Two times a day (BID) | ORAL | Status: DC
  Administered 2024-03-29 – 2024-04-05 (×15): 25 mg via ORAL
  Filled 2024-03-29 (×15): qty 1

## 2024-03-29 MED ORDER — FUROSEMIDE 10 MG/ML IJ SOLN
20.0000 mg | Freq: Once | INTRAMUSCULAR | Status: AC
  Administered 2024-03-29: 20 mg via INTRAVENOUS
  Filled 2024-03-29: qty 2

## 2024-03-29 MED ORDER — OMEGA-3-ACID ETHYL ESTERS 1 G PO CAPS
1.0000 g | ORAL_CAPSULE | Freq: Two times a day (BID) | ORAL | Status: DC
  Administered 2024-03-29 – 2024-04-05 (×14): 1 g via ORAL
  Filled 2024-03-29 (×14): qty 1

## 2024-03-29 MED ORDER — LEVOTHYROXINE SODIUM 50 MCG PO TABS
50.0000 ug | ORAL_TABLET | Freq: Every day | ORAL | Status: DC
  Administered 2024-03-29 – 2024-04-05 (×8): 50 ug via ORAL
  Filled 2024-03-29 (×8): qty 1

## 2024-03-29 MED ORDER — FLUTICASONE PROPIONATE 50 MCG/ACT NA SUSP
1.0000 | Freq: Every day | NASAL | Status: DC
  Administered 2024-03-29 – 2024-04-05 (×7): 1 via NASAL
  Filled 2024-03-29 (×2): qty 16

## 2024-03-29 MED ORDER — FUROSEMIDE 10 MG/ML IJ SOLN: 40.0000 mg | Freq: Once | INTRAMUSCULAR | Status: DC

## 2024-03-29 MED ORDER — PERFLUTREN LIPID MICROSPHERE
1.0000 mL | INTRAVENOUS | Status: AC | PRN
  Administered 2024-03-29: 2 mL via INTRAVENOUS

## 2024-03-29 MED ORDER — OMEGA 3 1000 MG PO CAPS: 1.0000 | ORAL_CAPSULE | Freq: Two times a day (BID) | ORAL | Status: DC

## 2024-03-29 NOTE — Plan of Care (Signed)

## 2024-03-29 NOTE — Progress Notes (Signed)
 "                                                                                                                                                                                                                                                                                PROGRESS NOTE     Patient Demographics:    Peter Greene, is a 83 y.o. male, DOB - Mar 03, 1941, FMW:[id number]  Outpatient Primary MD for the patient is [name], MD    LOS - 1  Admit date - 03/28/2024    Chief Complaint  Patient presents with   Pneumonia        Shortness of Breath       Brief Narrative (HPI from H&P)   83 y.o. 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  He is accompanied by his wife, [name].   He has been experiencing shortness of breath, lightheadedness, and a cough for the past two days, along with a low-grade fever reaching 100.25F, which lasted for about a day. These symptoms are similar to previous episodes of pneumonia he has experienced.   He has a history of pulmonary nodules, which were suspected to be cancerous, although biopsies did not confirm this. He underwent radiation treatment to the chest for these nodules.   He started taking levofloxacin for three days ago for his symptoms, but it did not alleviate them as it usually does.  At his primary care provider office today he was seen and noted to be hypoxic with O2 saturations at 86% on room air.  Typically patient is not on oxygen at baseline.  Patient had been given a shot of Rocephin  as well as Solu-Medrol while in the office.   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 Patient with underlying  history of ILD, does not use oxygen, lung cancer, chronically follows with [place] pulmonary Dr. Forrestine, took outpatient Levaquin course without any benefit, admitted to the hospital yesterday for possible community-acquired pneumonia, started on empiric antibiotics with good results, will also give a trial of Lasix  as he has developed some crackles overnight, encouraged to sit in chair use I-S and flutter valve.  Advance activity and titrate down oxygen.  If there is any decline we will consider IV steroids considering his underlying ILD history.  Continue to monitor.  Follow cultures and obtain echocardiogram.    Hyponatremia Acute.  Sodium noted to be 127.  Due to dehydration.  Resolved.   Interstitial lung disease  Patient with a history of interstitial lung disease with pulmonary fibrosis as well as postradiation fibrosis followed by Dr. [name].  Patient had been given steroids at his primary care office prior to arrival.  Continue supportive care and monitor.   Paroxysmal/chronic atrial fibrillation .  Patient appears to be in atrial fibrillation currently rate controlled.  Continue Eliquis , low-dose Lopressor .  Advised to score greater than 4.   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   Hyperlipidemia  - Continue atorvastatin    GERD  - Continue Protonix     Lung malignancy  Patient with a prior history of positive PET scans, but negative biopsies for which he underwent SBRT.  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  Continue outpatient follow-up with Dr [name]     Prostate cancer  - Continue relugolix.  Patient's wife to bring home supply       Condition - Guarded  Family Communication  : Wife [name] [phone] on 03/29/2024 at 9:31 AM  Code Status :  Full  Consults  :  None  PUD Prophylaxis : PPI   Procedures  :     TTE -  CT - 1. No pulmonary embolism. 2. Interval increase of an approximately 6 cm right perihilar peribronchovascular consolidation/mass extending into the right upper and right lower lobes, with associated central 4 mm metallic density. 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 3. Persistent bilateral patchy reticulations with superimposed ground-glass opacities. 4. Persistent trace right pleural effusion. 5. Aortic valve leaflet calcification and at least 3 vessel coronary artery calcifications.      Disposition Plan  :    Status is: Inpatient  DVT Prophylaxis  :    apixaban  (ELIQUIS ) tablet 5 mg   Lab Results  Component Value Date   PLT 227 03/29/2024    Diet :  Diet Order             Diet Heart Room service appropriate? Yes; Fluid consistency: Thin  Diet effective now                    Inpatient Medications  Scheduled Meds:  apixaban   5 mg Oral BID   aspirin  EC  81 mg Oral q AM   atorvastatin   40 mg Oral q AM   fluticasone   1 spray Each Nare Daily   furosemide   40 mg Intravenous Once   guaiFENesin   600 mg Oral BID   levothyroxine   50 mcg Oral Q0600   pantoprazole   20 mg Oral QPM   relugolix  120 mg Oral Daily   sodium chloride  flush  3 mL Intravenous  Q12H   tamsulosin   0.4 mg Oral q AM   valACYclovir   500 mg Oral q AM   Continuous Infusions:  azithromycin      cefTRIAXone  (ROCEPHIN )  IV     PRN Meds:.acetaminophen  **OR** acetaminophen , albuterol     Objective:   Vitals:   03/29/24 0400 03/29/24 0500 03/29/24 0600 03/29/24 0741  BP: (!) 145/101  113/61   Pulse: (!) 54 86 (!) 53   Resp: 12 13 12    Temp: 97.7 F (36.5 C)  97.9 F (36.6 C) 98 F (36.7 C)  TempSrc: Oral   Oral  SpO2: 97% 95% 96% 96%  Weight:      Height:        Wt Readings from Last  3 Encounters:  03/28/24 92 kg  02/05/24 91.7 kg  01/06/24 89.8 kg     Intake/Output Summary (Last 24 hours) at 03/29/2024 0927 Last data filed at 03/28/2024 2100 Gross per 24 hour  Intake 250 ml  Output 900 ml  Net -650 ml     Physical Exam  Awake Alert, No new F.N deficits, Normal affect .AT,PERRAL Supple Neck, No JVD,   Symmetrical Chest wall movement, Good air movement bilaterally, +ve rales RRR,No Gallops,Rubs or new Murmurs,  +ve B.Sounds, Abd Soft, No tenderness,   No Cyanosis, Clubbing or edema        Data Review:    Recent Labs  Lab 03/28/24 1354 03/29/24 0414  WBC 7.6 5.1  HGB 11.2* 12.3*  HCT 33.6* 37.6*  PLT 239 227  MCV 91.8 92.6  MCH 30.6 30.3  MCHC 33.3 32.7  RDW 15.5 15.7*  LYMPHSABS 1.8  --   MONOABS 0.7  --   EOSABS 0.2  --   BASOSABS 0.0  --     Recent Labs  Lab 03/28/24 1347 03/28/24 1354 03/28/24 1759 03/29/24 0414 03/29/24 0618  NA  --  127*  --  139  --   K  --  4.2  --  4.5  --   CL  --  94*  --  105  --   CO2  --  21*  --  22  --   ANIONGAP  --  13  --  11  --   GLUCOSE  --  116*  --  146*  --   BUN  --  32*  --  27*  --   CREATININE  --  1.51*  --  1.21  --   AST  --  39  --   --   --   ALT  --  21  --   --   --   ALKPHOS  --  71  --   --   --   BILITOT  --  0.6  --   --   --   ALBUMIN  --  3.9  --   --   --   CRP  --   --   --   --  10.1*  PROCALCITON  --   --  0.41  --  0.39  LATICACIDVEN 1.6  --   --   --   --   INR  --  1.6*  --   --   --   CALCIUM   --  8.8*  --  9.1  --       Recent Labs  Lab 03/28/24 1347 03/28/24 1354 03/28/24 1759 03/29/24 0414 03/29/24 0618  CRP  --   --   --   --  10.1*  PROCALCITON  --   --  0.41  --  0.39  LATICACIDVEN 1.6  --   --   --   --   INR  --  1.6*  --   --   --   CALCIUM   --  8.8*  --  9.1  --     ProBNP (last 3 results) Recent Labs    03/28/24 1759 03/29/24 0618  PROBNP 737.0* 498.0*     --------------------------------------------------------------------------------------------------------------- Lab Results  Component Value Date   CHOL 112 01/31/2022   HDL 30 (L) 01/31/2022   LDLCALC 71 01/31/2022   TRIG 54 01/31/2022   CHOLHDL 3.7 01/31/2022    No results found for: HGBA1C No results for input(s): TSH, T4TOTAL, FREET4, T3FREE, THYROIDAB in the last 72 hours. No results for input(s): VITAMINB12, FOLATE, FERRITIN, TIBC, IRON, RETICCTPCT in the last 72 hours. ------------------------------------------------------------------------------------------------------------------ Cardiac Enzymes No results for input(s): CKMB, TROPONINI, MYOGLOBIN in the last 168 hours.  Invalid input(s): CK  Micro Results Recent Results (from the past 240 hours)  Resp panel by RT-PCR (RSV, Flu A&B, Covid) Anterior Nasal Swab     Status: None   Collection Time: 03/28/24  1:31 PM   Specimen: Anterior Nasal Swab  Result Value Ref Range Status   SARS Coronavirus 2 by RT PCR NEGATIVE NEGATIVE Final   Influenza A by PCR NEGATIVE NEGATIVE Final   Influenza B by PCR NEGATIVE NEGATIVE Final    Comment: (NOTE) The Xpert Xpress SARS-CoV-2/FLU/RSV plus assay is intended as an aid in the diagnosis of influenza from Nasopharyngeal swab specimens and should not be used as a sole basis for treatment. Nasal washings and aspirates are unacceptable for Xpert Xpress SARS-CoV-2/FLU/RSV testing.  Fact Sheet for Patients: bloggercourse.com  Fact Sheet for Healthcare Providers: seriousbroker.it  This test is not yet approved or cleared by the [place]  FDA and has been authorized for detection and/or diagnosis of SARS-CoV-2 by FDA under an Emergency Use Authorization (EUA). This EUA will remain in effect (meaning this test can be used) for the duration of the COVID-19 declaration under Section 564(b)(1) of the Act, 21  U.S.C. section 360bbb-3(b)(1), unless the authorization is terminated or revoked.     Resp Syncytial Virus by PCR NEGATIVE NEGATIVE Final    Comment: (NOTE) Fact Sheet for Patients: bloggercourse.com  Fact Sheet for Healthcare Providers: seriousbroker.it  This test is not yet approved or cleared by the [place]  FDA and has been authorized for detection and/or diagnosis of SARS-CoV-2 by FDA under an Emergency Use Authorization (EUA). This EUA will remain in effect (meaning this test can be used) for the duration of the COVID-19 declaration under Section 564(b)(1) of the Act, 21 U.S.C. section 360bbb-3(b)(1), unless the authorization is terminated or revoked.  Performed at [facility], [address] [place], [place] [address]   Culture, blood (Routine X 2) w Reflex to ID Panel     Status: None (Preliminary result)   Collection Time: 03/28/24  1:31 PM   Specimen: BLOOD  Result Value Ref Range Status   Specimen Description BLOOD SITE NOT SPECIFIED  Final   Special Requests   Final    BOTTLES DRAWN AEROBIC AND ANAEROBIC Blood Culture adequate volume   Culture   Final    NO GROWTH < 24 HOURS Performed at [facility], [address], [place], [place] [address]    Report Status PENDING  Incomplete  Blood Culture (routine x 2)     Status:  None (Preliminary result)   Collection Time: 03/28/24  1:36 PM   Specimen: BLOOD  Result Value Ref Range Status   Specimen Description BLOOD SITE NOT SPECIFIED  Final   Special Requests   Final    BOTTLES DRAWN AEROBIC AND ANAEROBIC Blood Culture results may not be optimal due to an inadequate volume of blood received in culture bottles   Culture   Final    NO GROWTH < 24 HOURS Performed at [facility], [address], [place], [place] [address]    Report Status PENDING  Incomplete  Respiratory (~20 pathogens) panel by PCR     Status: Abnormal   Collection Time:  03/28/24  5:09 PM   Specimen: Nasopharyngeal Swab; Respiratory  Result Value Ref Range Status   Adenovirus NOT DETECTED NOT DETECTED Final   Coronavirus 229E NOT DETECTED NOT DETECTED Final    Comment: (NOTE) The Coronavirus on the Respiratory Panel, DOES NOT test for the novel  Coronavirus (2019 nCoV)    Coronavirus HKU1 NOT DETECTED NOT DETECTED Final   Coronavirus NL63 NOT DETECTED NOT DETECTED Final   Coronavirus OC43 NOT DETECTED NOT DETECTED Final   Metapneumovirus NOT DETECTED NOT DETECTED Final   Rhinovirus / Enterovirus NOT DETECTED NOT DETECTED Final   Influenza A NOT DETECTED NOT DETECTED Final   Influenza B NOT DETECTED NOT DETECTED Final   Parainfluenza Virus 1 DETECTED (A) NOT DETECTED Final   Parainfluenza Virus 2 NOT DETECTED NOT DETECTED Final   Parainfluenza Virus 3 NOT DETECTED NOT DETECTED Final   Parainfluenza Virus 4 NOT DETECTED NOT DETECTED Final   Respiratory Syncytial Virus NOT DETECTED NOT DETECTED Final   Bordetella pertussis NOT DETECTED NOT DETECTED Final   Bordetella Parapertussis NOT DETECTED NOT DETECTED Final   Chlamydophila pneumoniae NOT DETECTED NOT DETECTED Final   Mycoplasma pneumoniae NOT DETECTED NOT DETECTED Final    Comment: Performed at [facility], [address], [place], [place] [address]    Radiology Report CT Angio Chest Pulmonary Embolism (PE) W or WO Contrast Result Date: 03/28/2024 EXAM: CTA CHEST 03/28/2024 04:50:00 PM TECHNIQUE: CTA of the chest was performed after the administration of intravenous contrast. Multiplanar reformatted images are provided for review. MIP images are provided for review. Automated exposure control, iterative reconstruction, and/or weight based adjustment of the mA/kV was utilized to reduce the radiation dose to as low as reasonably achievable. 65 mL (iohexol  (OMNIPAQUE ) 350 MG/ML injection 75 mL IOHEXOL  350 MG/ML SOLN). COMPARISON: PET CT 02/02/2024. CLINICAL HISTORY: Pulmonary embolism (PE)  suspected, low to intermediate prob, neg D-dimer. FINDINGS: PULMONARY ARTERIES: Pulmonary arteries are adequately opacified for evaluation. No acute pulmonary embolus. Main pulmonary artery is normal in caliber. 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 Moderate atherosclerotic plaque. The pericardium demonstrates no acute abnormality. The thoracic aorta is normal in caliber. LYMPH NODES: No mediastinal, hilar or axillary lymphadenopathy. LUNGS AND PLEURA: Persistent similar bilateral patchy reticulations with superimposed ground-glass airspace opacities. Interval increase in size of approximately 6 cm right perihilar peribronchovascular consolidation but extends to the right lower and right upper lobes. Associated metallic density centrally measuring 4 mm. Trace right pleural effusion. No pneumothorax. UPPER ABDOMEN: A fluid-density lesion of the superior right renal pole likely represents a simple renal cyst. Simple renal cysts do not require additional follow-up unless clinically indicated due to signs/symptoms. SOFT TISSUES AND BONES: No acute bone or soft tissue abnormality. IMPRESSION: 1. No pulmonary embolism. 2. Interval increase of an  approximately 6 cm right perihilar peribronchovascular consolidation/mass extending into the right upper and right lower lobes, with associated central 4 mm metallic density. 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 3. Persistent bilateral patchy reticulations with superimposed ground-glass opacities. 4. Persistent trace right pleural effusion. 5. Aortic valve leaflet calcification and at least 3 vessel coronary artery calcifications. Electronically signed by: Morgane Naveau MD 03/28/2024 07:00 PM EST RP Workstation: [id number]   DG Chest Port 1 View Result Date: 03/28/2024 EXAM: 1 VIEW(S) XRAY OF THE CHEST 03/28/2024 01:49:27 PM COMPARISON: 06/19/2022 CLINICAL HISTORY: shortness of breath FINDINGS: LUNGS  AND PLEURA: Right hemithorax volume loss. Diffuse interstitial prominence which may reflect mild edema versus atypical infection. Asymmetric elevation of right hemidiaphragm. Right upper lung interstitial opacities, likely posttreatment changes. No pleural effusion. No pneumothorax. HEART AND MEDIASTINUM: No acute abnormality of the cardiac and mediastinal silhouettes. BONES AND SOFT TISSUES: No acute osseous abnormality. IMPRESSION: 1. Right hemithorax volume loss and asymmetric elevation of right hemidiaphragm. Likely reflecting post-treatment changes. 2. Diffuse interstitial prominence, possibly representing mild edema or atypical infection. Electronically signed by: Waddell Calk MD 03/28/2024 02:49 PM EST RP Workstation: [id number]     Signature  -   Lavada Stank M.D on 03/29/2024 at 9:27 AM   -  To page go to [email or web address]   "

## 2024-03-29 NOTE — TOC Initial Note (Signed)
 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   Patient has PCP, manages his own medications, drives, DME reviewed - built-in shower bench.   Currently waiting for therapy evals.  CM will continue to follow.   Expected Discharge Plan:  (TBD) Barriers to Discharge: Continued Medical Work up   Patient Goals and CMS Choice            Expected Discharge Plan and Services       Living arrangements for the past 2 months: Single Family Home                                      Prior Living Arrangements/Services Living arrangements for the past 2 months: Single Family Home Lives with:: Self, Spouse Patient language and need for interpreter reviewed:: Yes Do you feel safe going back to the place where you live?: Yes      Need for Family Participation in Patient Care: Yes (Comment) Care giver support system in place?: Yes (comment) Current home services: DME (built-in shower bench) Criminal Activity/Legal Involvement Pertinent to Current Situation/Hospitalization: No - Comment as needed  Activities of Daily Living   ADL Screening (condition at time of admission) Independently performs ADLs?: Yes (appropriate for developmental age) Is the patient deaf or have difficulty hearing?: No Does the patient have difficulty seeing, even when wearing glasses/contacts?: No Does the patient have difficulty concentrating,  remembering, or making decisions?: No  Permission Sought/Granted                  Emotional Assessment Appearance:: Developmentally appropriate Attitude/Demeanor/Rapport: Engaged Affect (typically observed): Calm Orientation: : Oriented to Self, Oriented to Place, Oriented to  Time, Oriented to Situation Alcohol  / Substance Use: Not Applicable Psych Involvement: No (comment)  Admission diagnosis:  Acute respiratory failure with hypoxia (HCC) [J96.01] Sepsis, due to unspecified organism, unspecified whether acute organ dysfunction present [facility]) [A41.9] Patient Active Problem List   Diagnosis Date Noted   Transient hypotension 03/28/2024   Renal insufficiency 03/28/2024   Hyponatremia 03/28/2024   HLD (hyperlipidemia) 03/28/2024   GERD (gastroesophageal reflux disease) 03/28/2024   Lung malignancy (HCC) 03/28/2024   Malignant neoplasm of prostate (HCC) 02/04/2024   Malignant neoplasm of lower lobe of right lung (HCC) 07/07/2022   Pulmonary nodule 04/17/2022   AAA (abdominal aortic aneurysm) 01/30/2022   Carotid artery stenosis 01/30/2022   Carotid stenosis, asymptomatic 01/30/2022   Rhinovirus infection 08/12/2021   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   Acute respiratory failure with hypoxia (HCC) 08/12/2021   PNA (pneumonia) 08/11/2021   Sepsis due to pneumonia (HCC) 08/11/2021   Macrocytic anemia 08/11/2021   Hypothyroidism 08/11/2021   HTN (hypertension) 08/11/2021   ILD (interstitial lung disease) (HCC) 08/11/2021   A-fib (HCC) 08/11/2021   PCP:  [name], MD Pharmacy:   [facility] [phone] [place]  [address] [place], [place] - [address] AT [facility] [address] [place]. [place] [address] Phone: [phone] Fax: [phone]     Social Drivers of Health (SDOH) Social History: SDOH Screenings   Food Insecurity: No Food Insecurity (03/28/2024)  Housing: Low Risk (03/28/2024)  Transportation Needs: No  Transportation Needs (03/28/2024)  Utilities: Not At Risk (03/28/2024)  Alcohol  Screen: Low Risk (02/05/2024)  Depression (PHQ2-9): Low Risk (02/05/2024)  Social Connections: Socially Integrated (03/28/2024)  Tobacco Use: Medium Risk (03/28/2024)   SDOH Interventions:     Readmission Risk Interventions    01/31/2022   10:29 AM  Readmission Risk Prevention Plan  Transportation Screening Complete  Home Care Screening Complete  Medication Review (RN CM) Complete

## 2024-03-29 NOTE — Progress Notes (Signed)
" °  Echocardiogram 2D Echocardiogram has been performed.  Tinnie FORBES Gosling RDCS 03/29/2024, 1:44 PM "

## 2024-03-29 NOTE — Evaluation (Signed)
 Occupational Therapy Evaluation Patient Details Name: Peter Greene MRN: [id number] DOB: 29-Mar-1941 Today's Date: 03/29/2024   History of Present Illness   83 y.o. 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     Clinical Impressions Pt presents with decline in function and safety with ADLs and ADL mobility with impaired balance and endurance. PTA pt lives with his wife and was Ind with ADLs, IADLs, drives and used no AD for mobility; not on home O2. Pt currently on 4L O2 and requires CGA/Sup with LB ADLs and mobility HHA. Pt's O2 SATs dropping to 83% during in room activity (RN notified) and pt required ~2 minutes to recover to 88-90% with deep pursed lip breathing. Pt stated that he is familiar with breathing techniques due to previous hospitalizations for PNA. OT will follow acutely to maximize level of function and safety     If plan is discharge home, recommend the following:   A little help with bathing/dressing/bathroom;A little help with walking and/or transfers;Help with stairs or ramp for entrance     Functional Status Assessment   Patient has had a recent decline in their functional status and demonstrates the ability to make significant improvements in function in a reasonable and predictable amount of time.     Equipment Recommendations   None recommended by OT     Recommendations for Other Services         Precautions/Restrictions   Precautions Precautions: Other (comment) Precaution/Restrictions Comments: watch O2 SATs, currenlty on 4L O2 Restrictions Weight Bearing Restrictions Per Provider Order: No     Mobility Bed Mobility               General bed mobility comments: pt in chair    Transfers Overall transfer level: Needs  assistance Equipment used: 1 person hand held assist Transfers: Sit to/from Stand, Bed to chair/wheelchair/BSC Sit to Stand: Contact guard assist, Supervision           General transfer comment: CGA initial STS from chair      Balance Overall balance assessment: Needs assistance Sitting-balance support: No upper extremity supported, Feet supported Sitting balance-Leahy Scale: Good     Standing balance support: Single extremity supported, During functional activity Standing balance-Leahy Scale: Fair                             ADL either performed or assessed with clinical judgement   ADL Overall ADL's : Needs assistance/impaired Eating/Feeding: Independent   Grooming: Wash/dry hands;Wash/dry face;Supervision/safety;Standing   Upper Body Bathing: Modified independent   Lower Body Bathing: Contact guard assist;Supervison/ safety;Sit to/from stand   Upper Body Dressing : Modified independent   Lower Body Dressing: Contact guard assist;Supervision/safety;Sit to/from stand   Toilet Transfer: Contact guard assist;Supervision/safety;Ambulation   Toileting- Clothing Manipulation and Hygiene: Supervision/safety;Sit to/from stand       Functional mobility during ADLs: Contact guard assist;Supervision/safety Pt's O2 SATs dropping to 83% during in room activity (RN notified) and pt required ~2 minutes to recover to 88-90% with deep pursed lip breathing. Pt stated that he is familiar with breathing techniques due to previous hospitalizations for PNA.       Vision Baseline Vision/History: 1 Wears glasses Ability to See in Adequate Light: 0 Adequate Patient Visual Report: No change from baseline  Perception         Praxis         Pertinent Vitals/Pain Pain Assessment Pain Assessment: No/denies pain     Extremity/Trunk Assessment Upper Extremity Assessment Upper Extremity Assessment: Overall WFL for tasks assessed;Right hand dominant            Communication Communication Communication: No apparent difficulties   Cognition Arousal: Alert Behavior During Therapy: WFL for tasks assessed/performed                                 Following commands: Intact       Cueing  General Comments          Exercises     Shoulder Instructions      Home Living Family/patient expects to be discharged to:: Private residence Living Arrangements: Spouse/significant other Available Help at Discharge: Family Type of Home: House Home Access: Stairs to enter [employer] of Steps: 2 Entrance Stairs-Rails: Right;Left Home Layout: Two level;Able to live on main level with bedroom/bathroom Alternate Level Stairs-Number of Steps: only office on second level   Bathroom Shower/Tub: Producer, Television/film/video: Handicapped height     Home Equipment: Shower seat;Hand held shower head   Additional Comments: not on home O2      Prior Functioning/Environment Prior Level of Function : Independent/Modified Independent;Driving             Mobility Comments: no AD for mobility ADLs Comments: Ind with ADLs, IADLs    OT Problem List: Impaired balance (sitting and/or standing);Decreased activity tolerance   OT Treatment/Interventions: Self-care/ADL training;Patient/family education;Therapeutic activities;Energy conservation;DME and/or AE instruction      OT Goals(Current goals can be found in the care plan section)   Acute Rehab OT Goals Patient Stated Goal: go home OT Goal Formulation: With patient Time For Goal Achievement: 04/12/24 Potential to Achieve Goals: Good ADL Goals Pt Will Perform Grooming: with set-up;with modified independence;standing Pt Will Perform Lower Body Bathing: with supervision;with modified independence;sit to/from stand Pt Will Perform Lower Body Dressing: with supervision;with modified independence;sit to/from stand Pt Will Transfer to Toilet: with supervision;with  modified independence;ambulating Pt Will Perform Toileting - Clothing Manipulation and hygiene: with modified independence Pt Will Perform Tub/Shower Transfer: with contact guard assist;with supervision;ambulating;shower seat;grab bars Additional ADL Goal #1: Pt will verbalize and demo 3 energy conservation strategies for ADLs and ADL mobility tasks   OT Frequency:  Min 2X/week    Co-evaluation              AM-PAC OT 6 Clicks Daily Activity     Outcome Measure Help from another person eating meals?: None Help from another person taking care of personal grooming?: A Little Help from another person toileting, which includes using toliet, bedpan, or urinal?: A Little Help from another person bathing (including washing, rinsing, drying)?: A Little Help from another person to put on and taking off regular upper body clothing?: None Help from another person to put on and taking off regular lower body clothing?: A Little 6 Click Score: 20   End of Session Equipment Utilized During Treatment: Gait belt Nurse Communication: Mobility status;Other (comment) (O2 SATs dropping to 83% on 4L O2 during minimla exertion)  Activity Tolerance: Patient tolerated treatment well Patient left: in chair;with call bell/phone within reach  OT Visit Diagnosis: Muscle weakness (generalized) (M62.81)                Time:  8856-8791 OT Time Calculation (min): 25 min Charges:  OT General Charges $OT Visit: 1 Visit OT Evaluation $OT Eval Moderate Complexity: 1 Mod OT Treatments $Therapeutic Activity: 8-22 mins    Jacques Karna Loose 03/29/2024, 12:21 PM

## 2024-03-29 NOTE — Evaluation (Signed)
 Physical Therapy Evaluation Patient Details Name: Peter Greene MRN: [id number] DOB: 11-03-1940 Today's Date: 03/29/2024  History of Present Illness  83 y.o male presents to [facility] on 12/29 for pneumonia, respiratory failure, and hypoxia. Does not wear oxygen at home. CT (-) for PE. PMH: HLD, a-fib, MDS s/p stem cell transplant, ILD w/ pulmonary fibrosis, prostate CA.  Clinical Impression  Pt is currently mobilizing below his baseline due to SOB with functional activity. Pt seated in recliner upon arrival, satting 91% on 4L O2. Pt CGA for STS and short distance ambulation to sink in room. Pt SpO2 decreases to 85% with standing mobility, improves to 90% during static stance at sink. O2 sats decrease to mid-80's again upon return to chair. Pt educated on pursed lip breathing techniques, without improvement. Pt increased to 6L for recovery and weaned back down to 4L at 90% at end of session. Pt would benefit from continued PT services focused on balance, DME education, and progressing activity tolerance to promote return to PLOF.        If plan is discharge home, recommend the following: A little help with walking and/or transfers;A little help with bathing/dressing/bathroom;Assistance with cooking/housework;Assist for transportation;Help with stairs or ramp for entrance   Can travel by private vehicle        Equipment Recommendations None recommended by PT (Pt has appropriate DME at home)  Recommendations for Other Services       Functional Status Assessment Patient has had a recent decline in their functional status and demonstrates the ability to make significant improvements in function in a reasonable and predictable amount of time.     Precautions / Restrictions Precautions Precautions: Fall Recall of Precautions/Restrictions: Intact Precaution/Restrictions Comments: watch O2 SATs, currenlty on 4L O2 Restrictions Weight Bearing Restrictions Per Provider Order: No      Mobility   Bed Mobility               General bed mobility comments: Pt seated in recliner upon arrival.    Transfers Overall transfer level: Needs assistance Equipment used: None Transfers: Sit to/from Stand Sit to Stand: Contact guard assist           General transfer comment: Pt slightly unsteady upon standing, becomes increasingly SOB with minimal activity.    Ambulation/Gait Ambulation/Gait assistance: Contact guard assist Gait Distance (Feet): 10 Feet (10'x2) Assistive device: None Gait Pattern/deviations: Step-through pattern, Decreased step length - right, Decreased step length - left, Narrow base of support   Gait velocity interpretation: <1.8 ft/sec, indicate of risk for recurrent falls   General Gait Details: Pt ambulates around bed to sink in room, slightly unsteady, grabs railing of bed as needed. Assist needed for line management.  Stairs            Wheelchair Mobility     Tilt Bed    Modified Rankin (Stroke Patients Only)       Balance Overall balance assessment: Needs assistance Sitting-balance support: No upper extremity supported, Feet supported Sitting balance-Leahy Scale: Good Sitting balance - Comments: No sitting balance concerns.   Standing balance support: Single extremity supported, During functional activity, No upper extremity supported Standing balance-Leahy Scale: Fair Standing balance comment: Pt slightly unsteady throughout standing tasks, pt would likely benefit from [facility] to assist with balance during functional tasks. Good static balance at sink without UE support.  Pertinent Vitals/Pain Pain Assessment Pain Assessment: No/denies pain    Home Living Family/patient expects to be discharged to:: Private residence Living Arrangements: Spouse/significant other Available Help at Discharge: Family;Available 24 hours/day Type of Home: House Home Access: Stairs to enter Entrance Stairs-Rails:  Right;Left Entrance Stairs-Number of Steps: 2 Alternate Level Stairs-Number of Steps: only office on second level, one flight of stairs to reach office Home Layout: Two level;Able to live on main level with bedroom/bathroom Home Equipment: Shower seat;Hand held shower head;Cane - single point;Rolling Walker (2 wheels);Grab bars - tub/shower;Grab bars - toilet Additional Comments: not on home O2    Prior Function Prior Level of Function : Independent/Modified Independent;Driving             Mobility Comments: no AD for mobility ADLs Comments: Ind with ADLs, IADLs     Extremity/Trunk Assessment   Upper Extremity Assessment Upper Extremity Assessment: Defer to OT evaluation    Lower Extremity Assessment Lower Extremity Assessment: Overall WFL for tasks assessed;Generalized weakness    Cervical / Trunk Assessment Cervical / Trunk Assessment: Normal  Communication   Communication Communication: No apparent difficulties    Cognition Arousal: Alert Behavior During Therapy: WFL for tasks assessed/performed   PT - Cognitive impairments: No apparent impairments                         Following commands: Intact       Cueing Cueing Techniques: Verbal cues, Tactile cues, Visual cues     General Comments General comments (skin integrity, edema, etc.): SpO2 monitored throughout, decreased to 85% with mobility. Increased to 6L to recover. No significant skin abnormalities noted.    Exercises     Assessment/Plan    PT Assessment Patient needs continued PT services  PT Problem List Decreased strength;Decreased mobility;Decreased safety awareness;Decreased knowledge of precautions;Decreased activity tolerance;Decreased balance;Decreased knowledge of use of DME;Cardiopulmonary status limiting activity       PT Treatment Interventions DME instruction;Therapeutic exercise;Gait training;Balance training;Stair training;Neuromuscular re-education;Functional mobility  training;Therapeutic activities;Patient/family education    PT Goals (Current goals can be found in the Care Plan section)  Acute Rehab PT Goals Patient Stated Goal: No additional goals stated. Additional Goals Additional Goal #1: Pt will maintain >93% SpO2 throughout functional mobility to demonstrate improved respiratory status.    Frequency Min 2X/week     Co-evaluation               AM-PAC PT 6 Clicks Mobility  Outcome Measure Help needed turning from your back to your side while in a flat bed without using bedrails?: None Help needed moving from lying on your back to sitting on the side of a flat bed without using bedrails?: None Help needed moving to and from a bed to a chair (including a wheelchair)?: None Help needed standing up from a chair using your arms (e.g., wheelchair or bedside chair)?: None Help needed to walk in hospital room?: A Little Help needed climbing 3-5 steps with a railing? : A Lot 6 Click Score: 21    End of Session Equipment Utilized During Treatment: Oxygen Activity Tolerance:  (Patient limited by SOB) Patient left: in chair;with call bell/phone within reach Nurse Communication: Mobility status PT Visit Diagnosis: Unsteadiness on feet (R26.81);Muscle weakness (generalized) (M62.81)    Time: 8992-8973 PT Time Calculation (min) (ACUTE ONLY): 19 min   Charges:   PT Evaluation $PT Eval Moderate Complexity: 1 Mod   PT General Charges $$ ACUTE PT VISIT: 1 Visit  Sabra Morel, PT, DPT  Acute Rehabilitation Services         Office: [phone]     Sabra MARLA Morel 03/29/2024, 4:21 PM

## 2024-03-30 ENCOUNTER — Telehealth: Payer: Self-pay | Admitting: Physician Assistant

## 2024-03-30 DIAGNOSIS — J9601 Acute respiratory failure with hypoxia: Secondary | ICD-10-CM | POA: Diagnosis not present

## 2024-03-30 LAB — CBC WITH DIFFERENTIAL/PLATELET
Abs Immature Granulocytes: 0.03 K/uL (ref 0.00–0.07)
Basophils Absolute: 0 K/uL (ref 0.0–0.1)
Basophils Relative: 0 %
Eosinophils Absolute: 0 K/uL (ref 0.0–0.5)
Eosinophils Relative: 0 %
HCT: 30.6 % — ABNORMAL LOW (ref 39.0–52.0)
Hemoglobin: 10.1 g/dL — ABNORMAL LOW (ref 13.0–17.0)
Immature Granulocytes: 0 %
Lymphocytes Relative: 16 %
Lymphs Abs: 1.3 K/uL (ref 0.7–4.0)
MCH: 30.1 pg (ref 26.0–34.0)
MCHC: 33 g/dL (ref 30.0–36.0)
MCV: 91.3 fL (ref 80.0–100.0)
Monocytes Absolute: 0.5 K/uL (ref 0.1–1.0)
Monocytes Relative: 6 %
Neutro Abs: 6.7 K/uL (ref 1.7–7.7)
Neutrophils Relative %: 78 %
Platelets: 213 K/uL (ref 150–400)
RBC: 3.35 MIL/uL — ABNORMAL LOW (ref 4.22–5.81)
RDW: 15.8 % — ABNORMAL HIGH (ref 11.5–15.5)
WBC: 8.5 K/uL (ref 4.0–10.5)
nRBC: 0 % (ref 0.0–0.2)

## 2024-03-30 LAB — PROCALCITONIN: Procalcitonin: 0.28 ng/mL

## 2024-03-30 LAB — BASIC METABOLIC PANEL WITH GFR
Anion gap: 11 (ref 5–15)
BUN: 35 mg/dL — ABNORMAL HIGH (ref 8–23)
CO2: 23 mmol/L (ref 22–32)
Calcium: 8.6 mg/dL — ABNORMAL LOW (ref 8.9–10.3)
Chloride: 105 mmol/L (ref 98–111)
Creatinine, Ser: 1 mg/dL (ref 0.61–1.24)
GFR, Estimated: >60 mL/min
Glucose, Bld: 103 mg/dL — ABNORMAL HIGH (ref 70–99)
Potassium: 4.1 mmol/L (ref 3.5–5.1)
Sodium: 139 mmol/L (ref 135–145)

## 2024-03-30 LAB — C-REACTIVE PROTEIN: CRP: 4.6 mg/dL — ABNORMAL HIGH

## 2024-03-30 LAB — MAGNESIUM: Magnesium: 2.2 mg/dL (ref 1.7–2.4)

## 2024-03-30 MED ORDER — METHYLPREDNISOLONE SODIUM SUCC 125 MG IJ SOLR
60.0000 mg | INTRAMUSCULAR | Status: DC
  Administered 2024-03-30 – 2024-04-03 (×5): 60 mg via INTRAVENOUS
  Filled 2024-03-30 (×6): qty 2

## 2024-03-30 MED ORDER — MENTHOL 3 MG MT LOZG
1.0000 | LOZENGE | OROMUCOSAL | Status: DC | PRN
  Administered 2024-03-30 – 2024-03-31 (×2): 3 mg via ORAL
  Filled 2024-03-30 (×2): qty 9

## 2024-03-30 NOTE — Progress Notes (Signed)
 "                                                                                                                                                                                                                                                                                PROGRESS NOTE     Patient Demographics:    Peter Greene, is a 83 y.o. male, DOB - Apr 02, 1940, FMW:[id number]  Outpatient Primary MD for the patient is Peter Ade, MD    LOS - 2  Admit date - 03/28/2024    Chief Complaint  Patient presents with   Pneumonia        Shortness of Breath       Brief Narrative (HPI from H&P)   83 y.o. 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  He is accompanied by his wife, Peter Greene.   He has been experiencing shortness of breath, lightheadedness, and a cough for the past two days, along with a low-grade fever reaching 100.52F, which lasted for about a day. These symptoms are similar to previous episodes of pneumonia he has experienced.   He has a history of pulmonary nodules, which were suspected to be cancerous, although biopsies did not confirm this. He underwent radiation treatment to the chest for these nodules.   He started taking levofloxacin for three days ago for his symptoms, but it did not alleviate them as it usually does.  At his primary care provider office today he was seen and noted to be hypoxic with O2 saturations at 86% on room air.  Typically patient is not on oxygen at baseline.  Patient had been given a shot of Rocephin  as well as Solu-Medrol while in the office.   Subjective:   Patient in bed, appears comfortable, denies any headache, no fever, no chest pain or pressure, improving shortness of breath , no abdominal pain. No focal weakness.   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 Patient with underlying history of ILD, does not use oxygen, lung cancer, chronically follows with [facility] pulmonary Dr. Forrestine, took outpatient Levaquin course without any benefit, admitted to the hospital yesterday for possible community-acquired pneumonia, started on empiric antibiotics along with a trial of Lasix  as he has developed some crackles overnight, encouraged to sit in chair use I-S and flutter valve.  Advance activity and titrate down oxygen.  He is still a little hypoxic will add IV steroids, taper down empiric antibiotics as procalcitonin stable follow cultures, stable echocardiogram   Hyponatremia Acute.  Sodium noted to be 127.  Due to dehydration.  Resolved.   Interstitial lung disease  Patient with a history of interstitial lung disease with pulmonary fibrosis as well as postradiation fibrosis followed by Dr. [name].  Patient had been given steroids at his primary care office prior to arrival.  Continue supportive care and monitor.   Paroxysmal/chronic atrial fibrillation .  Patient appears to be in atrial fibrillation currently rate controlled.  Continue Eliquis , low-dose Lopressor .  Advised to score greater than 4.   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   Hyperlipidemia  - Continue atorvastatin    GERD  - Continue Protonix     Lung malignancy  Patient with a prior history of positive PET scans, but negative biopsies for which he underwent SBRT.  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  Continue outpatient follow-up with Dr Peter Greene     Prostate cancer  - Continue relugolix.  Patient's wife to bring home supply       Condition - Guarded  Family Communication  : Wife Peter Greene [phone] on 03/29/2024 at 9:31 AM  Code Status :  Full  Consults  :  None  PUD Prophylaxis : PPI   Procedures  :     TTE - 1. Left ventricular ejection fraction, by estimation, is 55 to 60%. The left ventricle has normal function. The left ventricle has no regional wall motion abnormalities. There is mild concentric left ventricular hypertrophy. Left ventricular diastolic parameters are consistent with Grade II diastolic dysfunction (pseudonormalization).  2. Right ventricular systolic function is normal. The right ventricular size is normal.  3. Left atrial size was moderately dilated.  4. The mitral valve is normal in structure. No evidence of mitral valve regurgitation. No evidence of mitral stenosis.  5. The aortic valve was not well visualized. There is moderate calcification of the aortic valve. Aortic valve regurgitation is not visualized. No aortic stenosis is present.  6. Aortic dilatation noted. There is borderline dilatation of the aortic root, measuring 38 mm.  7. The inferior vena cava is normal in size with greater than 50% respiratory variability, suggesting right atrial pressure of 3 mmHg.  8. Technically difficult study with poor acoustic windows.  CT - 1. No pulmonary embolism. 2. Interval increase of an approximately 6 cm right perihilar peribronchovascular consolidation/mass extending into the right upper and right lower lobes, with associated central 4 mm metallic density. 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 3. Persistent bilateral patchy reticulations with superimposed ground-glass opacities. 4. Persistent trace right pleural effusion. 5. Aortic valve leaflet calcification and at least 3 vessel coronary artery calcifications.      Disposition Plan  :    Status is: Inpatient  DVT Prophylaxis  :    apixaban  (  ELIQUIS ) tablet 5 mg   Lab Results  Component Value Date   PLT 213 03/30/2024     Diet :  Diet Order             Diet Heart Room service appropriate? Yes; Fluid consistency: Thin  Diet effective now                    Inpatient Medications  Scheduled Meds:  apixaban   5 mg Oral BID   aspirin  EC  81 mg Oral q AM   atorvastatin   40 mg Oral q AM   fluticasone   1 spray Each Nare Daily   guaiFENesin   600 mg Oral BID   levothyroxine   50 mcg Oral Q0600   methylPREDNISolone (SOLU-MEDROL) injection  60 mg Intravenous Q24H   metoprolol  tartrate  25 mg Oral BID   omega-3 acid ethyl esters  1 g Oral BID   pantoprazole   20 mg Oral QPM   relugolix  120 mg Oral Daily   sodium chloride  flush  3 mL Intravenous Q12H   tamsulosin   0.4 mg Oral q AM   valACYclovir   500 mg Oral q AM   Continuous Infusions:  azithromycin  500 mg (03/29/24 1213)   cefTRIAXone  (ROCEPHIN )  IV 2 g (03/29/24 1120)   PRN Meds:.acetaminophen  **OR** acetaminophen , albuterol     Objective:   Vitals:   03/30/24 0404 03/30/24 0800 03/30/24 0828 03/30/24 0831  BP:  126/77 126/77   Pulse: 62 (!) 56 75 69  Resp: 14 12 (!) 23 (!) 22  Temp:  (!) 97.2 F (36.2 C)    TempSrc:  Oral    SpO2: 92% 95% (!) 88% 91%  Weight:      Height:        Wt Readings from Last 3 Encounters:  03/28/24 92 kg  02/05/24 91.7 kg  01/06/24 89.8 kg     Intake/Output Summary (Last 24 hours) at 03/30/2024 0949 Last data filed at 03/30/2024 0400 Gross per 24 hour  Intake 590 ml  Output 1200 ml  Net -610 ml     Physical Exam  Awake Alert, No new F.N deficits, Normal affect Dent.AT,PERRAL Supple Neck, No JVD,   Symmetrical Chest wall movement, Good air movement bilaterally, +ve rales RRR,No Gallops,Rubs or new Murmurs,  +ve B.Sounds, Abd Soft, No tenderness,   No Cyanosis, Clubbing or edema        Data Review:    Recent Labs  Lab 03/28/24 1354 03/29/24 0414 03/30/24 0401  WBC 7.6 5.1 8.5  HGB 11.2* 12.3* 10.1*  HCT 33.6* 37.6* 30.6*  PLT 239 227 213  MCV 91.8 92.6 91.3  MCH 30.6 30.3 30.1   MCHC 33.3 32.7 33.0  RDW 15.5 15.7* 15.8*  LYMPHSABS 1.8  --  1.3  MONOABS 0.7  --  0.5  EOSABS 0.2  --  0.0  BASOSABS 0.0  --  0.0    Recent Labs  Lab 03/28/24 1347 03/28/24 1354 03/28/24 1759 03/29/24 0414 03/29/24 0618 03/30/24 0401  NA  --  127*  --  139  --  139  K  --  4.2  --  4.5  --  4.1  CL  --  94*  --  105  --  105  CO2  --  21*  --  22  --  23  ANIONGAP  --  13  --  11  --  11  GLUCOSE  --  116*  --  146*  --  103*  BUN  --  32*  --  27*  --  35*  CREATININE  --  1.51*  --  1.21  --  1.00  AST  --  39  --   --   --   --   ALT  --  21  --   --   --   --   ALKPHOS  --  71  --   --   --   --   BILITOT  --  0.6  --   --   --   --   ALBUMIN  --  3.9  --   --   --   --   CRP  --   --   --   --  10.1* 4.6*  PROCALCITON  --   --  0.41  --  0.39 0.28  LATICACIDVEN 1.6  --   --   --   --   --   INR  --  1.6*  --   --   --   --   MG  --   --   --   --   --  2.2  CALCIUM   --  8.8*  --  9.1  --  8.6*      Recent Labs  Lab 03/28/24 1347 03/28/24 1354 03/28/24 1759 03/29/24 0414 03/29/24 0618 03/30/24 0401  CRP  --   --   --   --  10.1* 4.6*  PROCALCITON  --   --  0.41  --  0.39 0.28  LATICACIDVEN 1.6  --   --   --   --   --   INR  --  1.6*  --   --   --   --   MG  --   --   --   --   --  2.2  CALCIUM   --  8.8*  --  9.1  --  8.6*    ProBNP (last 3 results) Recent Labs    03/28/24 1759 03/29/24 0618  PROBNP 737.0* 498.0*    --------------------------------------------------------------------------------------------------------------- Lab Results  Component Value Date   CHOL 112 01/31/2022   HDL 30 (L) 01/31/2022   LDLCALC 71 01/31/2022   TRIG 54 01/31/2022   CHOLHDL 3.7 01/31/2022    No results found for: HGBA1C No results for input(s): TSH, T4TOTAL, FREET4, T3FREE, THYROIDAB in the last 72 hours. No results for input(s): VITAMINB12, FOLATE, FERRITIN, TIBC, IRON, RETICCTPCT in the last 72  hours. ------------------------------------------------------------------------------------------------------------------ Cardiac Enzymes No results for input(s): CKMB, TROPONINI, MYOGLOBIN in the last 168 hours.  Invalid input(s): CK  Micro Results Recent Results (from the past 240 hours)  Resp panel by RT-PCR (RSV, Flu A&B, Covid) Anterior Nasal Swab     Status: None   Collection Time: 03/28/24  1:31 PM   Specimen: Anterior Nasal Swab  Result Value Ref Range Status   SARS Coronavirus 2 by RT PCR NEGATIVE NEGATIVE Final   Influenza A by PCR NEGATIVE NEGATIVE Final   Influenza B by PCR NEGATIVE NEGATIVE Final    Comment: (NOTE) The Xpert Xpress SARS-CoV-2/FLU/RSV plus assay is intended as an aid in the diagnosis of influenza from Nasopharyngeal swab specimens and should not be used as a sole basis for treatment. Nasal washings and aspirates are unacceptable for Xpert Xpress SARS-CoV-2/FLU/RSV testing.  Fact Sheet for Patients: bloggercourse.com  Fact Sheet for Healthcare Providers: seriousbroker.it  This test is not yet approved or cleared by the [place]  FDA and has  been authorized for detection and/or diagnosis of SARS-CoV-2 by FDA under an Emergency Use Authorization (EUA). This EUA will remain in effect (meaning this test can be used) for the duration of the COVID-19 declaration under Section 564(b)(1) of the Act, 21 U.S.C. section 360bbb-3(b)(1), unless the authorization is terminated or revoked.     Resp Syncytial Virus by PCR NEGATIVE NEGATIVE Final    Comment: (NOTE) Fact Sheet for Patients: bloggercourse.com  Fact Sheet for Healthcare Providers: seriousbroker.it  This test is not yet approved or cleared by the [place]  FDA and has been authorized for detection and/or diagnosis of SARS-CoV-2 by FDA under an Emergency Use Authorization (EUA). This  EUA will remain in effect (meaning this test can be used) for the duration of the COVID-19 declaration under Section 564(b)(1) of the Act, 21 U.S.C. section 360bbb-3(b)(1), unless the authorization is terminated or revoked.  Performed at [facility], [address] [place], [place] [address]   Culture, blood (Routine X 2) w Reflex to ID Panel     Status: None (Preliminary result)   Collection Time: 03/28/24  1:31 PM   Specimen: BLOOD  Result Value Ref Range Status   Specimen Description BLOOD SITE NOT SPECIFIED  Final   Special Requests   Final    BOTTLES DRAWN AEROBIC AND ANAEROBIC Blood Culture adequate volume   Culture   Final    NO GROWTH 2 DAYS Performed at [facility], [address], [place], [place] [address]    Report Status PENDING  Incomplete  Blood Culture (routine x 2)     Status: None (Preliminary result)   Collection Time: 03/28/24  1:36 PM   Specimen: BLOOD  Result Value Ref Range Status   Specimen Description BLOOD SITE NOT SPECIFIED  Final   Special Requests   Final    BOTTLES DRAWN AEROBIC AND ANAEROBIC Blood Culture results may not be optimal due to an inadequate volume of blood received in culture bottles   Culture   Final    NO GROWTH 2 DAYS Performed at [facility], [address] [place], [place] [address]    Report Status PENDING  Incomplete  Respiratory (~20 pathogens) panel by PCR     Status: Abnormal   Collection Time: 03/28/24  5:09 PM   Specimen: Nasopharyngeal Swab; Respiratory  Result Value Ref Range Status   Adenovirus NOT DETECTED NOT DETECTED Final   Coronavirus 229E NOT DETECTED NOT DETECTED Final    Comment: (NOTE) The Coronavirus on the Respiratory Panel, DOES NOT test for the novel  Coronavirus (2019 nCoV)    Coronavirus HKU1 NOT DETECTED NOT DETECTED Final   Coronavirus NL63 NOT DETECTED NOT DETECTED Final   Coronavirus OC43 NOT DETECTED NOT DETECTED Final   Metapneumovirus NOT DETECTED NOT DETECTED Final    Rhinovirus / Enterovirus NOT DETECTED NOT DETECTED Final   Influenza A NOT DETECTED NOT DETECTED Final   Influenza B NOT DETECTED NOT DETECTED Final   Parainfluenza Virus 1 DETECTED (A) NOT DETECTED Final   Parainfluenza Virus 2 NOT DETECTED NOT DETECTED Final   Parainfluenza Virus 3 NOT DETECTED NOT DETECTED Final   Parainfluenza Virus 4 NOT DETECTED NOT DETECTED Final   Respiratory Syncytial Virus NOT DETECTED NOT DETECTED Final   Bordetella pertussis NOT DETECTED NOT DETECTED Final   Bordetella Parapertussis NOT DETECTED NOT DETECTED Final   Chlamydophila pneumoniae NOT DETECTED NOT DETECTED Final   Mycoplasma pneumoniae NOT DETECTED NOT DETECTED Final    Comment:  Performed at [facility], [address], [place], [place] [address]    Radiology Report ECHOCARDIOGRAM COMPLETE Result Date: 03/29/2024    ECHOCARDIOGRAM REPORT   Patient Name:   RAMONTE MENA Date of Exam: 03/29/2024 Medical Rec #:  [id number]       Height:       68.0 in Accession #:    [id number]      Weight:       202.8 lb Date of Birth:  [birth year]      BSA:          2.056 m Patient Age:    83 years        BP:           128/76 mmHg Patient Gender: M               HR:           68 bpm. Exam Location:  Inpatient Procedure: 2D Echo, Cardiac Doppler, Color Doppler and Intracardiac            Opacification Agent (Both Spectral and Color Flow Doppler were            utilized during procedure). Indications:     CHF Acute Diastolic I50.31  History:         Patient has prior history of Echocardiogram examinations, most                  recent 08/12/2021. Arrythmias:Atrial Fibrillation; Risk                  Factors:Hypertension.  Sonographer:     Tinnie Gosling RDCS Referring Phys:  [id number] POUR [facility] Diagnosing Phys: Ezra Kanner IMPRESSIONS  1. Left ventricular ejection fraction, by estimation, is 55 to 60%. The left ventricle has normal function. The left ventricle has no regional wall motion abnormalities. There is mild  concentric left ventricular hypertrophy. Left ventricular diastolic parameters are consistent with Grade II diastolic dysfunction (pseudonormalization).  2. Right ventricular systolic function is normal. The right ventricular size is normal.  3. Left atrial size was moderately dilated.  4. The mitral valve is normal in structure. No evidence of mitral valve regurgitation. No evidence of mitral stenosis.  5. The aortic valve was not well visualized. There is moderate calcification of the aortic valve. Aortic valve regurgitation is not visualized. No aortic stenosis is present.  6. Aortic dilatation noted. There is borderline dilatation of the aortic root, measuring 38 mm.  7. The inferior vena cava is normal in size with greater than 50% respiratory variability, suggesting right atrial pressure of 3 mmHg.  8. Technically difficult study with poor acoustic windows. FINDINGS  Left Ventricle: Left ventricular ejection fraction, by estimation, is 55 to 60%. The left ventricle has normal function. The left ventricle has no regional wall motion abnormalities. The left ventricular internal cavity size was normal in size. There is  mild concentric left ventricular hypertrophy. Left ventricular diastolic parameters are consistent with Grade II diastolic dysfunction (pseudonormalization). Right Ventricle: The right ventricular size is normal. No increase in right ventricular wall thickness. Right ventricular systolic function is normal. Left Atrium: Left atrial size was moderately dilated. Right Atrium: Right atrial size was normal in size. Pericardium: There is no evidence of pericardial effusion. Mitral Valve: The mitral valve is normal in structure. No evidence of mitral valve regurgitation. No evidence of mitral valve stenosis. Tricuspid Valve: The tricuspid valve is not well visualized. Tricuspid valve regurgitation is not demonstrated.  Aortic Valve: The aortic valve was not well visualized. There is moderate  calcification of the aortic valve. Aortic valve regurgitation is not visualized. No aortic stenosis is present. Aortic valve mean gradient measures 3.0 mmHg. Aortic valve peak gradient measures 5.5 mmHg. Aortic valve area, by VTI measures 1.60 cm. Pulmonic Valve: The pulmonic valve was normal in structure. Pulmonic valve regurgitation is not visualized. Aorta: Aortic dilatation noted. There is borderline dilatation of the aortic root, measuring 38 mm. Venous: The inferior vena cava is normal in size with greater than 50% respiratory variability, suggesting right atrial pressure of 3 mmHg. IAS/Shunts: No atrial level shunt detected by color flow Doppler.  LEFT VENTRICLE PLAX 2D LVIDd:         4.70 cm   Diastology LVIDs:         3.50 cm   LV e' medial:    5.77 cm/s LV PW:         1.00 cm   LV E/e' medial:  10.2 LV IVS:        1.10 cm   LV e' lateral:   8.59 cm/s LVOT diam:     2.10 cm   LV E/e' lateral: 6.9 LV SV:         41 LV SV Index:   20 LVOT Area:     3.46 cm  RIGHT VENTRICLE RV S prime:     12.70 cm/s  PULMONARY VEINS TAPSE (M-mode): 2.0 cm      Diastolic Velocity: 24.00 cm/s                             S/D Velocity:       0.70                             Systolic Velocity:  16.60 cm/s LEFT ATRIUM           Index        RIGHT ATRIUM           Index LA diam:      4.30 cm 2.09 cm/m   RA Area:     15.40 cm LA Vol (A4C): 85.7 ml 41.68 ml/m  RA Volume:   34.10 ml  16.59 ml/m  AORTIC VALVE AV Area (Vmax):    1.70 cm AV Area (Vmean):   1.51 cm AV Area (VTI):     1.60 cm AV Vmax:           117.00 cm/s AV Vmean:          82.100 cm/s AV VTI:            0.254 m AV Peak Grad:      5.5 mmHg AV Mean Grad:      3.0 mmHg LVOT Vmax:         57.40 cm/s LVOT Vmean:        35.800 cm/s LVOT VTI:          0.117 m LVOT/AV VTI ratio: 0.46  AORTA Ao Root diam: 3.80 cm Ao Asc diam:  3.50 cm MITRAL VALVE MV Area (PHT): 3.40 cm    SHUNTS MV Decel Time: 223 msec    Systemic VTI:  0.12 m MV E velocity: 59.10 cm/s  Systemic Diam:  2.10 cm MV A velocity: 54.80 cm/s MV E/A ratio:  1.08 Dalton McleanMD Electronically signed by Ezra Kanner Signature Date/Time: 03/29/2024/4:06:23 PM  Final (Updated)    CT Angio Chest Pulmonary Embolism (PE) W or WO Contrast Result Date: 03/28/2024 EXAM: CTA CHEST 03/28/2024 04:50:00 PM TECHNIQUE: CTA of the chest was performed after the administration of intravenous contrast. Multiplanar reformatted images are provided for review. MIP images are provided for review. Automated exposure control, iterative reconstruction, and/or weight based adjustment of the mA/kV was utilized to reduce the radiation dose to as low as reasonably achievable. 65 mL (iohexol  (OMNIPAQUE ) 350 MG/ML injection 75 mL IOHEXOL  350 MG/ML SOLN). COMPARISON: PET CT 02/02/2024. CLINICAL HISTORY: Pulmonary embolism (PE) suspected, low to intermediate prob, neg D-dimer. FINDINGS: PULMONARY ARTERIES: Pulmonary arteries are adequately opacified for evaluation. No acute pulmonary embolus. Main pulmonary artery is normal in caliber. 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 Moderate atherosclerotic plaque. The pericardium demonstrates no acute abnormality. The thoracic aorta is normal in caliber. LYMPH NODES: No mediastinal, hilar or axillary lymphadenopathy. LUNGS AND PLEURA: Persistent similar bilateral patchy reticulations with superimposed ground-glass airspace opacities. Interval increase in size of approximately 6 cm right perihilar peribronchovascular consolidation but extends to the right lower and right upper lobes. Associated metallic density centrally measuring 4 mm. Trace right pleural effusion. No pneumothorax. UPPER ABDOMEN: A fluid-density lesion of the superior right renal pole likely represents a simple renal cyst. Simple renal cysts do not require additional follow-up unless clinically indicated due to signs/symptoms. SOFT TISSUES AND BONES: No acute bone or  soft tissue abnormality. IMPRESSION: 1. No pulmonary embolism. 2. Interval increase of an approximately 6 cm right perihilar peribronchovascular consolidation/mass extending into the right upper and right lower lobes, with associated central 4 mm metallic density. 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 3. Persistent bilateral patchy reticulations with superimposed ground-glass opacities. 4. Persistent trace right pleural effusion. 5. Aortic valve leaflet calcification and at least 3 vessel coronary artery calcifications. Electronically signed by: Morgane Naveau MD 03/28/2024 07:00 PM EST RP Workstation: [id number]   DG Chest Port 1 View Result Date: 03/28/2024 EXAM: 1 VIEW(S) XRAY OF THE CHEST 03/28/2024 01:49:27 PM COMPARISON: 06/19/2022 CLINICAL HISTORY: shortness of breath FINDINGS: LUNGS AND PLEURA: Right hemithorax volume loss. Diffuse interstitial prominence which may reflect mild edema versus atypical infection. Asymmetric elevation of right hemidiaphragm. Right upper lung interstitial opacities, likely posttreatment changes. No pleural effusion. No pneumothorax. HEART AND MEDIASTINUM: No acute abnormality of the cardiac and mediastinal silhouettes. BONES AND SOFT TISSUES: No acute osseous abnormality. IMPRESSION: 1. Right hemithorax volume loss and asymmetric elevation of right hemidiaphragm. Likely reflecting post-treatment changes. 2. Diffuse interstitial prominence, possibly representing mild edema or atypical infection. Electronically signed by: Waddell Calk MD 03/28/2024 02:49 PM EST RP Workstation: [id number]     Signature  -   Lavada Stank M.D on 03/30/2024 at 9:49 AM   -  To page go to [email or web address]   "

## 2024-03-30 NOTE — Plan of Care (Signed)

## 2024-03-30 NOTE — Progress Notes (Signed)
 PCCM Brief Note  Courtesy visit.  Pt follows with Dr. [name] for ILD/IPF, last seen October. Admitted now with parinfluenza and possible CAP.  Doing well, feels ok. A bit hypoxic and on 4L but otherwise alright. He requested that we be notified of his admission.  Continue care per [facility], appreciate the assistance. Will ask our office to arrange outpatient follow up in the next 10-14 days. Please call back if we can be of any further assistance while pt is hospitalized.   Sammi Gore, PA - [facility] For pager details, please see AMION or use Epic chat  After 1900, please call [facility] for cross coverage needs 03/30/2024, 11:08 AM

## 2024-03-30 NOTE — Progress Notes (Signed)
 Physical Therapy Treatment Patient Details Name: Peter Greene MRN: [id number] DOB: 1940-11-24 Today's Date: 03/30/2024   History of Present Illness 83 y.o male presents to [facility] on 12/29 for pneumonia, respiratory failure, and hypoxia. Does not wear oxygen at home. CT (-) for PE. PMH: HLD, a-fib, MDS s/p stem cell transplant, ILD w/ pulmonary fibrosis, prostate CA.    PT Comments  Pt received in the recliner and agreeable to session. Pt able to tolerate increased gait distance this session with CGA for safety. Pt demonstrates slight instability, but no LOB without UE support. Pt requires increase to 6L O2 and cues for pursed lip breathing during ambulation to maintain >88%. Pt denies significant DOE or fatigue. Pt continues to benefit from PT services to progress toward functional mobility goals.    If plan is discharge home, recommend the following: A little help with walking and/or transfers;A little help with bathing/dressing/bathroom;Assistance with cooking/housework;Assist for transportation;Help with stairs or ramp for entrance   Can travel by private vehicle        Equipment Recommendations  None recommended by PT (Pt has appropriate DME at home)    Recommendations for Other Services       Precautions / Restrictions Precautions Precautions: Fall Recall of Precautions/Restrictions: Intact Precaution/Restrictions Comments: watch SpO2 (currenlty on 4L)     Mobility  Bed Mobility Overal bed mobility: Modified Independent             General bed mobility comments: seated in recliner upon arrival and no physical assist to return to supine    Transfers Overall transfer level: Needs assistance Equipment used: None Transfers: Sit to/from Stand Sit to Stand: Supervision           General transfer comment: supervision for safety    Ambulation/Gait Ambulation/Gait assistance: Contact guard assist Gait Distance (Feet): 250 Feet Assistive device: None Gait  Pattern/deviations: Step-through pattern, Decreased step length - right, Decreased step length - left, Narrow base of support, Decreased stride length       General Gait Details: slightly unsteady without UE support, but no LOB. CGA for safety. pt denies DOE   Building Services Engineer Rankin (Stroke Patients Only)       Balance Overall balance assessment: Needs assistance Sitting-balance support: No upper extremity supported, Feet supported Sitting balance-Leahy Scale: Good     Standing balance support: During functional activity, No upper extremity supported Standing balance-Leahy Scale: Fair Standing balance comment: slightly unsteady, but no LOB without AD                            Communication Communication Communication: No apparent difficulties  Cognition Arousal: Alert Behavior During Therapy: WFL for tasks assessed/performed   PT - Cognitive impairments: No apparent impairments                         Following commands: Intact      Cueing Cueing Techniques: Verbal cues, Tactile cues, Visual cues  Exercises      General Comments General comments (skin integrity, edema, etc.): SpO2 87-89% on 6L during ambulation. Improves to 90% at end of session back on 4L with rest      Pertinent Vitals/Pain Pain Assessment Pain Assessment: No/denies pain     PT Goals (current goals can now be found in the care  plan section) Acute Rehab PT Goals Patient Stated Goal: No additional goals stated. Progress towards PT goals: Progressing toward goals    Frequency    Min 2X/week       AM-PAC PT 6 Clicks Mobility   Outcome Measure  Help needed turning from your back to your side while in a flat bed without using bedrails?: None Help needed moving from lying on your back to sitting on the side of a flat bed without using bedrails?: None Help needed moving to and from a bed to a chair  (including a wheelchair)?: None Help needed standing up from a chair using your arms (e.g., wheelchair or bedside chair)?: None Help needed to walk in hospital room?: A Little Help needed climbing 3-5 steps with a railing? : A Little 6 Click Score: 22    End of Session Equipment Utilized During Treatment: Oxygen;Gait belt Activity Tolerance: Patient tolerated treatment well Patient left: in bed;with call bell/phone within reach;with bed alarm set Nurse Communication: Mobility status PT Visit Diagnosis: Unsteadiness on feet (R26.81);Muscle weakness (generalized) (M62.81)     Time: 8592-8566 PT Time Calculation (min) (ACUTE ONLY): 26 min  Charges:    $Gait Training: 8-22 mins $Therapeutic Activity: 8-22 mins PT General Charges $$ ACUTE PT VISIT: 1 Visit                    Darryle George, PTA Acute Rehabilitation Services Secure Chat Preferred  [phone]    Darryle George 03/30/2024, 3:44 PM

## 2024-03-30 NOTE — TOC Progression Note (Signed)
 Transition of Care [facility]) - Progression Note    Patient Details  Name: Peter Greene MRN: [id number] Date of Birth: Aug 03, 1940  [facility] CM/SW Contact  Marval Gell, RN Phone Number: 03/30/2024, 11:28 AM  Clinical Narrative:     Spoke with wife to offer to set up [facility] services, she declined for now stating she needs to discuss it further with the patient. ICM will need to follow up with patient/ wife.   Expected Discharge Plan:  (TBD) Barriers to Discharge: Continued Medical Work up               Expected Discharge Plan and Services       Living arrangements for the past 2 months: Single Family Home                                       Social Drivers of Health (SDOH) Interventions SDOH Screenings   Food Insecurity: No Food Insecurity (03/28/2024)  Housing: Low Risk (03/28/2024)  Transportation Needs: No Transportation Needs (03/28/2024)  Utilities: Not At Risk (03/28/2024)  Alcohol  Screen: Low Risk (02/05/2024)  Depression (PHQ2-9): Low Risk (02/05/2024)  Social Connections: Socially Integrated (03/28/2024)  Tobacco Use: Medium Risk (03/28/2024)    Readmission Risk Interventions    01/31/2022   10:29 AM  Readmission Risk Prevention Plan  Transportation Screening Complete  Home Care Screening Complete  Medication Review (RN CM) Complete

## 2024-03-31 ENCOUNTER — Inpatient Hospital Stay (HOSPITAL_COMMUNITY)

## 2024-03-31 DIAGNOSIS — J9601 Acute respiratory failure with hypoxia: Secondary | ICD-10-CM | POA: Diagnosis not present

## 2024-03-31 LAB — CBC WITH DIFFERENTIAL/PLATELET
Abs Immature Granulocytes: 0.03 K/uL (ref 0.00–0.07)
Basophils Absolute: 0 K/uL (ref 0.0–0.1)
Basophils Relative: 0 %
Eosinophils Absolute: 0 K/uL (ref 0.0–0.5)
Eosinophils Relative: 0 %
HCT: 30.3 % — ABNORMAL LOW (ref 39.0–52.0)
Hemoglobin: 10 g/dL — ABNORMAL LOW (ref 13.0–17.0)
Immature Granulocytes: 0 %
Lymphocytes Relative: 15 %
Lymphs Abs: 1 K/uL (ref 0.7–4.0)
MCH: 30.5 pg (ref 26.0–34.0)
MCHC: 33 g/dL (ref 30.0–36.0)
MCV: 92.4 fL (ref 80.0–100.0)
Monocytes Absolute: 0.6 K/uL (ref 0.1–1.0)
Monocytes Relative: 8 %
Neutro Abs: 5.2 K/uL (ref 1.7–7.7)
Neutrophils Relative %: 77 %
Platelets: 220 K/uL (ref 150–400)
RBC: 3.28 MIL/uL — ABNORMAL LOW (ref 4.22–5.81)
RDW: 15.9 % — ABNORMAL HIGH (ref 11.5–15.5)
WBC: 6.9 K/uL (ref 4.0–10.5)
nRBC: 0 % (ref 0.0–0.2)

## 2024-03-31 LAB — BASIC METABOLIC PANEL WITH GFR
Anion gap: 11 (ref 5–15)
BUN: 31 mg/dL — ABNORMAL HIGH (ref 8–23)
CO2: 21 mmol/L — ABNORMAL LOW (ref 22–32)
Calcium: 8.5 mg/dL — ABNORMAL LOW (ref 8.9–10.3)
Chloride: 106 mmol/L (ref 98–111)
Creatinine, Ser: 0.93 mg/dL (ref 0.61–1.24)
GFR, Estimated: >60 mL/min
Glucose, Bld: 114 mg/dL — ABNORMAL HIGH (ref 70–99)
Potassium: 4.6 mmol/L (ref 3.5–5.1)
Sodium: 138 mmol/L (ref 135–145)

## 2024-03-31 LAB — C-REACTIVE PROTEIN: CRP: 2.7 mg/dL — ABNORMAL HIGH

## 2024-03-31 LAB — PROCALCITONIN: Procalcitonin: 0.11 ng/mL

## 2024-03-31 LAB — PRO BRAIN NATRIURETIC PEPTIDE: Pro Brain Natriuretic Peptide: 1236 pg/mL — ABNORMAL HIGH

## 2024-03-31 LAB — MAGNESIUM: Magnesium: 2.3 mg/dL (ref 1.7–2.4)

## 2024-03-31 NOTE — Progress Notes (Signed)
" °   03/30/24 2300  BiPAP/CPAP/SIPAP  Reason BIPAP/CPAP not in use Non-compliant (Pt. refused. Pt. educated on why to wear machine.)    "

## 2024-03-31 NOTE — Plan of Care (Signed)

## 2024-03-31 NOTE — Progress Notes (Signed)
 "                                                                                                                                                                                                                                                                                PROGRESS NOTE     Patient Demographics:    Peter Greene, is a 84 y.o. male, DOB - 04-04-1940, FMW:[id number]  Outpatient Primary MD for the patient is [name], MD    LOS - 3  Admit date - 03/28/2024    Chief Complaint  Patient presents with   Pneumonia        Shortness of Breath       Brief Narrative (HPI from H&P)   84 y.o. 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  He is accompanied by his wife, [name].   He has been experiencing shortness of breath, lightheadedness, and a cough for the past two days, along with a low-grade fever reaching 100.6F, which lasted for about a day. These symptoms are similar to previous episodes of pneumonia he has experienced.   He has a history of pulmonary nodules, which were suspected to be cancerous, although biopsies did not confirm this. He underwent radiation treatment to the chest for these nodules.   He started taking levofloxacin for three days ago for his symptoms, but it did not alleviate them as it usually does.  At his primary care provider office today he was seen and noted to be hypoxic with O2 saturations at 86% on room air.  Typically patient is not on oxygen at baseline.  Patient had been given a shot of Rocephin  as well as Solu-Medrol while in the office.   Subjective:   Patient in bed, appears comfortable, denies any headache, no fever, no chest pain or pressure, improving shortness of breath , no abdominal pain. No new focal weakness.     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 Patient with underlying history of ILD, does not use oxygen, lung cancer, chronically follows with [facility] Dr. Forrestine, took outpatient Levaquin course without any benefit, admitted to the hospital yesterday for possible community-acquired pneumonia, started on empiric antibiotics along with a trial of Lasix  as he has developed some crackles overnight, encouraged to sit in chair use I-S and flutter valve.  Advance activity and titrate down oxygen.  He is still a little hypoxic will add IV steroids, taper down empiric antibiotics as procalcitonin stable follow cultures, stable echocardiogram, plan discussed with pulmonary on 03/30/2025 they agree with the management no changes.  They will follow-up postdischarge in the office in about 10 to 14 days.   Hyponatremia Acute.  Sodium noted to be 127.  Due to dehydration.  Resolved.   Interstitial lung disease  Patient with a history of interstitial lung disease with pulmonary fibrosis as well as postradiation fibrosis followed by Dr. [name].  Patient had been given steroids at his primary care office prior to arrival.  Continue supportive care and monitor.   Paroxysmal/chronic atrial fibrillation .  Patient appears to be in atrial fibrillation currently rate controlled.  Continue Eliquis , low-dose Lopressor .  Advised to score greater than 4.   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   Hyperlipidemia  - Continue atorvastatin    GERD  - Continue Protonix     Lung malignancy  Patient with a prior history of positive PET scans, but negative biopsies for which he underwent SBRT.  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  Continue outpatient follow-up with Dr [name]     Prostate cancer  - Continue relugolix.  Patient's wife to bring home supply      Condition - Guarded  Family Communication  : Wife [name] [phone] on 03/29/2024 at 9:31 AM  Code Status :  Full  Consults  :  None  PUD Prophylaxis : PPI   Procedures  :     TTE - 1. Left ventricular ejection fraction, by estimation, is 55 to 60%. The left ventricle has normal function. The left ventricle has no regional wall motion abnormalities. There is mild concentric left ventricular hypertrophy. Left ventricular diastolic parameters are consistent with Grade II diastolic dysfunction (pseudonormalization).  2. Right ventricular systolic function is normal. The right ventricular size is normal.  3. Left atrial size was moderately dilated.  4. The mitral valve is normal in structure. No evidence of mitral valve regurgitation. No evidence of mitral stenosis.  5. The aortic valve was not well visualized. There is moderate calcification of the aortic valve. Aortic valve regurgitation is not visualized. No aortic stenosis is present.  6. Aortic dilatation noted. There is borderline dilatation of the aortic root, measuring 38 mm.  7. The inferior vena cava is normal in size with greater than 50% respiratory variability, suggesting right atrial pressure of 3 mmHg.  8. Technically difficult study with poor acoustic windows.  CT - 1. No pulmonary embolism. 2. Interval increase of an approximately 6 cm right perihilar peribronchovascular consolidation/mass extending into the right upper and right lower lobes, with associated central 4 mm metallic density. 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 3. Persistent bilateral patchy reticulations with superimposed ground-glass opacities. 4. Persistent trace right pleural effusion. 5. Aortic valve leaflet calcification and at least  3 vessel coronary artery calcifications.       Disposition Plan  :    Status is: Inpatient  DVT Prophylaxis  :    apixaban  (ELIQUIS ) tablet 5 mg   Lab Results  Component Value Date   PLT 220 03/31/2024    Diet :  Diet Order             Diet Heart Room service appropriate? Yes; Fluid consistency: Thin  Diet effective now                    Inpatient Medications  Scheduled Meds:  apixaban   5 mg Oral BID   aspirin  EC  81 mg Oral q AM   atorvastatin   40 mg Oral q AM   fluticasone   1 spray Each Nare Daily   guaiFENesin   600 mg Oral BID   levothyroxine   50 mcg Oral Q0600   methylPREDNISolone (SOLU-MEDROL) injection  60 mg Intravenous Q24H   metoprolol  tartrate  25 mg Oral BID   omega-3 acid ethyl esters  1 g Oral BID   pantoprazole   20 mg Oral QPM   relugolix  120 mg Oral Daily   sodium chloride  flush  3 mL Intravenous Q12H   tamsulosin   0.4 mg Oral q AM   valACYclovir   500 mg Oral q AM   Continuous Infusions:  azithromycin  500 mg (03/30/24 1258)   PRN Meds:.acetaminophen  **OR** acetaminophen , albuterol , menthol    Objective:   Vitals:   03/31/24 0559 03/31/24 0743 03/31/24 0744 03/31/24 0800  BP:  (!) 143/89 (!) 143/89 (!) 143/89  Pulse: (!) 51 (!) 58 62 82  Resp: 14 16 17    Temp:  98.2 F (36.8 C)    TempSrc:  Oral    SpO2: 97% (!) 87% (!) 88%   Weight:      Height:        Wt Readings from Last 3 Encounters:  03/28/24 92 kg  02/05/24 91.7 kg  01/06/24 89.8 kg     Intake/Output Summary (Last 24 hours) at 03/31/2024 1005 Last data filed at 03/31/2024 0734 Gross per 24 hour  Intake 500 ml  Output 2150 ml  Net -1650 ml     Physical Exam  Awake Alert, No new F.N deficits, Normal affect [place].AT,PERRAL Supple Neck, No JVD,   Symmetrical Chest wall movement, Good air movement bilaterally, +ve rales RRR,No Gallops,Rubs or new Murmurs,  +ve B.Sounds, Abd Soft, No tenderness,   No Cyanosis, Clubbing or edema        Data Review:    Recent Labs  Lab 03/28/24 1354 03/29/24 0414  03/30/24 0401 03/31/24 0358  WBC 7.6 5.1 8.5 6.9  HGB 11.2* 12.3* 10.1* 10.0*  HCT 33.6* 37.6* 30.6* 30.3*  PLT 239 227 213 220  MCV 91.8 92.6 91.3 92.4  MCH 30.6 30.3 30.1 30.5  MCHC 33.3 32.7 33.0 33.0  RDW 15.5 15.7* 15.8* 15.9*  LYMPHSABS 1.8  --  1.3 1.0  MONOABS 0.7  --  0.5 0.6  EOSABS 0.2  --  0.0 0.0  BASOSABS 0.0  --  0.0 0.0    Recent Labs  Lab 03/28/24 1347 03/28/24 1354 03/28/24 1759 03/29/24 0414 03/29/24 0618 03/30/24 0401 03/31/24 0358  NA  --  127*  --  139  --  139 138  K  --  4.2  --  4.5  --  4.1 4.6  CL  --  94*  --  105  --  105 106  CO2  --  21*  --  22  --  23 21*  ANIONGAP  --  13  --  11  --  11 11  GLUCOSE  --  116*  --  146*  --  103* 114*  BUN  --  32*  --  27*  --  35* 31*  CREATININE  --  1.51*  --  1.21  --  1.00 0.93  AST  --  39  --   --   --   --   --   ALT  --  21  --   --   --   --   --   ALKPHOS  --  71  --   --   --   --   --   BILITOT  --  0.6  --   --   --   --   --   ALBUMIN  --  3.9  --   --   --   --   --   CRP  --   --   --   --  10.1* 4.6* 2.7*  PROCALCITON  --   --  0.41  --  0.39 0.28 0.11  LATICACIDVEN 1.6  --   --   --   --   --   --   INR  --  1.6*  --   --   --   --   --   MG  --   --   --   --   --  2.2 2.3  CALCIUM   --  8.8*  --  9.1  --  8.6* 8.5*      Recent Labs  Lab 03/28/24 1347 03/28/24 1354 03/28/24 1759 03/29/24 0414 03/29/24 0618 03/30/24 0401 03/31/24 0358  CRP  --   --   --   --  10.1* 4.6* 2.7*  PROCALCITON  --   --  0.41  --  0.39 0.28 0.11  LATICACIDVEN 1.6  --   --   --   --   --   --   INR  --  1.6*  --   --   --   --   --   MG  --   --   --   --   --  2.2 2.3  CALCIUM   --  8.8*  --  9.1  --  8.6* 8.5*    ProBNP (last 3 results) Recent Labs    03/28/24 1759 03/29/24 0618  PROBNP 737.0* 498.0*    --------------------------------------------------------------------------------------------------------------- Lab Results  Component Value Date   CHOL 112 01/31/2022   HDL 30 (L)  01/31/2022   LDLCALC 71 01/31/2022   TRIG 54 01/31/2022   CHOLHDL 3.7 01/31/2022    No results found for: HGBA1C No results for input(s): TSH, T4TOTAL, FREET4, T3FREE, THYROIDAB in the last 72 hours. No results for input(s): VITAMINB12, FOLATE, FERRITIN, TIBC, IRON, RETICCTPCT in the last 72 hours. ------------------------------------------------------------------------------------------------------------------ Cardiac Enzymes No results for input(s): CKMB, TROPONINI, MYOGLOBIN in the last 168 hours.  Invalid input(s): CK  Micro Results Recent Results (from the past 240 hours)  Resp panel by RT-PCR (RSV, Flu A&B, Covid) Anterior Nasal Swab     Status: None   Collection Time: 03/28/24  1:31 PM   Specimen: Anterior Nasal Swab  Result Value Ref Range Status   SARS Coronavirus 2 by RT PCR NEGATIVE NEGATIVE Final   Influenza A by PCR NEGATIVE NEGATIVE Final  Influenza B by PCR NEGATIVE NEGATIVE Final    Comment: (NOTE) The Xpert Xpress SARS-CoV-2/FLU/RSV plus assay is intended as an aid in the diagnosis of influenza from Nasopharyngeal swab specimens and should not be used as a sole basis for treatment. Nasal washings and aspirates are unacceptable for Xpert Xpress SARS-CoV-2/FLU/RSV testing.  Fact Sheet for Patients: bloggercourse.com  Fact Sheet for Healthcare Providers: seriousbroker.it  This test is not yet approved or cleared by the [place]  FDA and has been authorized for detection and/or diagnosis of SARS-CoV-2 by FDA under an Emergency Use Authorization (EUA). This EUA will remain in effect (meaning this test can be used) for the duration of the COVID-19 declaration under Section 564(b)(1) of the Act, 21 U.S.C. section 360bbb-3(b)(1), unless the authorization is terminated or revoked.     Resp Syncytial Virus by PCR NEGATIVE NEGATIVE Final    Comment: (NOTE) Fact Sheet for  Patients: bloggercourse.com  Fact Sheet for Healthcare Providers: seriousbroker.it  This test is not yet approved or cleared by the [place]  FDA and has been authorized for detection and/or diagnosis of SARS-CoV-2 by FDA under an Emergency Use Authorization (EUA). This EUA will remain in effect (meaning this test can be used) for the duration of the COVID-19 declaration under Section 564(b)(1) of the Act, 21 U.S.C. section 360bbb-3(b)(1), unless the authorization is terminated or revoked.  Performed at [facility], [address], [place], [place] [address]   Culture, blood (Routine X 2) w Reflex to ID Panel     Status: None (Preliminary result)   Collection Time: 03/28/24  1:31 PM   Specimen: BLOOD  Result Value Ref Range Status   Specimen Description BLOOD SITE NOT SPECIFIED  Final   Special Requests   Final    BOTTLES DRAWN AEROBIC AND ANAEROBIC Blood Culture adequate volume   Culture   Final    NO GROWTH 3 DAYS Performed at [facility], [address], [place], [place] [address]    Report Status PENDING  Incomplete  Blood Culture (routine x 2)     Status: None (Preliminary result)   Collection Time: 03/28/24  1:36 PM   Specimen: BLOOD  Result Value Ref Range Status   Specimen Description BLOOD SITE NOT SPECIFIED  Final   Special Requests   Final    BOTTLES DRAWN AEROBIC AND ANAEROBIC Blood Culture results may not be optimal due to an inadequate volume of blood received in culture bottles   Culture   Final    NO GROWTH 3 DAYS Performed at [facility], [address], [place], [place] [address]    Report Status PENDING  Incomplete  Respiratory (~20 pathogens) panel by PCR     Status: Abnormal   Collection Time: 03/28/24  5:09 PM   Specimen: Nasopharyngeal Swab; Respiratory  Result Value Ref Range Status   Adenovirus NOT DETECTED NOT DETECTED Final   Coronavirus 229E NOT DETECTED NOT DETECTED  Final    Comment: (NOTE) The Coronavirus on the Respiratory Panel, DOES NOT test for the novel  Coronavirus (2019 nCoV)    Coronavirus HKU1 NOT DETECTED NOT DETECTED Final   Coronavirus NL63 NOT DETECTED NOT DETECTED Final   Coronavirus OC43 NOT DETECTED NOT DETECTED Final   Metapneumovirus NOT DETECTED NOT DETECTED Final   Rhinovirus / Enterovirus NOT DETECTED NOT DETECTED Final   Influenza A NOT DETECTED NOT DETECTED Final   Influenza B NOT DETECTED NOT DETECTED Final   Parainfluenza Virus 1 DETECTED (A)  NOT DETECTED Final   Parainfluenza Virus 2 NOT DETECTED NOT DETECTED Final   Parainfluenza Virus 3 NOT DETECTED NOT DETECTED Final   Parainfluenza Virus 4 NOT DETECTED NOT DETECTED Final   Respiratory Syncytial Virus NOT DETECTED NOT DETECTED Final   Bordetella pertussis NOT DETECTED NOT DETECTED Final   Bordetella Parapertussis NOT DETECTED NOT DETECTED Final   Chlamydophila pneumoniae NOT DETECTED NOT DETECTED Final   Mycoplasma pneumoniae NOT DETECTED NOT DETECTED Final    Comment: Performed at [facility], [address] [place], [place] [address]    Radiology Report ECHOCARDIOGRAM COMPLETE Result Date: 03/29/2024    ECHOCARDIOGRAM REPORT   Patient Name:   TANYA MARVIN Date of Exam: 03/29/2024 Medical Rec #:  [id number]       Height:       68.0 in Accession #:    [id number]      Weight:       202.8 lb Date of Birth:  10/02/1940      BSA:          2.056 m Patient Age:    83 years        BP:           128/76 mmHg Patient Gender: M               HR:           68 bpm. Exam Location:  Inpatient Procedure: 2D Echo, Cardiac Doppler, Color Doppler and Intracardiac            Opacification Agent (Both Spectral and Color Flow Doppler were            utilized during procedure). Indications:     CHF Acute Diastolic I50.31  History:         Patient has prior history of Echocardiogram examinations, most                  recent 08/12/2021. Arrythmias:Atrial Fibrillation; Risk                   Factors:Hypertension.  Sonographer:     Tinnie Gosling RDCS Referring Phys:  [id number] LAVADA POUR [facility] Diagnosing Phys: Ezra Kanner IMPRESSIONS  1. Left ventricular ejection fraction, by estimation, is 55 to 60%. The left ventricle has normal function. The left ventricle has no regional wall motion abnormalities. There is mild concentric left ventricular hypertrophy. Left ventricular diastolic parameters are consistent with Grade II diastolic dysfunction (pseudonormalization).  2. Right ventricular systolic function is normal. The right ventricular size is normal.  3. Left atrial size was moderately dilated.  4. The mitral valve is normal in structure. No evidence of mitral valve regurgitation. No evidence of mitral stenosis.  5. The aortic valve was not well visualized. There is moderate calcification of the aortic valve. Aortic valve regurgitation is not visualized. No aortic stenosis is present.  6. Aortic dilatation noted. There is borderline dilatation of the aortic root, measuring 38 mm.  7. The inferior vena cava is normal in size with greater than 50% respiratory variability, suggesting right atrial pressure of 3 mmHg.  8. Technically difficult study with poor acoustic windows. FINDINGS  Left Ventricle: Left ventricular ejection fraction, by estimation, is 55 to 60%. The left ventricle has normal function. The left ventricle has no regional wall motion abnormalities. The left ventricular internal cavity size was normal in size. There is  mild concentric left ventricular hypertrophy. Left ventricular diastolic parameters are consistent with Grade II diastolic dysfunction (  pseudonormalization). Right Ventricle: The right ventricular size is normal. No increase in right ventricular wall thickness. Right ventricular systolic function is normal. Left Atrium: Left atrial size was moderately dilated. Right Atrium: Right atrial size was normal in size. Pericardium: There is no evidence of pericardial effusion.  Mitral Valve: The mitral valve is normal in structure. No evidence of mitral valve regurgitation. No evidence of mitral valve stenosis. Tricuspid Valve: The tricuspid valve is not well visualized. Tricuspid valve regurgitation is not demonstrated. Aortic Valve: The aortic valve was not well visualized. There is moderate calcification of the aortic valve. Aortic valve regurgitation is not visualized. No aortic stenosis is present. Aortic valve mean gradient measures 3.0 mmHg. Aortic valve peak gradient measures 5.5 mmHg. Aortic valve area, by VTI measures 1.60 cm. Pulmonic Valve: The pulmonic valve was normal in structure. Pulmonic valve regurgitation is not visualized. Aorta: Aortic dilatation noted. There is borderline dilatation of the aortic root, measuring 38 mm. Venous: The inferior vena cava is normal in size with greater than 50% respiratory variability, suggesting right atrial pressure of 3 mmHg. IAS/Shunts: No atrial level shunt detected by color flow Doppler.  LEFT VENTRICLE PLAX 2D LVIDd:         4.70 cm   Diastology LVIDs:         3.50 cm   LV e' medial:    5.77 cm/s LV PW:         1.00 cm   LV E/e' medial:  10.2 LV IVS:        1.10 cm   LV e' lateral:   8.59 cm/s LVOT diam:     2.10 cm   LV E/e' lateral: 6.9 LV SV:         41 LV SV Index:   20 LVOT Area:     3.46 cm  RIGHT VENTRICLE RV S prime:     12.70 cm/s  PULMONARY VEINS TAPSE (M-mode): 2.0 cm      Diastolic Velocity: 24.00 cm/s                             S/D Velocity:       0.70                             Systolic Velocity:  16.60 cm/s LEFT ATRIUM           Index        RIGHT ATRIUM           Index LA diam:      4.30 cm 2.09 cm/m   RA Area:     15.40 cm LA Vol (A4C): 85.7 ml 41.68 ml/m  RA Volume:   34.10 ml  16.59 ml/m  AORTIC VALVE AV Area (Vmax):    1.70 cm AV Area (Vmean):   1.51 cm AV Area (VTI):     1.60 cm AV Vmax:           117.00 cm/s AV Vmean:          82.100 cm/s AV VTI:            0.254 m AV Peak Grad:      5.5 mmHg AV Mean  Grad:      3.0 mmHg LVOT Vmax:         57.40 cm/s LVOT Vmean:        35.800 cm/s LVOT VTI:  0.117 m LVOT/AV VTI ratio: 0.46  AORTA Ao Root diam: 3.80 cm Ao Asc diam:  3.50 cm MITRAL VALVE MV Area (PHT): 3.40 cm    SHUNTS MV Decel Time: 223 msec    Systemic VTI:  0.12 m MV E velocity: 59.10 cm/s  Systemic Diam: 2.10 cm MV A velocity: 54.80 cm/s MV E/A ratio:  1.08 Dalton McleanMD Electronically signed by Ezra Kanner Signature Date/Time: 03/29/2024/4:06:23 PM    Final (Updated)      Signature  -   Lavada Stank M.D on 03/31/2024 at 10:05 AM   -  To page go to [email or web address]   "

## 2024-03-31 NOTE — Plan of Care (Signed)

## 2024-03-31 NOTE — Care Management (Signed)
 Transition of Care [facility]) - Inpatient Brief Assessment   Patient Details  Name: Laker Thompson MRN: [id number] Date of Birth: [birth year]  Transition of Care [facility] CM/SW Contact:    Corean JAYSON Canary, RN Phone Number: 03/31/2024, 11:59 AM   Clinical Narrative: Presents from MD office with [facility], Hypoxia, pneumonia  Currently needing 6 L of oxygen to ambulate with PT, not on oxygen at home normally. Started taking Levaquin, now on IV abx PT recommended  HH, sent out in HUB  Will follow up with choices for [facility], and monitor oxygen need  IPCM will follow   Transition of Care Asessment: Insurance and Status: Insurance coverage has been reviewed Patient has primary care physician: Yes Home environment has been reviewed: Residennce with spouse   Prior/Current Home Services: No current home services Social Drivers of Health Review: SDOH reviewed no interventions necessary Readmission risk has been reviewed: Yes Transition of care needs: transition of care needs identified, TOC will continue to follow

## 2024-04-01 DIAGNOSIS — J9601 Acute respiratory failure with hypoxia: Secondary | ICD-10-CM | POA: Diagnosis not present

## 2024-04-01 DIAGNOSIS — I5030 Unspecified diastolic (congestive) heart failure: Secondary | ICD-10-CM

## 2024-04-01 DIAGNOSIS — J849 Interstitial pulmonary disease, unspecified: Secondary | ICD-10-CM | POA: Diagnosis not present

## 2024-04-01 LAB — MAGNESIUM: Magnesium: 2.5 mg/dL — ABNORMAL HIGH (ref 1.7–2.4)

## 2024-04-01 LAB — CBC WITH DIFFERENTIAL/PLATELET
Abs Immature Granulocytes: 0.06 K/uL (ref 0.00–0.07)
Basophils Absolute: 0 K/uL (ref 0.0–0.1)
Basophils Relative: 0 %
Eosinophils Absolute: 0 K/uL (ref 0.0–0.5)
Eosinophils Relative: 0 %
HCT: 31.7 % — ABNORMAL LOW (ref 39.0–52.0)
Hemoglobin: 10.3 g/dL — ABNORMAL LOW (ref 13.0–17.0)
Immature Granulocytes: 1 %
Lymphocytes Relative: 18 %
Lymphs Abs: 1.7 K/uL (ref 0.7–4.0)
MCH: 29.8 pg (ref 26.0–34.0)
MCHC: 32.5 g/dL (ref 30.0–36.0)
MCV: 91.6 fL (ref 80.0–100.0)
Monocytes Absolute: 0.7 K/uL (ref 0.1–1.0)
Monocytes Relative: 8 %
Neutro Abs: 7 K/uL (ref 1.7–7.7)
Neutrophils Relative %: 73 %
Platelets: 230 K/uL (ref 150–400)
RBC: 3.46 MIL/uL — ABNORMAL LOW (ref 4.22–5.81)
RDW: 15.4 % (ref 11.5–15.5)
WBC: 9.5 K/uL (ref 4.0–10.5)
nRBC: 0.2 % (ref 0.0–0.2)

## 2024-04-01 LAB — BASIC METABOLIC PANEL WITH GFR
Anion gap: 11 (ref 5–15)
BUN: 28 mg/dL — ABNORMAL HIGH (ref 8–23)
CO2: 23 mmol/L (ref 22–32)
Calcium: 8.7 mg/dL — ABNORMAL LOW (ref 8.9–10.3)
Chloride: 104 mmol/L (ref 98–111)
Creatinine, Ser: 0.78 mg/dL (ref 0.61–1.24)
GFR, Estimated: >60 mL/min
Glucose, Bld: 100 mg/dL — ABNORMAL HIGH (ref 70–99)
Potassium: 4.5 mmol/L (ref 3.5–5.1)
Sodium: 137 mmol/L (ref 135–145)

## 2024-04-01 LAB — C-REACTIVE PROTEIN: CRP: 1.3 mg/dL — ABNORMAL HIGH

## 2024-04-01 LAB — PRO BRAIN NATRIURETIC PEPTIDE: Pro Brain Natriuretic Peptide: 4018 pg/mL — ABNORMAL HIGH

## 2024-04-01 LAB — PROCALCITONIN: Procalcitonin: <0.1 ng/mL

## 2024-04-01 MED ORDER — LORAZEPAM 2 MG/ML IJ SOLN
1.0000 mg | Freq: Once | INTRAMUSCULAR | Status: AC
  Administered 2024-04-01: 1 mg via INTRAVENOUS
  Filled 2024-04-01: qty 1

## 2024-04-01 MED ORDER — SODIUM CHLORIDE 0.9 % IV SOLN: 2.0000 g | INTRAVENOUS | Status: DC

## 2024-04-01 MED ORDER — FUROSEMIDE 10 MG/ML IJ SOLN
40.0000 mg | Freq: Once | INTRAMUSCULAR | Status: AC
  Administered 2024-04-01: 40 mg via INTRAVENOUS
  Filled 2024-04-01: qty 4

## 2024-04-01 MED ORDER — LORAZEPAM 1 MG PO TABS
1.0000 mg | ORAL_TABLET | Freq: Once | ORAL | Status: AC
  Administered 2024-04-01: 1 mg via ORAL
  Filled 2024-04-01: qty 1

## 2024-04-01 MED ORDER — IPRATROPIUM-ALBUTEROL 0.5-2.5 (3) MG/3ML IN SOLN
3.0000 mL | Freq: Once | RESPIRATORY_TRACT | Status: AC
  Administered 2024-04-01: 3 mL via RESPIRATORY_TRACT
  Filled 2024-04-01: qty 3

## 2024-04-01 NOTE — Progress Notes (Signed)
 "                                                                                                                                                                                                                                                                                PROGRESS NOTE     Patient Demographics:    Peter Greene, is a 84 y.o. male, DOB - February 10, 1941, FMW:[id number]  Outpatient Primary MD for the patient is [name], MD    LOS - 4  Admit date - 03/28/2024    Chief Complaint  Patient presents with   Pneumonia        Shortness of Breath       Brief Narrative (HPI from H&P)   84 y.o. 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  He is accompanied by his wife, [name].   He has been experiencing shortness of breath, lightheadedness, and a cough for the past two days, along with a low-grade fever reaching 100.29F, which lasted for about a day. These symptoms are similar to previous episodes of pneumonia he has experienced.   He has a history of pulmonary nodules, which were suspected to be cancerous, although biopsies did not confirm this. He underwent radiation treatment to the chest for these nodules.   He started taking levofloxacin for three days ago for his symptoms, but it did not alleviate them as it usually does.  At his primary care provider office today he was seen and noted to be hypoxic with O2 saturations at 86% on room air.  Typically patient is not on oxygen at baseline.  Patient had been given a shot of Rocephin  as well as Solu-Medrol while in the office.   Subjective:   Patient in bed, appears comfortable, denies any headache, no fever, no chest pain or pressure, no shortness of breath , no abdominal pain. No new focal weakness.   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 Patient with underlying history of ILD, does not use oxygen, lung cancer, chronically follows with [place] pulmonary Dr. Forrestine, took outpatient Levaquin course without any benefit, admitted to the hospital yesterday for possible community-acquired pneumonia, started on empiric antibiotics along with a trial of Lasix  as he has developed some crackles overnight, encouraged to sit in chair use I-S and flutter valve.  Advance activity and titrate down oxygen.  He is still a little hypoxic will add IV steroids, taper down empiric antibiotics as procalcitonin stable follow cultures, stable echocardiogram, plan discussed with pulmonary on 03/30/2025 they agree with the management no changes.  They will follow-up postdischarge in the office in about 10 to 14 days.   SpO2: 94 % O2 Flow Rate (L/min): 3 L/min  Hyponatremia Acute.  Sodium noted to be 127.  Due to dehydration.  Resolved.   Interstitial lung disease  Patient with a history of interstitial lung disease with pulmonary fibrosis as well as postradiation fibrosis followed by Dr. [name].  Patient had been given steroids at his primary care office prior to arrival.  Continue supportive care and monitor.   Paroxysmal/chronic atrial fibrillation .  Patient appears to be in atrial fibrillation currently rate controlled.  Continue Eliquis , low-dose Lopressor .  Advised to score greater than 4.   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   Hyperlipidemia  - Continue atorvastatin    GERD  - Continue Protonix     Lung malignancy  Patient with a prior history of positive PET scans, but negative biopsies for which he underwent SBRT.  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  Continue outpatient follow-up with Dr [name]     Prostate cancer  - Continue relugolix.  Patient's wife to bring home supply      Condition - Guarded  Family Communication  : Wife [name] [phone] on 03/29/2024 at 9:31 AM  Code Status :  Full  Consults  :  None  PUD Prophylaxis : PPI   Procedures  :     TTE - 1. Left ventricular ejection fraction, by estimation, is 55 to 60%. The left ventricle has normal function. The left ventricle has no regional wall motion abnormalities. There is mild concentric left ventricular hypertrophy. Left ventricular diastolic parameters are consistent with Grade II diastolic dysfunction (pseudonormalization).  2. Right ventricular systolic function is normal. The right ventricular size is normal.  3. Left atrial size was moderately dilated.  4. The mitral valve is normal in structure. No evidence of mitral valve regurgitation. No evidence of mitral stenosis.  5. The aortic valve was not well visualized. There is moderate calcification of the aortic valve. Aortic valve regurgitation is not visualized. No aortic stenosis is present.  6. Aortic dilatation noted. There is borderline dilatation of the aortic root, measuring 38 mm.  7. The inferior vena cava is normal in size with greater than 50% respiratory variability, suggesting right atrial pressure of 3 mmHg.  8. Technically difficult study with poor acoustic windows.  CT - 1. No pulmonary embolism. 2. Interval increase of an approximately 6 cm right perihilar peribronchovascular consolidation/mass extending into the right upper and right lower lobes, with associated central 4 mm metallic density. 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 3. Persistent bilateral patchy reticulations with superimposed ground-glass opacities. 4. Persistent trace right pleural  effusion. 5. Aortic valve leaflet calcification and at least 3 vessel coronary  artery calcifications.      Disposition Plan  :    Status is: Inpatient  DVT Prophylaxis  :    apixaban  (ELIQUIS ) tablet 5 mg   Lab Results  Component Value Date   PLT 230 04/01/2024    Diet :  Diet Order             Diet Heart Room service appropriate? Yes; Fluid consistency: Thin  Diet effective now                    Inpatient Medications  Scheduled Meds:  apixaban   5 mg Oral BID   aspirin  EC  81 mg Oral q AM   atorvastatin   40 mg Oral q AM   fluticasone   1 spray Each Nare Daily   furosemide   40 mg Intravenous Once   guaiFENesin   600 mg Oral BID   levothyroxine   50 mcg Oral Q0600   methylPREDNISolone (SOLU-MEDROL) injection  60 mg Intravenous Q24H   metoprolol  tartrate  25 mg Oral BID   omega-3 acid ethyl esters  1 g Oral BID   pantoprazole   20 mg Oral QPM   relugolix  120 mg Oral Daily   sodium chloride  flush  3 mL Intravenous Q12H   tamsulosin   0.4 mg Oral q AM   valACYclovir   500 mg Oral q AM   Continuous Infusions:  azithromycin  500 mg (03/31/24 1158)   PRN Meds:.acetaminophen  **OR** acetaminophen , albuterol , menthol    Objective:   Vitals:   04/01/24 0230 04/01/24 0400 04/01/24 0826 04/01/24 0835  BP:  129/82 134/77 134/77  Pulse:  68 61 75  Resp:  (!) 26 16   Temp:  (!) 97.4 F (36.3 C) 97.6 F (36.4 C)   TempSrc:  Oral Oral   SpO2: (!) 85% 92% 94%   Weight:      Height:        Wt Readings from Last 3 Encounters:  03/28/24 92 kg  02/05/24 91.7 kg  01/06/24 89.8 kg     Intake/Output Summary (Last 24 hours) at 04/01/2024 0914 Last data filed at 04/01/2024 0458 Gross per 24 hour  Intake 253 ml  Output 1650 ml  Net -1397 ml     Physical Exam  Awake Alert, No new F.N deficits, Normal affect [place].AT,PERRAL Supple Neck, No JVD,   Symmetrical Chest wall movement, Good air movement bilaterally, +ve rales RRR,No Gallops,Rubs or new Murmurs,  +ve B.Sounds, Abd Soft, No tenderness,   No Cyanosis, Clubbing or edema        Data  Review:    Recent Labs  Lab 03/28/24 1354 03/29/24 0414 03/30/24 0401 03/31/24 0358 04/01/24 0435  WBC 7.6 5.1 8.5 6.9 9.5  HGB 11.2* 12.3* 10.1* 10.0* 10.3*  HCT 33.6* 37.6* 30.6* 30.3* 31.7*  PLT 239 227 213 220 230  MCV 91.8 92.6 91.3 92.4 91.6  MCH 30.6 30.3 30.1 30.5 29.8  MCHC 33.3 32.7 33.0 33.0 32.5  RDW 15.5 15.7* 15.8* 15.9* 15.4  LYMPHSABS 1.8  --  1.3 1.0 1.7  MONOABS 0.7  --  0.5 0.6 0.7  EOSABS 0.2  --  0.0 0.0 0.0  BASOSABS 0.0  --  0.0 0.0 0.0    Recent Labs  Lab 03/28/24 1347 03/28/24 1354 03/28/24 1759 03/29/24 0414 03/29/24 0618 03/30/24 0401 03/31/24 0358 04/01/24 0435  NA  --  127*  --  139  --  139  138 137  K  --  4.2  --  4.5  --  4.1 4.6 4.5  CL  --  94*  --  105  --  105 106 104  CO2  --  21*  --  22  --  23 21* 23  ANIONGAP  --  13  --  11  --  11 11 11   GLUCOSE  --  116*  --  146*  --  103* 114* 100*  BUN  --  32*  --  27*  --  35* 31* 28*  CREATININE  --  1.51*  --  1.21  --  1.00 0.93 0.78  AST  --  39  --   --   --   --   --   --   ALT  --  21  --   --   --   --   --   --   ALKPHOS  --  71  --   --   --   --   --   --   BILITOT  --  0.6  --   --   --   --   --   --   ALBUMIN  --  3.9  --   --   --   --   --   --   CRP  --   --   --   --  10.1* 4.6* 2.7* 1.3*  PROCALCITON  --   --  0.41  --  0.39 0.28 0.11 <0.10  LATICACIDVEN 1.6  --   --   --   --   --   --   --   INR  --  1.6*  --   --   --   --   --   --   MG  --   --   --   --   --  2.2 2.3 2.5*  CALCIUM   --  8.8*  --  9.1  --  8.6* 8.5* 8.7*      Recent Labs  Lab 03/28/24 1347 03/28/24 1354 03/28/24 1759 03/29/24 0414 03/29/24 0618 03/30/24 0401 03/31/24 0358 04/01/24 0435  CRP  --   --   --   --  10.1* 4.6* 2.7* 1.3*  PROCALCITON  --   --  0.41  --  0.39 0.28 0.11 <0.10  LATICACIDVEN 1.6  --   --   --   --   --   --   --   INR  --  1.6*  --   --   --   --   --   --   MG  --   --   --   --   --  2.2 2.3 2.5*  CALCIUM   --  8.8*  --  9.1  --  8.6* 8.5* 8.7*     ProBNP (last 3 results) Recent Labs    03/29/24 0618 03/31/24 0358 04/01/24 0435  PROBNP 498.0* 1,236.0* 4,018.0*    --------------------------------------------------------------------------------------------------------------- Lab Results  Component Value Date   CHOL 112 01/31/2022   HDL 30 (L) 01/31/2022   LDLCALC 71 01/31/2022   TRIG 54 01/31/2022   CHOLHDL 3.7 01/31/2022    No results found for: HGBA1C No results for input(s): TSH, T4TOTAL, FREET4, T3FREE, THYROIDAB in the last 72 hours. No results for input(s): VITAMINB12, FOLATE, FERRITIN, TIBC, IRON, RETICCTPCT in the last 72 hours. ------------------------------------------------------------------------------------------------------------------ Cardiac Enzymes No results for input(s): CKMB,  TROPONINI, MYOGLOBIN in the last 168 hours.  Invalid input(s): CK  Micro Results Recent Results (from the past 240 hours)  Resp panel by RT-PCR (RSV, Flu A&B, Covid) Anterior Nasal Swab     Status: None   Collection Time: 03/28/24  1:31 PM   Specimen: Anterior Nasal Swab  Result Value Ref Range Status   SARS Coronavirus 2 by RT PCR NEGATIVE NEGATIVE Final   Influenza A by PCR NEGATIVE NEGATIVE Final   Influenza B by PCR NEGATIVE NEGATIVE Final    Comment: (NOTE) The Xpert Xpress SARS-CoV-2/FLU/RSV plus assay is intended as an aid in the diagnosis of influenza from Nasopharyngeal swab specimens and should not be used as a sole basis for treatment. Nasal washings and aspirates are unacceptable for Xpert Xpress SARS-CoV-2/FLU/RSV testing.  Fact Sheet for Patients: bloggercourse.com  Fact Sheet for Healthcare Providers: seriousbroker.it  This test is not yet approved or cleared by the [place]  FDA and has been authorized for detection and/or diagnosis of SARS-CoV-2 by FDA under an Emergency Use Authorization (EUA). This EUA will  remain in effect (meaning this test can be used) for the duration of the COVID-19 declaration under Section 564(b)(1) of the Act, 21 U.S.C. section 360bbb-3(b)(1), unless the authorization is terminated or revoked.     Resp Syncytial Virus by PCR NEGATIVE NEGATIVE Final    Comment: (NOTE) Fact Sheet for Patients: bloggercourse.com  Fact Sheet for Healthcare Providers: seriousbroker.it  This test is not yet approved or cleared by the [place]  FDA and has been authorized for detection and/or diagnosis of SARS-CoV-2 by FDA under an Emergency Use Authorization (EUA). This EUA will remain in effect (meaning this test can be used) for the duration of the COVID-19 declaration under Section 564(b)(1) of the Act, 21 U.S.C. section 360bbb-3(b)(1), unless the authorization is terminated or revoked.  Performed at [facility], [address], [place], [place] [address]   Culture, blood (Routine X 2) w Reflex to ID Panel     Status: None (Preliminary result)   Collection Time: 03/28/24  1:31 PM   Specimen: BLOOD  Result Value Ref Range Status   Specimen Description BLOOD SITE NOT SPECIFIED  Final   Special Requests   Final    BOTTLES DRAWN AEROBIC AND ANAEROBIC Blood Culture adequate volume   Culture   Final    NO GROWTH 4 DAYS Performed at [facility], [address], [place], [place] [address]    Report Status PENDING  Incomplete  Blood Culture (routine x 2)     Status: None (Preliminary result)   Collection Time: 03/28/24  1:36 PM   Specimen: BLOOD  Result Value Ref Range Status   Specimen Description BLOOD SITE NOT SPECIFIED  Final   Special Requests   Final    BOTTLES DRAWN AEROBIC AND ANAEROBIC Blood Culture results may not be optimal due to an inadequate volume of blood received in culture bottles   Culture   Final    NO GROWTH 4 DAYS Performed at [facility], [address], [place], [place]  [address]    Report Status PENDING  Incomplete  Respiratory (~20 pathogens) panel by PCR     Status: Abnormal   Collection Time: 03/28/24  5:09 PM   Specimen: Nasopharyngeal Swab; Respiratory  Result Value Ref Range Status   Adenovirus NOT DETECTED NOT DETECTED Final   Coronavirus 229E NOT DETECTED NOT DETECTED Final    Comment: (NOTE) The Coronavirus on the Respiratory Panel,  DOES NOT test for the novel  Coronavirus (2019 nCoV)    Coronavirus HKU1 NOT DETECTED NOT DETECTED Final   Coronavirus NL63 NOT DETECTED NOT DETECTED Final   Coronavirus OC43 NOT DETECTED NOT DETECTED Final   Metapneumovirus NOT DETECTED NOT DETECTED Final   Rhinovirus / Enterovirus NOT DETECTED NOT DETECTED Final   Influenza A NOT DETECTED NOT DETECTED Final   Influenza B NOT DETECTED NOT DETECTED Final   Parainfluenza Virus 1 DETECTED (A) NOT DETECTED Final   Parainfluenza Virus 2 NOT DETECTED NOT DETECTED Final   Parainfluenza Virus 3 NOT DETECTED NOT DETECTED Final   Parainfluenza Virus 4 NOT DETECTED NOT DETECTED Final   Respiratory Syncytial Virus NOT DETECTED NOT DETECTED Final   Bordetella pertussis NOT DETECTED NOT DETECTED Final   Bordetella Parapertussis NOT DETECTED NOT DETECTED Final   Chlamydophila pneumoniae NOT DETECTED NOT DETECTED Final   Mycoplasma pneumoniae NOT DETECTED NOT DETECTED Final    Comment: Performed at [facility], [address] [place], [place] [address]    Radiology Report DG Chest Port 1 View Result Date: 03/31/2024 CLINICAL DATA:  Shortness of breath. EXAM: PORTABLE CHEST 1 VIEW COMPARISON:  Chest CT dated 03/28/2024. FINDINGS: There is diffuse chronic antral coarsening and right perihilar fibrotic changes. Faint areas of increased densities primarily in the left upper subpleural region appear new since the prior radiograph and may represent atypical infiltrate. No pleural effusion or pneumothorax. Stable cardiac silhouette. No acute osseous pathology. IMPRESSION: 1.  Chronic interstitial coarsening and right perihilar fibrotic changes. 2. Possible atypical infiltrate in the left upper lobe. Electronically Signed   By: Vanetta Chou M.D.   On: 03/31/2024 13:19     Signature  -   Lavada Stank M.D on 04/01/2024 at 9:14 AM   -  To page go to [email or web address]   "

## 2024-04-01 NOTE — Plan of Care (Signed)

## 2024-04-01 NOTE — TOC Progression Note (Signed)
 Transition of Care [facility]) - Progression Note    Patient Details  Name: Peter Greene MRN: [id number] Date of Birth: Nov 21, 1940  [facility] CM/SW Contact  Nola Devere Hands, RN Phone Number: 04/01/2024, 4:43 PM  Clinical Narrative:    Case Manager spoke with patient's wife concerning recommendation for Home Health therapy. CM explained patient has been accepted by several Home Health Agencies, [facility] has ben selected. ICM Team will continue to follow for oxygen needs. Mrs. Kusek has questions for which ever Vendor is chosen, they have questions, concerns regarding concentrator.    Expected Discharge Plan: Home w Home Health Services Barriers to Discharge: Continued Medical Work up               Expected Discharge Plan and Services   Discharge Planning Services: CM Consult Post Acute Care Choice: Home Health Living arrangements for the past 2 months: Single Family Home                           HH Arranged: PT HH Agency: [facility] Date HH Agency Contacted: 04/01/24 Time HH Agency Contacted: 1635 Representative spoke with at [facility] Agency: [place]   Social Drivers of Health (SDOH) Interventions SDOH Screenings   Food Insecurity: No Food Insecurity (03/28/2024)  Housing: Low Risk (03/28/2024)  Transportation Needs: No Transportation Needs (03/28/2024)  Utilities: Not At Risk (03/28/2024)  Alcohol  Screen: Low Risk (02/05/2024)  Depression (PHQ2-9): Low Risk (02/05/2024)  Social Connections: Socially Integrated (03/28/2024)  Tobacco Use: Medium Risk (03/28/2024)    Readmission Risk Interventions    01/31/2022   10:29 AM  Readmission Risk Prevention Plan  Transportation Screening Complete  Home Care Screening Complete  Medication Review (RN CM) Complete

## 2024-04-01 NOTE — Progress Notes (Signed)
 Physical Therapy Treatment Patient Details Name: Peter Greene MRN: [id number] DOB: 1941/03/07 Today's Date: 04/01/2024   History of Present Illness 84 y.o male presents to [facility] on 12/29 for pneumonia, respiratory failure, and hypoxia. Does not wear oxygen at home. CT (-) for PE. PMH: HLD, a-fib, MDS s/p stem cell transplant, ILD w/ pulmonary fibrosis, prostate CA.    PT Comments  Pt received in supine and agreeable to session. Most of session focused on O2 sat management due to pt desatting as low as 81% with all mobility and required 8L and increased time to recover. O2 probe changed during session and continued to read 81-89% throughout with stable pleth. Pt able to complete static standing marches and ambulate short distance to chair, but deferred further ambulation.  RN and MD notified. Pt continues to benefit from PT services to progress toward functional mobility goals.    If plan is discharge home, recommend the following: A little help with walking and/or transfers;A little help with bathing/dressing/bathroom;Assistance with cooking/housework;Assist for transportation;Help with stairs or ramp for entrance   Can travel by private vehicle        Equipment Recommendations  None recommended by PT (Pt has appropriate DME at home)    Recommendations for Other Services       Precautions / Restrictions Precautions Precautions: Fall Recall of Precautions/Restrictions: Intact Precaution/Restrictions Comments: watch SpO2 Restrictions Weight Bearing Restrictions Per Provider Order: No     Mobility  Bed Mobility Overal bed mobility: Modified Independent                  Transfers Overall transfer level: Needs assistance Equipment used: None Transfers: Sit to/from Stand Sit to Stand: Supervision                Ambulation/Gait Ambulation/Gait assistance: Contact guard assist, Min assist Gait Distance (Feet): 10 Feet Assistive device: None Gait Pattern/deviations:  Step-through pattern, Decreased stride length     Pre-gait activities: static standing marches General Gait Details: Pt demonstrates instability without UE support with x2 LOB requiring assist to correct   Stairs             Wheelchair Mobility     Tilt Bed    Modified Rankin (Stroke Patients Only)       Balance Overall balance assessment: Needs assistance Sitting-balance support: No upper extremity supported, Feet supported Sitting balance-Leahy Scale: Good Sitting balance - Comments: EOB   Standing balance support: During functional activity, No upper extremity supported Standing balance-Leahy Scale: Poor Standing balance comment: x2 LOB without UE support requiring assist to correct                            Communication Communication Communication: No apparent difficulties  Cognition Arousal: Alert Behavior During Therapy: WFL for tasks assessed/performed   PT - Cognitive impairments: No apparent impairments                         Following commands: Intact      Cueing Cueing Techniques: Verbal cues, Tactile cues, Visual cues  Exercises General Exercises - Lower Extremity Hip Flexion/Marching: AROM, Standing, Both, 10 reps    General Comments General comments (skin integrity, edema, etc.): SpO2 86% on 3.5L upon entry. Once sitting EOB, he desatted to 81%.O2 increased to 8L to improve to 89% with a lot of time to recover. He was able to do a few standing marches and walk to  the chair with SpO2 dropping to 83% again. He needed >83mins to recover back to 90% and pt left on 8L.      Pertinent Vitals/Pain Pain Assessment Pain Assessment: No/denies pain     PT Goals (current goals can now be found in the care plan section) Acute Rehab PT Goals Patient Stated Goal: No additional goals stated. Progress towards PT goals: Progressing toward goals    Frequency    Min 2X/week       AM-PAC PT 6 Clicks Mobility   Outcome  Measure  Help needed turning from your back to your side while in a flat bed without using bedrails?: None Help needed moving from lying on your back to sitting on the side of a flat bed without using bedrails?: None Help needed moving to and from a bed to a chair (including a wheelchair)?: A Little Help needed standing up from a chair using your arms (e.g., wheelchair or bedside chair)?: A Little Help needed to walk in hospital room?: A Little Help needed climbing 3-5 steps with a railing? : A Little 6 Click Score: 20    End of Session Equipment Utilized During Treatment: Oxygen;Gait belt Activity Tolerance: Other (comment) (limited by desat) Patient left: with call bell/phone within reach;in chair;with chair alarm set Nurse Communication: Mobility status PT Visit Diagnosis: Unsteadiness on feet (R26.81);Muscle weakness (generalized) (M62.81)     Time: 8965-8896 PT Time Calculation (min) (ACUTE ONLY): 29 min  Charges:    $Gait Training: 8-22 mins $Therapeutic Activity: 8-22 mins PT General Charges $$ ACUTE PT VISIT: 1 Visit                    Darryle George, PTA Acute Rehabilitation Services Secure Chat Preferred  [phone]    Darryle George 04/01/2024, 12:29 PM

## 2024-04-01 NOTE — Consult Note (Signed)
 "  NAME:  Peter Greene, MRN:  [id number], DOB:  Jun 17, 1940, LOS: 4 ADMISSION DATE:  03/28/2024 CHIEF COMPLAINT: Shortness of breath.  REFERRING MD : Dr. 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  GVHD manifested as rash and possibly also had lung involvement.  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  She is aware of the patient having an enlarging right lower lobe nodule with positive PET.  Underwent navigational bronchoscopy x 2 which was nondiagnostic.  Underwent SBRT in June 2024. He has had stable right perihilar mass on CT scan from 10/2023.  Previous CT scan done in the hospital showed increase in size of the perihilar consolidation/mass.  In addition there was some increased ground glass changes at the bases likely related to the underlying viral infection.  The patient continues to have similar changes with underlying fibrosis/ILD. He had echo which showed grade 2 diastolic dysfunction with dilated LA. Labs showing elevated BNP at 4K, CRP, procalcitonin both are improving.  He tested positive for parainfluenza virus.  Talking to the patient he says that at baseline he does not use any oxygen.  He is feeling better than before.  He has already received diuresis without much improvement which was initiated today.  Interim History / Subjective:  See above.  Significant Hospital Events: 04/01/2024: PCCM consulted.  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  has a past surgical history that includes Abdominal aortic aneurysm repair; Knee surgery (Bilateral); Hernia repair; Bone marrow transplant; Tonsillectomy (1946); Mohs surgery (2024); Endarterectomy (Left, 01/30/2022); Patch angioplasty (Left, 01/30/2022); Eye surgery; Cardiac catheterization; Bronchial needle aspiration biopsy (04/17/2022); Bronchial biopsy (04/17/2022); Bronchial washings (04/17/2022); Bronchial brushings (04/17/2022); Video bronchoscopy with radial endobronchial ultrasound (04/17/2022); Bronchial needle aspiration biopsy (06/19/2022); Bronchial biopsy (06/19/2022); Video bronchoscopy with radial endobronchial ultrasound (06/19/2022); Bronchial washings (06/19/2022); Fiducial marker placement (06/19/2022); and Prostate biopsy. Prior to Admission medications  Medication Sig Start Date End Date Taking? Authorizing Provider  aspirin  EC 81 MG tablet Take 81 mg by mouth in the morning. Swallow whole.   Yes [provider]  atorvastatin  (LIPITOR) 40 MG tablet Take 40 mg by mouth in the morning. 06/28/21  Yes [provider]  ELIQUIS  5 MG TABS tablet Take 5 mg by mouth 2 (two) times daily. 05/28/21  Yes [provider]  fluticasone  (FLONASE ) 50 MCG/ACT nasal spray Place 1 spray into both nostrils daily. 02/29/24  Yes [provider]  gabapentin  (NEURONTIN ) 100 MG capsule Take 100 mg by mouth as needed. 02/17/22  Yes [provider]  HYDROcodone  bit-homatropine (HYCODAN) 5-1.5 MG/5ML syrup Take 5 mLs by mouth every 6 (six) hours. 10/30/23  Yes [provider]  ipratropium (ATROVENT) 0.03 % nasal spray Place 2 sprays into both nostrils daily. 03/21/22  Yes [provider]  Anselm Oil 350 MG CAPS Take 350 mg by mouth in the morning and at bedtime.   Yes [provider]  levofloxacin (LEVAQUIN) 500 MG tablet Take 500 mg by mouth daily. 12/23/23  Yes [provider]  levothyroxine  (SYNTHROID ) 50  MCG tablet Take 50 mcg by mouth daily. 01/27/24  Yes [provider]  Melatonin 10 MG CAPS Take 10 mg by mouth as needed.   Yes [provider]  metoprolol  tartrate (LOPRESSOR ) 25 MG tablet Take 25 mg by mouth 2 (two) times daily. 07/14/21  Yes [provider]  Multiple Vitamin (MULTIVITAMIN WITH MINERALS) TABS tablet Take 1 tablet by mouth in the morning. Centrum   Yes [provider]  Omega-3 Fatty Acids (OMEGA 3 PO) Take 1 tablet by mouth in the morning and at bedtime.   Yes [provider]  pantoprazole  (PROTONIX ) 20 MG tablet Take 20 mg by mouth every evening. 06/22/21  Yes [provider]  relugolix (ORGOVYX) 120 MG tablet Take 120 mg by mouth daily.   Yes [provider]  sildenafil (REVATIO) 20 MG tablet SMARTSIG:3-5 Tablet(s) By Mouth PRN 01/08/24  Yes [provider]  tamsulosin  (FLOMAX ) 0.4 MG CAPS capsule Take 0.4 mg by mouth in the morning. 04/13/20  Yes [provider]  valACYclovir  (VALTREX ) 500 MG tablet Take 500 mg by mouth in the morning. 09/09/20  Yes [provider]    FAMILY HISTORY:  family history is not on file.  SOCIAL HISTORY:  reports that he quit smoking about 42 years ago. His smoking use included cigarettes. He has never been exposed to tobacco smoke. He has never used smokeless tobacco. He reports current alcohol  use of about 2.0 standard drinks of alcohol  per week. He reports that he does not use drugs.  REVIEW OF SYSTEMS:   Pertinent ROS as per HPI  VITAL SIGNS: Temp:  [97.4 F (36.3 C)-98.3 F (36.8 C)] 97.6 F (36.4 C) (01/02 0826) Pulse Rate:  [61-109] 90 (01/02 1212) Resp:  [14-26] 17 (01/02 1212) BP: (102-156)/(61-109) 102/61 (01/02 1212) SpO2:  [85 %-94 %] 90 % (01/02 1212)  PHYSICAL EXAMINATION: General: Elderly male currently on supplemental oxygen not in distress. Lungs: Rales in posterior lung fields  bilaterally. Heart: regular rate rhythm, no murmur appreciated.  Abdomen: non tender, non distended. Normal BS.  Neuro: Alert and oriented x 3.   Recent Labs  Lab 03/30/24 0401 03/31/24 0358 04/01/24 0435  NA 139 138 137  K 4.1 4.6 4.5  CL 105 106 104  CO2 23 21* 23  BUN 35* 31* 28*  CREATININE 1.00 0.93 0.78  GLUCOSE 103* 114* 100*   Recent Labs  Lab 03/30/24 0401 03/31/24 0358 04/01/24 0435  HGB 10.1* 10.0* 10.3*  HCT 30.6* 30.3* 31.7*  WBC 8.5 6.9 9.5  PLT 213 220 230   DG Chest Port 1 View Result Date: 03/31/2024 CLINICAL DATA:  Shortness of breath. EXAM: PORTABLE CHEST 1 VIEW COMPARISON:  Chest CT dated 03/28/2024. FINDINGS: There is diffuse chronic antral coarsening and right perihilar fibrotic changes. Faint areas of increased densities primarily in the left upper subpleural region appear new since the prior radiograph and may represent atypical infiltrate. No pleural effusion or pneumothorax. Stable cardiac silhouette. No acute osseous pathology. IMPRESSION: 1. Chronic interstitial coarsening and right perihilar fibrotic changes. 2. Possible atypical infiltrate in the left upper lobe. Electronically Signed   By: Vanetta Chou M.D.   On: 03/31/2024 13:19    STUDIES:  CXR/CT scan/ECHO see above.  ASSESSMENT / PLAN:  ILD/pulmonary fibrosis score: Previous lung malignancy status post SBRT: Right hilar mass, worsening in size: Parainfluenza virus with likely associated pneumonia, cannot rule out bacterial pneumonia: Acute hypoxic respiratory failure: Diastolic heart failure: -Treat for bacterial pneumonia.  Patient  already on azithromycin .  Will not add ceftriaxone  as patient's procalcitonin has normalized, CRP is improving.  Finish 5 days with azithromycin . - Less likely ILD flare.  However started on steroids which I agree with GGO seen on CT scan.  Continue daily Solu-Medrol.  Can transition to orals tomorrow. -Agree with diuresis.  Accurate I's and O's. -Keep  SpO2 greater than 92%.  Titrate down on oxygen as able. -Will need follow-up with Dr. [name] as an outpatient to make a decision on this worsening right hilar mass.  This hilar mass is unknown mass and has been stable on scan from August 2025.  For now the plan is repeat CT scan in 3 months as this mass has been stable for many months at least since November 2024.  This worsening could be because of current inflammatory change.  We will schedule outpatient follow-up with Dr. [name] close to his discharge date.  Pulmonary will continue to follow.  He will be seen on Monday next.  Please call with questions.  Total care time: 80 minutes   Care time was exclusive of separately billable procedures and treating other patients.  Care was necessary to treat or prevent imminent or life-threatening deterioration.   Care was time spent personally by me on the following activities: development of treatment plan with patient and/or surrogate as well as nursing, discussions with consultants, evaluation of patient's response to treatment, examination of patient, obtaining history from patient or surrogate, ordering and performing treatments and interventions, ordering and review of laboratory studies, ordering and review of radiographic studies and pulse oximetry.   Sammi JONETTA Fredericks, MD Pulmonary, Critical Care and Sleep Attending.   04/01/2024, 3:38 PM        "

## 2024-04-01 NOTE — Progress Notes (Signed)
 Occupational Therapy Treatment Patient Details Name: Peter Greene MRN: [id number] DOB: Jul 25, 1940 Today's Date: 04/01/2024   History of present illness 84 y.o male presents [facility] on 12/29 for pneumonia, respiratory failure, and hypoxia. Does not wear oxygen at home. CT (-) for PE. PMH: HLD, a-fib, MDS s/p stem cell transplant, ILD w/ pulmonary fibrosis, prostate CA.   OT comments  O2 saturations continue to vary - 83% to 90% on 8L with a good pleath from time to time.  Energy conservation discussed, patient is largely supervision mainly due to lines and leads.  OT can continue efforts in the acute setting, and no post acute OT is anticipated.        If plan is discharge home, recommend the following:  A little help with bathing/dressing/bathroom;A little help with walking and/or transfers;Help with stairs or ramp for entrance   Equipment Recommendations  None recommended by OT    Recommendations for Other Services      Precautions / Restrictions Precautions Precautions: Fall Recall of Precautions/Restrictions: Intact Precaution/Restrictions Comments: watch SpO2 Restrictions Weight Bearing Restrictions Per Provider Order: No       Mobility Bed Mobility               General bed mobility comments: seated in recliner upon arrival    Transfers Overall transfer level: Needs assistance Equipment used: None Transfers: Sit to/from Stand Sit to Stand: Supervision                 Balance Overall balance assessment: Needs assistance Sitting-balance support: No upper extremity supported, Feet supported Sitting balance-Leahy Scale: Good     Standing balance support: No upper extremity supported Standing balance-Leahy Scale: Fair                             ADL either performed or assessed with clinical judgement   ADL       Grooming: Wash/dry hands;Wash/dry face;Supervision/safety;Standing;Modified independent           Upper Body Dressing :  Modified independent   Lower Body Dressing: Supervision/safety;Sit to/from stand   Toilet Transfer: Supervision/safety;Ambulation;Regular Toilet                  Extremity/Trunk Assessment Upper Extremity Assessment Upper Extremity Assessment: Overall WFL for tasks assessed   Lower Extremity Assessment Lower Extremity Assessment: Defer to PT evaluation   Cervical / Trunk Assessment Cervical / Trunk Assessment: Normal    Vision Baseline Vision/History: 1 Wears glasses Patient Visual Report: No change from baseline     Perception Perception Perception: Not tested   Praxis Praxis Praxis: Not tested   Communication Communication Communication: No apparent difficulties   Cognition Arousal: Alert Behavior During Therapy: WFL for tasks assessed/performed Cognition: No apparent impairments                               Following commands: Intact        Cueing   Cueing Techniques: Verbal cues, Tactile cues, Visual cues  Exercises      Shoulder Instructions       General Comments SpO2 86% on 3.5L upon entry. Once sitting EOB, he desatted to 81%.O2 increased to 8L to improve to 89% with a lot of time to recover. He was able to do a few standing marches and walk to the chair with SpO2 dropping to 83% again. He needed >93mins to recover  back to 90% and pt left on 8L.    Pertinent Vitals/ Pain       Pain Assessment Pain Assessment: No/denies pain                                                          Frequency  Min 2X/week        Progress Toward Goals  OT Goals(current goals can now be found in the care plan section)  Progress towards OT goals: Progressing toward goals  Acute Rehab OT Goals OT Goal Formulation: With patient Time For Goal Achievement: 04/12/24 Potential to Achieve Goals: Good  Plan      Co-evaluation                 AM-PAC OT 6 Clicks Daily Activity     Outcome Measure   Help from  another person eating meals?: None Help from another person taking care of personal grooming?: None Help from another person toileting, which includes using toliet, bedpan, or urinal?: A Little Help from another person bathing (including washing, rinsing, drying)?: A Little Help from another person to put on and taking off regular upper body clothing?: None Help from another person to put on and taking off regular lower body clothing?: A Little 6 Click Score: 21    End of Session    OT Visit Diagnosis: Muscle weakness (generalized) (M62.81)   Activity Tolerance Patient tolerated treatment well   Patient Left in chair;with call bell/phone within reach   Nurse Communication Mobility status;Other (comment)        Time: 8669-8647 OT Time Calculation (min): 22 min  Charges: OT General Charges $OT Visit: 1 Visit OT Treatments $Self Care/Home Management : 8-22 mins  04/01/2024  RP, OTR/L  Acute Rehabilitation Services  Office:  [phone]   Charlie JONETTA Halsted 04/01/2024, 1:58 PM

## 2024-04-02 ENCOUNTER — Inpatient Hospital Stay (HOSPITAL_COMMUNITY)

## 2024-04-02 DIAGNOSIS — J9601 Acute respiratory failure with hypoxia: Secondary | ICD-10-CM | POA: Diagnosis not present

## 2024-04-02 LAB — BASIC METABOLIC PANEL WITH GFR
Anion gap: 11 (ref 5–15)
BUN: 35 mg/dL — ABNORMAL HIGH (ref 8–23)
CO2: 25 mmol/L (ref 22–32)
Calcium: 8.9 mg/dL (ref 8.9–10.3)
Chloride: 103 mmol/L (ref 98–111)
Creatinine, Ser: 0.96 mg/dL (ref 0.61–1.24)
GFR, Estimated: >60 mL/min
Glucose, Bld: 81 mg/dL (ref 70–99)
Potassium: 4.6 mmol/L (ref 3.5–5.1)
Sodium: 138 mmol/L (ref 135–145)

## 2024-04-02 LAB — MAGNESIUM: Magnesium: 2.5 mg/dL — ABNORMAL HIGH (ref 1.7–2.4)

## 2024-04-02 LAB — BLOOD GAS, ARTERIAL
Acid-Base Excess: 5.4 mmol/L — ABNORMAL HIGH (ref 0.0–2.0)
Bicarbonate: 29.9 mmol/L — ABNORMAL HIGH (ref 20.0–28.0)
O2 Saturation: 89 %
Patient temperature: 37
pCO2 arterial: 42 mmHg (ref 32–48)
pH, Arterial: 7.46 — ABNORMAL HIGH (ref 7.35–7.45)
pO2, Arterial: 55 mmHg — ABNORMAL LOW (ref 83–108)

## 2024-04-02 LAB — CULTURE, BLOOD (ROUTINE X 2)
Culture: NO GROWTH
Culture: NO GROWTH
Special Requests: ADEQUATE

## 2024-04-02 LAB — CBC WITH DIFFERENTIAL/PLATELET
Abs Immature Granulocytes: 0.04 K/uL (ref 0.00–0.07)
Basophils Absolute: 0 K/uL (ref 0.0–0.1)
Basophils Relative: 0 %
Eosinophils Absolute: 0 K/uL (ref 0.0–0.5)
Eosinophils Relative: 0 %
HCT: 32.7 % — ABNORMAL LOW (ref 39.0–52.0)
Hemoglobin: 10.7 g/dL — ABNORMAL LOW (ref 13.0–17.0)
Immature Granulocytes: 1 %
Lymphocytes Relative: 33 %
Lymphs Abs: 2.6 K/uL (ref 0.7–4.0)
MCH: 30.3 pg (ref 26.0–34.0)
MCHC: 32.7 g/dL (ref 30.0–36.0)
MCV: 92.6 fL (ref 80.0–100.0)
Monocytes Absolute: 0.8 K/uL (ref 0.1–1.0)
Monocytes Relative: 11 %
Neutro Abs: 4.3 K/uL (ref 1.7–7.7)
Neutrophils Relative %: 55 %
Platelets: 260 K/uL (ref 150–400)
RBC: 3.53 MIL/uL — ABNORMAL LOW (ref 4.22–5.81)
RDW: 15.6 % — ABNORMAL HIGH (ref 11.5–15.5)
WBC: 7.8 K/uL (ref 4.0–10.5)
nRBC: 0.8 % — ABNORMAL HIGH (ref 0.0–0.2)

## 2024-04-02 LAB — PROCALCITONIN: Procalcitonin: <0.1 ng/mL

## 2024-04-02 LAB — C-REACTIVE PROTEIN: CRP: 1.4 mg/dL — ABNORMAL HIGH

## 2024-04-02 MED ORDER — IOHEXOL 350 MG/ML SOLN
75.0000 mL | Freq: Once | INTRAVENOUS | Status: AC | PRN
  Administered 2024-04-02: 75 mL via INTRAVENOUS

## 2024-04-02 MED ORDER — LORAZEPAM 2 MG/ML IJ SOLN
1.0000 mg | Freq: Once | INTRAMUSCULAR | Status: AC
  Administered 2024-04-03: 1 mg via INTRAVENOUS
  Filled 2024-04-02: qty 1

## 2024-04-02 NOTE — Progress Notes (Signed)
 "                                                                                                                                                                                                                                                                                PROGRESS NOTE     Patient Demographics:    Peter Greene, is a 84 y.o. male, DOB - 03-08-1941, FMW:[id number]  Outpatient Primary MD for the patient is Peter Ade, MD    LOS - 5  Admit date - 03/28/2024    Chief Complaint  Patient presents with   Pneumonia        Shortness of Breath       Brief Narrative (HPI from H&P)   84 y.o. 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  He is accompanied by his wife, Peter Greene.   He has been experiencing shortness of breath, lightheadedness, and a cough for the past two days, along with a low-grade fever reaching 100.14F, which lasted for about a day. These symptoms are similar to previous episodes of pneumonia he has experienced.   He has a history of pulmonary nodules, which were suspected to be cancerous, although biopsies did not confirm this. He underwent radiation treatment to the chest for these nodules.   He started taking levofloxacin for three days ago for his symptoms, but it did not alleviate them as it usually does.  At his primary care provider office today he was seen and noted to be hypoxic with O2 saturations at 86% on room air.  Typically patient is not on oxygen at baseline.  Patient had been given a shot of Rocephin  as well as Solu-Medrol  while in the office.   Subjective:   Patient in bed, appears comfortable, denies any headache, no fever, no chest pain or pressure, minimal shortness of breath , no abdominal pain. No focal weakness.   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 Patient with underlying history of ILD, does not use oxygen, lung cancer, chronically follows with [facility] pulmonary Dr. Forrestine, took outpatient Levaquin course without any benefit, admitted to the hospital yesterday for possible community-acquired pneumonia, started on empiric antibiotics along with a trial of Lasix  as he has developed some crackles overnight, encouraged to sit in chair use I-S and flutter valve.  Advance activity and titrate down oxygen.  He is still a little hypoxic will add IV steroids, taper down empiric antibiotics as procalcitonin stable follow cultures, stable echocardiogram, seen by pulmonary plan discussed with them again on 04/02/2024, obtain a CTA although low chance of PE but will rule out, already on full dose Eliquis  chronically.    Likely has significant pericardial injury due to underlying ILD with acute parainfluenza infection and may just take time to heal.  Continue steroids and supportive care.   SpO2: (!) 88 % O2 Flow Rate (L/min): 3 L/min  ABG      Component Value Date/Time   PHART 7.46 (H) 04/02/2024 0754   PCO2ART 42 04/02/2024 0754   PO2ART 55 (L) 04/02/2024 0754   HCO3 29.9 (H) 04/02/2024 0754   ACIDBASEDEF 1.1 08/11/2021 1415   O2SAT 89 04/02/2024 0754     Hyponatremia Acute.  Sodium noted to be 127.  Due to dehydration.  Resolved.   Interstitial lung disease  Patient with a history of interstitial lung disease with pulmonary fibrosis as well as postradiation fibrosis followed by Dr. [name].  Patient had been given steroids at his primary care office prior to arrival.  Continue supportive care and monitor.   Paroxysmal/chronic atrial fibrillation .  Patient appears to be in atrial fibrillation currently rate controlled.  Continue Eliquis , low-dose Lopressor .  Advised to score greater than 4.    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   Hyperlipidemia  - Continue atorvastatin    GERD  - Continue Protonix     Lung malignancy  Patient with a prior history of positive PET scans, but negative biopsies for which he underwent SBRT.  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  Continue outpatient follow-up with Dr [name]     Prostate cancer  - Continue relugolix .  Patient's wife to bring home supply      Condition - Guarded  Family Communication  : Wife [name] [phone] on 03/29/2024 at 9:31 AM  Code Status :  Full  Consults  :  None  PUD Prophylaxis : PPI   Procedures  :     CTA.    TTE - 1. Left ventricular ejection fraction, by estimation, is 55 to 60%. The left ventricle has normal function. The left ventricle has no regional wall motion abnormalities. There is mild concentric left ventricular hypertrophy. Left ventricular diastolic parameters are consistent with Grade II diastolic dysfunction (pseudonormalization).  2. Right ventricular systolic function is normal. The right ventricular size is normal.  3. Left atrial size was moderately dilated.  4. The mitral valve is normal in structure. No evidence of mitral valve regurgitation. No evidence of mitral stenosis.  5. The aortic valve was not well visualized. There is moderate calcification of the aortic valve. Aortic valve regurgitation is not visualized. No aortic stenosis is present.  6. Aortic dilatation noted. There is borderline dilatation of the aortic root, measuring 38 mm.  7. The inferior vena cava is normal in size with greater than 50% respiratory variability, suggesting right atrial pressure of 3 mmHg.  8. Technically difficult study with poor acoustic windows.  CT - 1. No pulmonary embolism.  2. Interval increase of an approximately 6 cm right perihilar peribronchovascular consolidation/mass extending into the right upper and right lower lobes, with associated central 4 mm metallic density. 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 3. Persistent bilateral patchy reticulations with superimposed ground-glass opacities. 4. Persistent trace right pleural effusion. 5. Aortic valve leaflet calcification and at least 3 vessel coronary artery calcifications.      Disposition Plan  :    Status is: Inpatient  DVT Prophylaxis  :    apixaban  (ELIQUIS ) tablet 5 mg   Lab Results  Component Value Date   PLT 260 04/02/2024    Diet :  Diet Order             Diet Heart Room service appropriate? Yes; Fluid consistency: Thin  Diet effective now                    Inpatient Medications  Scheduled Meds:  apixaban   5 mg Oral BID   aspirin  EC  81 mg Oral q AM   atorvastatin   40 mg Oral q AM   fluticasone   1 spray Each Nare Daily   guaiFENesin   600 mg Oral BID   levothyroxine   50 mcg Oral Q0600   methylPREDNISolone  (SOLU-MEDROL ) injection  60 mg Intravenous Q24H   metoprolol  tartrate  25 mg Oral BID   omega-3 acid ethyl esters  1 g Oral BID   pantoprazole   20 mg Oral QPM   relugolix   120 mg Oral Daily   sodium chloride  flush  3 mL Intravenous Q12H   tamsulosin   0.4 mg Oral q AM   valACYclovir   500 mg Oral q AM   Continuous Infusions:  azithromycin  500 mg (04/01/24 1206)   PRN Meds:.acetaminophen  **OR** acetaminophen , albuterol , menthol     Objective:   Vitals:   04/02/24 0000 04/02/24 0400 04/02/24 0836 04/02/24 0900  BP: (!) 154/81 (!) 140/73  130/74  Pulse: 62 (!) 53  (!) 48  Resp: 15 15  12   Temp: (!) 97.3 F (36.3 C)  (!) 97.5 F (36.4 C)   TempSrc: Axillary  Oral   SpO2: 94% 96% 92% (!) 88%  Weight:      Height:        Wt Readings from Last 3 Encounters:  03/28/24 92 kg  02/05/24 91.7 kg  01/06/24 89.8 kg     Intake/Output Summary (Last 24 hours)  at 04/02/2024 0928 Last data filed at 04/02/2024 0600 Gross per 24 hour  Intake 540 ml  Output 1750 ml  Net -1210 ml     Physical Exam  Awake Alert, No new F.N deficits, Normal affect [place].AT,PERRAL Supple Neck, No JVD,   Symmetrical Chest wall movement, Good air movement bilaterally, +ve rales RRR,No Gallops,Rubs or new Murmurs,  +ve B.Sounds, Abd Soft, No tenderness,   No Cyanosis, Clubbing or edema        Data Review:    Recent Labs  Lab 03/28/24 1354 03/29/24 0414 03/30/24 0401 03/31/24 0358 04/01/24 0435 04/02/24 0622  WBC 7.6 5.1 8.5 6.9 9.5 7.8  HGB 11.2* 12.3* 10.1* 10.0* 10.3* 10.7*  HCT 33.6* 37.6* 30.6* 30.3* 31.7* 32.7*  PLT 239 227 213 220 230 260  MCV 91.8 92.6 91.3 92.4 91.6 92.6  MCH 30.6 30.3 30.1 30.5  29.8 30.3  MCHC 33.3 32.7 33.0 33.0 32.5 32.7  RDW 15.5 15.7* 15.8* 15.9* 15.4 15.6*  LYMPHSABS 1.8  --  1.3 1.0 1.7 2.6  MONOABS 0.7  --  0.5 0.6 0.7 0.8  EOSABS 0.2  --  0.0 0.0 0.0 0.0  BASOSABS 0.0  --  0.0 0.0 0.0 0.0    Recent Labs  Lab  0000 03/28/24 1347 03/28/24 1354 03/28/24 1759 03/29/24 0414 03/29/24 0618 03/30/24 0401 03/31/24 0358 04/01/24 0435 04/02/24 0622  NA   < >  --  127*  --  139  --  139 138 137 138  K   < >  --  4.2  --  4.5  --  4.1 4.6 4.5 4.6  CL   < >  --  94*  --  105  --  105 106 104 103  CO2   < >  --  21*  --  22  --  23 21* 23 25  ANIONGAP   < >  --  13  --  11  --  11 11 11 11   GLUCOSE   < >  --  116*  --  146*  --  103* 114* 100* 81  BUN   < >  --  32*  --  27*  --  35* 31* 28* 35*  CREATININE   < >  --  1.51*  --  1.21  --  1.00 0.93 0.78 0.96  AST  --   --  39  --   --   --   --   --   --   --   ALT  --   --  21  --   --   --   --   --   --   --   ALKPHOS  --   --  71  --   --   --   --   --   --   --   BILITOT  --   --  0.6  --   --   --   --   --   --   --   ALBUMIN  --   --  3.9  --   --   --   --   --   --   --   CRP  --   --   --   --   --  10.1* 4.6* 2.7* 1.3* 1.4*  PROCALCITON  --   --   --    <  >  --  0.39 0.28 0.11 <0.10 <0.10  LATICACIDVEN  --  1.6  --   --   --   --   --   --   --   --   INR  --   --  1.6*  --   --   --   --   --   --   --   MG  --   --   --   --   --   --  2.2 2.3 2.5* 2.5*  CALCIUM    < >  --  8.8*  --  9.1  --  8.6* 8.5* 8.7* 8.9   < > = values in this interval not displayed.      Recent Labs  Lab  0000 03/28/24 1347 03/28/24 1354 03/28/24 1759 03/29/24 9585 03/29/24 0618 03/30/24 0401 03/31/24 0358 04/01/24 0435 04/02/24 0622  CRP  --   --   --   --   --  10.1* 4.6* 2.7* 1.3* 1.4*  PROCALCITON  --   --   --    < >  --  0.39 0.28 0.11 <0.10 <0.10  LATICACIDVEN  --  1.6  --   --   --   --   --   --   --   --   INR  --   --  1.6*  --   --   --   --   --   --   --   MG  --   --   --   --   --   --  2.2 2.3 2.5* 2.5*  CALCIUM    < >  --  8.8*  --  9.1  --  8.6* 8.5* 8.7* 8.9   < > = values in this interval not displayed.    ProBNP (last 3 results) Recent Labs    03/29/24 0618 03/31/24 0358 04/01/24 0435  PROBNP 498.0* 1,236.0* 4,018.0*    --------------------------------------------------------------------------------------------------------------- Lab Results  Component Value Date   CHOL 112 01/31/2022   HDL 30 (L) 01/31/2022   LDLCALC 71 01/31/2022   TRIG 54 01/31/2022   CHOLHDL 3.7 01/31/2022    No results found for: HGBA1C No results for input(s): TSH, T4TOTAL, FREET4, T3FREE, THYROIDAB in the last 72 hours. No results for input(s): VITAMINB12, FOLATE, FERRITIN, TIBC, IRON, RETICCTPCT in the last 72 hours. ------------------------------------------------------------------------------------------------------------------ Cardiac Enzymes No results for input(s): CKMB, TROPONINI, MYOGLOBIN in the last 168 hours.  Invalid input(s): CK  Micro Results Recent Results (from the past 240 hours)  Resp panel by RT-PCR (RSV, Flu A&B, Covid) Anterior Nasal Swab     Status: None   Collection Time: 03/28/24   1:31 PM   Specimen: Anterior Nasal Swab  Result Value Ref Range Status   SARS Coronavirus 2 by RT PCR NEGATIVE NEGATIVE Final   Influenza A by PCR NEGATIVE NEGATIVE Final   Influenza B by PCR NEGATIVE NEGATIVE Final    Comment: (NOTE) The Xpert Xpress SARS-CoV-2/FLU/RSV plus assay is intended as an aid in the diagnosis of influenza from Nasopharyngeal swab specimens and should not be used as a sole basis for treatment. Nasal washings and aspirates are unacceptable for Xpert Xpress SARS-CoV-2/FLU/RSV testing.  Fact Sheet for Patients: bloggercourse.com  Fact Sheet for Healthcare Providers: seriousbroker.it  This test is not yet approved or cleared by the [place]  FDA and has been authorized for detection and/or diagnosis of SARS-CoV-2 by FDA under an Emergency Use Authorization (EUA). This EUA will remain in effect (meaning this test can be used) for the duration of the COVID-19 declaration under Section 564(b)(1) of the Act, 21 U.S.C. section 360bbb-3(b)(1), unless the authorization is terminated or revoked.     Resp Syncytial Virus by PCR NEGATIVE NEGATIVE Final    Comment: (NOTE) Fact Sheet for Patients: bloggercourse.com  Fact Sheet for Healthcare Providers: seriousbroker.it  This test is not yet approved or cleared by the [place]  FDA and has been authorized for detection and/or diagnosis of SARS-CoV-2 by FDA under an Emergency Use Authorization (EUA). This EUA will remain in effect (meaning this test can be used) for the duration of the COVID-19 declaration under Section 564(b)(1) of the Act, 21 U.S.C. section 360bbb-3(b)(1), unless the authorization is terminated or revoked.  Performed at [facility], [address] [place], [place] [address]   Culture, blood (Routine X 2) w Reflex to ID Panel     Status: None (Preliminary result)   Collection Time:  03/28/24  1:31 PM   Specimen: BLOOD  Result Value Ref Range Status   Specimen Description BLOOD SITE NOT SPECIFIED  Final   Special Requests   Final    BOTTLES DRAWN AEROBIC AND ANAEROBIC Blood Culture adequate volume   Culture   Final    NO GROWTH 4 DAYS Performed at [facility], [address] [place], [place] [address]    Report Status PENDING  Incomplete  Blood Culture (routine x 2)     Status: None (Preliminary result)   Collection Time: 03/28/24  1:36 PM   Specimen: BLOOD  Result Value Ref Range Status   Specimen Description BLOOD SITE NOT SPECIFIED  Final   Special Requests   Final    BOTTLES DRAWN AEROBIC AND ANAEROBIC Blood Culture results may not be optimal due to an inadequate volume of blood received in culture bottles   Culture   Final    NO GROWTH 4 DAYS Performed at [facility], [address] [place], [place] [address]    Report Status PENDING  Incomplete  Respiratory (~20 pathogens) panel by PCR     Status: Abnormal   Collection Time: 03/28/24  5:09 PM   Specimen: Nasopharyngeal Swab; Respiratory  Result Value Ref Range Status   Adenovirus NOT DETECTED NOT DETECTED Final   Coronavirus 229E NOT DETECTED NOT DETECTED Final    Comment: (NOTE) The Coronavirus on the Respiratory Panel, DOES NOT test for the novel  Coronavirus (2019 nCoV)    Coronavirus HKU1 NOT DETECTED NOT DETECTED Final   Coronavirus NL63 NOT DETECTED NOT DETECTED Final   Coronavirus OC43 NOT DETECTED NOT DETECTED Final   Metapneumovirus NOT DETECTED NOT DETECTED Final   Rhinovirus / Enterovirus NOT DETECTED NOT DETECTED Final   Influenza A NOT DETECTED NOT DETECTED Final   Influenza B NOT DETECTED NOT DETECTED Final   Parainfluenza Virus 1 DETECTED (A) NOT DETECTED Final   Parainfluenza Virus 2 NOT DETECTED NOT DETECTED Final   Parainfluenza Virus 3 NOT DETECTED NOT DETECTED Final   Parainfluenza Virus 4 NOT DETECTED NOT DETECTED Final   Respiratory Syncytial Virus NOT  DETECTED NOT DETECTED Final   Bordetella pertussis NOT DETECTED NOT DETECTED Final   Bordetella Parapertussis NOT DETECTED NOT DETECTED Final   Chlamydophila pneumoniae NOT DETECTED NOT DETECTED Final   Mycoplasma pneumoniae NOT DETECTED NOT DETECTED Final    Comment: Performed at [facility], [address] [place], [place] [address]  Expectorated Sputum Assessment w Gram Stain, Rflx to Resp Cult     Status: None (Preliminary result)   Collection Time: 04/01/24  5:12 PM   Specimen: Expectorated Sputum  Result Value Ref Range Status   Specimen Description EXPECTORATED SPUTUM  Final   Special Requests NONE  Final   Sputum evaluation   Final    THIS SPECIMEN IS ACCEPTABLE FOR SPUTUM CULTURE Performed at [facility], [address], [place], [place] [address]    Report Status PENDING  Incomplete  Culture, Respiratory w Gram Stain     Status: None (Preliminary result)   Collection Time: 04/01/24  5:12 PM  Result Value Ref Range Status   Specimen Description EXPECTORATED SPUTUM  Final   Special Requests NONE Reflexed from [id number]  Final   Gram Stain   Final    FEW WBC PRESENT, PREDOMINANTLY PMN FEW GRAM POSITIVE COCCI Performed at [facility], [address], [place], [place] [address]    Culture PENDING  Incomplete   Report Status PENDING  Incomplete    Radiology Report DG Chest Port 1 View Result Date: 03/31/2024 CLINICAL DATA:  Shortness of breath. EXAM: PORTABLE CHEST 1 VIEW COMPARISON:  Chest CT dated 03/28/2024. FINDINGS: There is diffuse chronic antral coarsening and right perihilar fibrotic changes. Faint areas of increased densities primarily in the left upper subpleural region appear new since the prior radiograph and may represent atypical infiltrate. No pleural effusion or pneumothorax. Stable cardiac silhouette. No acute osseous pathology. IMPRESSION: 1. Chronic interstitial coarsening and right perihilar fibrotic changes. 2. Possible atypical infiltrate  in the left upper lobe. Electronically Signed   By: Vanetta Chou M.D.   On: 03/31/2024 13:19     Signature  -   Lavada Stank M.D on 04/02/2024 at 9:28 AM   -  To page go to [email or web address]   "

## 2024-04-02 NOTE — Plan of Care (Signed)

## 2024-04-03 DIAGNOSIS — J9601 Acute respiratory failure with hypoxia: Secondary | ICD-10-CM | POA: Diagnosis not present

## 2024-04-03 LAB — CBC WITH DIFFERENTIAL/PLATELET
Abs Immature Granulocytes: 0.05 K/uL (ref 0.00–0.07)
Basophils Absolute: 0 K/uL (ref 0.0–0.1)
Basophils Relative: 0 %
Eosinophils Absolute: 0 K/uL (ref 0.0–0.5)
Eosinophils Relative: 0 %
HCT: 37.1 % — ABNORMAL LOW (ref 39.0–52.0)
Hemoglobin: 12.2 g/dL — ABNORMAL LOW (ref 13.0–17.0)
Immature Granulocytes: 1 %
Lymphocytes Relative: 29 %
Lymphs Abs: 2 K/uL (ref 0.7–4.0)
MCH: 30.4 pg (ref 26.0–34.0)
MCHC: 32.9 g/dL (ref 30.0–36.0)
MCV: 92.5 fL (ref 80.0–100.0)
Monocytes Absolute: 0.7 K/uL (ref 0.1–1.0)
Monocytes Relative: 9 %
Neutro Abs: 4.3 K/uL (ref 1.7–7.7)
Neutrophils Relative %: 61 %
Platelets: 304 K/uL (ref 150–400)
RBC: 4.01 MIL/uL — ABNORMAL LOW (ref 4.22–5.81)
RDW: 15.5 % (ref 11.5–15.5)
WBC: 7 K/uL (ref 4.0–10.5)
nRBC: 1 % — ABNORMAL HIGH (ref 0.0–0.2)

## 2024-04-03 LAB — BASIC METABOLIC PANEL WITH GFR
Anion gap: 10 (ref 5–15)
BUN: 30 mg/dL — ABNORMAL HIGH (ref 8–23)
CO2: 25 mmol/L (ref 22–32)
Calcium: 9 mg/dL (ref 8.9–10.3)
Chloride: 103 mmol/L (ref 98–111)
Creatinine, Ser: 0.9 mg/dL (ref 0.61–1.24)
GFR, Estimated: >60 mL/min
Glucose, Bld: 114 mg/dL — ABNORMAL HIGH (ref 70–99)
Potassium: 5 mmol/L (ref 3.5–5.1)
Sodium: 138 mmol/L (ref 135–145)

## 2024-04-03 MED ORDER — HYDROXYZINE HCL 25 MG PO TABS: 25.0000 mg | ORAL_TABLET | Freq: Three times a day (TID) | ORAL | Status: DC | PRN

## 2024-04-03 MED ORDER — SODIUM ZIRCONIUM CYCLOSILICATE 10 G PO PACK
10.0000 g | PACK | Freq: Once | ORAL | Status: AC
  Administered 2024-04-03: 10 g via ORAL
  Filled 2024-04-03: qty 1

## 2024-04-03 MED ORDER — HYDROXYZINE HCL 25 MG PO TABS
25.0000 mg | ORAL_TABLET | Freq: Three times a day (TID) | ORAL | Status: DC | PRN
  Administered 2024-04-03 – 2024-04-04 (×3): 25 mg via ORAL
  Filled 2024-04-03 (×3): qty 1

## 2024-04-03 NOTE — Plan of Care (Signed)

## 2024-04-03 NOTE — Progress Notes (Signed)
 "                                                                                                                                                                                                                                                                                PROGRESS NOTE     Patient Demographics:    Peter Greene, is a 84 y.o. male, DOB - [birth year], FMW:[id number]  Outpatient Primary MD for the patient is [name], MD    LOS - 6  Admit date - 03/28/2024    Chief Complaint  Patient presents with   Pneumonia        Shortness of Breath       Brief Narrative (HPI from H&P)   84 y.o. 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  He is accompanied by his wife, [name].   He has been experiencing shortness of breath, lightheadedness, and a cough for the past two days, along with a low-grade fever reaching 100.83F, which lasted for about a day. These symptoms are similar to previous episodes of pneumonia he has experienced.   He has a history of pulmonary nodules, which were suspected to be cancerous, although biopsies did not confirm this. He underwent radiation treatment to the chest for these nodules.   He started taking levofloxacin for three days ago for his symptoms, but it did not alleviate them as it usually does.  At his primary care provider office today he was seen and noted to be hypoxic with O2 saturations at 86% on room air.  Typically patient is not on oxygen at baseline.  Patient had been given a shot of Rocephin  as well as Solu-Medrol  while in the office.   Subjective:   Patient in bed, appears comfortable, denies any headache, no fever, no chest pain or pressure, no shortness of breath , no abdominal pain. No focal weakness.   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 Patient with underlying history of ILD, does not use oxygen, lung cancer, chronically follows with [place] pulmonary Dr. Forrestine, took outpatient Levaquin course without any benefit, admitted to the hospital yesterday for possible community-acquired pneumonia, started on empiric antibiotics along with a trial of Lasix  as he has developed some crackles overnight, encouraged to sit in chair use I-S and flutter valve.  Advance activity and titrate down oxygen.    He is still a little hypoxic will add IV steroids, taper down empiric antibiotics as procalcitonin stable follow cultures, stable echocardiogram, seen by pulmonary plan discussed with them again on 04/02/2024, CTA rules out PE but does show diffuse interstitial disease, already on full dose Eliquis  chronically.    Likely has significant parenchymal injury due to underlying ILD with acute parainfluenza infection and may just take time to heal.  Continue steroids and supportive care.   SpO2: 95 % O2 Flow Rate (L/min): 3 L/min  ABG      Component Value Date/Time   PHART 7.46 (H) 04/02/2024 0754   PCO2ART 42 04/02/2024 0754   PO2ART 55 (L) 04/02/2024 0754   HCO3 29.9 (H) 04/02/2024 0754   ACIDBASEDEF 1.1 08/11/2021 1415   O2SAT 89 04/02/2024 0754     Hyponatremia due to dehydration resolved..   Interstitial lung disease  Patient with a history of interstitial lung disease with pulmonary fibrosis as well as postradiation fibrosis followed by Dr. [name].  Patient had been given steroids at his primary care office prior to arrival.  Continue supportive care and monitor.   Paroxysmal/chronic atrial fibrillation .  Patient appears to be in atrial fibrillation currently rate controlled.  Continue Eliquis , low-dose Lopressor .  Advised to score greater than 4.   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   Hyperlipidemia  - Continue atorvastatin    GERD  - Continue Protonix     Lung malignancy  Patient with a prior history of positive PET scans, but negative biopsies for which he underwent SBRT.  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  Continue outpatient follow-up with Dr [name]     Prostate cancer  - Continue relugolix .  Patient's wife to bring home supply.      Condition - Guarded  Family Communication  : Wife [name] [phone] on 03/29/2024 at 9:31 AM, 04/03/2024  Code Status :  Full  Consults  :  None  PUD Prophylaxis : PPI   Procedures  :     CTA.    TTE - 1. Left ventricular ejection fraction, by estimation, is 55 to 60%. The left ventricle has normal function. The left ventricle has no regional wall motion abnormalities. There is mild concentric left ventricular hypertrophy. Left ventricular diastolic parameters are consistent with Grade II diastolic dysfunction (pseudonormalization).  2. Right ventricular systolic function is normal. The right ventricular size is normal.  3. Left atrial size was moderately dilated.  4. The mitral valve is normal in structure. No evidence of mitral valve regurgitation. No evidence of mitral stenosis.  5. The aortic valve was not well visualized. There is moderate calcification of the aortic valve. Aortic valve regurgitation is not visualized. No aortic stenosis is present.  6. Aortic dilatation noted. There is borderline dilatation of the aortic root, measuring 38 mm.  7. The inferior vena cava is normal in  size with greater than 50% respiratory variability, suggesting right atrial pressure of 3 mmHg.  8. Technically difficult study with poor acoustic windows.  CT - 1. No pulmonary embolism. 2. Interval increase of an approximately  6 cm right perihilar peribronchovascular consolidation/mass extending into the right upper and right lower lobes, with associated central 4 mm metallic density. 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 3. Persistent bilateral patchy reticulations with superimposed ground-glass opacities. 4. Persistent trace right pleural effusion. 5. Aortic valve leaflet calcification and at least 3 vessel coronary artery calcifications.      Disposition Plan  :    Status is: Inpatient  DVT Prophylaxis  :    apixaban  (ELIQUIS ) tablet 5 mg   Lab Results  Component Value Date   PLT 304 04/03/2024    Diet :  Diet Order             Diet Heart Room service appropriate? Yes; Fluid consistency: Thin  Diet effective now                    Inpatient Medications  Scheduled Meds:  apixaban   5 mg Oral BID   aspirin  EC  81 mg Oral q AM   atorvastatin   40 mg Oral q AM   fluticasone   1 spray Each Nare Daily   guaiFENesin   600 mg Oral BID   levothyroxine   50 mcg Oral Q0600   methylPREDNISolone  (SOLU-MEDROL ) injection  60 mg Intravenous Q24H   metoprolol  tartrate  25 mg Oral BID   omega-3 acid ethyl esters  1 g Oral BID   pantoprazole   20 mg Oral QPM   relugolix   120 mg Oral Daily   sodium chloride  flush  3 mL Intravenous Q12H   sodium zirconium cyclosilicate   10 g Oral Once   tamsulosin   0.4 mg Oral q AM   valACYclovir   500 mg Oral q AM   Continuous Infusions:   PRN Meds:.acetaminophen  **OR** acetaminophen , albuterol , menthol     Objective:   Vitals:   04/02/24 2005 04/02/24 2318 04/03/24 0431 04/03/24 0700  BP: (!) 124/95 (!) 122/93 136/82   Pulse:      Resp: 20     Temp: 98.3 F (36.8 C) 98.1 F (36.7 C) 98 F (36.7 C)   TempSrc: Oral Oral Axillary Oral  SpO2:      Weight:      Height:        Wt Readings from Last 3 Encounters:  03/28/24 92 kg  02/05/24 91.7 kg  01/06/24 89.8 kg     Intake/Output Summary (Last 24 hours) at 04/03/2024 1002 Last data filed at 04/03/2024  0432 Gross per 24 hour  Intake --  Output 1100 ml  Net -1100 ml     Physical Exam  Awake Alert, No new F.N deficits, Normal affect [place].AT,PERRAL Supple Neck, No JVD,   Symmetrical Chest wall movement, Good air movement bilaterally, +ve rales RRR,No Gallops,Rubs or new Murmurs,  +ve B.Sounds, Abd Soft, No tenderness,   No Cyanosis, Clubbing or edema        Data Review:    Recent Labs  Lab 03/30/24 0401 03/31/24 0358 04/01/24 0435 04/02/24 0622 04/03/24 0512  WBC 8.5 6.9 9.5 7.8 7.0  HGB 10.1* 10.0* 10.3* 10.7* 12.2*  HCT 30.6* 30.3* 31.7* 32.7* 37.1*  PLT 213 220 230 260 304  MCV 91.3 92.4 91.6 92.6 92.5  MCH 30.1 30.5 29.8 30.3 30.4  MCHC 33.0 33.0 32.5 32.7 32.9  RDW 15.8* 15.9* 15.4 15.6*  15.5  LYMPHSABS 1.3 1.0 1.7 2.6 2.0  MONOABS 0.5 0.6 0.7 0.8 0.7  EOSABS 0.0 0.0 0.0 0.0 0.0  BASOSABS 0.0 0.0 0.0 0.0 0.0    Recent Labs  Lab 03/28/24 1347 03/28/24 1354 03/28/24 1759 03/29/24 0618 03/30/24 0401 03/31/24 0358 04/01/24 0435 04/02/24 0622 04/03/24 0512  NA  --  127*   < >  --  139 138 137 138 138  K  --  4.2   < >  --  4.1 4.6 4.5 4.6 5.0  CL  --  94*   < >  --  105 106 104 103 103  CO2  --  21*   < >  --  23 21* 23 25 25   ANIONGAP  --  13   < >  --  11 11 11 11 10   GLUCOSE  --  116*   < >  --  103* 114* 100* 81 114*  BUN  --  32*   < >  --  35* 31* 28* 35* 30*  CREATININE  --  1.51*   < >  --  1.00 0.93 0.78 0.96 0.90  AST  --  39  --   --   --   --   --   --   --   ALT  --  21  --   --   --   --   --   --   --   ALKPHOS  --  71  --   --   --   --   --   --   --   BILITOT  --  0.6  --   --   --   --   --   --   --   ALBUMIN  --  3.9  --   --   --   --   --   --   --   CRP  --   --   --  10.1* 4.6* 2.7* 1.3* 1.4*  --   PROCALCITON  --   --    < > 0.39 0.28 0.11 <0.10 <0.10  --   LATICACIDVEN 1.6  --   --   --   --   --   --   --   --   INR  --  1.6*  --   --   --   --   --   --   --   MG  --   --   --   --  2.2 2.3 2.5* 2.5*  --   CALCIUM   --   8.8*   < >  --  8.6* 8.5* 8.7* 8.9 9.0   < > = values in this interval not displayed.      Recent Labs  Lab 03/28/24 1347 03/28/24 1354 03/28/24 1759 03/29/24 0618 03/30/24 0401 03/31/24 0358 04/01/24 0435 04/02/24 0622 04/03/24 0512  CRP  --   --   --  10.1* 4.6* 2.7* 1.3* 1.4*  --   PROCALCITON  --   --    < > 0.39 0.28 0.11 <0.10 <0.10  --   LATICACIDVEN 1.6  --   --   --   --   --   --   --   --   INR  --  1.6*  --   --   --   --   --   --   --   MG  --   --   --   --  2.2 2.3 2.5* 2.5*  --   CALCIUM   --  8.8*   < >  --  8.6* 8.5* 8.7* 8.9 9.0   < > = values in this interval not displayed.    ProBNP (last 3 results) Recent Labs    03/29/24 0618 03/31/24 0358 04/01/24 0435  PROBNP 498.0* 1,236.0* 4,018.0*    --------------------------------------------------------------------------------------------------------------- Lab Results  Component Value Date   CHOL 112 01/31/2022   HDL 30 (L) 01/31/2022   LDLCALC 71 01/31/2022   TRIG 54 01/31/2022   CHOLHDL 3.7 01/31/2022    No results found for: HGBA1C No results for input(s): TSH, T4TOTAL, FREET4, T3FREE, THYROIDAB in the last 72 hours. No results for input(s): VITAMINB12, FOLATE, FERRITIN, TIBC, IRON, RETICCTPCT in the last 72 hours. ------------------------------------------------------------------------------------------------------------------ Cardiac Enzymes No results for input(s): CKMB, TROPONINI, MYOGLOBIN in the last 168 hours.  Invalid input(s): CK  Micro Results Recent Results (from the past 240 hours)  Resp panel by RT-PCR (RSV, Flu A&B, Covid) Anterior Nasal Swab     Status: None   Collection Time: 03/28/24  1:31 PM   Specimen: Anterior Nasal Swab  Result Value Ref Range Status   SARS Coronavirus 2 by RT PCR NEGATIVE NEGATIVE Final   Influenza A by PCR NEGATIVE NEGATIVE Final   Influenza B by PCR NEGATIVE NEGATIVE Final    Comment: (NOTE) The Xpert Xpress  SARS-CoV-2/FLU/RSV plus assay is intended as an aid in the diagnosis of influenza from Nasopharyngeal swab specimens and should not be used as a sole basis for treatment. Nasal washings and aspirates are unacceptable for Xpert Xpress SARS-CoV-2/FLU/RSV testing.  Fact Sheet for Patients: bloggercourse.com  Fact Sheet for Healthcare Providers: seriousbroker.it  This test is not yet approved or cleared by the [place]  FDA and has been authorized for detection and/or diagnosis of SARS-CoV-2 by FDA under an Emergency Use Authorization (EUA). This EUA will remain in effect (meaning this test can be used) for the duration of the COVID-19 declaration under Section 564(b)(1) of the Act, 21 U.S.C. section 360bbb-3(b)(1), unless the authorization is terminated or revoked.     Resp Syncytial Virus by PCR NEGATIVE NEGATIVE Final    Comment: (NOTE) Fact Sheet for Patients: bloggercourse.com  Fact Sheet for Healthcare Providers: seriousbroker.it  This test is not yet approved or cleared by the [place]  FDA and has been authorized for detection and/or diagnosis of SARS-CoV-2 by FDA under an Emergency Use Authorization (EUA). This EUA will remain in effect (meaning this test can be used) for the duration of the COVID-19 declaration under Section 564(b)(1) of the Act, 21 U.S.C. section 360bbb-3(b)(1), unless the authorization is terminated or revoked.  Performed at [facility], [address], [place], [place] [address]   Culture, blood (Routine X 2) w Reflex to ID Panel     Status: None   Collection Time: 03/28/24  1:31 PM   Specimen: BLOOD  Result Value Ref Range Status   Specimen Description BLOOD SITE NOT SPECIFIED  Final   Special Requests   Final    BOTTLES DRAWN AEROBIC AND ANAEROBIC Blood Culture adequate volume   Culture   Final    NO GROWTH 5 DAYS Performed at  [facility], [address], [place], [place] [address]    Report Status 04/02/2024 FINAL  Final  Blood Culture (routine x 2)     Status: None   Collection Time: 03/28/24  1:36 PM   Specimen: BLOOD  Result Value Ref Range Status  Specimen Description BLOOD SITE NOT SPECIFIED  Final   Special Requests   Final    BOTTLES DRAWN AEROBIC AND ANAEROBIC Blood Culture results may not be optimal due to an inadequate volume of blood received in culture bottles   Culture   Final    NO GROWTH 5 DAYS Performed at [facility], [address] [place], [place] [address]    Report Status 04/02/2024 FINAL  Final  Respiratory (~20 pathogens) panel by PCR     Status: Abnormal   Collection Time: 03/28/24  5:09 PM   Specimen: Nasopharyngeal Swab; Respiratory  Result Value Ref Range Status   Adenovirus NOT DETECTED NOT DETECTED Final   Coronavirus 229E NOT DETECTED NOT DETECTED Final    Comment: (NOTE) The Coronavirus on the Respiratory Panel, DOES NOT test for the novel  Coronavirus (2019 nCoV)    Coronavirus HKU1 NOT DETECTED NOT DETECTED Final   Coronavirus NL63 NOT DETECTED NOT DETECTED Final   Coronavirus OC43 NOT DETECTED NOT DETECTED Final   Metapneumovirus NOT DETECTED NOT DETECTED Final   Rhinovirus / Enterovirus NOT DETECTED NOT DETECTED Final   Influenza A NOT DETECTED NOT DETECTED Final   Influenza B NOT DETECTED NOT DETECTED Final   Parainfluenza Virus 1 DETECTED (A) NOT DETECTED Final   Parainfluenza Virus 2 NOT DETECTED NOT DETECTED Final   Parainfluenza Virus 3 NOT DETECTED NOT DETECTED Final   Parainfluenza Virus 4 NOT DETECTED NOT DETECTED Final   Respiratory Syncytial Virus NOT DETECTED NOT DETECTED Final   Bordetella pertussis NOT DETECTED NOT DETECTED Final   Bordetella Parapertussis NOT DETECTED NOT DETECTED Final   Chlamydophila pneumoniae NOT DETECTED NOT DETECTED Final   Mycoplasma pneumoniae NOT DETECTED NOT DETECTED Final    Comment: Performed at [facility], [address], [place], [place] [address]  Expectorated Sputum Assessment w Gram Stain, Rflx to Resp Cult     Status: None (Preliminary result)   Collection Time: 04/01/24  5:12 PM   Specimen: Expectorated Sputum  Result Value Ref Range Status   Specimen Description EXPECTORATED SPUTUM  Final   Special Requests NONE  Final   Sputum evaluation   Final    THIS SPECIMEN IS ACCEPTABLE FOR SPUTUM CULTURE Performed at [facility], [address] [place], [place] [address]    Report Status PENDING  Incomplete  Culture, Respiratory w Gram Stain     Status: None (Preliminary result)   Collection Time: 04/01/24  5:12 PM  Result Value Ref Range Status   Specimen Description EXPECTORATED SPUTUM  Final   Special Requests NONE Reflexed from [id number]  Final   Gram Stain   Final    FEW WBC PRESENT, PREDOMINANTLY PMN FEW GRAM POSITIVE COCCI Performed at [facility], [address] [place], [place] [address]    Culture PENDING  Incomplete   Report Status PENDING  Incomplete    Radiology Report CT Angio Chest Pulmonary Embolism (PE) W or WO Contrast Result Date: 04/02/2024 CLINICAL DATA:  Hypoxia EXAM: CT ANGIOGRAPHY CHEST WITH CONTRAST TECHNIQUE: Multidetector CT imaging of the chest was performed using the standard protocol during bolus administration of intravenous contrast. Multiplanar CT image reconstructions and MIPs were obtained to evaluate the vascular anatomy. RADIATION DOSE REDUCTION: This exam was performed according to the departmental dose-optimization program which includes automated exposure control, adjustment of the mA and/or kV according to patient size and/or use of iterative reconstruction technique. CONTRAST:  75mL OMNIPAQUE  IOHEXOL  350 MG/ML SOLN  COMPARISON:  Four days ago FINDINGS: Cardiovascular: Satisfactory opacification of the pulmonary arteries to the segmental level. No evidence of pulmonary embolism. Normal heart size. No pericardial effusion. Mild  coronary artery calcifications are noted. Mediastinum/Nodes: No enlarged mediastinal, hilar, or axillary lymph nodes. Thyroid gland, trachea, and esophagus demonstrate no significant findings. Lungs/Pleura: No pneumothorax is noted. Minimal right pleural effusion is noted. Significantly increased patchy airspace opacities are noted throughout both lungs most consistent with multifocal pneumonia or possibly edema. 3.6 x 1.6 cm right perihilar opacity is again noted which is not significantly changed concerning for possible recurrent malignancy as noted on prior exam. Upper Abdomen: No acute abnormality. Musculoskeletal: No chest wall abnormality. No acute or significant osseous findings. Review of the MIP images confirms the above findings. IMPRESSION: 1. No definite evidence of pulmonary embolus. 2. Significantly increased patchy airspace opacities are noted throughout both lungs most consistent with multifocal pneumonia or possibly edema. 3. 3.6 x 1.6 cm right perihilar opacity is again noted which is not significantly changed concerning for possible recurrent malignancy as noted on prior exam. 4. Mild coronary artery calcifications are noted. Electronically Signed   By: Lynwood Landy Raddle M.D.   On: 04/02/2024 11:59     Signature  -   Lavada Stank M.D on 04/03/2024 at 10:02 AM   -  To page go to [email or web address]   "

## 2024-04-03 NOTE — Progress Notes (Signed)
 Pt ambulated with nurse on 4L/[place] the length of unit and then down to without SOB or respiratory distress. Upon returning  pt was 88 oxygen on 4L. Pt returned to 93 oxygen upon sitting back down in chair on 4L/[place] Aureliano VEAR Louder 04/03/2024 3:03 PM

## 2024-04-03 NOTE — Progress Notes (Signed)
 Pt ambulated the length of the unit with 4L/[place] with no SOB, when checked oxygen was at 86 during ambulation. Pt encouraged to deep breathe and stats returned to 90/ Peter Greene 04/03/2024 6:40 PM

## 2024-04-04 ENCOUNTER — Ambulatory Visit: Attending: Internal Medicine

## 2024-04-04 DIAGNOSIS — J9601 Acute respiratory failure with hypoxia: Secondary | ICD-10-CM | POA: Diagnosis not present

## 2024-04-04 LAB — CULTURE, RESPIRATORY W GRAM STAIN: Culture: NORMAL

## 2024-04-04 MED ORDER — FUROSEMIDE 10 MG/ML IJ SOLN
20.0000 mg | Freq: Once | INTRAMUSCULAR | Status: AC
  Administered 2024-04-04: 20 mg via INTRAVENOUS
  Filled 2024-04-04: qty 2

## 2024-04-04 MED ORDER — METHYLPREDNISOLONE SODIUM SUCC 40 MG IJ SOLR
40.0000 mg | INTRAMUSCULAR | Status: DC
  Administered 2024-04-04 – 2024-04-05 (×2): 40 mg via INTRAVENOUS
  Filled 2024-04-04: qty 1

## 2024-04-04 NOTE — Progress Notes (Signed)
 Physical Therapy Treatment Patient Details Name: Peter Greene MRN: [id number] DOB: 05-17-1940 Today's Date: 04/04/2024   History of Present Illness 84 y.o male presents to [facility] on 12/29 for pneumonia, respiratory failure, and hypoxia. Does not wear oxygen at home. CT (-) for PE. PMH: HLD, a-fib, MDS s/p stem cell transplant, ILD w/ pulmonary fibrosis, prostate CA.    PT Comments  Pt received in supine and eager for mobility. SpO2 improved this session with pt able to tolerate ambulation on 3L with SpO2 89-92%. Pt able to increase gait distance and perform exercises with some DOE noted, but improved with seated rest. Education proved on activity progression and O2 sat monitoring. Pt continues to benefit from PT services to progress toward functional mobility goals.     If plan is discharge home, recommend the following: A little help with walking and/or transfers;A little help with bathing/dressing/bathroom;Assistance with cooking/housework;Assist for transportation;Help with stairs or ramp for entrance   Can travel by private vehicle        Equipment Recommendations  None recommended by PT (Pt has appropriate DME at home)    Recommendations for Other Services       Precautions / Restrictions Precautions Precautions: Fall Recall of Precautions/Restrictions: Intact Precaution/Restrictions Comments: watch SpO2 Restrictions Weight Bearing Restrictions Per Provider Order: No     Mobility  Bed Mobility Overal bed mobility: Modified Independent                  Transfers Overall transfer level: Needs assistance Equipment used: None Transfers: Sit to/from Stand Sit to Stand: Supervision           General transfer comment: supervision for safety    Ambulation/Gait Ambulation/Gait assistance: Contact guard assist Gait Distance (Feet): 200 Feet (x2) Assistive device: None Gait Pattern/deviations: Step-through pattern, Decreased stride length       General Gait  Details: Pt demonstrates some instability without UE support with pt reporting neuropathy. CGA for safety, but no overt LOB   Stairs             Wheelchair Mobility     Tilt Bed    Modified Rankin (Stroke Patients Only)       Balance Overall balance assessment: Needs assistance Sitting-balance support: No upper extremity supported, Feet supported Sitting balance-Leahy Scale: Good Sitting balance - Comments: EOB   Standing balance support: No upper extremity supported, During functional activity Standing balance-Leahy Scale: Fair Standing balance comment: without AD and CGA for safety                            Communication Communication Communication: No apparent difficulties  Cognition Arousal: Alert Behavior During Therapy: WFL for tasks assessed/performed   PT - Cognitive impairments: No apparent impairments                         Following commands: Intact      Cueing Cueing Techniques: Verbal cues, Tactile cues, Visual cues  Exercises General Exercises - Lower Extremity Long Arc Quad: AROM, Seated, Both, 10 reps Other Exercises Other Exercises: x10 serial STS    General Comments General comments (skin integrity, edema, etc.): SpO2 94% on 4L at rest and during mobility. Decreased to 3L during ambulation with pt remaining >89%      Pertinent Vitals/Pain Pain Assessment Pain Assessment: No/denies pain     PT Goals (current goals can now be found in the care plan section) Acute  Rehab PT Goals Patient Stated Goal: No additional goals stated. Progress towards PT goals: Progressing toward goals    Frequency    Min 2X/week       AM-PAC PT 6 Clicks Mobility   Outcome Measure  Help needed turning from your back to your side while in a flat bed without using bedrails?: None Help needed moving from lying on your back to sitting on the side of a flat bed without using bedrails?: None Help needed moving to and from a bed  to a chair (including a wheelchair)?: A Little Help needed standing up from a chair using your arms (e.g., wheelchair or bedside chair)?: A Little Help needed to walk in hospital room?: A Little Help needed climbing 3-5 steps with a railing? : A Little 6 Click Score: 20    End of Session Equipment Utilized During Treatment: Oxygen;Gait belt Activity Tolerance: Patient tolerated treatment well Patient left: with call bell/phone within reach;in chair Nurse Communication: Mobility status PT Visit Diagnosis: Unsteadiness on feet (R26.81);Muscle weakness (generalized) (M62.81)     Time: 8847-8774 PT Time Calculation (min) (ACUTE ONLY): 33 min  Charges:    $Gait Training: 8-22 mins $Therapeutic Exercise: 8-22 mins PT General Charges $$ ACUTE PT VISIT: 1 Visit                    Darryle George, PTA Acute Rehabilitation Services Secure Chat Preferred  Office:([phone]    Darryle George 04/04/2024, 1:28 PM

## 2024-04-04 NOTE — Progress Notes (Signed)
 04/04/2024  Patient sleeping comfortably when I came by this am, sats great on 3LPM, RR 12-14  Agree with lasix , steroids for now; likely outline taper tomorrow; keep weaning O2 for sats > 90%.   Rolan Sharps MD Pulmonary Critical Care Medicine [employer] if during day (7a-7p) [phone] if after hours (7p-7a)

## 2024-04-04 NOTE — TOC Progression Note (Signed)
 Transition of Care [facility]) - Progression Note    Patient Details  Name: Peter Greene MRN: [id number] Date of Birth: 08-23-1940  [facility] CM/SW Contact  Landry DELENA Senters, RN Phone Number: 04/04/2024, 2:26 PM  Clinical Narrative:     Continued medical workup. Potential d/c tomorrow.  CM will continue to follow.   Expected Discharge Plan: Home w Home Health Services Barriers to Discharge: Continued Medical Work up               Expected Discharge Plan and Services   Discharge Planning Services: CM Consult Post Acute Care Choice: Home Health Living arrangements for the past 2 months: Single Family Home                           HH Arranged: PT HH Agency: [facility] Date HH Agency Contacted: 04/01/24 Time HH Agency Contacted: 1635 Representative spoke with at [facility] Agency: [place]   Social Drivers of Health (SDOH) Interventions SDOH Screenings   Food Insecurity: No Food Insecurity (03/28/2024)  Housing: Low Risk (03/28/2024)  Transportation Needs: No Transportation Needs (03/28/2024)  Utilities: Not At Risk (03/28/2024)  Alcohol  Screen: Low Risk (02/05/2024)  Depression (PHQ2-9): Low Risk (02/05/2024)  Social Connections: Socially Integrated (03/28/2024)  Tobacco Use: Medium Risk (03/28/2024)    Readmission Risk Interventions    01/31/2022   10:29 AM  Readmission Risk Prevention Plan  Transportation Screening Complete  Home Care Screening Complete  Medication Review (RN CM) Complete

## 2024-04-04 NOTE — Plan of Care (Signed)
  Problem: Clinical Measurements: Goal: Diagnostic test results will improve Outcome: Progressing Goal: Respiratory complications will improve Outcome: Progressing Goal: Cardiovascular complication will be avoided Outcome: Progressing   

## 2024-04-04 NOTE — Progress Notes (Signed)
 "                                                                                                                                                                                                                                                                                PROGRESS NOTE     Patient Demographics:    Peter Greene, is a 84 y.o. male, DOB - 1940-10-28, FMW:[id number]  Outpatient Primary MD for the patient is [name], MD    LOS - 7  Admit date - 03/28/2024    Chief Complaint  Patient presents with   Pneumonia        Shortness of Breath       Brief Narrative (HPI from H&P)   84 y.o. 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  He is accompanied by his wife, [name].   He has been experiencing shortness of breath, lightheadedness, and a cough for the past two days, along with a low-grade fever reaching 100.45F, which lasted for about a day. These symptoms are similar to previous episodes of pneumonia he has experienced.   He has a history of pulmonary nodules, which were suspected to be cancerous, although biopsies did not confirm this. He underwent radiation treatment to the chest for these nodules.   He started taking levofloxacin for three days ago for his symptoms, but it did not alleviate them as it usually does.  At his primary care provider office today he was seen and noted to be hypoxic with O2 saturations at 86% on room air.  Typically patient is not on oxygen at baseline.  Patient had been given a shot of Rocephin  as well as Solu-Medrol  while in the office.   Subjective:   Patient in bed, appears comfortable, denies any headache, no fever, no chest pain or pressure, no shortness of breath , no abdominal pain. No focal weakness.   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 Patient with underlying history of ILD, does not use oxygen, lung cancer, chronically follows with [place] pulmonary Dr. Forrestine, took outpatient Levaquin course without any benefit, admitted to the hospital yesterday for possible community-acquired pneumonia, started on empiric antibiotics along with a trial of Lasix  as he has developed some crackles overnight, encouraged to sit in chair use I-S and flutter valve.  Advance activity and titrate down oxygen.    He is still a little hypoxic will add IV steroids, taper down empiric antibiotics as procalcitonin stable follow cultures, stable echocardiogram, seen by pulmonary plan discussed with them again on 04/02/2024, CTA rules out PE but does show diffuse interstitial disease, already on full dose Eliquis  chronically.    Likely has significant parenchymal injury due to underlying ILD with acute parainfluenza infection and may just take time to heal.  Continue steroids and supportive care.   SpO2: 99 % O2 Flow Rate (L/min): 3 L/min  ABG      Component Value Date/Time   PHART 7.46 (H) 04/02/2024 0754   PCO2ART 42 04/02/2024 0754   PO2ART 55 (L) 04/02/2024 0754   HCO3 29.9 (H) 04/02/2024 0754   ACIDBASEDEF 1.1 08/11/2021 1415   O2SAT 89 04/02/2024 0754     Hyponatremia due to dehydration resolved..   Interstitial lung disease  Patient with a history of interstitial lung disease with pulmonary fibrosis as well as postradiation fibrosis followed by Dr. [name].  Patient had been given steroids at his primary care office prior to arrival.  Continue supportive care and monitor.   Paroxysmal/chronic atrial fibrillation .  Patient appears to be in atrial fibrillation currently rate controlled.  Continue Eliquis , low-dose Lopressor .  Advised to score greater than 4.   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   Hyperlipidemia  - Continue atorvastatin    GERD  - Continue Protonix     Lung malignancy  Patient with a prior history of positive PET scans, but negative biopsies for which he underwent SBRT.  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  Continue outpatient follow-up with Dr [name]     Prostate cancer  - Continue relugolix .  Patient's wife to bring home supply.      Condition - Guarded  Family Communication  : Wife [name] [phone] on 03/29/2024 at 9:31 AM, 04/03/2024  Code Status :  Full  Consults  :  None  PUD Prophylaxis : PPI   Procedures  :     CTA.   1. No definite evidence of pulmonary embolus. 2. Significantly increased patchy airspace opacities are noted throughout both lungs most consistent with multifocal pneumonia or possibly edema. 3. 3.6 x 1.6 cm right perihilar opacity is again noted which is not significantly changed concerning for possible recurrent malignancy as noted on prior exam. 4. Mild coronary artery calcifications are noted  TTE - 1. Left ventricular ejection fraction, by estimation, is 55 to 60%. The left ventricle has normal function. The left ventricle has no regional wall motion abnormalities. There is mild concentric left ventricular hypertrophy. Left ventricular diastolic parameters are consistent with Grade II diastolic dysfunction (pseudonormalization).  2. Right ventricular systolic function is normal. The right ventricular size is normal.  3. Left atrial size was moderately dilated.  4. The mitral valve is normal in structure. No evidence of mitral valve  regurgitation. No evidence of mitral stenosis.  5. The aortic valve was not well visualized. There is moderate calcification of the aortic valve. Aortic valve regurgitation is not  visualized. No aortic stenosis is present.  6. Aortic dilatation noted. There is borderline dilatation of the aortic root, measuring 38 mm.  7. The inferior vena cava is normal in size with greater than 50% respiratory variability, suggesting right atrial pressure of 3 mmHg.  8. Technically difficult study with poor acoustic windows.  CT - 1. No pulmonary embolism. 2. Interval increase of an approximately 6 cm right perihilar peribronchovascular consolidation/mass extending into the right upper and right lower lobes, with associated central 4 mm metallic density. 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 3. Persistent bilateral patchy reticulations with superimposed ground-glass opacities. 4. Persistent trace right pleural effusion. 5. Aortic valve leaflet calcification and at least 3 vessel coronary artery calcifications.      Disposition Plan  :    Status is: Inpatient  DVT Prophylaxis  :    apixaban  (ELIQUIS ) tablet 5 mg   Lab Results  Component Value Date   PLT 304 04/03/2024    Diet :  Diet Order             Diet Heart Room service appropriate? Yes; Fluid consistency: Thin  Diet effective now                    Inpatient Medications  Scheduled Meds:  apixaban   5 mg Oral BID   aspirin  EC  81 mg Oral q AM   atorvastatin   40 mg Oral q AM   fluticasone   1 spray Each Nare Daily   furosemide   20 mg Intravenous Once   guaiFENesin   600 mg Oral BID   levothyroxine   50 mcg Oral Q0600   methylPREDNISolone  (SOLU-MEDROL ) injection  40 mg Intravenous Q24H   metoprolol  tartrate  25 mg Oral BID   omega-3 acid ethyl esters  1 g Oral BID   pantoprazole   20 mg Oral QPM   relugolix   120 mg Oral Daily   sodium chloride  flush  3 mL Intravenous Q12H   tamsulosin   0.4 mg Oral q AM   valACYclovir   500 mg Oral q AM   Continuous Infusions:   PRN Meds:.acetaminophen  **OR** acetaminophen , albuterol , hydrOXYzine , menthol     Objective:   Vitals:   04/03/24 2044 04/04/24 0000  04/04/24 0400 04/04/24 0700  BP: (!) 177/78 (!) 142/101 127/79   Pulse: 92 75 (!) 58   Resp: 15 15 13    Temp: 98.3 F (36.8 C) 98.3 F (36.8 C) 98.3 F (36.8 C) 98.5 F (36.9 C)  TempSrc: Oral Oral Oral Oral  SpO2: 93% 98% 99%   Weight:      Height:        Wt Readings from Last 3 Encounters:  03/28/24 92 kg  02/05/24 91.7 kg  01/06/24 89.8 kg     Intake/Output Summary (Last 24 hours) at 04/04/2024 0828 Last data filed at 04/04/2024 0600 Gross per 24 hour  Intake 360 ml  Output 750 ml  Net -390 ml     Physical Exam  Awake Alert, No new F.N deficits, Normal affect [place].AT,PERRAL Supple Neck, No JVD,   Symmetrical Chest wall movement, Good air movement bilaterally, +ve rales RRR,No Gallops,Rubs or new Murmurs,  +ve B.Sounds, Abd Soft, No tenderness,   No Cyanosis, Clubbing or edema        Data Review:    Recent Labs  Lab 03/30/24 0401  03/31/24 0358 04/01/24 0435 04/02/24 0622 04/03/24 0512  WBC 8.5 6.9 9.5 7.8 7.0  HGB 10.1* 10.0* 10.3* 10.7* 12.2*  HCT 30.6* 30.3* 31.7* 32.7* 37.1*  PLT 213 220 230 260 304  MCV 91.3 92.4 91.6 92.6 92.5  MCH 30.1 30.5 29.8 30.3 30.4  MCHC 33.0 33.0 32.5 32.7 32.9  RDW 15.8* 15.9* 15.4 15.6* 15.5  LYMPHSABS 1.3 1.0 1.7 2.6 2.0  MONOABS 0.5 0.6 0.7 0.8 0.7  EOSABS 0.0 0.0 0.0 0.0 0.0  BASOSABS 0.0 0.0 0.0 0.0 0.0    Recent Labs  Lab 03/28/24 1347 03/28/24 1354 03/28/24 1759 03/29/24 0618 03/30/24 0401 03/31/24 0358 04/01/24 0435 04/02/24 0622 04/03/24 0512  NA  --  127*   < >  --  139 138 137 138 138  K  --  4.2   < >  --  4.1 4.6 4.5 4.6 5.0  CL  --  94*   < >  --  105 106 104 103 103  CO2  --  21*   < >  --  23 21* 23 25 25   ANIONGAP  --  13   < >  --  11 11 11 11 10   GLUCOSE  --  116*   < >  --  103* 114* 100* 81 114*  BUN  --  32*   < >  --  35* 31* 28* 35* 30*  CREATININE  --  1.51*   < >  --  1.00 0.93 0.78 0.96 0.90  AST  --  39  --   --   --   --   --   --   --   ALT  --  21  --   --   --   --   --    --   --   ALKPHOS  --  71  --   --   --   --   --   --   --   BILITOT  --  0.6  --   --   --   --   --   --   --   ALBUMIN  --  3.9  --   --   --   --   --   --   --   CRP  --   --   --  10.1* 4.6* 2.7* 1.3* 1.4*  --   PROCALCITON  --   --    < > 0.39 0.28 0.11 <0.10 <0.10  --   LATICACIDVEN 1.6  --   --   --   --   --   --   --   --   INR  --  1.6*  --   --   --   --   --   --   --   MG  --   --   --   --  2.2 2.3 2.5* 2.5*  --   CALCIUM   --  8.8*   < >  --  8.6* 8.5* 8.7* 8.9 9.0   < > = values in this interval not displayed.      Recent Labs  Lab 03/28/24 1347 03/28/24 1354 03/28/24 1759 03/29/24 0618 03/30/24 0401 03/31/24 0358 04/01/24 0435 04/02/24 0622 04/03/24 0512  CRP  --   --   --  10.1* 4.6* 2.7* 1.3* 1.4*  --   PROCALCITON  --   --    < > 0.39 0.28  0.11 <0.10 <0.10  --   LATICACIDVEN 1.6  --   --   --   --   --   --   --   --   INR  --  1.6*  --   --   --   --   --   --   --   MG  --   --   --   --  2.2 2.3 2.5* 2.5*  --   CALCIUM   --  8.8*   < >  --  8.6* 8.5* 8.7* 8.9 9.0   < > = values in this interval not displayed.    ProBNP (last 3 results) Recent Labs    03/29/24 0618 03/31/24 0358 04/01/24 0435  PROBNP 498.0* 1,236.0* 4,018.0*    --------------------------------------------------------------------------------------------------------------- Lab Results  Component Value Date   CHOL 112 01/31/2022   HDL 30 (L) 01/31/2022   LDLCALC 71 01/31/2022   TRIG 54 01/31/2022   CHOLHDL 3.7 01/31/2022    No results found for: HGBA1C No results for input(s): TSH, T4TOTAL, FREET4, T3FREE, THYROIDAB in the last 72 hours. No results for input(s): VITAMINB12, FOLATE, FERRITIN, TIBC, IRON, RETICCTPCT in the last 72 hours. ------------------------------------------------------------------------------------------------------------------ Cardiac Enzymes No results for input(s): CKMB, TROPONINI, MYOGLOBIN in the last 168  hours.  Invalid input(s): CK  Micro Results Recent Results (from the past 240 hours)  Resp panel by RT-PCR (RSV, Flu A&B, Covid) Anterior Nasal Swab     Status: None   Collection Time: 03/28/24  1:31 PM   Specimen: Anterior Nasal Swab  Result Value Ref Range Status   SARS Coronavirus 2 by RT PCR NEGATIVE NEGATIVE Final   Influenza A by PCR NEGATIVE NEGATIVE Final   Influenza B by PCR NEGATIVE NEGATIVE Final    Comment: (NOTE) The Xpert Xpress SARS-CoV-2/FLU/RSV plus assay is intended as an aid in the diagnosis of influenza from Nasopharyngeal swab specimens and should not be used as a sole basis for treatment. Nasal washings and aspirates are unacceptable for Xpert Xpress SARS-CoV-2/FLU/RSV testing.  Fact Sheet for Patients: bloggercourse.com  Fact Sheet for Healthcare Providers: seriousbroker.it  This test is not yet approved or cleared by the [place]  FDA and has been authorized for detection and/or diagnosis of SARS-CoV-2 by FDA under an Emergency Use Authorization (EUA). This EUA will remain in effect (meaning this test can be used) for the duration of the COVID-19 declaration under Section 564(b)(1) of the Act, 21 U.S.C. section 360bbb-3(b)(1), unless the authorization is terminated or revoked.     Resp Syncytial Virus by PCR NEGATIVE NEGATIVE Final    Comment: (NOTE) Fact Sheet for Patients: bloggercourse.com  Fact Sheet for Healthcare Providers: seriousbroker.it  This test is not yet approved or cleared by the [place]  FDA and has been authorized for detection and/or diagnosis of SARS-CoV-2 by FDA under an Emergency Use Authorization (EUA). This EUA will remain in effect (meaning this test can be used) for the duration of the COVID-19 declaration under Section 564(b)(1) of the Act, 21 U.S.C. section 360bbb-3(b)(1), unless the authorization is terminated  or revoked.  Performed at [facility], [address], [place], [place] [address]   Culture, blood (Routine X 2) w Reflex to ID Panel     Status: None   Collection Time: 03/28/24  1:31 PM   Specimen: BLOOD  Result Value Ref Range Status   Specimen Description BLOOD SITE NOT SPECIFIED  Final   Special Requests   Final    BOTTLES  DRAWN AEROBIC AND ANAEROBIC Blood Culture adequate volume   Culture   Final    NO GROWTH 5 DAYS Performed at [facility], [address], [place], [place] [address]    Report Status 04/02/2024 FINAL  Final  Blood Culture (routine x 2)     Status: None   Collection Time: 03/28/24  1:36 PM   Specimen: BLOOD  Result Value Ref Range Status   Specimen Description BLOOD SITE NOT SPECIFIED  Final   Special Requests   Final    BOTTLES DRAWN AEROBIC AND ANAEROBIC Blood Culture results may not be optimal due to an inadequate volume of blood received in culture bottles   Culture   Final    NO GROWTH 5 DAYS Performed at [facility], [address], [place], [place] [address]    Report Status 04/02/2024 FINAL  Final  Respiratory (~20 pathogens) panel by PCR     Status: Abnormal   Collection Time: 03/28/24  5:09 PM   Specimen: Nasopharyngeal Swab; Respiratory  Result Value Ref Range Status   Adenovirus NOT DETECTED NOT DETECTED Final   Coronavirus 229E NOT DETECTED NOT DETECTED Final    Comment: (NOTE) The Coronavirus on the Respiratory Panel, DOES NOT test for the novel  Coronavirus (2019 nCoV)    Coronavirus HKU1 NOT DETECTED NOT DETECTED Final   Coronavirus NL63 NOT DETECTED NOT DETECTED Final   Coronavirus OC43 NOT DETECTED NOT DETECTED Final   Metapneumovirus NOT DETECTED NOT DETECTED Final   Rhinovirus / Enterovirus NOT DETECTED NOT DETECTED Final   Influenza A NOT DETECTED NOT DETECTED Final   Influenza B NOT DETECTED NOT DETECTED Final   Parainfluenza Virus 1 DETECTED (A) NOT DETECTED Final   Parainfluenza Virus 2 NOT DETECTED NOT  DETECTED Final   Parainfluenza Virus 3 NOT DETECTED NOT DETECTED Final   Parainfluenza Virus 4 NOT DETECTED NOT DETECTED Final   Respiratory Syncytial Virus NOT DETECTED NOT DETECTED Final   Bordetella pertussis NOT DETECTED NOT DETECTED Final   Bordetella Parapertussis NOT DETECTED NOT DETECTED Final   Chlamydophila pneumoniae NOT DETECTED NOT DETECTED Final   Mycoplasma pneumoniae NOT DETECTED NOT DETECTED Final    Comment: Performed at [facility], [address] [place], [place] [address]  Expectorated Sputum Assessment w Gram Stain, Rflx to Resp Cult     Status: None (Preliminary result)   Collection Time: 04/01/24  5:12 PM   Specimen: Expectorated Sputum  Result Value Ref Range Status   Specimen Description EXPECTORATED SPUTUM  Final   Special Requests NONE  Final   Sputum evaluation   Final    THIS SPECIMEN IS ACCEPTABLE FOR SPUTUM CULTURE Performed at [facility], [address] [place], [place] [address]    Report Status PENDING  Incomplete  Culture, Respiratory w Gram Stain     Status: None (Preliminary result)   Collection Time: 04/01/24  5:12 PM  Result Value Ref Range Status   Specimen Description EXPECTORATED SPUTUM  Final   Special Requests NONE Reflexed from [id number]  Final   Gram Stain   Final    FEW WBC PRESENT, PREDOMINANTLY PMN FEW GRAM POSITIVE COCCI    Culture   Final    CULTURE REINCUBATED FOR BETTER GROWTH Performed at [facility], [address] [place], [place] [address]    Report Status PENDING  Incomplete    Radiology Report CT Angio Chest Pulmonary Embolism (PE) W or WO Contrast Result Date: 04/02/2024  CLINICAL DATA:  Hypoxia EXAM: CT ANGIOGRAPHY CHEST WITH CONTRAST TECHNIQUE: Multidetector CT imaging of the chest was performed using the standard protocol during bolus administration of intravenous contrast. Multiplanar CT image reconstructions and MIPs were obtained to evaluate the vascular anatomy. RADIATION DOSE REDUCTION: This  exam was performed according to the departmental dose-optimization program which includes automated exposure control, adjustment of the mA and/or kV according to patient size and/or use of iterative reconstruction technique. CONTRAST:  75mL OMNIPAQUE  IOHEXOL  350 MG/ML SOLN COMPARISON:  Four days ago FINDINGS: Cardiovascular: Satisfactory opacification of the pulmonary arteries to the segmental level. No evidence of pulmonary embolism. Normal heart size. No pericardial effusion. Mild coronary artery calcifications are noted. Mediastinum/Nodes: No enlarged mediastinal, hilar, or axillary lymph nodes. Thyroid gland, trachea, and esophagus demonstrate no significant findings. Lungs/Pleura: No pneumothorax is noted. Minimal right pleural effusion is noted. Significantly increased patchy airspace opacities are noted throughout both lungs most consistent with multifocal pneumonia or possibly edema. 3.6 x 1.6 cm right perihilar opacity is again noted which is not significantly changed concerning for possible recurrent malignancy as noted on prior exam. Upper Abdomen: No acute abnormality. Musculoskeletal: No chest wall abnormality. No acute or significant osseous findings. Review of the MIP images confirms the above findings. IMPRESSION: 1. No definite evidence of pulmonary embolus. 2. Significantly increased patchy airspace opacities are noted throughout both lungs most consistent with multifocal pneumonia or possibly edema. 3. 3.6 x 1.6 cm right perihilar opacity is again noted which is not significantly changed concerning for possible recurrent malignancy as noted on prior exam. 4. Mild coronary artery calcifications are noted. Electronically Signed   By: Lynwood Landy Raddle M.D.   On: 04/02/2024 11:59     Signature  -   Lavada Stank M.D on 04/04/2024 at 8:28 AM   -  To page go to [email or web address]   "

## 2024-04-05 ENCOUNTER — Other Ambulatory Visit (HOSPITAL_COMMUNITY): Payer: Self-pay

## 2024-04-05 DIAGNOSIS — J9601 Acute respiratory failure with hypoxia: Secondary | ICD-10-CM | POA: Diagnosis not present

## 2024-04-05 LAB — CBC WITH DIFFERENTIAL/PLATELET
Abs Immature Granulocytes: 0.1 K/uL — ABNORMAL HIGH (ref 0.00–0.07)
Basophils Absolute: 0 K/uL (ref 0.0–0.1)
Basophils Relative: 0 %
Eosinophils Absolute: 0 K/uL (ref 0.0–0.5)
Eosinophils Relative: 1 %
HCT: 34.3 % — ABNORMAL LOW (ref 39.0–52.0)
Hemoglobin: 11.4 g/dL — ABNORMAL LOW (ref 13.0–17.0)
Immature Granulocytes: 1 %
Lymphocytes Relative: 35 %
Lymphs Abs: 3 K/uL (ref 0.7–4.0)
MCH: 30.4 pg (ref 26.0–34.0)
MCHC: 33.2 g/dL (ref 30.0–36.0)
MCV: 91.5 fL (ref 80.0–100.0)
Monocytes Absolute: 1 K/uL (ref 0.1–1.0)
Monocytes Relative: 11 %
Neutro Abs: 4.4 K/uL (ref 1.7–7.7)
Neutrophils Relative %: 52 %
Platelets: 334 K/uL (ref 150–400)
RBC: 3.75 MIL/uL — ABNORMAL LOW (ref 4.22–5.81)
RDW: 15.9 % — ABNORMAL HIGH (ref 11.5–15.5)
WBC: 8.5 K/uL (ref 4.0–10.5)
nRBC: 0.5 % — ABNORMAL HIGH (ref 0.0–0.2)

## 2024-04-05 LAB — BASIC METABOLIC PANEL WITH GFR
Anion gap: 9 (ref 5–15)
BUN: 41 mg/dL — ABNORMAL HIGH (ref 8–23)
CO2: 28 mmol/L (ref 22–32)
Calcium: 9 mg/dL (ref 8.9–10.3)
Chloride: 101 mmol/L (ref 98–111)
Creatinine, Ser: 0.97 mg/dL (ref 0.61–1.24)
GFR, Estimated: >60 mL/min
Glucose, Bld: 112 mg/dL — ABNORMAL HIGH (ref 70–99)
Potassium: 4.2 mmol/L (ref 3.5–5.1)
Sodium: 138 mmol/L (ref 135–145)

## 2024-04-05 MED ORDER — ALBUTEROL SULFATE HFA 108 (90 BASE) MCG/ACT IN AERS
2.0000 | INHALATION_SPRAY | Freq: Four times a day (QID) | RESPIRATORY_TRACT | 2 refills | Status: DC | PRN
  Filled 2024-04-05: qty 6.7, 30d supply, fill #0

## 2024-04-05 MED ORDER — PREDNISONE 5 MG PO TABS
ORAL_TABLET | ORAL | 0 refills | Status: AC
  Filled 2024-04-05: qty 65, 14d supply, fill #0

## 2024-04-05 MED ORDER — GUAIFENESIN ER 600 MG PO TB12
600.0000 mg | ORAL_TABLET | Freq: Two times a day (BID) | ORAL | 0 refills | Status: DC
  Filled 2024-04-05: qty 15, 8d supply, fill #0

## 2024-04-05 MED ORDER — FUROSEMIDE 10 MG/ML IJ SOLN
20.0000 mg | Freq: Once | INTRAMUSCULAR | Status: AC
  Administered 2024-04-05: 20 mg via INTRAVENOUS
  Filled 2024-04-05: qty 2

## 2024-04-05 NOTE — Plan of Care (Signed)
  Problem: Clinical Measurements: Goal: Cardiovascular complication will be avoided Outcome: Progressing   Problem: Activity: Goal: Risk for activity intolerance will decrease Outcome: Progressing   Problem: Nutrition: Goal: Adequate nutrition will be maintained Outcome: Progressing   Problem: Elimination: Goal: Will not experience complications related to bowel motility Outcome: Progressing   

## 2024-04-05 NOTE — Discharge Instructions (Addendum)
 Wear a face mask if you are with anyone else in the room for the next 5 days   Follow with Primary MD [name], MD in 7 days   Get CBC, CMP, Magnesium , 2 view Chest X ray -  checked next visit with your primary MD   Activity: As tolerated with Full fall precautions use walker/cane & assistance as needed  Disposition Home    Diet: Heart Healthy    Special Instructions: If you have smoked or chewed Tobacco  in the last 2 yrs please stop smoking, stop any regular Alcohol   and or any Recreational drug use.  On your next visit with your primary care physician please Get Medicines reviewed and adjusted.  Please request your Prim.MD to go over all Hospital Tests and Procedure/Radiological results at the follow up, please get all Hospital records sent to your Prim MD by signing hospital release before you go home.  If you experience worsening of your admission symptoms, develop shortness of breath, life threatening emergency, suicidal or homicidal thoughts you must seek medical attention immediately by calling 911 or calling your MD immediately  if symptoms less severe.  You Must read complete instructions/literature along with all the possible adverse reactions/side effects for all the Medicines you take and that have been prescribed to you. Take any new Medicines after you have completely understood and accpet all the possible adverse reactions/side effects.   Do not drive when taking Pain medications.  Do not take more than prescribed Pain, Sleep and Anxiety Medications  Wear Seat belts while driving.

## 2024-04-05 NOTE — TOC Transition Note (Signed)
 Transition of Care [facility]) - Discharge Note   Patient Details  Name: Peter Greene MRN: [id number] Date of Birth: 1940/07/04  Transition of Care [facility]) CM/SW Contact:  Landry DELENA Senters, RN Phone Number: 04/05/2024, 11:38 AM   Clinical Narrative:    Patient will be discharging home today, with family providing transportation.   HH arranged through [facility]. Info on AVS.   DME-home O2 arranged through [employer]. O2 tank to be delivered to patient room for transport home and the rest of O2 supplies will be delivered to patient's home. Phone number/address verified.   No further needs identified by CM.   Final next level of care: Home w Home Health Services Barriers to Discharge: No Barriers Identified   Patient Goals and CMS Choice   CMS Medicare.gov Compare Post Acute Care list provided to:: Patient Choice offered to / list presented to : Patient      Discharge Placement                       Discharge Plan and Services Additional resources added to the After Visit Summary for     Discharge Planning Services: CM Consult Post Acute Care Choice: Home Health          DME Arranged: Oxygen DME Agency: [employer] Date DME Agency Contacted: 04/05/24 Time DME Agency Contacted: [phone] Representative spoke with at DME Agency: [place] HH Arranged: RN, PT HH Agency: [facility] Date [facility] Agency Contacted: 04/03/24 Time HH Agency Contacted: [phone] Representative spoke with at [facility] Agency: [place]  Social Drivers of Health (SDOH) Interventions SDOH Screenings   Food Insecurity: No Food Insecurity (03/28/2024)  Housing: Low Risk (03/28/2024)  Transportation Needs: No Transportation Needs (03/28/2024)  Utilities: Not At Risk (03/28/2024)  Alcohol  Screen: Low Risk (02/05/2024)  Depression (PHQ2-9): Low Risk (02/05/2024)  Social Connections: Socially Integrated (03/28/2024)  Tobacco Use: Medium Risk (03/28/2024)     Readmission Risk Interventions    01/31/2022    10:29 AM  Readmission Risk Prevention Plan  Transportation Screening Complete  Home Care Screening Complete  Medication Review (RN CM) Complete

## 2024-04-05 NOTE — Progress Notes (Addendum)
 Performed walk test with pt.Pt walked 233ft.    Patient Saturations on Room Air at Rest = 91%  Patient Saturations on Room Air while Ambulating = 86%  Patient Saturations on 3 Liters of oxygen while Ambulating = 92%

## 2024-04-05 NOTE — Discharge Summary (Signed)
 "                                                                                                                                                                               Discharge summary note.  Peter Greene FMW:[id number] DOB: [birth year] DOA: 03/28/2024  PCP: [name], MD  Admit date: 03/28/2024  Discharge date: 04/05/2024  Admitted From: Home   Disposition:  Home   Recommendations for Outpatient Follow-up:   Follow up with PCP in 1-2 weeks  PCP Please obtain BMP/CBC, 2 view CXR in 1week,  (see Discharge instructions)   PCP Please follow up on the following pending results:    Home Health: PT, RN if qualifies Equipment/Devices: See below Consultations: Pulmonary Discharge Condition: Stable    CODE STATUS: Full    Diet Recommendation: Heart Healthy     Chief Complaint  Patient presents with   Pneumonia        Shortness of Breath     Brief history of present illness from the day of admission and additional interim summary    84 y.o. 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  He is accompanied by his wife, [name].   He has been experiencing shortness of breath, lightheadedness, and a cough for the past two days, along with a low-grade fever reaching 100.56F, which lasted for about a day. These symptoms are similar to previous episodes of pneumonia he has experienced.   He has a history of pulmonary nodules, which were suspected to be cancerous, although biopsies did not confirm this. He underwent radiation treatment to the chest for these nodules.   He started taking levofloxacin for three days ago for his symptoms, but it did not alleviate them as it usually does.  At his primary care provider office today he was seen and noted to be hypoxic with O2  saturations at 86% on room air.  Typically patient is not on oxygen at baseline.  Patient had been given a shot of Rocephin  as well as Solu-Medrol  while in the office.                                                                 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  Patient with underlying history of ILD, does not use oxygen, lung cancer, chronically follows with [facility] Dr. Forrestine, took outpatient Levaquin course without any benefit, admitted to the hospital yesterday for possible community-acquired pneumonia, was treated with empiric antibiotics and steroids, hypoxia much improved he was up to 8 L at 1 point now requiring 2 to 3 L and is symptom-free on it.  Was seen by pulmonary here as well.  Will be discharged home on steroid taper with outpatient PCP and pulmonary follow-up.  Currently he is symptom-free.   Interstitial lung disease  Patient with a history of interstitial lung disease with pulmonary fibrosis as well as postradiation fibrosis followed by Dr. [name].  Patient had been given steroids at his primary care office prior to arrival.  Continue supportive care and monitor.  CTA here no PE.   Paroxysmal/chronic atrial fibrillation .  Patient appears to be in atrial fibrillation currently rate controlled.  Continue Eliquis , low-dose Lopressor .  Chad VASc 2 score greater than 4.   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   Hyponatremia due to dehydration resolved.  Hyperlipidemia  - Continue atorvastatin    GERD  - Continue Protonix     Lung malignancy  Patient with a prior history of positive PET scans, but negative biopsies for which he underwent SBRT.  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  Continue outpatient follow-up with Dr [name]     Prostate cancer  - Continue relugolix .  Patient's wife to bring home supply.   Discharge diagnosis     Principal Problem:   Acute respiratory failure with hypoxia (HCC) Active Problems:   PNA (pneumonia)   Transient hypotension   Hyponatremia   ILD (interstitial lung disease) (HCC)   A-fib (HCC)   MDS (myelodysplastic syndrome) (HCC)   Renal insufficiency   HLD (hyperlipidemia)   GERD (gastroesophageal reflux disease)   Lung malignancy [facility])    Discharge instructions    Discharge Instructions     Discharge instructions   Complete by: As directed    Wear a face mask if you are with anyone else in the room for the next 5 days   Follow with Primary MD [name], MD in 7 days   Get CBC, CMP, Magnesium , 2 view Chest X ray -  checked next visit with your primary MD   Activity: As tolerated with Full fall precautions use walker/cane & assistance as needed  Disposition Home    Diet: Heart Healthy    Special Instructions: If you have smoked or chewed Tobacco  in the last 2 yrs please stop smoking, stop any regular Alcohol   and or any Recreational drug use.  On your next visit with your primary care physician please Get Medicines reviewed and adjusted.  Please request your Prim.MD to go over all Hospital Tests and Procedure/Radiological results at the follow up, please get all Hospital records sent to your Prim MD by signing hospital release before you go home.  If you experience worsening of your admission symptoms, develop shortness of breath, life threatening emergency, suicidal or homicidal thoughts you must seek medical attention immediately by calling 911 or calling your MD immediately  if symptoms less severe.  You Must read complete instructions/literature along with all the possible adverse reactions/side effects for all the Medicines you  take and that  have been prescribed to you. Take any new Medicines after you have completely understood and accpet all the possible adverse reactions/side effects.   Do not drive when taking Pain medications.  Do not take more than prescribed Pain, Sleep and Anxiety Medications  Wear Seat belts while driving.   Increase activity slowly   Complete by: As directed        Discharge Medications   Allergies as of 04/05/2024   No Known Allergies      Medication List     STOP taking these medications    levofloxacin 500 MG tablet Commonly known as: LEVAQUIN       TAKE these medications    albuterol  108 (90 Base) MCG/ACT inhaler Commonly known as: VENTOLIN  HFA Inhale 2 puffs into the lungs every 6 (six) hours as needed for wheezing or shortness of breath.   aspirin  EC 81 MG tablet Take 81 mg by mouth in the morning. Swallow whole.   atorvastatin  40 MG tablet Commonly known as: LIPITOR Take 40 mg by mouth in the morning.   Eliquis  5 MG Tabs tablet Generic drug: apixaban  Take 5 mg by mouth 2 (two) times daily.   fluticasone  50 MCG/ACT nasal spray Commonly known as: FLONASE  Place 1 spray into both nostrils daily.   gabapentin  100 MG capsule Commonly known as: NEURONTIN  Take 100 mg by mouth as needed.   guaiFENesin  600 MG 12 hr tablet Commonly known as: MUCINEX  Take 1 tablet (600 mg total) by mouth 2 (two) times daily.   HYDROcodone  bit-homatropine 5-1.5 MG/5ML syrup Commonly known as: HYCODAN Take 5 mLs by mouth every 6 (six) hours.   ipratropium 0.03 % nasal spray Commonly known as: ATROVENT Place 2 sprays into both nostrils daily.   Krill Oil 350 MG Caps Take 350 mg by mouth in the morning and at bedtime.   levothyroxine  50 MCG tablet Commonly known as: SYNTHROID  Take 50 mcg by mouth daily.   Melatonin 10 MG Caps Take 10 mg by mouth as needed.   metoprolol  tartrate 25 MG tablet Commonly known as: LOPRESSOR  Take 25 mg by mouth 2 (two) times daily.   multivitamin  with minerals Tabs tablet Take 1 tablet by mouth in the morning. Centrum   OMEGA 3 PO Take 1 tablet by mouth in the morning and at bedtime.   pantoprazole  20 MG tablet Commonly known as: PROTONIX  Take 20 mg by mouth every evening.   predniSONE  5 MG tablet Commonly known as: DELTASONE  Label  & dispense according to the schedule below. take 8 Pills PO for 3 days, 6 Pills PO for 3 days, 4 Pills PO for 2 days, 2 Pills PO for 2 days, 1 Pills PO for 2 days, 1/2 Pill  PO for 2 days then STOP.   relugolix  120 MG tablet Commonly known as: ORGOVYX  Take 120 mg by mouth daily.   sildenafil 20 MG tablet Commonly known as: REVATIO SMARTSIG:3-5 Tablet(s) By Mouth PRN   tamsulosin  0.4 MG Caps capsule Commonly known as: FLOMAX  Take 0.4 mg by mouth in the morning.   valACYclovir  500 MG tablet Commonly known as: VALTREX  Take 500 mg by mouth in the morning.               Durable Medical Equipment  (From admission, onward)           Start     Ordered   04/05/24 0754  For home use only DME Walker rolling  Once  Comments: 5 wheel  Question Answer Comment  Walker: With 5 Inch Wheels   Patient needs a walker to treat with the following condition Weakness      04/05/24 0753   04/04/24 0831  For home use only DME oxygen  Once       Question Answer Comment  Length of Need 6 Months   Mode or (Route) Nasal cannula   Liters per Minute 4   Frequency Continuous (stationary and portable oxygen unit needed)   Oxygen conserving device Yes   Oxygen delivery system: Gas      04/04/24 0830             Contact information for follow-up providers     [name], MD. Schedule an appointment as soon as possible for a visit in 1 week(s).   Specialty: Internal Medicine Why: And your pulmonologist within a week of discharge Contact information: [address] [place] [address] [phone]         Mannam, Praveen, MD. Schedule an appointment as soon as possible for a  visit in 1 week(s).   Specialty: Pulmonary Disease Contact information: [address] [place] [address] [phone]              Contact information for after-discharge care     Home Medical Care     [facility]) .   Service: Home Health Services Why: Someone from [facility] will contact you to arrange start date and time for your Home Health Physical therapy. Contact information: [address] [place]  [phone]                     Major procedures and Radiology Reports - PLEASE review detailed and final reports thoroughly  -       CT Angio Chest Pulmonary Embolism (PE) W or WO Contrast Result Date: 04/02/2024 CLINICAL DATA:  Hypoxia EXAM: CT ANGIOGRAPHY CHEST WITH CONTRAST TECHNIQUE: Multidetector CT imaging of the chest was performed using the standard protocol during bolus administration of intravenous contrast. Multiplanar CT image reconstructions and MIPs were obtained to evaluate the vascular anatomy. RADIATION DOSE REDUCTION: This exam was performed according to the departmental dose-optimization program which includes automated exposure control, adjustment of the mA and/or kV according to patient size and/or use of iterative reconstruction technique. CONTRAST:  75mL OMNIPAQUE  IOHEXOL  350 MG/ML SOLN COMPARISON:  Four days ago FINDINGS: Cardiovascular: Satisfactory opacification of the pulmonary arteries to the segmental level. No evidence of pulmonary embolism. Normal heart size. No pericardial effusion. Mild coronary artery calcifications are noted. Mediastinum/Nodes: No enlarged mediastinal, hilar, or axillary lymph nodes. Thyroid gland, trachea, and esophagus demonstrate no significant findings. Lungs/Pleura: No pneumothorax is noted. Minimal right pleural effusion is noted. Significantly increased patchy airspace opacities are noted throughout both lungs most  consistent with multifocal pneumonia or possibly edema. 3.6 x 1.6 cm right perihilar opacity is again noted which is not significantly changed concerning for possible recurrent malignancy as noted on prior exam. Upper Abdomen: No acute abnormality. Musculoskeletal: No chest wall abnormality. No acute or significant osseous findings. Review of the MIP images confirms the above findings. IMPRESSION: 1. No definite evidence of pulmonary embolus. 2. Significantly increased patchy airspace opacities are noted throughout both lungs most consistent with multifocal pneumonia or possibly edema. 3. 3.6 x 1.6 cm right perihilar opacity is again noted which is not significantly changed concerning for possible recurrent malignancy as  noted on prior exam. 4. Mild coronary artery calcifications are noted. Electronically Signed   By: Lynwood Landy Raddle M.D.   On: 04/02/2024 11:59   DG Chest Port 1 View Result Date: 03/31/2024 CLINICAL DATA:  Shortness of breath. EXAM: PORTABLE CHEST 1 VIEW COMPARISON:  Chest CT dated 03/28/2024. FINDINGS: There is diffuse chronic antral coarsening and right perihilar fibrotic changes. Faint areas of increased densities primarily in the left upper subpleural region appear new since the prior radiograph and may represent atypical infiltrate. No pleural effusion or pneumothorax. Stable cardiac silhouette. No acute osseous pathology. IMPRESSION: 1. Chronic interstitial coarsening and right perihilar fibrotic changes. 2. Possible atypical infiltrate in the left upper lobe. Electronically Signed   By: Vanetta Chou M.D.   On: 03/31/2024 13:19   ECHOCARDIOGRAM COMPLETE Result Date: 03/29/2024    ECHOCARDIOGRAM REPORT   Patient Name:   Peter Greene Date of Exam: 03/29/2024 Medical Rec #:  [id number]       Height:       68.0 in Accession #:    [id number]      Weight:       202.8 lb Date of Birth:  [birth year]      BSA:          2.056 m Patient Age:    83 years        BP:           128/76 mmHg  Patient Gender: M               HR:           68 bpm. Exam Location:  Inpatient Procedure: 2D Echo, Cardiac Doppler, Color Doppler and Intracardiac            Opacification Agent (Both Spectral and Color Flow Doppler were            utilized during procedure). Indications:     CHF Acute Diastolic I50.31  History:         Patient has prior history of Echocardiogram examinations, most                  recent 08/12/2021. Arrythmias:Atrial Fibrillation; Risk                  Factors:Hypertension.  Sonographer:     Tinnie Gosling RDCS Referring Phys:  [id number] [facility] Diagnosing Phys: Ezra Kanner IMPRESSIONS  1. Left ventricular ejection fraction, by estimation, is 55 to 60%. The left ventricle has normal function. The left ventricle has no regional wall motion abnormalities. There is mild concentric left ventricular hypertrophy. Left ventricular diastolic parameters are consistent with Grade II diastolic dysfunction (pseudonormalization).  2. Right ventricular systolic function is normal. The right ventricular size is normal.  3. Left atrial size was moderately dilated.  4. The mitral valve is normal in structure. No evidence of mitral valve regurgitation. No evidence of mitral stenosis.  5. The aortic valve was not well visualized. There is moderate calcification of the aortic valve. Aortic valve regurgitation is not visualized. No aortic stenosis is present.  6. Aortic dilatation noted. There is borderline dilatation of the aortic root, measuring 38 mm.  7. The inferior vena cava is normal in size with greater than 50% respiratory variability, suggesting right atrial pressure of 3 mmHg.  8. Technically difficult study with poor acoustic windows. FINDINGS  Left Ventricle: Left ventricular ejection fraction, by estimation, is 55 to 60%. The left ventricle has normal function. The left ventricle has  no regional wall motion abnormalities. The left ventricular internal cavity size was normal in size. There is  mild  concentric left ventricular hypertrophy. Left ventricular diastolic parameters are consistent with Grade II diastolic dysfunction (pseudonormalization). Right Ventricle: The right ventricular size is normal. No increase in right ventricular wall thickness. Right ventricular systolic function is normal. Left Atrium: Left atrial size was moderately dilated. Right Atrium: Right atrial size was normal in size. Pericardium: There is no evidence of pericardial effusion. Mitral Valve: The mitral valve is normal in structure. No evidence of mitral valve regurgitation. No evidence of mitral valve stenosis. Tricuspid Valve: The tricuspid valve is not well visualized. Tricuspid valve regurgitation is not demonstrated. Aortic Valve: The aortic valve was not well visualized. There is moderate calcification of the aortic valve. Aortic valve regurgitation is not visualized. No aortic stenosis is present. Aortic valve mean gradient measures 3.0 mmHg. Aortic valve peak gradient measures 5.5 mmHg. Aortic valve area, by VTI measures 1.60 cm. Pulmonic Valve: The pulmonic valve was normal in structure. Pulmonic valve regurgitation is not visualized. Aorta: Aortic dilatation noted. There is borderline dilatation of the aortic root, measuring 38 mm. Venous: The inferior vena cava is normal in size with greater than 50% respiratory variability, suggesting right atrial pressure of 3 mmHg. IAS/Shunts: No atrial level shunt detected by color flow Doppler.  LEFT VENTRICLE PLAX 2D LVIDd:         4.70 cm   Diastology LVIDs:         3.50 cm   LV e' medial:    5.77 cm/s LV PW:         1.00 cm   LV E/e' medial:  10.2 LV IVS:        1.10 cm   LV e' lateral:   8.59 cm/s LVOT diam:     2.10 cm   LV E/e' lateral: 6.9 LV SV:         41 LV SV Index:   20 LVOT Area:     3.46 cm  RIGHT VENTRICLE RV S prime:     12.70 cm/s  PULMONARY VEINS TAPSE (M-mode): 2.0 cm      Diastolic Velocity: 24.00 cm/s                             S/D Velocity:       0.70                              Systolic Velocity:  16.60 cm/s LEFT ATRIUM           Index        RIGHT ATRIUM           Index LA diam:      4.30 cm 2.09 cm/m   RA Area:     15.40 cm LA Vol (A4C): 85.7 ml 41.68 ml/m  RA Volume:   34.10 ml  16.59 ml/m  AORTIC VALVE AV Area (Vmax):    1.70 cm AV Area (Vmean):   1.51 cm AV Area (VTI):     1.60 cm AV Vmax:           117.00 cm/s AV Vmean:          82.100 cm/s AV VTI:            0.254 m AV Peak Grad:      5.5 mmHg AV Mean Grad:  3.0 mmHg LVOT Vmax:         57.40 cm/s LVOT Vmean:        35.800 cm/s LVOT VTI:          0.117 m LVOT/AV VTI ratio: 0.46  AORTA Ao Root diam: 3.80 cm Ao Asc diam:  3.50 cm MITRAL VALVE MV Area (PHT): 3.40 cm    SHUNTS MV Decel Time: 223 msec    Systemic VTI:  0.12 m MV E velocity: 59.10 cm/s  Systemic Diam: 2.10 cm MV A velocity: 54.80 cm/s MV E/A ratio:  1.08 Dalton McleanMD Electronically signed by Ezra Kanner Signature Date/Time: 03/29/2024/4:06:23 PM    Final (Updated)    CT Angio Chest Pulmonary Embolism (PE) W or WO Contrast Result Date: 03/28/2024 EXAM: CTA CHEST 03/28/2024 04:50:00 PM TECHNIQUE: CTA of the chest was performed after the administration of intravenous contrast. Multiplanar reformatted images are provided for review. MIP images are provided for review. Automated exposure control, iterative reconstruction, and/or weight based adjustment of the mA/kV was utilized to reduce the radiation dose to as low as reasonably achievable. 65 mL (iohexol  (OMNIPAQUE ) 350 MG/ML injection 75 mL IOHEXOL  350 MG/ML SOLN). COMPARISON: PET CT 02/02/2024. CLINICAL HISTORY: Pulmonary embolism (PE) suspected, low to intermediate prob, neg D-dimer. FINDINGS: PULMONARY ARTERIES: Pulmonary arteries are adequately opacified for evaluation. No acute pulmonary embolus. Main pulmonary artery is normal in caliber. 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 Moderate atherosclerotic plaque.  The pericardium demonstrates no acute abnormality. The thoracic aorta is normal in caliber. LYMPH NODES: No mediastinal, hilar or axillary lymphadenopathy. LUNGS AND PLEURA: Persistent similar bilateral patchy reticulations with superimposed ground-glass airspace opacities. Interval increase in size of approximately 6 cm right perihilar peribronchovascular consolidation but extends to the right lower and right upper lobes. Associated metallic density centrally measuring 4 mm. Trace right pleural effusion. No pneumothorax. UPPER ABDOMEN: A fluid-density lesion of the superior right renal pole likely represents a simple renal cyst. Simple renal cysts do not require additional follow-up unless clinically indicated due to signs/symptoms. SOFT TISSUES AND BONES: No acute bone or soft tissue abnormality. IMPRESSION: 1. No pulmonary embolism. 2. Interval increase of an approximately 6 cm right perihilar peribronchovascular consolidation/mass extending into the right upper and right lower lobes, with associated central 4 mm metallic density. 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 3. Persistent bilateral patchy reticulations with superimposed ground-glass opacities. 4. Persistent trace right pleural effusion. 5. Aortic valve leaflet calcification and at least 3 vessel coronary artery calcifications. Electronically signed by: Morgane Naveau MD 03/28/2024 07:00 PM EST RP Workstation: [id number]   DG Chest Port 1 View Result Date: 03/28/2024 EXAM: 1 VIEW(S) XRAY OF THE CHEST 03/28/2024 01:49:27 PM COMPARISON: 06/19/2022 CLINICAL HISTORY: shortness of breath FINDINGS: LUNGS AND PLEURA: Right hemithorax volume loss. Diffuse interstitial prominence which may reflect mild edema versus atypical infection. Asymmetric elevation of right hemidiaphragm. Right upper lung interstitial opacities, likely posttreatment changes. No pleural effusion. No pneumothorax. HEART AND MEDIASTINUM: No acute abnormality of the cardiac and  mediastinal silhouettes. BONES AND SOFT TISSUES: No acute osseous abnormality. IMPRESSION: 1. Right hemithorax volume loss and asymmetric elevation of right hemidiaphragm. Likely reflecting post-treatment changes. 2. Diffuse interstitial prominence, possibly representing mild edema or atypical infection. Electronically signed by: Waddell Calk MD 03/28/2024 02:49 PM EST RP Workstation: [id number]    Micro Results   Recent Results (from the past 240 hours)  Resp panel by RT-PCR (RSV, Flu A&B, Covid) Anterior Nasal Swab     Status:  None   Collection Time: 03/28/24  1:31 PM   Specimen: Anterior Nasal Swab  Result Value Ref Range Status   SARS Coronavirus 2 by RT PCR NEGATIVE NEGATIVE Final   Influenza A by PCR NEGATIVE NEGATIVE Final   Influenza B by PCR NEGATIVE NEGATIVE Final    Comment: (NOTE) The Xpert Xpress SARS-CoV-2/FLU/RSV plus assay is intended as an aid in the diagnosis of influenza from Nasopharyngeal swab specimens and should not be used as a sole basis for treatment. Nasal washings and aspirates are unacceptable for Xpert Xpress SARS-CoV-2/FLU/RSV testing.  Fact Sheet for Patients: bloggercourse.com  Fact Sheet for Healthcare Providers: seriousbroker.it  This test is not yet approved or cleared by the [place]  FDA and has been authorized for detection and/or diagnosis of SARS-CoV-2 by FDA under an Emergency Use Authorization (EUA). This EUA will remain in effect (meaning this test can be used) for the duration of the COVID-19 declaration under Section 564(b)(1) of the Act, 21 U.S.C. section 360bbb-3(b)(1), unless the authorization is terminated or revoked.     Resp Syncytial Virus by PCR NEGATIVE NEGATIVE Final    Comment: (NOTE) Fact Sheet for Patients: bloggercourse.com  Fact Sheet for Healthcare Providers: seriousbroker.it  This test is not yet approved or  cleared by the [place]  FDA and has been authorized for detection and/or diagnosis of SARS-CoV-2 by FDA under an Emergency Use Authorization (EUA). This EUA will remain in effect (meaning this test can be used) for the duration of the COVID-19 declaration under Section 564(b)(1) of the Act, 21 U.S.C. section 360bbb-3(b)(1), unless the authorization is terminated or revoked.  Performed at [facility], [address], [place], [place] [address]   Culture, blood (Routine X 2) w Reflex to ID Panel     Status: None   Collection Time: 03/28/24  1:31 PM   Specimen: BLOOD  Result Value Ref Range Status   Specimen Description BLOOD SITE NOT SPECIFIED  Final   Special Requests   Final    BOTTLES DRAWN AEROBIC AND ANAEROBIC Blood Culture adequate volume   Culture   Final    NO GROWTH 5 DAYS Performed at [facility], [address] [place], [place] [id number]    Report Status 04/02/2024 FINAL  Final  Blood Culture (routine x 2)     Status: None   Collection Time: 03/28/24  1:36 PM   Specimen: BLOOD  Result Value Ref Range Status   Specimen Description BLOOD SITE NOT SPECIFIED  Final   Special Requests   Final    BOTTLES DRAWN AEROBIC AND ANAEROBIC Blood Culture results may not be optimal due to an inadequate volume of blood received in culture bottles   Culture   Final    NO GROWTH 5 DAYS Performed at [facility], [address] [place], [place] [address]    Report Status 04/02/2024 FINAL  Final  Respiratory (~20 pathogens) panel by PCR     Status: Abnormal   Collection Time: 03/28/24  5:09 PM   Specimen: Nasopharyngeal Swab; Respiratory  Result Value Ref Range Status   Adenovirus NOT DETECTED NOT DETECTED Final   Coronavirus 229E NOT DETECTED NOT DETECTED Final    Comment: (NOTE) The Coronavirus on the Respiratory Panel, DOES NOT test for the novel  Coronavirus (2019 nCoV)    Coronavirus HKU1 NOT DETECTED NOT DETECTED Final   Coronavirus NL63 NOT DETECTED  NOT DETECTED Final   Coronavirus OC43 NOT DETECTED NOT DETECTED Final  Metapneumovirus NOT DETECTED NOT DETECTED Final   Rhinovirus / Enterovirus NOT DETECTED NOT DETECTED Final   Influenza A NOT DETECTED NOT DETECTED Final   Influenza B NOT DETECTED NOT DETECTED Final   Parainfluenza Virus 1 DETECTED (A) NOT DETECTED Final   Parainfluenza Virus 2 NOT DETECTED NOT DETECTED Final   Parainfluenza Virus 3 NOT DETECTED NOT DETECTED Final   Parainfluenza Virus 4 NOT DETECTED NOT DETECTED Final   Respiratory Syncytial Virus NOT DETECTED NOT DETECTED Final   Bordetella pertussis NOT DETECTED NOT DETECTED Final   Bordetella Parapertussis NOT DETECTED NOT DETECTED Final   Chlamydophila pneumoniae NOT DETECTED NOT DETECTED Final   Mycoplasma pneumoniae NOT DETECTED NOT DETECTED Final    Comment: Performed at [facility], [address], [place], [place] [address]  Expectorated Sputum Assessment w Gram Stain, Rflx to Resp Cult     Status: None (Preliminary result)   Collection Time: 04/01/24  5:12 PM   Specimen: Expectorated Sputum  Result Value Ref Range Status   Specimen Description EXPECTORATED SPUTUM  Final   Special Requests NONE  Final   Sputum evaluation   Final    THIS SPECIMEN IS ACCEPTABLE FOR SPUTUM CULTURE Performed at [facility], [address] [place], [place] [address]    Report Status PENDING  Incomplete  Culture, Respiratory w Gram Stain     Status: None   Collection Time: 04/01/24  5:12 PM  Result Value Ref Range Status   Specimen Description EXPECTORATED SPUTUM  Final   Special Requests NONE Reflexed from [id number]  Final   Gram Stain   Final    FEW WBC PRESENT, PREDOMINANTLY PMN FEW GRAM POSITIVE COCCI    Culture   Final    FEW Normal respiratory flora-no Staph aureus or Pseudomonas seen Performed at [facility], [address] [place], [place] [address]    Report Status 04/04/2024 FINAL  Final    Today   Subjective    Peter Greene  today has no headache,no chest abdominal pain,no new weakness tingling or numbness, feels much better wants to go home today.    Objective   Blood pressure 128/88, pulse (!) 54, temperature 98.6 F (37 C), temperature source Oral, resp. rate 12, height 5' 8 (1.727 m), weight 92 kg, SpO2 98%.   Intake/Output Summary (Last 24 hours) at 04/05/2024 0753 Last data filed at 04/05/2024 0600 Gross per 24 hour  Intake 360 ml  Output 1150 ml  Net -790 ml    Exam  Awake Alert, No new F.N deficits,    [place].AT,PERRAL Supple Neck,   Symmetrical Chest wall movement, Good air movement bilaterally, CTAB RRR,No Gallops,   +ve B.Sounds, Abd Soft, Non tender,  No Cyanosis, Clubbing or edema    Data Review   Recent Labs  Lab 03/31/24 0358 04/01/24 0435 04/02/24 0622 04/03/24 0512 04/05/24 0308  WBC 6.9 9.5 7.8 7.0 8.5  HGB 10.0* 10.3* 10.7* 12.2* 11.4*  HCT 30.3* 31.7* 32.7* 37.1* 34.3*  PLT 220 230 260 304 334  MCV 92.4 91.6 92.6 92.5 91.5  MCH 30.5 29.8 30.3 30.4 30.4  MCHC 33.0 32.5 32.7 32.9 33.2  RDW 15.9* 15.4 15.6* 15.5 15.9*  LYMPHSABS 1.0 1.7 2.6 2.0 3.0  MONOABS 0.6 0.7 0.8 0.7 1.0  EOSABS 0.0 0.0 0.0 0.0 0.0  BASOSABS 0.0 0.0 0.0 0.0 0.0    Recent Labs  Lab 03/30/24 0401 03/31/24 0358 04/01/24 0435 04/02/24 0622 04/03/24 0512 04/05/24 0308  NA  139 138 137 138 138 138  K 4.1 4.6 4.5 4.6 5.0 4.2  CL 105 106 104 103 103 101  CO2 23 21* 23 25 25 28   ANIONGAP 11 11 11 11 10 9   GLUCOSE 103* 114* 100* 81 114* 112*  BUN 35* 31* 28* 35* 30* 41*  CREATININE 1.00 0.93 0.78 0.96 0.90 0.97  CRP 4.6* 2.7* 1.3* 1.4*  --   --   PROCALCITON 0.28 0.11 <0.10 <0.10  --   --   MG 2.2 2.3 2.5* 2.5*  --   --   CALCIUM  8.6* 8.5* 8.7* 8.9 9.0 9.0    Total Time in preparing paper work, data evaluation and todays exam - 35 minutes  Signature  -    Lavada Stank M.D on 04/05/2024 at 7:53 AM   -  To page go to [email or web address]      "

## 2024-04-06 ENCOUNTER — Telehealth: Payer: Self-pay | Admitting: *Deleted

## 2024-04-06 NOTE — Telephone Encounter (Signed)
 RETURNED PATIENT'S PHONE CALL, LVM FOR A RETURN CALL

## 2024-04-07 ENCOUNTER — Telehealth: Payer: Self-pay | Admitting: *Deleted

## 2024-04-07 ENCOUNTER — Other Ambulatory Visit: Payer: Self-pay | Admitting: Urology

## 2024-04-07 LAB — EXPECTORATED SPUTUM ASSESSMENT W GRAM STAIN, RFLX TO RESP C

## 2024-04-07 NOTE — Telephone Encounter (Signed)
 Returned patient's wife's phone call, spoke with Ms. Bomar

## 2024-04-11 ENCOUNTER — Ambulatory Visit: Admitting: Physical Therapy

## 2024-04-13 ENCOUNTER — Other Ambulatory Visit: Payer: Self-pay | Admitting: Urology

## 2024-04-13 DIAGNOSIS — C61 Malignant neoplasm of prostate: Secondary | ICD-10-CM

## 2024-04-20 ENCOUNTER — Encounter: Payer: Self-pay | Admitting: Pulmonary Disease

## 2024-04-20 ENCOUNTER — Ambulatory Visit: Admitting: Pulmonary Disease

## 2024-04-20 ENCOUNTER — Ambulatory Visit

## 2024-04-20 VITALS — BP 82/52 | HR 80 | Ht 68.0 in | Wt 202.0 lb

## 2024-04-20 DIAGNOSIS — R911 Solitary pulmonary nodule: Secondary | ICD-10-CM | POA: Diagnosis not present

## 2024-04-20 DIAGNOSIS — J849 Interstitial pulmonary disease, unspecified: Secondary | ICD-10-CM

## 2024-04-20 DIAGNOSIS — J189 Pneumonia, unspecified organism: Secondary | ICD-10-CM

## 2024-04-20 DIAGNOSIS — Z8709 Personal history of other diseases of the respiratory system: Secondary | ICD-10-CM

## 2024-04-20 DIAGNOSIS — Z87891 Personal history of nicotine dependence: Secondary | ICD-10-CM

## 2024-04-20 DIAGNOSIS — R9389 Abnormal findings on diagnostic imaging of other specified body structures: Secondary | ICD-10-CM

## 2024-04-20 MED ORDER — PREDNISONE 10 MG PO TABS: 40.0000 mg | ORAL_TABLET | Freq: Every day | ORAL | 0 refills | Status: DC

## 2024-04-20 NOTE — Progress Notes (Signed)
 SABRA

## 2024-04-20 NOTE — Patient Instructions (Signed)
" °  VISIT SUMMARY: During your visit, we addressed your worsening respiratory symptoms, recent pneumonia, and ongoing interstitial lung disease. We also discussed your recent imaging findings and upcoming prostate cancer treatment.  YOUR PLAN: PNEUMONIA: You have persistent symptoms from your recent pneumonia, including low oxygen levels and weakness. -We ordered a chest x-ray to check for any remaining pneumonia or inflammation. -We restarted prednisone  at 40 mg daily with a taper of 10 mg per week. -Continue using oxygen therapy as needed to maintain adequate oxygen levels.  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 It has recently worsened, possibly due to pneumonia and stopping prednisone . -Continue the prednisone  taper as outlined for pneumonia management.  RIGHT HILAR PULMONARY NODULE: You have a right hilar pulmonary nodule that was treated with SBRT and is under surveillance. Recent imaging shows a slight increase in size, possibly due to pneumonia or inflammation. -Continue with the scheduled follow-up CT scan on March 4th, 2026. -Monitor for any new symptoms or changes in condition.   Contains text generated by [employer]bridge.   "

## 2024-04-20 NOTE — Progress Notes (Signed)
 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 with history of laryngeal cancer, atrial fibrillation, myelodysplastic syndrome.  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  Used to follow with Dr.Thomas J. Tad Jungling, pulmonary in [place]  and was told that he had idiopathic pneumonia treated with a prolonged steroid taper.  Reviewed notes from Dr. Jungling who did not have much details on the note but he is being treated for idiopathic pneumonia with prolonged steroid taper.  There is no mention of biopsy. He appears to have had surgical lung biopsy in the past but the patient does not recall the date of the procedure.  He is currently not on any specific treatments for interstitial lung disease and transferred care in 2023 as he moved [place] to be closer to his family.  Noted to have enlarging right lower lobe nodule with positive PET signal Underwent navigational bronchoscopy twice in January and March 2024 to which unfortunately was non diagnostic.  Case has been discussed at tumor board and evaluated by cardiothoracic surgery, radiation oncology.  He eventually underwent empiric SBRT to that area in June 2024.  Has multiple episodes of pneumonia in 2025 requiring outpatient antibiotic therapy.  Interim history: Discussed the use of AI scribe software for clinical note transcription with the patient, who gave verbal consent to proceed.  History of Present Illness Peter Greene is an 84 year old male with interstitial  lung disease who presents with worsening respiratory symptoms.  Dyspnea and hypoxemia - Acute worsening of dyspnea and decreased home oxygen saturations today, with readings dropping below prior baseline of 93-97% - Increased weakness accompanying hypoxemia - Oxygen saturation drops further with exertion - Requires supplemental oxygen as needed and monitors saturations at home  Recent parainfluenza pneumonia and hospitalization - Hospitalized the week after Christmas 2025 for parainfluenza pneumonia - Received antibiotics, oxygen, and a prednisone  taper during hospitalization - Discharged on home oxygen and prednisone  taper, which was completed yesterday - No fevers or chills since discharge  Interstitial lung disease and pulmonary fibrosis - Interstitial lung disease present for approximately ten years, attributed to post-radiation fibrosis or graft versus host disease - Previously responded to prednisone  therapy - Recent prednisone  course completed  Pulmonary nodules and imaging findings - Right lung nodule treated with SBRT in May 2024 - Follow-up imaging shows right perihilar opacity changes possibly representing pneumonia versus inflammation  Upper respiratory symptoms - Ongoing drainage and hoarseness since recent infection - Uses Mucinex  and nasal spray for symptom management  Prostate cancer and upcoming treatment - New diagnosis of prostate cancer - Scheduled for seed implantation and radiation starting May 06, 2024 - Potential impact on overall condition and treatment tolerance   Relevant pulmonary history Pets: No pets Occupation: Retired firefighter Exposures: No mold exposures.  No mold, hot tub, Jacuzzi.  No feather pillows or comforters ILD questionnaire 08/20/2021-negative Smoking history: 20-pack-year smoker.  Quit in 1984 Travel history: Used to live in [place] , [place] .  No significant recent travel Relevant family history:  No family history of lung  disease  Outpatient Encounter Medications as of 04/20/2024  Medication Sig   albuterol  (VENTOLIN  HFA) 108 (90 Base) MCG/ACT inhaler Inhale 2 puffs into the lungs every 6 (six) hours as needed for wheezing or shortness of breath.   aspirin  EC 81 MG tablet Take 81 mg by mouth in the morning. Swallow whole.   atorvastatin  (LIPITOR) 40 MG tablet Take 40 mg by mouth in the morning.   ELIQUIS  5 MG TABS tablet Take 5 mg by mouth 2 (two) times daily.   fluticasone  (FLONASE ) 50 MCG/ACT nasal spray Place 1 spray into both nostrils daily.   gabapentin  (NEURONTIN ) 100 MG capsule Take 100 mg by mouth as needed.   guaiFENesin  (MUCINEX ) 600 MG 12 hr tablet Take 1 tablet (600 mg total) by mouth 2 (two) times daily.   HYDROcodone  bit-homatropine (HYCODAN) 5-1.5 MG/5ML syrup Take 5 mLs by mouth every 6 (six) hours. (Patient taking differently: Take 5 mLs by mouth every 6 (six) hours. prn)   ipratropium (ATROVENT) 0.03 % nasal spray Place 2 sprays into both nostrils daily.   Krill Oil 350 MG CAPS Take 350 mg by mouth in the morning and at bedtime.   levothyroxine  (SYNTHROID ) 50 MCG tablet Take 50 mcg by mouth daily.   Melatonin 10 MG CAPS Take 10 mg by mouth as needed.   metoprolol  tartrate (LOPRESSOR ) 25 MG tablet Take 25 mg by mouth 2 (two) times daily.   Multiple Vitamin (MULTIVITAMIN WITH MINERALS) TABS tablet Take 1 tablet by mouth in the morning. Centrum   Omega-3 Fatty Acids (OMEGA 3 PO) Take 1 tablet by mouth in the morning and at bedtime.   pantoprazole  (PROTONIX ) 20 MG tablet Take 20 mg by mouth every evening.   relugolix  (ORGOVYX ) 120 MG tablet Take 120 mg by mouth daily.   tamsulosin  (FLOMAX ) 0.4 MG CAPS capsule Take 0.4 mg by mouth in the morning.   valACYclovir  (VALTREX ) 500 MG tablet Take 500 mg by mouth in the morning.   [EXPIRED] predniSONE  (DELTASONE ) 5 MG tablet Take 8 tablets (40 mg total) by mouth daily for 3 days, THEN 6 tablets (30 mg total) daily for 3 days, THEN 4 tablets (20 mg total)  daily for 2 days, THEN 2 tablets (10 mg total) daily for 2 days, THEN 1 tablet (5 mg total) daily for 2 days, THEN 0.5 tablets (2.5 mg total) daily for 2 days. Then stop. (Patient not taking: No sig reported)   sildenafil (REVATIO) 20 MG tablet SMARTSIG:3-5 Tablet(s) By Mouth PRN (Patient not taking: Reported on 04/20/2024)   No facility-administered encounter medications on file as of 04/20/2024.   Vitals:   04/20/24 1029  BP: (!) 82/52  Pulse: 80  Height: 5' 8 (1.727 m)  Weight: 202 lb (91.6 kg)  SpO2: (!) 85%  TempSrc: Oral  BMI (Calculated): 30.72    Physical Exam GEN: No acute distress. CV: Regular rate and rhythm, no murmurs. LUNGS: Few crackles, very tiny, otherwise clear to auscultation bilaterally with normal respiratory effort. SKIN JOINTS: Warm and dry, no rash.    Data Reviewed: Imaging: CT chest 08/11/2021-multifocal areas of consolidation mostly in the right middle lobe and lower lobes.  Heterogeneous peripheral reticulation.    High resolution CT 02/25/2022-upper lobe reticular groundglass opacities with honeycomb changes, alternate diagnosis.  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  PET scan 03/23/2022-hypermetabolic right lower lobe nodule  CT scan 06/05/2022-enlargement of spiculated right lower lobe nodule, stable scarring in the lungs.  CT scan 09/09/2022-decrease in size  of solid right lower lobe pulmonary nodule.  Stable nodule in the left upper lobe.  Right upper lobe peribronchovascular reticular changes  CT chest 12/10/2022-right upper lobe lobe and superior right lower lobe interstitial opacities suggestive of postradiation changes.  CT chest 02/02/2023-Findings appear stable compared to September 2024  CT chest 11/12/2023-new ground glass nodule in right lower lobe.  Stable right perihilar consolidation, architectural distortion, bronchiectasis.  Small right pleural effusion.  CTA 04/02/2024-no pulmonary embolism.  Significant increased patchy airspace opacities bilaterally.   3.6 right perihilar opacity I have reviewed the images below.  PFTs: 01/01/2022 FVC 2.54 [6 7%], FEV1 2.19 [81%], F/F 86, TLC 4.16 [62%], DLCO 13.02 [56%]  06/11/2022 FVC 2.75 [73%], FEV1 2.35 [88%], F/F85, TLC 5.13 [77%], DLCO 13.62 [59%] Moderate restriction, diffusion defect.  12/29/2023 FVC 2.42 [65%], FEV1 2.03 [77%], F/F84, TLC 4.81 [72%], DLCO 12.68 [55%] Mild restriction, moderate diffusion defect  Labs: CTD serologies 06/28/2022-significant for rheumatoid factor 16, hypersensitive panel- negative Assessment & Plan Pneumonia, recent with persistent symptoms Recent pneumonia after parainfluenza infection with persistent symptoms, including low oxygen levels and weakness. Completed a course of antibiotics and prednisone . Oxygen levels have been stable but are currently low. No fever or chills. Possible contribution from discontinuation of prednisone . Persistent symptoms may be due to inflammation or residual effects of pneumonia. - Ordered chest x-ray to assess for residual pneumonia or inflammation. - Restarted prednisone  at 40 mg daily with a taper of 10 mg per week. - Resume oxygen therapy as needed to maintain adequate oxygen levels.  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  Treated with prolonged prednisone  taper around 2015 and it is a long-standing condition with recent exacerbation possibly related to pneumonia and discontinuation of prednisone . - Continue prednisone  taper as outlined for pneumonia management.  Right lower lobe pulmonary nodule, post-empiric SBRT, under surveillance Worsening right perihilar opacity. Right pulmonary nodule post-SBRT, under surveillance. Recent CT shows slight increase in size right perihilar opacity, possibly due to pneumonia or inflammation. Follow-up scan  scheduled for March 4th, 2026. No immediate need for earlier imaging as recent hospitalization may affect results. - Continue with scheduled follow-up CT scan on March 4th, 2026.  Ordered by radiation oncology - Monitor for any new symptoms or changes in condition.  Plan/Recommendations: Chest x-ray, prolonged prednisone  taper Follow-up CT as scheduled in March Follow-up in 3 months  I personally spent a total of 41 minutes in the care of the patient today including preparing to see the patient, getting/reviewing separately obtained history, counseling and educating, referring and communicating with other health care professionals, documenting clinical information in the EHR, independently interpreting results, and communicating results.   Shawntell Dixson MD [facility] 04/20/2024, 10:38 AM  CC: [name], MD "

## 2024-04-22 ENCOUNTER — Emergency Department (HOSPITAL_COMMUNITY)

## 2024-04-22 ENCOUNTER — Encounter (HOSPITAL_COMMUNITY): Payer: Self-pay | Admitting: *Deleted

## 2024-04-22 ENCOUNTER — Inpatient Hospital Stay (HOSPITAL_COMMUNITY): Admission: EM | Admit: 2024-04-22 | Discharge: 2024-05-01 | DRG: 871 | Disposition: E

## 2024-04-22 ENCOUNTER — Other Ambulatory Visit: Payer: Self-pay

## 2024-04-22 DIAGNOSIS — Z8521 Personal history of malignant neoplasm of larynx: Secondary | ICD-10-CM

## 2024-04-22 DIAGNOSIS — I714 Abdominal aortic aneurysm, without rupture, unspecified: Secondary | ICD-10-CM | POA: Diagnosis present

## 2024-04-22 DIAGNOSIS — J9601 Acute respiratory failure with hypoxia: Secondary | ICD-10-CM | POA: Diagnosis not present

## 2024-04-22 DIAGNOSIS — J849 Interstitial pulmonary disease, unspecified: Secondary | ICD-10-CM | POA: Diagnosis present

## 2024-04-22 DIAGNOSIS — D649 Anemia, unspecified: Secondary | ICD-10-CM | POA: Diagnosis not present

## 2024-04-22 DIAGNOSIS — R6521 Severe sepsis with septic shock: Secondary | ICD-10-CM | POA: Diagnosis present

## 2024-04-22 DIAGNOSIS — J189 Pneumonia, unspecified organism: Secondary | ICD-10-CM | POA: Diagnosis present

## 2024-04-22 DIAGNOSIS — I4891 Unspecified atrial fibrillation: Secondary | ICD-10-CM | POA: Diagnosis present

## 2024-04-22 DIAGNOSIS — E785 Hyperlipidemia, unspecified: Secondary | ICD-10-CM | POA: Diagnosis not present

## 2024-04-22 DIAGNOSIS — R570 Cardiogenic shock: Secondary | ICD-10-CM | POA: Diagnosis not present

## 2024-04-22 DIAGNOSIS — Z7901 Long term (current) use of anticoagulants: Secondary | ICD-10-CM

## 2024-04-22 DIAGNOSIS — I2489 Other forms of acute ischemic heart disease: Secondary | ICD-10-CM | POA: Diagnosis present

## 2024-04-22 DIAGNOSIS — G8929 Other chronic pain: Secondary | ICD-10-CM | POA: Diagnosis not present

## 2024-04-22 DIAGNOSIS — K7200 Acute and subacute hepatic failure without coma: Secondary | ICD-10-CM | POA: Diagnosis present

## 2024-04-22 DIAGNOSIS — Z79899 Other long term (current) drug therapy: Secondary | ICD-10-CM

## 2024-04-22 DIAGNOSIS — N179 Acute kidney failure, unspecified: Secondary | ICD-10-CM | POA: Diagnosis present

## 2024-04-22 DIAGNOSIS — Z85118 Personal history of other malignant neoplasm of bronchus and lung: Secondary | ICD-10-CM

## 2024-04-22 DIAGNOSIS — K219 Gastro-esophageal reflux disease without esophagitis: Secondary | ICD-10-CM | POA: Diagnosis not present

## 2024-04-22 DIAGNOSIS — A419 Sepsis, unspecified organism: Principal | ICD-10-CM | POA: Diagnosis present

## 2024-04-22 DIAGNOSIS — R7989 Other specified abnormal findings of blood chemistry: Secondary | ICD-10-CM

## 2024-04-22 DIAGNOSIS — Z7982 Long term (current) use of aspirin: Secondary | ICD-10-CM

## 2024-04-22 DIAGNOSIS — R042 Hemoptysis: Secondary | ICD-10-CM | POA: Diagnosis present

## 2024-04-22 DIAGNOSIS — Z8546 Personal history of malignant neoplasm of prostate: Secondary | ICD-10-CM | POA: Diagnosis not present

## 2024-04-22 DIAGNOSIS — E875 Hyperkalemia: Secondary | ICD-10-CM | POA: Diagnosis present

## 2024-04-22 DIAGNOSIS — Z79890 Hormone replacement therapy (postmenopausal): Secondary | ICD-10-CM

## 2024-04-22 DIAGNOSIS — Z66 Do not resuscitate: Secondary | ICD-10-CM | POA: Diagnosis not present

## 2024-04-22 DIAGNOSIS — E874 Mixed disorder of acid-base balance: Secondary | ICD-10-CM | POA: Diagnosis present

## 2024-04-22 DIAGNOSIS — Z85828 Personal history of other malignant neoplasm of skin: Secondary | ICD-10-CM

## 2024-04-22 DIAGNOSIS — E039 Hypothyroidism, unspecified: Secondary | ICD-10-CM | POA: Diagnosis present

## 2024-04-22 DIAGNOSIS — Z87891 Personal history of nicotine dependence: Secondary | ICD-10-CM

## 2024-04-22 LAB — HEMOGLOBIN A1C
Hgb A1c MFr Bld: 6.1 % — ABNORMAL HIGH (ref 4.8–5.6)
Mean Plasma Glucose: 128.37 mg/dL

## 2024-04-22 LAB — RESPIRATORY PANEL BY PCR

## 2024-04-22 LAB — URINALYSIS, W/ REFLEX TO CULTURE (INFECTION SUSPECTED)
Bacteria, UA: NONE SEEN
Bilirubin Urine: NEGATIVE
Glucose, UA: NEGATIVE mg/dL
Hgb urine dipstick: NEGATIVE
Ketones, ur: NEGATIVE mg/dL
Leukocytes,Ua: NEGATIVE
Nitrite: NEGATIVE
Protein, ur: 100 mg/dL — AB
Specific Gravity, Urine: 1.021 (ref 1.005–1.030)
pH: 5 (ref 5.0–8.0)

## 2024-04-22 LAB — CBC WITH DIFFERENTIAL/PLATELET
Abs Immature Granulocytes: 0.04 K/uL (ref 0.00–0.07)
Basophils Absolute: 0 K/uL (ref 0.0–0.1)
Basophils Relative: 0 %
Eosinophils Absolute: 0.1 K/uL (ref 0.0–0.5)
Eosinophils Relative: 1 %
HCT: 32.4 % — ABNORMAL LOW (ref 39.0–52.0)
Hemoglobin: 10.5 g/dL — ABNORMAL LOW (ref 13.0–17.0)
Immature Granulocytes: 0 %
Lymphocytes Relative: 25 %
Lymphs Abs: 2.3 K/uL (ref 0.7–4.0)
MCH: 30.3 pg (ref 26.0–34.0)
MCHC: 32.4 g/dL (ref 30.0–36.0)
MCV: 93.4 fL (ref 80.0–100.0)
Monocytes Absolute: 0.7 K/uL (ref 0.1–1.0)
Monocytes Relative: 8 %
Neutro Abs: 6.1 K/uL (ref 1.7–7.7)
Neutrophils Relative %: 66 %
Platelets: 282 K/uL (ref 150–400)
RBC: 3.47 MIL/uL — ABNORMAL LOW (ref 4.22–5.81)
RDW: 17.5 % — ABNORMAL HIGH (ref 11.5–15.5)
WBC: 9.2 K/uL (ref 4.0–10.5)
nRBC: 0.4 % — ABNORMAL HIGH (ref 0.0–0.2)

## 2024-04-22 LAB — COMPREHENSIVE METABOLIC PANEL WITH GFR
ALT: 36 U/L (ref 0–44)
AST: 34 U/L (ref 15–41)
Albumin: 4.1 g/dL (ref 3.5–5.0)
Alkaline Phosphatase: 85 U/L (ref 38–126)
Anion gap: 14 (ref 5–15)
BUN: 25 mg/dL — ABNORMAL HIGH (ref 8–23)
CO2: 21 mmol/L — ABNORMAL LOW (ref 22–32)
Calcium: 8.8 mg/dL — ABNORMAL LOW (ref 8.9–10.3)
Chloride: 100 mmol/L (ref 98–111)
Creatinine, Ser: 0.98 mg/dL (ref 0.61–1.24)
GFR, Estimated: >60 mL/min
Glucose, Bld: 139 mg/dL — ABNORMAL HIGH (ref 70–99)
Potassium: 4.2 mmol/L (ref 3.5–5.1)
Sodium: 135 mmol/L (ref 135–145)
Total Bilirubin: 0.4 mg/dL (ref 0.0–1.2)
Total Protein: 7.4 g/dL (ref 6.5–8.1)

## 2024-04-22 LAB — TROPONIN T, HIGH SENSITIVITY
Troponin T High Sensitivity: 32 ng/L — ABNORMAL HIGH (ref 0–19)
Troponin T High Sensitivity: 46 ng/L — ABNORMAL HIGH (ref 0–19)

## 2024-04-22 LAB — PROTIME-INR
INR: 1.3 — ABNORMAL HIGH (ref 0.8–1.2)
Prothrombin Time: 17 s — ABNORMAL HIGH (ref 11.4–15.2)

## 2024-04-22 LAB — MRSA NEXT GEN BY PCR, NASAL: MRSA by PCR Next Gen: NOT DETECTED

## 2024-04-22 LAB — GLUCOSE, CAPILLARY: Glucose-Capillary: 128 mg/dL — ABNORMAL HIGH (ref 70–99)

## 2024-04-22 LAB — I-STAT CG4 LACTIC ACID, ED
Lactic Acid, Venous: 3.2 mmol/L (ref 0.5–1.9)
Lactic Acid, Venous: 1.8 mmol/L (ref 0.5–1.9)

## 2024-04-22 LAB — STREP PNEUMONIAE URINARY ANTIGEN: Strep Pneumo Urinary Antigen: NEGATIVE

## 2024-04-22 LAB — PRO BRAIN NATRIURETIC PEPTIDE: Pro Brain Natriuretic Peptide: 1673 pg/mL — ABNORMAL HIGH

## 2024-04-22 MED ORDER — DEXMEDETOMIDINE HCL IN NACL 400 MCG/100ML IV SOLN
0.0000 ug/kg/h | INTRAVENOUS | Status: DC
  Administered 2024-04-22 – 2024-04-23 (×2): 0.4 ug/kg/h via INTRAVENOUS
  Administered 2024-04-23: 0.8 ug/kg/h via INTRAVENOUS
  Administered 2024-04-23: 0.5 ug/kg/h via INTRAVENOUS
  Administered 2024-04-24: 1.2 ug/kg/h via INTRAVENOUS
  Filled 2024-04-22 (×5): qty 100

## 2024-04-22 MED ORDER — VANCOMYCIN HCL 1750 MG/350ML IV SOLN
1750.0000 mg | INTRAVENOUS | Status: DC
  Administered 2024-04-23: 1750 mg via INTRAVENOUS
  Filled 2024-04-22: qty 350

## 2024-04-22 MED ORDER — LEVOTHYROXINE SODIUM 50 MCG PO TABS: 50.0000 ug | ORAL_TABLET | Freq: Every day | ORAL | Status: DC

## 2024-04-22 MED ORDER — PANTOPRAZOLE SODIUM 20 MG PO TBEC: 20.0000 mg | DELAYED_RELEASE_TABLET | Freq: Every evening | ORAL | Status: DC

## 2024-04-22 MED ORDER — LACTATED RINGERS IV BOLUS: 1000.0000 mL | Freq: Once | INTRAVENOUS | Status: DC

## 2024-04-22 MED ORDER — ONDANSETRON HCL 4 MG PO TABS: 4.0000 mg | ORAL_TABLET | Freq: Four times a day (QID) | ORAL | Status: DC | PRN

## 2024-04-22 MED ORDER — ONDANSETRON HCL 4 MG/2ML IJ SOLN: 4.0000 mg | Freq: Four times a day (QID) | INTRAMUSCULAR | Status: DC | PRN

## 2024-04-22 MED ORDER — MELATONIN 5 MG PO TABS: 10.0000 mg | ORAL_TABLET | ORAL | Status: DC | PRN

## 2024-04-22 MED ORDER — ATORVASTATIN CALCIUM 40 MG PO TABS: 40.0000 mg | ORAL_TABLET | Freq: Every morning | ORAL | Status: DC

## 2024-04-22 MED ORDER — METOPROLOL TARTRATE 25 MG PO TABS
25.0000 mg | ORAL_TABLET | Freq: Two times a day (BID) | ORAL | Status: DC
  Administered 2024-04-22: 25 mg via ORAL
  Filled 2024-04-22: qty 1

## 2024-04-22 MED ORDER — ALPRAZOLAM 0.25 MG PO TABS
0.2500 mg | ORAL_TABLET | Freq: Once | ORAL | Status: AC
  Administered 2024-04-22: 0.25 mg via ORAL
  Filled 2024-04-22: qty 1

## 2024-04-22 MED ORDER — VALACYCLOVIR HCL 500 MG PO TABS
500.0000 mg | ORAL_TABLET | Freq: Every morning | ORAL | Status: DC
  Filled 2024-04-22 (×3): qty 1

## 2024-04-22 MED ORDER — ADULT MULTIVITAMIN W/MINERALS CH
1.0000 | ORAL_TABLET | Freq: Every morning | ORAL | Status: DC
  Administered 2024-04-22: 1 via ORAL
  Filled 2024-04-22: qty 1

## 2024-04-22 MED ORDER — DEXTROSE-SODIUM CHLORIDE 5-0.45 % IV SOLN: INTRAVENOUS | Status: AC

## 2024-04-22 MED ORDER — PIPERACILLIN-TAZOBACTAM 3.375 G IVPB 30 MIN
3.3750 g | Freq: Once | INTRAVENOUS | Status: AC
  Administered 2024-04-22: 3.375 g via INTRAVENOUS
  Filled 2024-04-22: qty 50

## 2024-04-22 MED ORDER — LACTATED RINGERS IV BOLUS
1000.0000 mL | Freq: Once | INTRAVENOUS | Status: AC
  Administered 2024-04-22: 1000 mL via INTRAVENOUS

## 2024-04-22 MED ORDER — IOHEXOL 350 MG/ML SOLN
75.0000 mL | Freq: Once | INTRAVENOUS | Status: AC | PRN
  Administered 2024-04-22: 75 mL via INTRAVENOUS

## 2024-04-22 MED ORDER — GUAIFENESIN ER 600 MG PO TB12: 600.0000 mg | ORAL_TABLET | Freq: Two times a day (BID) | ORAL | Status: DC

## 2024-04-22 MED ORDER — FUROSEMIDE 10 MG/ML IJ SOLN
40.0000 mg | Freq: Once | INTRAMUSCULAR | Status: AC
  Administered 2024-04-22: 40 mg via INTRAVENOUS
  Filled 2024-04-22: qty 4

## 2024-04-22 MED ORDER — FLUTICASONE PROPIONATE 50 MCG/ACT NA SUSP
1.0000 | Freq: Every day | NASAL | Status: DC
  Filled 2024-04-22: qty 16

## 2024-04-22 MED ORDER — ACETAMINOPHEN 650 MG RE SUPP: 650.0000 mg | Freq: Four times a day (QID) | RECTAL | Status: DC | PRN

## 2024-04-22 MED ORDER — POLYETHYLENE GLYCOL 3350 17 G PO PACK: 17.0000 g | PACK | Freq: Every day | ORAL | Status: DC | PRN

## 2024-04-22 MED ORDER — ACETAMINOPHEN 325 MG PO TABS
650.0000 mg | ORAL_TABLET | Freq: Four times a day (QID) | ORAL | Status: DC | PRN
  Administered 2024-04-22: 650 mg via ORAL
  Filled 2024-04-22: qty 2

## 2024-04-22 MED ORDER — VANCOMYCIN HCL 2000 MG/400ML IV SOLN
2000.0000 mg | Freq: Once | INTRAVENOUS | Status: AC
  Administered 2024-04-22: 2000 mg via INTRAVENOUS
  Filled 2024-04-22: qty 400

## 2024-04-22 MED ORDER — TAMSULOSIN HCL 0.4 MG PO CAPS
0.4000 mg | ORAL_CAPSULE | Freq: Every morning | ORAL | Status: DC
  Administered 2024-04-22: 0.4 mg via ORAL
  Filled 2024-04-22: qty 1

## 2024-04-22 MED ORDER — METHYLPREDNISOLONE SODIUM SUCC 125 MG IJ SOLR
125.0000 mg | Freq: Four times a day (QID) | INTRAMUSCULAR | Status: DC
  Administered 2024-04-22 – 2024-04-23 (×7): 125 mg via INTRAVENOUS
  Filled 2024-04-22 (×7): qty 2

## 2024-04-22 MED ORDER — SODIUM CHLORIDE 0.9 % IV SOLN
2.0000 g | Freq: Once | INTRAVENOUS | Status: AC
  Administered 2024-04-22: 2 g via INTRAVENOUS
  Filled 2024-04-22: qty 20

## 2024-04-22 MED ORDER — SENNA 8.6 MG PO TABS: 1.0000 | ORAL_TABLET | Freq: Two times a day (BID) | ORAL | Status: DC | PRN

## 2024-04-22 MED ORDER — PANTOPRAZOLE SODIUM 40 MG PO TBEC
40.0000 mg | DELAYED_RELEASE_TABLET | Freq: Every day | ORAL | Status: DC
  Administered 2024-04-22: 40 mg via ORAL
  Filled 2024-04-22: qty 1

## 2024-04-22 MED ORDER — CHLORHEXIDINE GLUCONATE CLOTH 2 % EX PADS
6.0000 | MEDICATED_PAD | Freq: Every day | CUTANEOUS | Status: DC
  Administered 2024-04-22 – 2024-04-25 (×4): 6 via TOPICAL

## 2024-04-22 MED ORDER — PIPERACILLIN-TAZOBACTAM 3.375 G IVPB
3.3750 g | Freq: Three times a day (TID) | INTRAVENOUS | Status: DC
  Administered 2024-04-22 – 2024-04-23 (×4): 3.375 g via INTRAVENOUS
  Filled 2024-04-22 (×4): qty 50

## 2024-04-22 MED ORDER — ASPIRIN 81 MG PO TBEC
81.0000 mg | DELAYED_RELEASE_TABLET | Freq: Every morning | ORAL | Status: DC
  Administered 2024-04-22: 81 mg via ORAL
  Filled 2024-04-22: qty 1

## 2024-04-22 MED ORDER — SODIUM CHLORIDE 0.9 % IV SOLN
500.0000 mg | Freq: Once | INTRAVENOUS | Status: DC
  Administered 2024-04-22: 500 mg via INTRAVENOUS
  Filled 2024-04-22: qty 5

## 2024-04-22 MED ORDER — GABAPENTIN 300 MG PO CAPS: 300.0000 mg | ORAL_CAPSULE | ORAL | Status: DC | PRN

## 2024-04-22 NOTE — Progress Notes (Signed)
 Pt transported from ED to 45M on Servo BiPAP w/o complications.

## 2024-04-22 NOTE — H&P (Addendum)
 "   NAME:  Peter Greene, MRN:  [id number], DOB:  May 24, 1940, LOS: 0 ADMISSION DATE:  04/22/2024, CONSULTATION DATE:  04/22/2024  REFERRING MD:  Triad MD and EDP, CHIEF COMPLAINT:  Acute respiratory failure recurrent    History of Present Illness:-Discussion with the wife and also patient and review of the external medical records.  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  This baseline has been quite functional without requiring any oxygen and is followed in pulmonary clinic with Dr. [name].  September 2025 FVC 2.4 L or 65% and DLCO 12.7/55% and largely stable compared to 2023.  He also has grade 2 diastolic dysfunction.  Admitted 03/28/2024 through 04/05/2024 with parainfluenza virus 3 that based on chart review and CT review course worsening respiratory failure requiring oxygen at discharge and a prolonged prednisone  taper.  Echo ef 55% baselinebut GRADe 2 DDX According to the wife he recovered from this and came off oxygen at home.  He was undergoing home physical therapy and doing pretty well.  He came off his prednisone  taper on 04/19/2024.  Presented acutely to pulmonary clinic on 04/20/2024 1 day after coming off prednisone  with acute worsening respiratory distress and desaturations at home.  No fever or chills.  Consideration for prednisone  rebound was given and patient was restarted on prednisone  in the hospital but he is continue to deteriorate and ended up in the ER where he is on 80% FiO2 on  BiPAP but compensating quite well and alert and oriented.  CT scan shows progressive bilateral lower lobe consolidation compared to earlier in January 2026 and definitely worse compared to 1 month earlier.  Wife at bedside and critical care medicine called for consultation and Jolynn Pack, ER bed 32.  He has an advanced directive and wife said he is full code.  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   reports that he quit smoking about 42 years ago. His smoking use included cigarettes. He has never been exposed to tobacco smoke. He has never used smokeless tobacco.  Past Surgical History:  Procedure Laterality Date   ABDOMINAL AORTIC ANEURYSM REPAIR     BONE MARROW TRANSPLANT     BRONCHIAL BIOPSY  04/17/2022   Procedure: BRONCHIAL BIOPSIES;  Surgeon: Gladis Leonor HERO, MD;  Location: [facility] ENDOSCOPY;  Service: Pulmonary;;   BRONCHIAL BIOPSY  06/19/2022   Procedure: BRONCHIAL BIOPSIES;  Surgeon: Gladis Leonor HERO, MD;  Location: [facility] ENDOSCOPY;  Service: Pulmonary;;   BRONCHIAL BRUSHINGS  04/17/2022   Procedure: BRONCHIAL BRUSHINGS;  Surgeon: Gladis Leonor HERO,  MD;  Location: [facility] ENDOSCOPY;  Service: Pulmonary;;   BRONCHIAL NEEDLE ASPIRATION BIOPSY  04/17/2022   Procedure: BRONCHIAL NEEDLE ASPIRATION BIOPSIES;  Surgeon: Gladis Leonor HERO, MD;  Location: [facility] ENDOSCOPY;  Service:  Pulmonary;;   BRONCHIAL NEEDLE ASPIRATION BIOPSY  06/19/2022   Procedure: BRONCHIAL NEEDLE ASPIRATION BIOPSIES;  Surgeon: Gladis Leonor HERO, MD;  Location: [facility] ENDOSCOPY;  Service: Pulmonary;;   BRONCHIAL WASHINGS  04/17/2022   Procedure: BRONCHIAL WASHINGS;  Surgeon: Gladis Leonor HERO, MD;  Location[facility] ENDOSCOPY;  Service: Pulmonary;;   BRONCHIAL WASHINGS  06/19/2022   Procedure: BRONCHIAL WASHINGS;  Surgeon: Gladis Leonor HERO, MD;  Location: [facility] ENDOSCOPY;  Service: Pulmonary;;   CARDIAC CATHETERIZATION     ENDARTERECTOMY Left 01/30/2022   Procedure: ENDARTERECTOMY CAROTID;  Surgeon: Lanis Fonda BRAVO, MD;  Location: [facility] OR;  Service: Vascular;  Laterality: Left;   EYE SURGERY     FIDUCIAL MARKER PLACEMENT  06/19/2022   Procedure: FIDUCIAL MARKER PLACEMENT;  Surgeon: Gladis Leonor HERO, MD;  Location: [facility] ENDOSCOPY;  Service: Pulmonary;;   HERNIA REPAIR     KNEE SURGERY Bilateral    Joint replacements   MOHS SURGERY  2024   x 2 R & L   PATCH ANGIOPLASTY Left 01/30/2022   Procedure: PATCH ANGIOPLASTY WITH XENOSURE BIOLOGIC PATCH [id number];  Surgeon: Lanis Fonda BRAVO, MD;  Location: [facility] OR;  Service: Vascular;  Laterality: Left;   PROSTATE BIOPSY     TONSILLECTOMY  1946   VIDEO BRONCHOSCOPY WITH RADIAL ENDOBRONCHIAL ULTRASOUND  04/17/2022   Procedure: VIDEO BRONCHOSCOPY WITH RADIAL ENDOBRONCHIAL ULTRASOUND;  Surgeon: Gladis Leonor HERO, MD;  Location: [facility] ENDOSCOPY;  Service: Pulmonary;;   VIDEO BRONCHOSCOPY WITH RADIAL ENDOBRONCHIAL ULTRASOUND  06/19/2022   Procedure: VIDEO BRONCHOSCOPY WITH RADIAL ENDOBRONCHIAL ULTRASOUND;  Surgeon: Gladis Leonor HERO, MD;  Location: [facility] ENDOSCOPY;  Service: Pulmonary;;    Allergies[1]  Immunization History  Administered Date(s) Administered   DTaP 05/06/2019, 07/01/2019, 08/26/2019   Fluad Quad(high Dose 65+) 12/02/2021   Fluad Trivalent(High Dose 65+) 12/11/2022   HIB, Unspecified 05/06/2019, 07/01/2019, 10/21/2019   Hepatitis B, ADULT 05/06/2019,  07/01/2019, 10/21/2019   INFLUENZA, HIGH DOSE SEASONAL PF 12/28/2015   IPV 05/06/2019, 07/01/2019, 10/21/2019   Influenza, Seasonal, Injecte, Preservative Fre 01/01/2012   Pneumococcal Conjugate-13 01/04/2014, 05/06/2019, 07/01/2019, 08/26/2019   Pneumococcal Polysaccharide-23 10/21/2019   Zoster Recombinant(Shingrix) 08/26/2019, 10/21/2019    History reviewed. No pertinent family history.  Current Medications[2]     Significant Hospital Events:  04/22/2024 - admit   Interim History / Subjective:   04/22/2024 - seen in [facility], ER at 32  Objective   Blood pressure (!) 161/102, pulse (!) 116, temperature (!) 97.4 F (36.3 C), temperature source Axillary, resp. rate (!) 23, height 5' 8 (1.727 m), weight 91.6 kg, SpO2 94%.    FiO2 (%):  [80 %] 80 % PEEP:  [5 cmH20-6 cmH20] 6 cmH20  No intake or output data in the 24 hours ending 04/22/24 0842 Filed Weights   04/22/24 0319  Weight: 91.6 kg    Examination: General: On BiPAP.  He is able to talk through BiPAP. HENT: BiPAP present Lungs: Bilateral crackles but no significant respiratory distress mildly tachypneic at 20 no paradoxical respiration Cardiovascular: Mildly tachycardic Abdomen: Soft nontender no organomegaly Extremities: Intact no sinus no clubbing no edema Neuro: Alert and oriented x 3.  No delirium GU: Not examined  Resolved Hospital Problem list   x  Assessment & Plan:  ASSESSMENT / PLAN:  PULMONARY  A:  Severe acute hypoxemic  respiratory failure with recurrence following admission and discharged in the past 1 month  - Worsening bilateral lower lobe consolidation  - 80% FiO2 on BiPAP on admission  - At risk for pulmonary embolism [on Relugolix ] but PE ruled out  - hemoptysis - probably eliuis + pulm inflammation (no hgb drop - do doubt DAH)  04/22/2024 -> differential diagnosis of HCAP versus diastolic dysfunction versus new respiratory viral infection versus steroid sensitive ILD/Boop [postviral].   Versus aspiration [noted to be on omega-3 and Krill oil at home] requiring 80% FiO2 and BiPAP.  Compensating well  P:   BiPAP FiO2 for pulse ox goal greater than 88% Intubate if worse [full code]  Check for respiratory viruses and bacteria Check for serology Check for ESR  Plan treat with IV steroids [initial presumption of steroid responsiveness]    NEUROLOGIC A:   Chronic pain on gabapentin  Alert and oriented at admission but at risk for delirium   04/22/24 - RN in ER reports some restlesses and feeling axnious. PAtient asking for xanax   P:   Monitor in ICU Xanax  0.25mg  x 1 Start precedex  for some calm even though he is not deliriuous (given anxiety, avoiding benzo)    VASCULAR A:   At risk for circulatory shock but BP high at admit and getting fluids . BNP high  P:  MAP goal greater than 65 Stop IVF - give lasix  in ER Just do KVO  fluids  CARDIAC STRUCTURAL A: Grade 2 diastolic dysfunctio Decemberr 7974 during admission for parainfluenza virus Hyperlipidemia on omega-3, Krill oil and Lipitor  - 04/22/2024 proBNP elevated at 1600 [higher than baseline but improved compared to previous admission]  P: Repeat echo Cycle enzymes Diurese Hold statin and fish oil/omega-3 at least for the moment  CARDIAC ELECTRICAL A: Atrial fibrillation on Eliquis  and Lopressor     P: Check EKG Telemonitoring Okay for Lopressor  Okay for Eliquis   INFECTIOUS A:   On Valacylovir at home -> indication? Parainfluenza virus 3 admission end of December 2025 Concern for HCAP versus aspiration versus steroid responsive postviral Boop  v [facility]-admission 04/22/2024   P:   Check procalcitonin Check respiratory virus panel Check urine strep/Legionella  Empiric antibiotic -for HCAP Continue valacylvoir - but need to investigate need  RENAL A:  History of prostate cancer but normal creatinine - on flomax  At risk for kidney injury this admission    P:   Monitor Continue flomax   METABOLIC A: Lactic acidosis on arrival to the ER resolved   P Monitor   ELECTROLYTES A:  At risk for electrolyte imbalance P: Monitor and replete as needed   GASTROINTESTINAL A:   Known GERD    P:   PPI home dose of N.p.o. except sips and meds at this x 24 hours  HEMATOLOGIC   - HEME A:  Chronic anemia with hemoglobin 10 g% given at admission   P:  - PRBC for hgb </= 6.9gm%    - exceptions are   -  if ACS susepcted/confirmed then transfuse for hgb </= 8.0gm%,  or    -  active bleeding with hemodynamic instability, then transfuse regardless of hemoglobin value   At at all times try to transfuse 1 unit prbc as possible with exception of active hemorrhage     ENDOCRINE A:   At risk for hypoglycemia with steroids On synthoriad at home  P:   SSI Contnue synthroids  MSK/DERM History of MDS and laryngeal cancer History of localized lung cancer with empiric SBRT  2024 History of prostate cancer on Relugolix  -not taking per wife  Plan - Monitor    Other issues  Mobility: Bedrest Code Status: Full code Family Communication: Wife and patient updated at the bedside in the ER 04/22/2024 Disposition: Go to the ICU      ATTESTATION & SIGNATURE   The patient Davius Goudeau is critically ill with multiple organ systems failure and requires high complexity decision making for assessment and support, frequent evaluation and titration of therapies, application of advanced monitoring technologies and extensive interpretation of multiple databases.   Critical Care Time devoted to patient care services described in this note is  70  Minutes. This time reflects time of care of this signee Dr Dorethia Cave. This critical care time does not reflect procedure time, or teaching time or supervisory time of PA/NP/Med student/Med Resident etc but could involve care discussion time      SIGNATURE    Dr. Dorethia Cave, M.D.,  F.C.C.P,  Pulmonary and Critical Care Medicine Staff Physician, [facility] Center Director - Interstitial Lung Disease  Program  Pulmonary Fibrosis [facility] [place], [place], [address]  NPI Number:  NPI #[id number]  Pager: [phone], If no answer  -> Check AMION or Try [phone] Telephone (clinical office): [phone] Telephone (research): [phone]  8:42 AM 04/22/2024   04/22/2024 8:42 AM    LABS    PULMONARY No results for input(s): PHART, PCO2ART, PO2ART, HCO3, TCO2, O2SAT in the last 168 hours.  Invalid input(s): PCO2, PO2  CBC Recent Labs  Lab 04/22/24 0335  HGB 10.5*  HCT 32.4*  WBC 9.2  PLT 282    COAGULATION Recent Labs  Lab 04/22/24 0335  INR 1.3*    CARDIAC  No results for input(s): TROPONINI in the last 168 hours. Recent Labs  Lab 04/22/24 0335  PROBNP 1,673.0*    CHEMISTRY Recent Labs  Lab 04/22/24 0335  NA 135  K 4.2  CL 100  CO2 21*  GLUCOSE 139*  BUN 25*  CREATININE 0.98  CALCIUM  8.8*   Estimated Creatinine Clearance: 65 mL/min (by C-G formula based on SCr of 0.98 mg/dL).   LIVER Recent Labs  Lab 04/22/24 0335  AST 34  ALT 36  ALKPHOS 85  BILITOT 0.4  PROT 7.4  ALBUMIN 4.1  INR 1.3*     INFECTIOUS Recent Labs  Lab 04/22/24 0345 04/22/24 0510  LATICACIDVEN 3.2* 1.8     ENDOCRINE CBG (last 3)  No results for input(s): GLUCAP in the last 72 hours.       IMAGING x48h  - image(s) personally visualized  -   highlighted in bold CT Angio Chest PE W and/or Wo Contrast Result Date: 04/22/2024 EXAM: CTA of the Chest with contrast for PE 04/22/2024 06:13:48 AM TECHNIQUE: CTA of the chest was performed after the administration of intravenous contrast. Multiplanar reformatted images are provided for review. MIP images are provided for review. Automated exposure control, iterative reconstruction, and/or weight based adjustment of the  mA/kV was utilized to reduce the radiation dose to as low as reasonably achievable. COMPARISON: CTA chest 04/02/2024. Restaging CT in August last year. CLINICAL HISTORY: 84 year old male with hemoptysis, acute shortness of breath, atrial fibrillation on blood thinner, recent pneumonia, and treated small cell lung cancer. FINDINGS: PULMONARY ARTERIES: Pulmonary arteries are adequately opacified for evaluation. No pulmonary embolism. Main pulmonary artery is normal in caliber. MEDIASTINUM: Little to no contrast in the  aorta. Stable aortic atherosclerosis and tortuosity. Calcified coronary artery atherosclerosis. Heart size remains normal. The pericardium demonstrates no acute abnormality. LYMPH NODES: No mediastinal, hilar or axillary lymphadenopathy. LUNGS AND PLEURA: Elevation of the right hemidiaphragm but lower lung volumes compared to baseline. Likely abnormal right hilum and upper lobe suggesting previously biopsied and radiated lung lesion, stable appearance including regional bronchiectasis and radiation pneumonitis changes compared to restaging CT in August last year. Since that time, extensive bilateral combined subsolid and early consolidative lung opacity in all lobes. Bilateral lower lobes opacification has progressed from the CTA earlier this month. Confluent left upper lobe opacities not significantly changed. Small layering right pleural effusion stable from last month. No pneumothorax. Differential considerations include aggressive bilateral pneumonia, drug reaction or toxicity, and unlikely to be asymmetric edema. UPPER ABDOMEN: Mildly nodular liver contour in the upper abdomen. Otherwise stable and negative visible non-contrast upper abdominal viscera. SOFT TISSUES AND BONES: No acute bone or soft tissue abnormality. IMPRESSION: 1. No pulmonary embolism. 2. Extensive bilateral lung opacities, subsolid and early consolidative, and progressed in both lower lobes since 04/02/2024. Top differential  considerations include progression bilateral pneumonia, drug reaction or toxicity, other non-infectious inflammation. Stable small layering right pleural effusion, lung changes felt unlikely to be pulmonary edema. 3. Superimposed Chronic post-treatment changes of right hilar lung cancer. 4. Aortic Atherosclerosis (ICD10-I70.0). 5. Questionable visible liver contour nodularity, cirrhosis. Electronically signed by: Helayne Hurst MD 04/22/2024 06:57 AM EST RP Workstation: [id number]   DG Chest 2 View Result Date: 04/22/2024 EXAM: 2 VIEW(S) XRAY OF THE CHEST 04/20/2024 12:09:08 PM COMPARISON: CT chest dated 04/02/2024. CLINICAL HISTORY: Acute respiratory failure. FINDINGS: LINES, TUBES AND DEVICES: Partially visualized aortic stent. LUNGS AND PLEURA: Redemonstrated right perihilar posttreatment changes. Multifocal patchy opacities, suspicious for pneumonia when correlating with prior CT. Trace right pleural effusion. No pneumothorax. HEART AND MEDIASTINUM: No acute abnormality of the cardiac and mediastinal silhouettes. BONES AND SOFT TISSUES: No acute osseous abnormality. IMPRESSION: 1. Multifocal patchy opacities, suspicious for pneumonia. 2. Trace right pleural effusion. Electronically signed by: Pinkie Pebbles MD 04/22/2024 03:56 AM EST RP Workstation: [id number]   DG Chest Port 1 View Result Date: 04/22/2024 EXAM: 1 VIEW XRAY OF THE CHEST 04/22/2024 03:46:02 AM COMPARISON: CT chest dated 04/02/2024. CLINICAL HISTORY: Questionable sepsis. Evaluate for abnormality. FINDINGS: LUNGS AND PLEURA: Low lung volumes. Progressive diffuse bilateral airspace opacities, left greater than right, suspicious for pneumonia. Small bilateral pleural effusions. Platelike radiation changes in right upper lobe with fiducial marker, residual tumor not excluded. No pneumothorax. HEART AND MEDIASTINUM: No acute abnormality of the cardiac and mediastinal silhouettes. BONES AND SOFT TISSUES: No acute osseous abnormality. IMPRESSION: 1.  Progressive multifocal pneumonia. Small bilateral pleural effusions. 2. Platelike right upper lobe radiation changes. Residual tumor is not excluded. Electronically signed by: Pinkie Pebbles MD 04/22/2024 03:54 AM EST RP Workstation: [id number]        [1] No Known Allergies [2]  Current Facility-Administered Medications:    azithromycin  (ZITHROMAX ) 500 mg in sodium chloride  0.9 % 250 mL IVPB, 500 mg, Intravenous, Once, Raford Lenis, MD, Last Rate: 250 mL/hr at 04/22/24 0833, 500 mg at 04/22/24 9166   lactated ringers  bolus 1,000 mL, 1,000 mL, Intravenous, Once, Raford Lenis, MD  Current Outpatient Medications:    albuterol  (VENTOLIN  HFA) 108 (90 Base) MCG/ACT inhaler, Inhale 2 puffs into the lungs every 6 (six) hours as needed for wheezing or shortness of breath., Disp: 6.7 g, Rfl: 2   aspirin  EC 81 MG tablet, Take 81 mg by mouth  in the morning. Swallow whole., Disp: , Rfl:    atorvastatin  (LIPITOR) 40 MG tablet, Take 40 mg by mouth in the morning., Disp: , Rfl:    ELIQUIS  5 MG TABS tablet, Take 5 mg by mouth 2 (two) times daily., Disp: , Rfl:    fluticasone  (FLONASE ) 50 MCG/ACT nasal spray, Place 1 spray into both nostrils daily., Disp: , Rfl:    gabapentin  (NEURONTIN ) 100 MG capsule, Take 300 mg by mouth as needed (Nerve pain)., Disp: , Rfl:    guaiFENesin  (MUCINEX ) 600 MG 12 hr tablet, Take 1 tablet (600 mg total) by mouth 2 (two) times daily., Disp: 15 tablet, Rfl: 0   HYDROcodone  bit-homatropine (HYCODAN) 5-1.5 MG/5ML syrup, Take 5 mLs by mouth every 6 (six) hours. (Patient taking differently: Take 5 mLs by mouth every 6 (six) hours. prn), Disp: , Rfl:    Krill Oil 350 MG CAPS, Take 350 mg by mouth in the morning and at bedtime., Disp: , Rfl:    levothyroxine  (SYNTHROID ) 50 MCG tablet, Take 50 mcg by mouth daily., Disp: , Rfl:    Melatonin 10 MG CAPS, Take 10 mg by mouth as needed (Sleep)., Disp: , Rfl:    metoprolol  tartrate (LOPRESSOR ) 25 MG tablet, Take 25 mg by mouth 2 (two) times  daily., Disp: , Rfl:    Multiple Vitamin (MULTIVITAMIN WITH MINERALS) TABS tablet, Take 1 tablet by mouth in the morning. Centrum, Disp: , Rfl:    Omega-3 Fatty Acids (OMEGA 3 PO), Take 1 tablet by mouth in the morning and at bedtime., Disp: , Rfl:    pantoprazole  (PROTONIX ) 20 MG tablet, Take 20 mg by mouth every evening., Disp: , Rfl:    predniSONE  (DELTASONE ) 10 MG tablet, Take 4 tablets (40 mg total) by mouth daily with breakfast. Start taper at 40 mg/day for a week, then 30 mg/day for a week, 20 mg/day for a week, 10 mg for a day for a week.  Then changed to 5 mg/day for a week and then stop, Disp: 180 tablet, Rfl: 0   tamsulosin  (FLOMAX ) 0.4 MG CAPS capsule, Take 0.4 mg by mouth in the morning., Disp: , Rfl:    valACYclovir  (VALTREX ) 500 MG tablet, Take 500 mg by mouth in the morning., Disp: , Rfl:    levofloxacin (LEVAQUIN) 500 MG tablet, Take 500 mg by mouth daily. (Patient not taking: Reported on 04/22/2024), Disp: , Rfl:    relugolix  (ORGOVYX ) 120 MG tablet, Take 120 mg by mouth daily. (Patient not taking: Reported on 04/22/2024), Disp: , Rfl:   "

## 2024-04-22 NOTE — Care Management (Signed)
 Transition of Care [facility]) - Inpatient Brief Assessment   Patient Details  Name: Peter Greene MRN: [id number] Date of Birth: 25-Jun-1940  Transition of [facility]) CM/SW Contact:    Peter JAYSON Canary, RN Phone Number: 04/22/2024, 5:03 PM   Clinical Narrative:  84 year old discharged 3 weeks ago with fibrosis of lung. Previous lung cancer, prostate cancer, laryngeal  bone marrow transplant at [facility] in 2020. Was not on oxygen at home followed by Pulmonology Dr Forrestine.  Presents with [facility] after prednisone , On BiPap 80%    IPCM will follow for needs  Transition of Care Asessment: Insurance and Status: Insurance coverage has been reviewed Patient has primary care physician: Yes Home environment has been reviewed: [facility] with spouse Prior level of function:: Independent Prior/Current Home Services: No current home services Social Drivers of Health Review: SDOH reviewed no interventions necessary Readmission risk has been reviewed: Yes Transition of care needs: transition of care needs identified, TOC will continue to follow

## 2024-04-22 NOTE — ED Provider Notes (Signed)
 " [place] [facility] AT [facility] Provider Note   CSN: [id number] Arrival date & time: 04/22/24  9693     Patient presents with: Shortness of Breath   Peter Greene is a 84 y.o. male.   The history is provided by the patient, the spouse and the EMS personnel.  Shortness of Breath  He has history of interstitial lung disease, laryngeal cancer, bone marrow transplant, carotid artery disease, abdominal aortic aneurysm, atrial fibrillation anticoagulated on apixaban , and comes in because of shortness of breath.  He had been admitted to the hospital with pneumonia and had been discharged and was doing well and was off of oxygen.  He started to get more short of breath yesterday.  He saw his pulmonologist who ordered a chest x-ray and put him back on prednisone  40 mg daily.  Shortness of breath is gotten progressively worse.  Tonight, he coughed up blood on 5 separate occasions.  Blood was mixed with sputum.  He denies fever, chills, sweats.  He denies any chest pain.  EMS noted initial oxygen saturation of 63% improving into the 80s with supplemental oxygen.    Prior to Admission medications  Medication Sig Start Date End Date Taking? Authorizing Provider  albuterol  (VENTOLIN  HFA) 108 (90 Base) MCG/ACT inhaler Inhale 2 puffs into the lungs every 6 (six) hours as needed for wheezing or shortness of breath. 04/05/24   Singh, Prashant K, MD  aspirin  EC 81 MG tablet Take 81 mg by mouth in the morning. Swallow whole.    [provider]  atorvastatin  (LIPITOR) 40 MG tablet Take 40 mg by mouth in the morning. 06/28/21   [provider]  ELIQUIS  5 MG TABS tablet Take 5 mg by mouth 2 (two) times daily. 05/28/21   [provider]  fluticasone  (FLONASE ) 50 MCG/ACT nasal spray Place 1 spray into both nostrils daily. 02/29/24   [provider]  gabapentin  (NEURONTIN ) 100 MG capsule Take 100 mg by mouth as needed. 02/17/22   [provider]   guaiFENesin  (MUCINEX ) 600 MG 12 hr tablet Take 1 tablet (600 mg total) by mouth 2 (two) times daily. 04/05/24   Singh, Prashant K, MD  HYDROcodone  bit-homatropine (HYCODAN) 5-1.5 MG/5ML syrup Take 5 mLs by mouth every 6 (six) hours. Patient taking differently: Take 5 mLs by mouth every 6 (six) hours. prn 10/30/23   [provider]  ipratropium (ATROVENT) 0.03 % nasal spray Place 2 sprays into both nostrils daily. 03/21/22   [provider]  Anselm Oil 350 MG CAPS Take 350 mg by mouth in the morning and at bedtime.    [provider]  levothyroxine  (SYNTHROID ) 50 MCG tablet Take 50 mcg by mouth daily. 01/27/24   [provider]  Melatonin 10 MG CAPS Take 10 mg by mouth as needed.    [provider]  metoprolol  tartrate (LOPRESSOR ) 25 MG tablet Take 25 mg by mouth 2 (two) times daily. 07/14/21   [provider]  Multiple Vitamin (MULTIVITAMIN WITH MINERALS) TABS tablet Take 1 tablet by mouth in the morning. Centrum    [provider]  Omega-3 Fatty Acids (OMEGA 3 PO) Take 1 tablet by mouth in the morning and at bedtime.    [provider]  pantoprazole  (PROTONIX ) 20 MG tablet Take 20 mg by mouth every evening. 06/22/21   [provider]  predniSONE  (DELTASONE ) 10 MG tablet Take 4 tablets (40 mg total) by mouth daily with breakfast. Start taper at 40 mg/day  for a week, then 30 mg/day for a week, 20 mg/day for a week, 10 mg for a day for a week.  Then changed to 5 mg/day for a week and then stop 04/20/24 06/04/24  Mannam, Praveen, MD  relugolix  (ORGOVYX ) 120 MG tablet Take 120 mg by mouth daily.    [provider]  sildenafil (REVATIO) 20 MG tablet SMARTSIG:3-5 Tablet(s) By Mouth PRN Patient not taking: Reported on 04/20/2024 01/08/24   [provider]  tamsulosin  (FLOMAX ) 0.4 MG CAPS capsule Take 0.4 mg by mouth in the morning. 04/13/20   [provider]  valACYclovir  (VALTREX ) 500 MG tablet Take 500 mg by  mouth in the morning. 09/09/20   [provider]    Allergies: Patient has no known allergies.    Review of Systems  Respiratory:  Positive for shortness of breath.   All other systems reviewed and are negative.   Updated Vital Signs BP (!) 171/89 (BP Location: Left Arm)   Pulse (!) 115   Temp 99 F (37.2 C)   Resp (!) 22   Ht 5' 8 (1.727 m)   Wt 91.6 kg   SpO2 (!) 63%   BMI 30.71 kg/m   Physical Exam Vitals and nursing note reviewed.   84 year old male, in mild respiratory distress. Vital signs are significant for elevated heart rate, respiratory rate, blood pressure. Oxygen saturation is 63%, which is hypoxic, improved to 85% on a nonrebreather mask. Head is normocephalic and atraumatic. PERRLA, EOMI.  Back is nontender. Lungs have fine rales rather diffusely with diffuse egophony. Chest is nontender. Heart has regular rate and rhythm without murmur. Abdomen is soft, flat, nontender. Extremities have no cyanosis or edema. Skin is warm and dry without rash. Neurologic: Awake and alert and oriented, moves all extremities equally.  (all labs ordered are listed, but only abnormal results are displayed) Labs Reviewed  CULTURE, BLOOD (ROUTINE X 2)  CULTURE, BLOOD (ROUTINE X 2)  COMPREHENSIVE METABOLIC PANEL WITH GFR  CBC WITH DIFFERENTIAL/PLATELET  PROTIME-INR  PRO BRAIN NATRIURETIC PEPTIDE  URINALYSIS, W/ REFLEX TO CULTURE (INFECTION SUSPECTED)  I-STAT CG4 LACTIC ACID, ED  I-STAT CHEM 8, ED  TROPONIN T, HIGH SENSITIVITY    EKG: None  Radiology: No results found.  Cardiac monitor shows atrial fibrillation with controlled ventricular response, per my interpretation.  Procedures   Medications Ordered in the ED - No data to display                                  Medical Decision Making Amount and/or Complexity of Data Reviewed Radiology: ordered.  Risk Prescription drug management. Decision regarding hospitalization.   Acute hypoxic  respiratory failure with hemoptysis in the setting of interstitial lung disease and recent pneumonia.  This is a presentation which has a wide range of treatment options and carries with it a high risk of morbidity and complications.  Differential diagnosis includes, but is not limited to, necrotizing pneumonia, pulmonary embolism, acute bronchitis, pneumothorax.  Because of persistent hypoxia, I have ordered him to be placed on BiPAP.  I reviewed his past records and do note hospitalization 03/28/2024-04/05/2024 for pneumonia, office visit on 1/21 with chest x-ray ordered and restarting prednisone .  Chest x-ray does not have a reading, but to me it appears that there is an increased in the right upper lobe infiltrate.  I have ordered repeat chest x-ray here and I am also sending  him for a CT angiogram to rule out pulmonary embolism.  On BiPAP, he is resting comfortably and oxygen saturations are adequate.  Chest x-ray shows progressive multifocal pneumonia.  I have independently viewed the image, and agree with radiologist's interpretation.  I reviewed his laboratory tests, and my interpretation is elevated random glucose level, elevated BNP which is less than it had been on 1/3, mildly elevated troponin which is rising but this is felt to represent demand ischemia as opposed to ACS, elevated initial lactic acid level.  I have ordered early goal-directed fluids, and lactic acid level come down to normal.  Anemia is noted which is progressive (drop in hemoglobin of 0.9 g compared with 1/6).  With elevated lactic acid level and x-ray findings of pneumonia, I have ordered antibiotics for community-acquired pneumonia.  CT angiogram shows no pulmonary embolism, extensive bilateral lung opacities which have progressed which may represent bilateral pneumonia or drug reaction or noninfectious inflammation.  I have independently viewed the images, and agree with the radiologist's interpretation.  I have discussed case with  Dr. Georgina of [facility], who agrees to admit the patient.  I have also discussed the case with pulmonary critical care who agrees to see the patient in consultation.  CRITICAL CARE Performed by: Alm Lias Total critical care time: 155 minutes Critical care time was exclusive of separately billable procedures and treating other patients. Critical care was necessary to treat or prevent imminent or life-threatening deterioration. Critical care was time spent personally by me on the following activities: development of treatment plan with patient and/or surrogate as well as nursing, discussions with consultants, evaluation of patient's response to treatment, examination of patient, obtaining history from patient or surrogate, ordering and performing treatments and interventions, ordering and review of laboratory studies, ordering and review of radiographic studies, pulse oximetry and re-evaluation of patient's condition.      Final diagnoses:  Acute hypoxemic respiratory failure (HCC)  Community acquired pneumonia, unspecified laterality  Elevated lactic acid level  Normochromic normocytic anemia  Elevated troponin  Anticoagulated on apixaban     ED Discharge Orders     None          Lias Alm, MD 04/22/24 [phone]  "

## 2024-04-22 NOTE — Plan of Care (Signed)
" °  Problem: Tissue Perfusion: Goal: Adequacy of tissue perfusion will improve Outcome: Progressing   Problem: Clinical Measurements: Goal: Respiratory complications will improve Outcome: Progressing   Problem: Clinical Measurements: Goal: Cardiovascular complication will be avoided Outcome: Progressing   Problem: Activity: Goal: Risk for activity intolerance will decrease Outcome: Progressing   "

## 2024-04-22 NOTE — ED Triage Notes (Signed)
 C/o sob onset 1 hour ago denies pain  hx. Afib. States he is on eliquis  recent pneumonia , has been off o2 x 1 week, states yest am increased sob and was seen by his MD yest and started back on Prednisone  yest has had 2 doses.

## 2024-04-22 NOTE — Progress Notes (Signed)
 Pharmacy Antibiotic Note  Peter Greene is a 84 y.o. male admitted on 04/22/2024 with pneumonia.  Pharmacy has been consulted for zosyn  and vancomycin  dosing.  Plan: Zosyn  3.375g q8h  Vancomycin  2000mg  x 1 load followed by vancomycin  1750mg  q24h (eAUC 511, Scr 0.98)  F/u renal function, infectious work up and narrow as able Vancomycin  levels as needed  Height: 5' 8 (172.7 cm) Weight: 91.6 kg (201 lb 15.1 oz) IBW/kg (Calculated) : 68.4  Temp (24hrs), Avg:98.2 F (36.8 C), Min:97.4 F (36.3 C), Max:99 F (37.2 C)  Recent Labs  Lab 04/22/24 0335 04/22/24 0345 04/22/24 0510  WBC 9.2  --   --   CREATININE 0.98  --   --   LATICACIDVEN  --  3.2* 1.8    Estimated Creatinine Clearance: 65 mL/min (by C-G formula based on SCr of 0.98 mg/dL).    Allergies[1]  Antimicrobials this admission: Vancomycin  1/23 > Zosyn  1/23 >  Dose adjustments this admission:  Microbiology results: 1/23 bcx: 1/23 resp panel: 1/23 MRSA pcr:   Thank you for allowing pharmacy to be a part of this patients care.  Leonor GORMAN Bash 04/22/2024 9:49 AM     [1] No Known Allergies

## 2024-04-22 NOTE — ED Notes (Signed)
 ED TO INPATIENT HANDOFF REPORT  ED Nurse Name and Phone #:  Fonda Ivery RAMAN Name/Age/Gender Peter Greene 84 y.o. male Room/Bed: 032C/032C  Code Status   Code Status: Full Code  Home/SNF/Other Home Patient oriented to: self, place, time, and situation Is this baseline? 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 Afib. States he is on eliquis  recent pneumonia , has been off o2 x 1 week, states yest am increased sob and was seen by his MD yest and started back on Prednisone  yest has had 2 doses.     Allergies Allergies[1]  Level of Care/Admitting Diagnosis ED Disposition     ED Disposition  Admit   Condition  --   Comment  Hospital Area: [facility] [[id number]]  Level of Care: ICU [6]  Special needs or ICU bed type request: Medical ICU [place]  Diagnosis: Acute hypoxemic respiratory failure [facility]) [[id number]]  Admitting Physician: GERONIMO AMEL [phone]  Attending Physician: GERONIMO AMEL [phone]  Certification:: I certify this patient will need inpatient services for at least 2 midnights  Expected Medical Readiness: 04/29/2024          B Medical/Surgery History 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   Hyperlipidemia    atorvastatin    Hypothyroidism    Interstitial lung disease (HCC)    Laryngeal cancer (HCC) 2004   Neuromuscular disorder (HCC)    bilateral feet neuropathy   Pneumonia 07/2021   x 2   Primary cancer of bone marrow (HCC)    Prostate cancer (HCC)    Skin cancer    Head   Thyroid disease     Thyroid nodule 2024   Right lower lobe   Past Surgical History:  Procedure Laterality Date   ABDOMINAL AORTIC ANEURYSM REPAIR     BONE MARROW TRANSPLANT     BRONCHIAL BIOPSY  04/17/2022   Procedure: BRONCHIAL BIOPSIES;  Surgeon: Gladis Leonor HERO, MD;  Location: [facility] ENDOSCOPY;  Service: Pulmonary;;   BRONCHIAL BIOPSY  06/19/2022   Procedure: BRONCHIAL BIOPSIES;  Surgeon: Gladis Leonor HERO, MD;  Location: [facility] ENDOSCOPY;  Service: Pulmonary;;   BRONCHIAL BRUSHINGS  04/17/2022   Procedure: BRONCHIAL BRUSHINGS;  Surgeon: Gladis Leonor HERO, MD;  Location: [facility] ENDOSCOPY;  Service: Pulmonary;;   BRONCHIAL NEEDLE ASPIRATION BIOPSY  04/17/2022   Procedure: BRONCHIAL NEEDLE ASPIRATION BIOPSIES;  Surgeon: Gladis Leonor HERO, MD;  Location: [facility] ENDOSCOPY;  Service: Pulmonary;;   BRONCHIAL NEEDLE ASPIRATION BIOPSY  06/19/2022   Procedure: BRONCHIAL NEEDLE ASPIRATION BIOPSIES;  Surgeon: Gladis Leonor HERO, MD;  Location: [facility] ENDOSCOPY;  Service: Pulmonary;;   BRONCHIAL WASHINGS  04/17/2022   Procedure: BRONCHIAL WASHINGS;  Surgeon: Gladis Leonor HERO, MD;  Location: [facility] ENDOSCOPY;  Service: Pulmonary;;   BRONCHIAL WASHINGS  06/19/2022   Procedure: BRONCHIAL WASHINGS;  Surgeon: Gladis Leonor HERO, MD;  Location: [facility] ENDOSCOPY;  Service: Pulmonary;;   CARDIAC CATHETERIZATION     ENDARTERECTOMY Left 01/30/2022   Procedure: ENDARTERECTOMY CAROTID;  Surgeon: Lanis Fonda BRAVO, MD;  Location: [facility] OR;  Service: Vascular;  Laterality: Left;   EYE SURGERY     FIDUCIAL MARKER PLACEMENT  06/19/2022   Procedure: FIDUCIAL MARKER PLACEMENT;  Surgeon: Gladis Leonor HERO, MD;  Location: [facility] ENDOSCOPY;  Service: Pulmonary;;   HERNIA REPAIR     KNEE SURGERY Bilateral    Joint replacements   MOHS SURGERY  2024   x 2 R & L   PATCH ANGIOPLASTY Left 01/30/2022   Procedure: PATCH ANGIOPLASTY WITH XENOSURE BIOLOGIC PATCH [id number];  Surgeon: Lanis Fonda BRAVO, MD;  Location: [facility] OR;  Service: Vascular;  Laterality: Left;    PROSTATE BIOPSY     TONSILLECTOMY  1946   VIDEO BRONCHOSCOPY WITH RADIAL ENDOBRONCHIAL ULTRASOUND  04/17/2022   Procedure: VIDEO BRONCHOSCOPY WITH RADIAL ENDOBRONCHIAL ULTRASOUND;  Surgeon: Gladis Leonor HERO, MD;  Location: [facility] ENDOSCOPY;  Service: Pulmonary;;   VIDEO BRONCHOSCOPY WITH RADIAL ENDOBRONCHIAL ULTRASOUND  06/19/2022   Procedure: VIDEO BRONCHOSCOPY WITH RADIAL ENDOBRONCHIAL ULTRASOUND;  Surgeon: Gladis Leonor HERO, MD;  Location: [facility] ENDOSCOPY;  Service: Pulmonary;;     A IV Location/Drains/Wounds Patient Lines/Drains/Airways Status     Active Line/Drains/Airways     Name Placement date Placement time Site Days   Peripheral IV 04/22/24 20 G Right Antecubital 04/22/24  0338  Antecubital  less than 1            Intake/Output Last 24 hours  Intake/Output Summary (Last 24 hours) at 04/22/2024 1153 Last data filed at 04/22/2024 0918 Gross per 24 hour  Intake 500 ml  Output --  Net 500 ml    Labs/Imaging Results for orders placed or performed during the hospital encounter of 04/22/24 (from the past 48 hours)  Comprehensive metabolic panel     Status: Abnormal   Collection Time: 04/22/24  3:35 AM  Result Value Ref Range   Sodium 135 135 - 145 mmol/L   Potassium 4.2 3.5 - 5.1 mmol/L   Chloride 100 98 - 111 mmol/L   CO2 21 (L) 22 - 32 mmol/L   Glucose, Bld 139 (H) 70 - 99 mg/dL    Comment: Glucose reference range applies only to samples taken after fasting for at least 8 hours.   BUN 25 (H) 8 - 23 mg/dL   Creatinine, Ser 9.01 0.61 - 1.24 mg/dL   Calcium  8.8 (L) 8.9 - 10.3 mg/dL   Total Protein 7.4 6.5 - 8.1 g/dL   Albumin 4.1 3.5 - 5.0 g/dL   AST 34 15 - 41 U/L   ALT 36 0 - 44 U/L   Alkaline Phosphatase 85 38 - 126 U/L   Total Bilirubin 0.4 0.0 - 1.2 mg/dL   GFR, Estimated >39 >39 mL/min    Comment: (NOTE) Calculated using the CKD-EPI Creatinine Equation (2021)    Anion gap 14 5 - 15    Comment: Performed at [facility], [address]  [place], [place] [address]  CBC with Differential     Status: Abnormal   Collection Time: 04/22/24  3:35 AM  Result Value Ref Range   WBC 9.2 4.0 - 10.5 K/uL   RBC 3.47 (L) 4.22 - 5.81 MIL/uL   Hemoglobin 10.5 (L) 13.0 - 17.0 g/dL   HCT 67.5 (L) 60.9 - 47.9 %   MCV 93.4 80.0 - 100.0 fL   MCH 30.3 26.0 - 34.0 pg   MCHC 32.4 30.0 - 36.0 g/dL   RDW 82.4 (H) 88.4 - 84.4 %   Platelets 282 150 - 400 K/uL   nRBC 0.4 (H) 0.0 - 0.2 %  Neutrophils Relative % 66 %   Neutro Abs 6.1 1.7 - 7.7 K/uL   Lymphocytes Relative 25 %   Lymphs Abs 2.3 0.7 - 4.0 K/uL   Monocytes Relative 8 %   Monocytes Absolute 0.7 0.1 - 1.0 K/uL   Eosinophils Relative 1 %   Eosinophils Absolute 0.1 0.0 - 0.5 K/uL   Basophils Relative 0 %   Basophils Absolute 0.0 0.0 - 0.1 K/uL   Immature Granulocytes 0 %   Abs Immature Granulocytes 0.04 0.00 - 0.07 K/uL    Comment: Performed at [facility], [address] [place], [place] [address]  Protime-INR     Status: Abnormal   Collection Time: 04/22/24  3:35 AM  Result Value Ref Range   Prothrombin Time 17.0 (H) 11.4 - 15.2 seconds   INR 1.3 (H) 0.8 - 1.2    Comment: (NOTE) INR goal varies based on device and disease states. Performed at [facility], [address], [place], [place] [address]   Pro Brain natriuretic peptide     Status: Abnormal   Collection Time: 04/22/24  3:35 AM  Result Value Ref Range   Pro Brain Natriuretic Peptide 1,673.0 (H) <300.0 pg/mL    Comment: (NOTE) Age Group        Cut-Points    Interpretation  < 50 years     450 pg/mL       NT-proBNP > 450 pg/mL indicates                                ADHF is likely              50 to 75 years  900 pg/mL      NT-proBNP > 900 pg/mL indicates          ADHF is likely  > 75 years      1800 pg/mL     NT-proBNP > 1800 pg/mL indicates          ADHF is likely                           All ages    Results between       Indeterminate. Further clinical             300 and the cut-   information  is needed to determine            point for age group   if ADHF is present.                                                             Elecsys proBNP II/ Elecsys proBNP II STAT           Cut-Point                       Interpretation  300 pg/mL                    NT-proBNP <300pg/mL indicates                             ADHF is not  likely  Performed at [facility], [address], [place], [place] [address]   Troponin T, High Sensitivity     Status: Abnormal   Collection Time: 04/22/24  3:35 AM  Result Value Ref Range   Troponin T High Sensitivity 32 (H) 0 - 19 ng/L    Comment: (NOTE) Biotin concentrations > 1000 ng/mL falsely decrease TnT results.  Serial cardiac troponin measurements are suggested.  Refer to the Links section for chest pain algorithms and additional  guidance. Performed at [facility], [address], [place], [place] [address]   I-Stat Lactic Acid, ED     Status: Abnormal   Collection Time: 04/22/24  3:45 AM  Result Value Ref Range   Lactic Acid, Venous 3.2 (HH) 0.5 - 1.9 mmol/L   Comment NOTIFIED PHYSICIAN   Blood Culture (routine x 2)     Status: None (Preliminary result)   Collection Time: 04/22/24  4:31 AM   Specimen: BLOOD LEFT ARM  Result Value Ref Range   Specimen Description BLOOD LEFT ARM    Special Requests      BOTTLES DRAWN AEROBIC ONLY Blood Culture results may not be optimal due to an inadequate volume of blood received in culture bottles   Culture      NO GROWTH < 12 HOURS Performed at [facility], [address] [place], [place] [address]    Report Status PENDING   Blood Culture (routine x 2)     Status: None (Preliminary result)   Collection Time: 04/22/24  5:08 AM   Specimen: BLOOD  Result Value Ref Range   Specimen Description BLOOD BLOOD LEFT ARM    Special Requests      BOTTLES DRAWN AEROBIC AND ANAEROBIC Blood Culture adequate volume   Culture      NO GROWTH < 12 HOURS Performed at [facility], [address] [place], [place] [address]    Report Status PENDING   Troponin T, High Sensitivity     Status: Abnormal   Collection Time: 04/22/24  5:08 AM  Result Value Ref Range   Troponin T High Sensitivity 46 (H) 0 - 19 ng/L    Comment: (NOTE) Biotin concentrations > 1000 ng/mL falsely decrease TnT results.  Serial cardiac troponin measurements are suggested.  Refer to the Links section for chest pain algorithms and additional  guidance. Performed at [facility], [address] [place], [place] [address]   I-Stat Lactic Acid, ED     Status: None   Collection Time: 04/22/24  5:10 AM  Result Value Ref Range   Lactic Acid, Venous 1.8 0.5 - 1.9 mmol/L  Urinalysis, w/ Reflex to Culture (Infection Suspected) -Urine, Clean Catch     Status: Abnormal   Collection Time: 04/22/24  5:35 AM  Result Value Ref Range   Specimen Source URINE, CLEAN CATCH    Color, Urine YELLOW YELLOW   APPearance CLEAR CLEAR   Specific Gravity, Urine 1.021 1.005 - 1.030   pH 5.0 5.0 - 8.0   Glucose, UA NEGATIVE NEGATIVE mg/dL   Hgb urine dipstick NEGATIVE NEGATIVE   Bilirubin Urine NEGATIVE NEGATIVE   Ketones, ur NEGATIVE NEGATIVE mg/dL   Protein, ur 899 (A) NEGATIVE mg/dL   Nitrite NEGATIVE NEGATIVE   Leukocytes,Ua NEGATIVE NEGATIVE   RBC / HPF 0-5 0 - 5 RBC/hpf   WBC, UA 0-5 0 - 5 WBC/hpf    Comment:        Reflex urine  culture not performed if WBC <=10, OR if Squamous epithelial cells >5. If Squamous epithelial cells >5 suggest recollection.    Bacteria, UA NONE SEEN NONE SEEN   Squamous Epithelial / HPF 0-5 0 - 5 /HPF   Mucus PRESENT     Comment: Performed at [facility], [address], [place], [place] [address]   CT Angio Chest PE W and/or Wo Contrast Result Date: 04/22/2024 EXAM: CTA of the Chest with contrast for PE 04/22/2024 06:13:48 AM TECHNIQUE: CTA of the chest was performed after the administration of intravenous contrast. Multiplanar reformatted images are  provided for review. MIP images are provided for review. Automated exposure control, iterative reconstruction, and/or weight based adjustment of the mA/kV was utilized to reduce the radiation dose to as low as reasonably achievable. COMPARISON: CTA chest 04/02/2024. Restaging CT in August last year. CLINICAL HISTORY: 84 year old male with hemoptysis, acute shortness of breath, atrial fibrillation on blood thinner, recent pneumonia, and treated small cell lung cancer. FINDINGS: PULMONARY ARTERIES: Pulmonary arteries are adequately opacified for evaluation. No pulmonary embolism. Main pulmonary artery is normal in caliber. MEDIASTINUM: Little to no contrast in the aorta. Stable aortic atherosclerosis and tortuosity. Calcified coronary artery atherosclerosis. Heart size remains normal. The pericardium demonstrates no acute abnormality. LYMPH NODES: No mediastinal, hilar or axillary lymphadenopathy. LUNGS AND PLEURA: Elevation of the right hemidiaphragm but lower lung volumes compared to baseline. Likely abnormal right hilum and upper lobe suggesting previously biopsied and radiated lung lesion, stable appearance including regional bronchiectasis and radiation pneumonitis changes compared to restaging CT in August last year. Since that time, extensive bilateral combined subsolid and early consolidative lung opacity in all lobes. Bilateral lower lobes opacification has progressed from the CTA earlier this month. Confluent left upper lobe opacities not significantly changed. Small layering right pleural effusion stable from last month. No pneumothorax. Differential considerations include aggressive bilateral pneumonia, drug reaction or toxicity, and unlikely to be asymmetric edema. UPPER ABDOMEN: Mildly nodular liver contour in the upper abdomen. Otherwise stable and negative visible non-contrast upper abdominal viscera. SOFT TISSUES AND BONES: No acute bone or soft tissue abnormality. IMPRESSION: 1. No pulmonary  embolism. 2. Extensive bilateral lung opacities, subsolid and early consolidative, and progressed in both lower lobes since 04/02/2024. Top differential considerations include progression bilateral pneumonia, drug reaction or toxicity, other non-infectious inflammation. Stable small layering right pleural effusion, lung changes felt unlikely to be pulmonary edema. 3. Superimposed Chronic post-treatment changes of right hilar lung cancer. 4. Aortic Atherosclerosis (ICD10-I70.0). 5. Questionable visible liver contour nodularity, cirrhosis. Electronically signed by: Helayne Hurst MD 04/22/2024 06:57 AM EST RP Workstation: [id number]   DG Chest 2 View Result Date: 04/22/2024 EXAM: 2 VIEW(S) XRAY OF THE CHEST 04/20/2024 12:09:08 PM COMPARISON: CT chest dated 04/02/2024. CLINICAL HISTORY: Acute respiratory failure. FINDINGS: LINES, TUBES AND DEVICES: Partially visualized aortic stent. LUNGS AND PLEURA: Redemonstrated right perihilar posttreatment changes. Multifocal patchy opacities, suspicious for pneumonia when correlating with prior CT. Trace right pleural effusion. No pneumothorax. HEART AND MEDIASTINUM: No acute abnormality of the cardiac and mediastinal silhouettes. BONES AND SOFT TISSUES: No acute osseous abnormality. IMPRESSION: 1. Multifocal patchy opacities, suspicious for pneumonia. 2. Trace right pleural effusion. Electronically signed by: Pinkie Pebbles MD 04/22/2024 03:56 AM EST RP Workstation: [id number]   DG Chest Port 1 View Result Date: 04/22/2024 EXAM: 1 VIEW XRAY OF THE CHEST 04/22/2024 03:46:02 AM COMPARISON: CT chest dated 04/02/2024. CLINICAL HISTORY: Questionable sepsis. Evaluate for abnormality. FINDINGS: LUNGS AND PLEURA: Low lung volumes. Progressive  diffuse bilateral airspace opacities, left greater than right, suspicious for pneumonia. Small bilateral pleural effusions. Platelike radiation changes in right upper lobe with fiducial marker, residual tumor not excluded. No pneumothorax.  HEART AND MEDIASTINUM: No acute abnormality of the cardiac and mediastinal silhouettes. BONES AND SOFT TISSUES: No acute osseous abnormality. IMPRESSION: 1. Progressive multifocal pneumonia. Small bilateral pleural effusions. 2. Platelike right upper lobe radiation changes. Residual tumor is not excluded. Electronically signed by: Pinkie Pebbles MD 04/22/2024 03:54 AM EST RP Workstation: [id number]    Pending Labs Unresulted Labs (From admission, onward)     Start     Ordered   04/23/24 0500  Basic metabolic panel  Tomorrow morning,   R        04/22/24 0908   04/23/24 0500  CBC  Tomorrow morning,   R        04/22/24 0908   04/23/24 0500  Procalcitonin  Tomorrow morning,   R        04/22/24 0917   04/23/24 0500  Pro Brain natriuretic peptide  Tomorrow morning,   R        04/22/24 0917   04/23/24 0500  Sedimentation rate  Tomorrow morning,   R        04/22/24 0917   04/23/24 0500  ANCA Titers  (Anti-Neutrophilic Cystoplasmic Antibody Panel (PNL))  Tomorrow morning,   R        04/22/24 0917   04/23/24 0500  Rheumatoid factor  Tomorrow morning,   R        04/22/24 0917   04/23/24 0500  CYCLIC CITRUL PEPTIDE ANTIBODY, IGG/IGA  Tomorrow morning,   R        04/22/24 0917   04/23/24 0500  Magnesium   Tomorrow morning,   R        04/22/24 0917   04/23/24 0500  Phosphorus  Tomorrow morning,   R        04/22/24 0917   04/22/24 0921  Hemoglobin A1c  (Glycemic Control (SSI)  Q 4 Hours / Glycemic Control (SSI)  AC +/- HS)  Once,   R       Comments: To assess prior glycemic control    04/22/24 0921   04/22/24 0919  MRSA Next Gen by PCR, Nasal  Once,   R        04/22/24 0921   04/22/24 0911  Legionella Pneumophila Serogp 1 Ur Ag  Once,   R        04/22/24 0917   04/22/24 0911  Strep pneumoniae urinary antigen  Once,   R        04/22/24 0917   04/22/24 0911  Respiratory (~20 pathogens) panel by PCR  (Respiratory panel by PCR (~20 pathogens, ~24 hr TAT)  w precautions)  Once,   R        04/22/24  0917            Vitals/Pain Today's Vitals   04/22/24 1123 04/22/24 1124 04/22/24 1130 04/22/24 1145  BP:   105/61 111/74  Pulse:   88 83  Resp:   (!) 29 (!) 23  Temp:      TempSrc:      SpO2:  98% 96% 99%  Weight:      Height:      PainSc: 0-No pain       Isolation Precautions Droplet precaution  Medications Medications  aspirin  EC tablet 81 mg (81 mg Oral Given 04/22/24 0940)  valACYclovir  (VALTREX ) tablet 500 mg (  0 mg Oral Hold 04/22/24 1006)  metoprolol  tartrate (LOPRESSOR ) tablet 25 mg (25 mg Oral Given 04/22/24 0941)  levothyroxine  (SYNTHROID ) tablet 50 mcg (0 mcg Oral Hold 04/22/24 1007)  tamsulosin  (FLOMAX ) capsule 0.4 mg (0.4 mg Oral Given 04/22/24 0940)  melatonin tablet 10 mg (has no administration in time range)  multivitamin with minerals tablet 1 tablet (1 tablet Oral Given 04/22/24 0940)  fluticasone  (FLONASE ) 50 MCG/ACT nasal spray 1 spray (has no administration in time range)  acetaminophen  (TYLENOL ) tablet 650 mg (650 mg Oral Given 04/22/24 0941)    Or  acetaminophen  (TYLENOL ) suppository 650 mg ( Rectal See Alternative 04/22/24 0941)  ondansetron  (ZOFRAN ) tablet 4 mg (has no administration in time range)    Or  ondansetron  (ZOFRAN ) injection 4 mg (has no administration in time range)  dexmedetomidine  (PRECEDEX ) 400 MCG/100ML (4 mcg/mL) infusion (0 mcg/kg/hr  91.6 kg Intravenous Hold 04/22/24 1006)  Chlorhexidine  Gluconate Cloth 2 % PADS 6 each (has no administration in time range)  polyethylene glycol (MIRALAX  / GLYCOLAX ) packet 17 g (has no administration in time range)  senna (SENOKOT) tablet 8.6 mg (has no administration in time range)  dextrose  5 % and 0.45 % NaCl infusion (has no administration in time range)  pantoprazole  (PROTONIX ) EC tablet 40 mg (40 mg Oral Given 04/22/24 0940)  methylPREDNISolone  sodium succinate  (SOLU-MEDROL ) 125 mg/2 mL injection 125 mg (125 mg Intravenous Given 04/22/24 0955)  vancomycin  (VANCOREADY) IVPB 2000 mg/400 mL (has no  administration in time range)  vancomycin  (VANCOREADY) IVPB 1750 mg/350 mL (has no administration in time range)  lactated ringers  bolus 1,000 mL (0 mLs Intravenous Stopped 04/22/24 0918)  lactated ringers  bolus 1,000 mL (0 mLs Intravenous Stopped 04/22/24 0711)  cefTRIAXone  (ROCEPHIN ) 2 g in sodium chloride  0.9 % 100 mL IVPB (0 g Intravenous Stopped 04/22/24 0741)  iohexol  (OMNIPAQUE ) 350 MG/ML injection 75 mL (75 mLs Intravenous Contrast Given 04/22/24 0614)  furosemide  (LASIX ) injection 40 mg (40 mg Intravenous Given 04/22/24 0941)  ALPRAZolam  (XANAX ) tablet 0.25 mg (0.25 mg Oral Given 04/22/24 0940)  piperacillin -tazobactam (ZOSYN ) IVPB 3.375 g (3.375 g Intravenous New Bag/Given 04/22/24 1122)    Mobility Walks w/assist. On Bipap at this time     Focused Assessments Pulmonary Assessment Handoff:  Lung sounds: Bilateral Breath Sounds: Diminished L Breath Sounds: Diminished R Breath Sounds: Diminished O2 Device: Bi-PAP      R Recommendations: See Admitting Provider Note  Report given to:   Additional Notes:  Pt was struggling earlier with anxiety and breathing.  He is resting well at this time after medications and on current vent settings.       [1] No Known Allergies

## 2024-04-22 NOTE — ED Provider Triage Note (Signed)
 Emergency Medicine Provider Triage Evaluation Note  Peter Greene , a 84 y.o. male  was evaluated in triage.  Pt complains of SHOB, hemoptysis. Recently admitted to the hospital for bilateral pneumonia.  Was discharged on steroid taper.  Started to feel short of breath tonight, heart rate was elevated, history of A-fib and on Eliquis .  Then had coughing up of mucus with bright red streaks of blood.  Review of Systems  Positive: Shortness of breath, cough, hemoptysis, wheezing Negative: Fever, lower extremity edema  Physical Exam  BP (!) 171/89 (BP Location: Left Arm)   Pulse (!) 115   Temp 99 F (37.2 C)   Resp (!) 22   SpO2 (!) 63%  Gen:   Awake, no distress   Resp:  Tachypneic, speaks in short phrases, diminished  MSK:   Moves extremities without difficulty  Other:    Medical Decision Making  Medically screening exam initiated at 3:18 AM.  Appropriate orders placed.  Teofil Maniaci was informed that the remainder of the evaluation will be completed by another provider, this initial triage assessment does not replace that evaluation, and the importance of remaining in the ED until their evaluation is complete.  O2 sat 63% on arrival, placed on nasal cannula with improvement to upper 80s.  Request room and monitoring.   Beverley Leita LABOR, PA-C 04/22/24 0320

## 2024-04-22 NOTE — Progress Notes (Signed)
 eLink Physician-Brief Progress Note Patient Name: Peter Greene DOB: [birth year] MRN: [id number]   Date of Service  04/22/2024  HPI/Events of Note  Patient on precedex and Bipap.  Not able to take po meds at this time.  113/75, pulse 79  eICU Interventions  Hold metoprolol  tonight     Intervention Category Intermediate Interventions: OtherBETHA CLAUDENE AGENT, [name] 04/22/2024, 7:53 PM

## 2024-04-22 NOTE — ED Notes (Signed)
 Unable to collect second set of cultures, phlebotomy notified.

## 2024-04-23 ENCOUNTER — Inpatient Hospital Stay (HOSPITAL_COMMUNITY)

## 2024-04-23 DIAGNOSIS — G8929 Other chronic pain: Secondary | ICD-10-CM | POA: Diagnosis not present

## 2024-04-23 DIAGNOSIS — Z8546 Personal history of malignant neoplasm of prostate: Secondary | ICD-10-CM | POA: Diagnosis not present

## 2024-04-23 DIAGNOSIS — E039 Hypothyroidism, unspecified: Secondary | ICD-10-CM | POA: Diagnosis not present

## 2024-04-23 DIAGNOSIS — I4891 Unspecified atrial fibrillation: Secondary | ICD-10-CM | POA: Diagnosis not present

## 2024-04-23 DIAGNOSIS — J9601 Acute respiratory failure with hypoxia: Secondary | ICD-10-CM | POA: Diagnosis not present

## 2024-04-23 DIAGNOSIS — Z85118 Personal history of other malignant neoplasm of bronchus and lung: Secondary | ICD-10-CM | POA: Diagnosis not present

## 2024-04-23 DIAGNOSIS — K219 Gastro-esophageal reflux disease without esophagitis: Secondary | ICD-10-CM | POA: Diagnosis not present

## 2024-04-23 DIAGNOSIS — I5030 Unspecified diastolic (congestive) heart failure: Secondary | ICD-10-CM | POA: Diagnosis not present

## 2024-04-23 DIAGNOSIS — E785 Hyperlipidemia, unspecified: Secondary | ICD-10-CM | POA: Diagnosis not present

## 2024-04-23 DIAGNOSIS — D649 Anemia, unspecified: Secondary | ICD-10-CM | POA: Diagnosis not present

## 2024-04-23 DIAGNOSIS — J189 Pneumonia, unspecified organism: Secondary | ICD-10-CM | POA: Diagnosis not present

## 2024-04-23 LAB — CBC
HCT: 28.5 % — ABNORMAL LOW (ref 39.0–52.0)
Hemoglobin: 9.5 g/dL — ABNORMAL LOW (ref 13.0–17.0)
MCH: 30.3 pg (ref 26.0–34.0)
MCHC: 33.3 g/dL (ref 30.0–36.0)
MCV: 90.8 fL (ref 80.0–100.0)
Platelets: 251 10*3/uL (ref 150–400)
RBC: 3.14 MIL/uL — ABNORMAL LOW (ref 4.22–5.81)
RDW: 17.6 % — ABNORMAL HIGH (ref 11.5–15.5)
WBC: 6.6 10*3/uL (ref 4.0–10.5)
nRBC: 0.3 % — ABNORMAL HIGH (ref 0.0–0.2)

## 2024-04-23 LAB — TROPONIN T, HIGH SENSITIVITY: Troponin T High Sensitivity: 21 ng/L — ABNORMAL HIGH (ref 0–19)

## 2024-04-23 LAB — GLUCOSE, CAPILLARY: Glucose-Capillary: 138 mg/dL — ABNORMAL HIGH (ref 70–99)

## 2024-04-23 LAB — BASIC METABOLIC PANEL WITH GFR
Anion gap: 13 (ref 5–15)
BUN: 27 mg/dL — ABNORMAL HIGH (ref 8–23)
CO2: 20 mmol/L — ABNORMAL LOW (ref 22–32)
Calcium: 8.6 mg/dL — ABNORMAL LOW (ref 8.9–10.3)
Chloride: 103 mmol/L (ref 98–111)
Creatinine, Ser: 0.95 mg/dL (ref 0.61–1.24)
GFR, Estimated: >60 mL/min
Glucose, Bld: 146 mg/dL — ABNORMAL HIGH (ref 70–99)
Potassium: 4.3 mmol/L (ref 3.5–5.1)
Sodium: 136 mmol/L (ref 135–145)

## 2024-04-23 LAB — PHOSPHORUS: Phosphorus: 3.6 mg/dL (ref 2.5–4.6)

## 2024-04-23 LAB — MAGNESIUM: Magnesium: 2.3 mg/dL (ref 1.7–2.4)

## 2024-04-23 LAB — PROCALCITONIN: Procalcitonin: 1.28 ng/mL

## 2024-04-23 LAB — PRO BRAIN NATRIURETIC PEPTIDE: Pro Brain Natriuretic Peptide: 2139 pg/mL — ABNORMAL HIGH

## 2024-04-23 LAB — SEDIMENTATION RATE: Sed Rate: 60 mm/h — ABNORMAL HIGH (ref 0–16)

## 2024-04-23 MED ORDER — IPRATROPIUM-ALBUTEROL 0.5-2.5 (3) MG/3ML IN SOLN
3.0000 mL | RESPIRATORY_TRACT | Status: DC | PRN
  Administered 2024-04-23: 3 mL via RESPIRATORY_TRACT
  Filled 2024-04-23: qty 3

## 2024-04-23 MED ORDER — METOPROLOL TARTRATE 5 MG/5ML IV SOLN: 5.0000 mg | Freq: Two times a day (BID) | INTRAVENOUS | Status: DC

## 2024-04-23 MED ORDER — FUROSEMIDE 10 MG/ML IJ SOLN
40.0000 mg | Freq: Once | INTRAMUSCULAR | Status: AC
  Administered 2024-04-23: 40 mg via INTRAVENOUS
  Filled 2024-04-23: qty 4

## 2024-04-23 MED ORDER — METOPROLOL TARTRATE 5 MG/5ML IV SOLN
5.0000 mg | INTRAVENOUS | Status: DC | PRN
  Administered 2024-04-23 – 2024-04-24 (×2): 5 mg via INTRAVENOUS
  Filled 2024-04-23 (×2): qty 5

## 2024-04-23 MED ORDER — METOPROLOL TARTRATE 5 MG/5ML IV SOLN: 5.0000 mg | Freq: Three times a day (TID) | INTRAVENOUS | Status: DC | PRN

## 2024-04-23 MED ORDER — ALPRAZOLAM 0.5 MG PO TABS: 0.2500 mg | ORAL_TABLET | Freq: Three times a day (TID) | ORAL | Status: DC | PRN

## 2024-04-23 MED ORDER — METOPROLOL TARTRATE 5 MG/5ML IV SOLN: 25.0000 mg | Freq: Two times a day (BID) | INTRAVENOUS | Status: DC

## 2024-04-23 NOTE — Progress Notes (Addendum)
 "   NAME:  Peter Greene, MRN:  [id number], DOB:  09-06-1940, LOS: 1 ADMISSION DATE:  04/22/2024, CONSULTATION DATE:  04/22/2024  REFERRING MD:  Triad MD and EDP, CHIEF COMPLAINT:  Acute respiratory failure recurrent    History of Present Illness:-Discussion with the wife and also patient and review of the external medical records.  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  This baseline has been quite functional without requiring any oxygen and is followed in pulmonary clinic with Dr. [name].  September 2025 FVC 2.4 L or 65% and DLCO 12.7/55% and largely stable compared to 2023.  He also has grade 2 diastolic dysfunction.  Admitted 03/28/2024 through 04/05/2024 with parainfluenza virus 3 that based on chart review and CT review course worsening respiratory failure requiring oxygen at discharge and a prolonged prednisone  taper.  Echo ef 55% baselinebut GRADe 2 DDX According to the wife he recovered from this and came off oxygen at home.  He was undergoing home physical therapy and doing pretty well.  He came off his prednisone  taper on 04/19/2024.  Presented acutely to pulmonary clinic on 04/20/2024 1 day after coming off prednisone  with acute worsening respiratory distress and desaturations at home.  No fever or chills.  Consideration for prednisone  rebound was given and patient was restarted on prednisone  in the hospital but he is continue to deteriorate and ended up in the ER where he is on 80% FiO2 on  BiPAP but compensating quite well and alert and oriented.  CT scan shows progressive bilateral lower lobe consolidation compared to earlier in January 2026 and definitely worse compared to 1 month earlier.  Wife at bedside and critical care medicine called for consultation and Jolynn Pack, ER bed 32.  He has an advanced directive and wife said he is full code.  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   reports that he quit smoking about 42 years ago. His smoking use included cigarettes. He has never been exposed to tobacco smoke. He has never used smokeless tobacco.  Past Surgical History:  Procedure Laterality Date   ABDOMINAL AORTIC ANEURYSM REPAIR     BONE MARROW TRANSPLANT     BRONCHIAL BIOPSY  04/17/2022   Procedure: BRONCHIAL BIOPSIES;  Surgeon: Gladis Leonor HERO, MD;  Location: [facility] ENDOSCOPY;  Service: Pulmonary;;   BRONCHIAL BIOPSY  06/19/2022   Procedure: BRONCHIAL BIOPSIES;  Surgeon: Gladis Leonor HERO, MD;  Location: [facility] ENDOSCOPY;  Service: Pulmonary;;   BRONCHIAL BRUSHINGS  04/17/2022   Procedure: BRONCHIAL BRUSHINGS;  Surgeon: Gladis Leonor HERO, MD;  Location: [facility]  ENDOSCOPY;  Service: Pulmonary;;   BRONCHIAL NEEDLE ASPIRATION BIOPSY  04/17/2022   Procedure: BRONCHIAL NEEDLE ASPIRATION BIOPSIES;  Surgeon: Gladis Leonor HERO, MD;  Location: [facility] ENDOSCOPY;  Service: Pulmonary;;   BRONCHIAL  NEEDLE ASPIRATION BIOPSY  06/19/2022   Procedure: BRONCHIAL NEEDLE ASPIRATION BIOPSIES;  Surgeon: Gladis Leonor HERO, MD;  Location: [facility] ENDOSCOPY;  Service: Pulmonary;;   BRONCHIAL WASHINGS  04/17/2022   Procedure: BRONCHIAL WASHINGS;  Surgeon: Gladis Leonor HERO, MD;  Location: [facility] ENDOSCOPY;  Service: Pulmonary;;   BRONCHIAL WASHINGS  06/19/2022   Procedure: BRONCHIAL WASHINGS;  Surgeon: Gladis Leonor HERO, MD;  Location: [facility] ENDOSCOPY;  Service: Pulmonary;;   CARDIAC CATHETERIZATION     ENDARTERECTOMY Left 01/30/2022   Procedure: ENDARTERECTOMY CAROTID;  Surgeon: Lanis Fonda BRAVO, MD;  Location: [facility] OR;  Service: Vascular;  Laterality: Left;   EYE SURGERY     FIDUCIAL MARKER PLACEMENT  06/19/2022   Procedure: FIDUCIAL MARKER PLACEMENT;  Surgeon: Gladis Leonor HERO, MD;  Location: [facility] ENDOSCOPY;  Service: Pulmonary;;   HERNIA REPAIR     KNEE SURGERY Bilateral    Joint replacements   MOHS SURGERY  2024   x 2 R & L   PATCH ANGIOPLASTY Left 01/30/2022   Procedure: PATCH ANGIOPLASTY WITH XENOSURE BIOLOGIC PATCH [id number];  Surgeon: Lanis Fonda BRAVO, MD;  Location: [facility] OR;  Service: Vascular;  Laterality: Left;   PROSTATE BIOPSY     TONSILLECTOMY  1946   VIDEO BRONCHOSCOPY WITH RADIAL ENDOBRONCHIAL ULTRASOUND  04/17/2022   Procedure: VIDEO BRONCHOSCOPY WITH RADIAL ENDOBRONCHIAL ULTRASOUND;  Surgeon: Gladis Leonor HERO, MD;  Location: [facility] ENDOSCOPY;  Service: Pulmonary;;   VIDEO BRONCHOSCOPY WITH RADIAL ENDOBRONCHIAL ULTRASOUND  06/19/2022   Procedure: VIDEO BRONCHOSCOPY WITH RADIAL ENDOBRONCHIAL ULTRASOUND;  Surgeon: Gladis Leonor HERO, MD;  Location: [facility] ENDOSCOPY;  Service: Pulmonary;;    Allergies[1]  Immunization History  Administered Date(s) Administered   DTaP 05/06/2019, 07/01/2019, 08/26/2019   Fluad Quad(high Dose 65+) 12/02/2021   Fluad Trivalent(High Dose 65+) 12/11/2022   HIB, Unspecified 05/06/2019, 07/01/2019, 10/21/2019   Hepatitis B, ADULT 05/06/2019, 07/01/2019, 10/21/2019    INFLUENZA, HIGH DOSE SEASONAL PF 12/28/2015   IPV 05/06/2019, 07/01/2019, 10/21/2019   Influenza, Seasonal, Injecte, Preservative Fre 01/01/2012   Pneumococcal Conjugate-13 01/04/2014, 05/06/2019, 07/01/2019, 08/26/2019   Pneumococcal Polysaccharide-23 10/21/2019   Zoster Recombinant(Shingrix) 08/26/2019, 10/21/2019    History reviewed. No pertinent family history.  Current Medications[2]     Significant Hospital Events:  04/22/2024 - admit   Interim History / Subjective:   04/23/2024 - seen in [place], ER at 32  Objective   Blood pressure 119/85, pulse 70, temperature (!) 96.9 F (36.1 C), temperature source Axillary, resp. rate 20, height 5' 8 (1.727 m), weight 91.6 kg, SpO2 (!) 89%.    FiO2 (%):  [60 %-90 %] 70 % PEEP:  [6 cmH20-8 cmH20] 8 cmH20   Intake/Output Summary (Last 24 hours) at 04/23/2024 0724 Last data filed at 04/23/2024 0547 Gross per 24 hour  Intake 1231.82 ml  Output 1660 ml  Net -428.18 ml   Filed Weights   04/22/24 0319  Weight: 91.6 kg    Examination: General: in bed on BiPAP, minimal distress HEENT: NCAT Lungs: Symmetrical chest wall movement, bilateral crackles and wheeze CVS: Tachycardic Abdomen: Soft nontender nondistended Extremities: Warm, no edema Neuro: AO X3, moving all limbs GU: Deferred   Resolved Hospital Problem list     Assessment & Plan:   84 year old male history of lung CA s/p SBRT-2024, ongoing prostate CA, previous laryngeal CA,  A-fib, myelodysplastic syndrome s/p BMT-2020 at [facility] c/b graft versus host dx, chronic ILD since 2015 (remotely treated with prednisone -no biopsy present), allergies requiring oxygen and followed by pulmonary clinic Dr. [name] (9/25-FVC 2.4 L / 65%, DLCO 12.7/55%-stable compared to 2023), grade 2 diastolic dysfunction. 12/20 9/25 - 04/05/2024-admitted with parainfluenza virus CT worsening-requiring prolonged prednisone  taper on home oxygen.  Presented to the clinic 04/20/2024 after coming  off prednisone  with acute worsening respiratory distress and desaturation at home.  This was thought to be rebound, and restarted on prednisone , but continued to worsen and showed up to the ER now requiring 80% FiO2 on BiPAP. CT shows progressive B/L lower lobe consolidation compared to May 10, 2024  Acute hypoxemic respiratory failure-on 80% FiO2 BiPAP CT showing worsening bilateral lower lobe consolidation-PE ruled out Hemoptysis-likely due to inflammation, being on Eliquis  HCAP HFpEF-grade 2 diastolic dysfunction 12/25 A-fib on Eliquis  and Lopressor  Chronic pain on gabapentin  Hyperlipidemia on omega-3, Krill oil and Lipitor-Will hold this at this time History of prostate cancer-on Relugolix -not taking per wife Lactic acidosis-resolved GERD-on home PPI Chronic anemia Hypothyroid-on Synthroid 's History of laryngeal CA History of localized lung CA with empiric SBRT 2024   Plan - Wean off BiPAP as tolerated to maintain sats> 88% - Continuous pulse ox, cardiac monitoring - Breathing treatment, steroid - Continue antibiotics Vanco and Zosyn  - Follow-up respiratory panel - Pro-Cal 1.28, follow-up other infectious workup - Wean off Precedex  as tolerated - Follow-up on echo - Continue diuresis - Holding Eliquis  for hemoptysis, will reassess daily - Serial H&H, - Monitor and replace electrolyte as needed     Wife updated at bedside    LABS    PULMONARY No results for input(s): PHART, PCO2ART, PO2ART, HCO3, TCO2, O2SAT in the last 168 hours.  Invalid input(s): PCO2, PO2  CBC Recent Labs  Lab 04/22/24 0335 04/23/24 0357  HGB 10.5* 9.5*  HCT 32.4* 28.5*  WBC 9.2 6.6  PLT 282 251    COAGULATION Recent Labs  Lab 04/22/24 0335  INR 1.3*    CARDIAC  No results for input(s): TROPONINI in the last 168 hours. Recent Labs  Lab 04/22/24 0335 04/23/24 0357  PROBNP 1,673.0* 2,139.0*    CHEMISTRY Recent Labs  Lab 04/22/24 0335 04/23/24 0357  NA  135 136  K 4.2 4.3  CL 100 103  CO2 21* 20*  GLUCOSE 139* 146*  BUN 25* 27*  CREATININE 0.98 0.95  CALCIUM  8.8* 8.6*  MG  --  2.3  PHOS  --  3.6   Estimated Creatinine Clearance: 67 mL/min (by C-G formula based on SCr of 0.95 mg/dL).   LIVER Recent Labs  Lab 04/22/24 0335  AST 34  ALT 36  ALKPHOS 85  BILITOT 0.4  PROT 7.4  ALBUMIN 4.1  INR 1.3*     INFECTIOUS Recent Labs  Lab 04/22/24 0345 04/22/24 0510 04/23/24 0357  LATICACIDVEN 3.2* 1.8  --   PROCALCITON  --   --  1.28     ENDOCRINE CBG (last 3)  Recent Labs    04/22/24 1421  GLUCAP 128*         IMAGING x48h  - image(s) personally visualized  -   highlighted in bold CT Angio Chest PE W and/or Wo Contrast Result Date: 04/22/2024 EXAM: CTA of the Chest with contrast for PE 04/22/2024 06:13:48 AM TECHNIQUE: CTA of the chest was performed after the administration of intravenous contrast. Multiplanar reformatted images are provided for review. MIP images are provided for review. Automated  exposure control, iterative reconstruction, and/or weight based adjustment of the mA/kV was utilized to reduce the radiation dose to as low as reasonably achievable. COMPARISON: CTA chest 04/02/2024. Restaging CT in August last year. CLINICAL HISTORY: 84 year old male with hemoptysis, acute shortness of breath, atrial fibrillation on blood thinner, recent pneumonia, and treated small cell lung cancer. FINDINGS: PULMONARY ARTERIES: Pulmonary arteries are adequately opacified for evaluation. No pulmonary embolism. Main pulmonary artery is normal in caliber. MEDIASTINUM: Little to no contrast in the aorta. Stable aortic atherosclerosis and tortuosity. Calcified coronary artery atherosclerosis. Heart size remains normal. The pericardium demonstrates no acute abnormality. LYMPH NODES: No mediastinal, hilar or axillary lymphadenopathy. LUNGS AND PLEURA: Elevation of the right hemidiaphragm but lower lung volumes compared to baseline.  Likely abnormal right hilum and upper lobe suggesting previously biopsied and radiated lung lesion, stable appearance including regional bronchiectasis and radiation pneumonitis changes compared to restaging CT in August last year. Since that time, extensive bilateral combined subsolid and early consolidative lung opacity in all lobes. Bilateral lower lobes opacification has progressed from the CTA earlier this month. Confluent left upper lobe opacities not significantly changed. Small layering right pleural effusion stable from last month. No pneumothorax. Differential considerations include aggressive bilateral pneumonia, drug reaction or toxicity, and unlikely to be asymmetric edema. UPPER ABDOMEN: Mildly nodular liver contour in the upper abdomen. Otherwise stable and negative visible non-contrast upper abdominal viscera. SOFT TISSUES AND BONES: No acute bone or soft tissue abnormality. IMPRESSION: 1. No pulmonary embolism. 2. Extensive bilateral lung opacities, subsolid and early consolidative, and progressed in both lower lobes since 04/02/2024. Top differential considerations include progression bilateral pneumonia, drug reaction or toxicity, other non-infectious inflammation. Stable small layering right pleural effusion, lung changes felt unlikely to be pulmonary edema. 3. Superimposed Chronic post-treatment changes of right hilar lung cancer. 4. Aortic Atherosclerosis (ICD10-I70.0). 5. Questionable visible liver contour nodularity, cirrhosis. Electronically signed by: Helayne Hurst MD 04/22/2024 06:57 AM EST RP Workstation: [id number]   DG Chest Port 1 View Result Date: 04/22/2024 EXAM: 1 VIEW XRAY OF THE CHEST 04/22/2024 03:46:02 AM COMPARISON: CT chest dated 04/02/2024. CLINICAL HISTORY: Questionable sepsis. Evaluate for abnormality. FINDINGS: LUNGS AND PLEURA: Low lung volumes. Progressive diffuse bilateral airspace opacities, left greater than right, suspicious for pneumonia. Small bilateral pleural  effusions. Platelike radiation changes in right upper lobe with fiducial marker, residual tumor not excluded. No pneumothorax. HEART AND MEDIASTINUM: No acute abnormality of the cardiac and mediastinal silhouettes. BONES AND SOFT TISSUES: No acute osseous abnormality. IMPRESSION: 1. Progressive multifocal pneumonia. Small bilateral pleural effusions. 2. Platelike right upper lobe radiation changes. Residual tumor is not excluded. Electronically signed by: Pinkie Pebbles MD 04/22/2024 03:54 AM EST RP Workstation: [id number]      CRITICAL CARE  Lenny Drought, MD  [facility] Prefer epic messenger for cross cover needs   My critical care time: 40 minutes  Critical care time was exclusive of separately billable procedures and treating other patients.  Critical care was necessary to treat or prevent imminent or life-threatening deterioration.  Critical care was time spent personally by me on the following activities: development of treatment plan with patient and/or surrogate as well as nursing, discussions with consultants, evaluation of patient's response to treatment, examination of patient, obtaining history from patient or surrogate, ordering and performing treatments and interventions, ordering and review of laboratory studies, ordering and review of radiographic studies, pulse oximetry, re-evaluation of patient's condition and participation in multidisciplinary rounds.       [1]  No Known Allergies [2]  Current Facility-Administered Medications:    acetaminophen  (TYLENOL ) tablet 650 mg, 650 mg, Oral, Q6H PRN, 650 mg at 04/22/24 0941 **OR** acetaminophen  (TYLENOL ) suppository 650 mg, 650 mg, Rectal, Q6H PRN, Arrien, Elidia Sieving, MD   aspirin  EC tablet 81 mg, 81 mg, Oral, q AM, Arrien, Mauricio Daniel, MD, 81 mg at 04/22/24 0940   Chlorhexidine  Gluconate Cloth 2 % PADS 6 each, 6 each, Topical, Daily, Geronimo Amel, MD, 6 each at 04/22/24 1448    dexmedetomidine  (PRECEDEX ) 400 MCG/100ML (4 mcg/mL) infusion, 0-1.2 mcg/kg/hr, Intravenous, Titrated, Ramaswamy, Murali, MD, Last Rate: 9.16 mL/hr at 04/23/24 0500, 0.4 mcg/kg/hr at 04/23/24 0500   dextrose  5 % and 0.45 % NaCl infusion, , Intravenous, Continuous, Ramaswamy, Murali, MD, Last Rate: 10 mL/hr at 04/23/24 0500, Infusion Verify at 04/23/24 0500   fluticasone  (FLONASE ) 50 MCG/ACT nasal spray 1 spray, 1 spray, Each Nare, Daily, Arrien, Mauricio Daniel, MD   levothyroxine  (SYNTHROID ) tablet 50 mcg, 50 mcg, Oral, Daily, Arrien, Mauricio Daniel, MD   melatonin tablet 10 mg, 10 mg, Oral, PRN, Arrien, Mauricio Daniel, MD   methylPREDNISolone  sodium succinate  (SOLU-MEDROL ) 125 mg/2 mL injection 125 mg, 125 mg, Intravenous, Q6H, Ramaswamy, Murali, MD, 125 mg at 04/23/24 0400   metoprolol  tartrate (LOPRESSOR ) tablet 25 mg, 25 mg, Oral, BID, Arrien, Mauricio Daniel, MD, 25 mg at 04/22/24 0941   multivitamin with minerals tablet 1 tablet, 1 tablet, Oral, q AM, Arrien, Mauricio Daniel, MD, 1 tablet at 04/22/24 0940   ondansetron  (ZOFRAN ) tablet 4 mg, 4 mg, Oral, Q6H PRN **OR** ondansetron  (ZOFRAN ) injection 4 mg, 4 mg, Intravenous, Q6H PRN, Arrien, Elidia Sieving, MD   pantoprazole  (PROTONIX ) EC tablet 40 mg, 40 mg, Oral, Daily, Ramaswamy, Murali, MD, 40 mg at 04/22/24 0940   piperacillin -tazobactam (ZOSYN ) IVPB 3.375 g, 3.375 g, Intravenous, Q8H, Ramaswamy, Murali, MD, Last Rate: 12.5 mL/hr at 04/23/24 0500, Infusion Verify at 04/23/24 0500   polyethylene glycol (MIRALAX  / GLYCOLAX ) packet 17 g, 17 g, Oral, Daily PRN, Ramaswamy, Murali, MD   senna (SENOKOT) tablet 8.6 mg, 1 tablet, Oral, BID PRN, Geronimo Amel, MD   tamsulosin  (FLOMAX ) capsule 0.4 mg, 0.4 mg, Oral, q AM, Arrien, Mauricio Daniel, MD, 0.4 mg at 04/22/24 0940   valACYclovir  (VALTREX ) tablet 500 mg, 500 mg, Oral, q AM, Arrien, Mauricio Daniel, MD   vancomycin  (VANCOREADY) IVPB 1750 mg/350 mL, 1,750 mg, Intravenous, Q24H, Geronimo Amel, MD  "

## 2024-04-23 NOTE — Plan of Care (Signed)
   Problem: Clinical Measurements: Goal: Ability to maintain clinical measurements within normal limits will improve Outcome: Progressing Goal: Respiratory complications will improve Outcome: Progressing

## 2024-04-23 NOTE — Plan of Care (Signed)
" °  Problem: Fluid Volume: Goal: Ability to maintain a balanced intake and output will improve Outcome: Progressing   Problem: Nutritional: Goal: Maintenance of adequate nutrition will improve Outcome: Not Progressing   Problem: Skin Integrity: Goal: Risk for impaired skin integrity will decrease Outcome: Progressing   Problem: Tissue Perfusion: Goal: Adequacy of tissue perfusion will improve Outcome: Progressing   Problem: Clinical Measurements: Goal: Ability to maintain clinical measurements within normal limits will improve Outcome: Progressing Goal: Will remain free from infection Outcome: Progressing Goal: Diagnostic test results will improve Outcome: Progressing Goal: Respiratory complications will improve Outcome: Progressing Goal: Cardiovascular complication will be avoided Outcome: Progressing   "

## 2024-04-24 ENCOUNTER — Inpatient Hospital Stay (HOSPITAL_COMMUNITY)

## 2024-04-24 DIAGNOSIS — J849 Interstitial pulmonary disease, unspecified: Secondary | ICD-10-CM

## 2024-04-24 DIAGNOSIS — I4891 Unspecified atrial fibrillation: Secondary | ICD-10-CM | POA: Diagnosis not present

## 2024-04-24 DIAGNOSIS — R579 Shock, unspecified: Secondary | ICD-10-CM

## 2024-04-24 DIAGNOSIS — I5030 Unspecified diastolic (congestive) heart failure: Secondary | ICD-10-CM

## 2024-04-24 DIAGNOSIS — I469 Cardiac arrest, cause unspecified: Secondary | ICD-10-CM

## 2024-04-24 DIAGNOSIS — E039 Hypothyroidism, unspecified: Secondary | ICD-10-CM | POA: Diagnosis not present

## 2024-04-24 DIAGNOSIS — N179 Acute kidney failure, unspecified: Secondary | ICD-10-CM

## 2024-04-24 DIAGNOSIS — E875 Hyperkalemia: Secondary | ICD-10-CM | POA: Diagnosis not present

## 2024-04-24 DIAGNOSIS — J9622 Acute and chronic respiratory failure with hypercapnia: Secondary | ICD-10-CM

## 2024-04-24 DIAGNOSIS — E785 Hyperlipidemia, unspecified: Secondary | ICD-10-CM | POA: Diagnosis not present

## 2024-04-24 DIAGNOSIS — J9621 Acute and chronic respiratory failure with hypoxia: Secondary | ICD-10-CM

## 2024-04-24 LAB — TROPONIN T, HIGH SENSITIVITY: Troponin T High Sensitivity: 242 ng/L (ref 0–19)

## 2024-04-24 LAB — BLOOD GAS, ARTERIAL
Acid-base deficit: 13.2 mmol/L — ABNORMAL HIGH (ref 0.0–2.0)
Bicarbonate: 16.1 mmol/L — ABNORMAL LOW (ref 20.0–28.0)
O2 Saturation: 95.6 %
Patient temperature: 37
pCO2 arterial: 53 mmHg — ABNORMAL HIGH (ref 32–48)
pH, Arterial: 7.09 — CL (ref 7.35–7.45)
pO2, Arterial: 86 mmHg (ref 83–108)

## 2024-04-24 LAB — POCT I-STAT 7, (LYTES, BLD GAS, ICA,H+H)
Acid-base deficit: 14 mmol/L — ABNORMAL HIGH (ref 0.0–2.0)
Bicarbonate: 17.3 mmol/L — ABNORMAL LOW (ref 20.0–28.0)
Calcium, Ion: 0.97 mmol/L — ABNORMAL LOW (ref 1.15–1.40)
HCT: 30 % — ABNORMAL LOW (ref 39.0–52.0)
Hemoglobin: 10.2 g/dL — ABNORMAL LOW (ref 13.0–17.0)
O2 Saturation: 85 %
Patient temperature: 95
Potassium: 7.1 mmol/L (ref 3.5–5.1)
Sodium: 140 mmol/L (ref 135–145)
TCO2: 19 mmol/L — ABNORMAL LOW (ref 22–32)
pCO2 arterial: 60 mmHg — ABNORMAL HIGH (ref 32–48)
pH, Arterial: 7.053 — CL (ref 7.35–7.45)
pO2, Arterial: 64 mmHg — ABNORMAL LOW (ref 83–108)

## 2024-04-24 LAB — COMPREHENSIVE METABOLIC PANEL WITH GFR
ALT: >7000 U/L — ABNORMAL HIGH (ref 0–44)
AST: >7000 U/L — ABNORMAL HIGH (ref 15–41)
Albumin: 2.9 g/dL — ABNORMAL LOW (ref 3.5–5.0)
Alkaline Phosphatase: 149 U/L — ABNORMAL HIGH (ref 38–126)
Anion gap: 30 — ABNORMAL HIGH (ref 5–15)
BUN: 51 mg/dL — ABNORMAL HIGH (ref 8–23)
CO2: 12 mmol/L — ABNORMAL LOW (ref 22–32)
Calcium: 8.6 mg/dL — ABNORMAL LOW (ref 8.9–10.3)
Chloride: 105 mmol/L (ref 98–111)
Creatinine, Ser: 2.49 mg/dL — ABNORMAL HIGH (ref 0.61–1.24)
GFR, Estimated: 25 mL/min — ABNORMAL LOW
Glucose, Bld: 169 mg/dL — ABNORMAL HIGH (ref 70–99)
Potassium: 4.8 mmol/L (ref 3.5–5.1)
Sodium: 148 mmol/L — ABNORMAL HIGH (ref 135–145)
Total Bilirubin: 4 mg/dL — ABNORMAL HIGH (ref 0.0–1.2)
Total Protein: 5.5 g/dL — ABNORMAL LOW (ref 6.5–8.1)

## 2024-04-24 LAB — CBC
HCT: 32.3 % — ABNORMAL LOW (ref 39.0–52.0)
HCT: 31.4 % — ABNORMAL LOW (ref 39.0–52.0)
Hemoglobin: 9.9 g/dL — ABNORMAL LOW (ref 13.0–17.0)
Hemoglobin: 9.5 g/dL — ABNORMAL LOW (ref 13.0–17.0)
MCH: 29.6 pg (ref 26.0–34.0)
MCH: 30 pg (ref 26.0–34.0)
MCHC: 30.7 g/dL (ref 30.0–36.0)
MCHC: 30.3 g/dL (ref 30.0–36.0)
MCV: 96.7 fL (ref 80.0–100.0)
MCV: 99.1 fL (ref 80.0–100.0)
Platelets: 251 10*3/uL (ref 150–400)
Platelets: 206 10*3/uL (ref 150–400)
RBC: 3.34 MIL/uL — ABNORMAL LOW (ref 4.22–5.81)
RBC: 3.17 MIL/uL — ABNORMAL LOW (ref 4.22–5.81)
RDW: 17.9 % — ABNORMAL HIGH (ref 11.5–15.5)
RDW: 18.3 % — ABNORMAL HIGH (ref 11.5–15.5)
WBC: 7.5 10*3/uL (ref 4.0–10.5)
WBC: 3.8 10*3/uL — ABNORMAL LOW (ref 4.0–10.5)
nRBC: 4 % — ABNORMAL HIGH (ref 0.0–0.2)
nRBC: 7.7 % — ABNORMAL HIGH (ref 0.0–0.2)

## 2024-04-24 LAB — ECHOCARDIOGRAM COMPLETE
Height: 68 in
Weight: 3231.06 [oz_av]

## 2024-04-24 LAB — PHOSPHORUS: Phosphorus: 9.9 mg/dL — ABNORMAL HIGH (ref 2.5–4.6)

## 2024-04-24 LAB — BASIC METABOLIC PANEL WITH GFR
Anion gap: 25 — ABNORMAL HIGH (ref 5–15)
Anion gap: 24 — ABNORMAL HIGH (ref 5–15)
BUN: 50 mg/dL — ABNORMAL HIGH (ref 8–23)
BUN: 49 mg/dL — ABNORMAL HIGH (ref 8–23)
CO2: 14 mmol/L — ABNORMAL LOW (ref 22–32)
CO2: 17 mmol/L — ABNORMAL LOW (ref 22–32)
Calcium: 7.5 mg/dL — ABNORMAL LOW (ref 8.9–10.3)
Calcium: 8.8 mg/dL — ABNORMAL LOW (ref 8.9–10.3)
Chloride: 104 mmol/L (ref 98–111)
Chloride: 106 mmol/L (ref 98–111)
Creatinine, Ser: 1.9 mg/dL — ABNORMAL HIGH (ref 0.61–1.24)
Creatinine, Ser: 2.28 mg/dL — ABNORMAL HIGH (ref 0.61–1.24)
GFR, Estimated: 35 mL/min — ABNORMAL LOW
GFR, Estimated: 28 mL/min — ABNORMAL LOW
Glucose, Bld: 113 mg/dL — ABNORMAL HIGH (ref 70–99)
Glucose, Bld: 150 mg/dL — ABNORMAL HIGH (ref 70–99)
Potassium: 7.4 mmol/L (ref 3.5–5.1)
Potassium: 4.9 mmol/L (ref 3.5–5.1)
Sodium: 143 mmol/L (ref 135–145)
Sodium: 147 mmol/L — ABNORMAL HIGH (ref 135–145)

## 2024-04-24 LAB — GLUCOSE, CAPILLARY
Glucose-Capillary: 99 mg/dL (ref 70–99)
Glucose-Capillary: 99 mg/dL (ref 70–99)
Glucose-Capillary: 140 mg/dL — ABNORMAL HIGH (ref 70–99)

## 2024-04-24 LAB — POTASSIUM
Potassium: 4.8 mmol/L (ref 3.5–5.1)
Potassium: 4.6 mmol/L (ref 3.5–5.1)

## 2024-04-24 LAB — HEPATIC FUNCTION PANEL
ALT: 1260 U/L — ABNORMAL HIGH (ref 0–44)
AST: 1639 U/L — ABNORMAL HIGH (ref 15–41)
Albumin: 3.3 g/dL — ABNORMAL LOW (ref 3.5–5.0)
Alkaline Phosphatase: 112 U/L (ref 38–126)
Bilirubin, Direct: 1.7 mg/dL — ABNORMAL HIGH (ref 0.0–0.2)
Indirect Bilirubin: 1.2 mg/dL — ABNORMAL HIGH (ref 0.3–0.9)
Total Bilirubin: 2.8 mg/dL — ABNORMAL HIGH (ref 0.0–1.2)
Total Protein: 6.1 g/dL — ABNORMAL LOW (ref 6.5–8.1)

## 2024-04-24 LAB — TSH: TSH: 4.51 u[IU]/mL — ABNORMAL HIGH (ref 0.350–4.500)

## 2024-04-24 LAB — LACTIC ACID, PLASMA
Lactic Acid, Venous: >9 mmol/L (ref 0.5–1.9)
Lactic Acid, Venous: >9 mmol/L (ref 0.5–1.9)
Lactic Acid, Venous: >9 mmol/L (ref 0.5–1.9)
Lactic Acid, Venous: >9 mmol/L (ref 0.5–1.9)

## 2024-04-24 LAB — C-REACTIVE PROTEIN: CRP: 17.9 mg/dL — ABNORMAL HIGH

## 2024-04-24 LAB — MAGNESIUM: Magnesium: 2.9 mg/dL — ABNORMAL HIGH (ref 1.7–2.4)

## 2024-04-24 LAB — T4, FREE: Free T4: 0.69 ng/dL — ABNORMAL LOW (ref 0.80–2.00)

## 2024-04-24 MED ORDER — DOPAMINE-DEXTROSE 3.2-5 MG/ML-% IV SOLN
INTRAVENOUS | Status: AC
  Filled 2024-04-24: qty 250

## 2024-04-24 MED ORDER — PROPOFOL 1000 MG/100ML IV EMUL
INTRAVENOUS | Status: AC
  Filled 2024-04-24: qty 100

## 2024-04-24 MED ORDER — EPINEPHRINE HCL 5 MG/250ML IV SOLN IN NS
0.5000 ug/min | INTRAVENOUS | Status: DC
  Administered 2024-04-24: 15 ug/min via INTRAVENOUS
  Administered 2024-04-24: 20 ug/min via INTRAVENOUS
  Administered 2024-04-24: 15 ug/min via INTRAVENOUS
  Administered 2024-04-24: 0.5 ug/min via INTRAVENOUS
  Administered 2024-04-25 (×2): 20 ug/min via INTRAVENOUS
  Filled 2024-04-24 (×5): qty 250

## 2024-04-24 MED ORDER — HYDROCORTISONE SOD SUC (PF) 100 MG IJ SOLR: 100.0000 mg | Freq: Two times a day (BID) | INTRAMUSCULAR | Status: DC

## 2024-04-24 MED ORDER — INSULIN REGULAR(HUMAN) IN NACL 100-0.9 UT/100ML-% IV SOLN: INTRAVENOUS | Status: DC

## 2024-04-24 MED ORDER — LEVOTHYROXINE SODIUM 50 MCG PO TABS
50.0000 ug | ORAL_TABLET | Freq: Every day | ORAL | Status: DC
  Administered 2024-04-24: 50 ug
  Filled 2024-04-24: qty 1

## 2024-04-24 MED ORDER — FENTANYL 2500MCG IN NS 250ML (10MCG/ML) PREMIX INFUSION
INTRAVENOUS | Status: AC
  Filled 2024-04-24: qty 250

## 2024-04-24 MED ORDER — PANTOPRAZOLE SODIUM 40 MG IV SOLR
40.0000 mg | Freq: Every day | INTRAVENOUS | Status: DC
  Administered 2024-04-24: 40 mg via INTRAVENOUS
  Filled 2024-04-24: qty 10

## 2024-04-24 MED ORDER — SODIUM BICARBONATE 8.4 % IV SOLN
INTRAVENOUS | Status: DC
  Filled 2024-04-24 (×4): qty 1000

## 2024-04-24 MED ORDER — ADULT MULTIVITAMIN W/MINERALS CH
1.0000 | ORAL_TABLET | Freq: Every day | ORAL | Status: DC
  Administered 2024-04-24: 1
  Filled 2024-04-24: qty 1

## 2024-04-24 MED ORDER — SODIUM CHLORIDE 0.9 % IV BOLUS: 500.0000 mL | Freq: Once | INTRAVENOUS | Status: AC

## 2024-04-24 MED ORDER — FENTANYL CITRATE (PF) 50 MCG/ML IJ SOSY
25.0000 ug | PREFILLED_SYRINGE | Freq: Once | INTRAMUSCULAR | Status: AC
  Administered 2024-04-24: 50 ug via INTRAVENOUS
  Filled 2024-04-24: qty 1

## 2024-04-24 MED ORDER — SODIUM ZIRCONIUM CYCLOSILICATE 10 G PO PACK
10.0000 g | PACK | Freq: Once | ORAL | Status: AC
  Administered 2024-04-24: 10 g via ORAL
  Filled 2024-04-24: qty 1

## 2024-04-24 MED ORDER — INSULIN ASPART 100 UNIT/ML IV SOLN: 10.0000 [IU] | Freq: Once | INTRAVENOUS | Status: DC

## 2024-04-24 MED ORDER — SODIUM BICARBONATE 8.4 % IV SOLN: 50.0000 meq | Freq: Once | INTRAVENOUS | Status: DC

## 2024-04-24 MED ORDER — ENOXAPARIN SODIUM 100 MG/ML IJ SOSY
90.0000 mg | PREFILLED_SYRINGE | Freq: Two times a day (BID) | INTRAMUSCULAR | Status: DC
  Administered 2024-04-24: 90 mg via SUBCUTANEOUS
  Filled 2024-04-24: qty 0.9

## 2024-04-24 MED ORDER — CALCIUM CHLORIDE 10 % IV SOLN: 1.0000 g | Freq: Once | INTRAVENOUS | Status: DC

## 2024-04-24 MED ORDER — VALACYCLOVIR HCL 500 MG PO TABS
500.0000 mg | ORAL_TABLET | Freq: Every morning | ORAL | Status: DC
  Administered 2024-04-25: 500 mg
  Filled 2024-04-24: qty 1

## 2024-04-24 MED ORDER — ENOXAPARIN SODIUM 100 MG/ML IJ SOSY
90.0000 mg | PREFILLED_SYRINGE | INTRAMUSCULAR | Status: DC
  Administered 2024-04-25: 90 mg via SUBCUTANEOUS
  Filled 2024-04-24: qty 0.9

## 2024-04-24 MED ORDER — EPINEPHRINE HCL 5 MG/250ML IV SOLN IN NS
INTRAVENOUS | Status: AC
  Filled 2024-04-24: qty 250

## 2024-04-24 MED ORDER — ROCURONIUM BROMIDE 10 MG/ML (PF) SYRINGE
PREFILLED_SYRINGE | INTRAVENOUS | Status: AC
  Filled 2024-04-24: qty 10

## 2024-04-24 MED ORDER — DIGOXIN 0.25 MG/ML IJ SOLN
0.1250 mg | Freq: Once | INTRAMUSCULAR | Status: AC
  Administered 2024-04-24: 0.125 mg via INTRAVENOUS
  Filled 2024-04-24: qty 0.5

## 2024-04-24 MED ORDER — DOPAMINE-DEXTROSE 3.2-5 MG/ML-% IV SOLN
0.0000 ug/kg/min | INTRAVENOUS | Status: DC
  Administered 2024-04-24: 2 ug/kg/min via INTRAVENOUS

## 2024-04-24 MED ORDER — AZITHROMYCIN 500 MG PO TABS
500.0000 mg | ORAL_TABLET | Freq: Every day | ORAL | Status: DC
  Administered 2024-04-24: 500 mg
  Filled 2024-04-24 (×2): qty 1

## 2024-04-24 MED ORDER — FENTANYL BOLUS VIA INFUSION
25.0000 ug | INTRAVENOUS | Status: DC | PRN
  Administered 2024-04-25: 50 ug via INTRAVENOUS

## 2024-04-24 MED ORDER — IPRATROPIUM-ALBUTEROL 0.5-2.5 (3) MG/3ML IN SOLN
3.0000 mL | Freq: Four times a day (QID) | RESPIRATORY_TRACT | Status: DC
  Administered 2024-04-24 – 2024-04-25 (×5): 3 mL via RESPIRATORY_TRACT
  Filled 2024-04-24 (×5): qty 3

## 2024-04-24 MED ORDER — SODIUM CHLORIDE 0.9 % IV BOLUS
500.0000 mL | Freq: Once | INTRAVENOUS | Status: AC
  Administered 2024-04-24: 500 mL via INTRAVENOUS

## 2024-04-24 MED ORDER — LACTATED RINGERS IV SOLN: INTRAVENOUS | Status: DC

## 2024-04-24 MED ORDER — PERFLUTREN LIPID MICROSPHERE
1.0000 mL | INTRAVENOUS | Status: AC | PRN
  Administered 2024-04-24: 2 mL via INTRAVENOUS

## 2024-04-24 MED ORDER — SODIUM CHLORIDE 0.9 % IV SOLN
1.0000 g | Freq: Two times a day (BID) | INTRAVENOUS | Status: DC
  Administered 2024-04-24 – 2024-04-25 (×2): 1 g via INTRAVENOUS
  Filled 2024-04-24 (×3): qty 20

## 2024-04-24 MED ORDER — NOREPINEPHRINE 16 MG/250ML-% IV SOLN
0.0000 ug/min | INTRAVENOUS | Status: DC
  Administered 2024-04-24: 60 ug/min via INTRAVENOUS
  Administered 2024-04-24: 17 ug/min via INTRAVENOUS
  Administered 2024-04-24 (×2): 60 ug/min via INTRAVENOUS
  Administered 2024-04-24: 70 ug/min via INTRAVENOUS
  Administered 2024-04-25: 30 ug/min via INTRAVENOUS
  Administered 2024-04-25: 65 ug/min via INTRAVENOUS
  Filled 2024-04-24 (×4): qty 250
  Filled 2024-04-24: qty 500
  Filled 2024-04-24: qty 250

## 2024-04-24 MED ORDER — INSULIN ASPART 100 UNIT/ML IJ SOLN
INTRAMUSCULAR | Status: AC
  Filled 2024-04-24: qty 10

## 2024-04-24 MED ORDER — SODIUM CHLORIDE 0.9 % IV SOLN
1.0000 g | Freq: Three times a day (TID) | INTRAVENOUS | Status: DC
  Administered 2024-04-24: 1 g via INTRAVENOUS
  Filled 2024-04-24: qty 20

## 2024-04-24 MED ORDER — FENTANYL 2500MCG IN NS 250ML (10MCG/ML) PREMIX INFUSION
0.0000 ug/h | INTRAVENOUS | Status: DC
  Administered 2024-04-24: 50 ug/h via INTRAVENOUS

## 2024-04-24 MED ORDER — SUCCINYLCHOLINE CHLORIDE 200 MG/10ML IV SOSY
PREFILLED_SYRINGE | INTRAVENOUS | Status: AC
  Filled 2024-04-24: qty 10

## 2024-04-24 MED ORDER — SODIUM CHLORIDE 0.9 % IV BOLUS
1000.0000 mL | Freq: Once | INTRAVENOUS | Status: AC
  Administered 2024-04-24: 1000 mL via INTRAVENOUS

## 2024-04-24 MED ORDER — HYDROCORTISONE SOD SUC (PF) 100 MG IJ SOLR
100.0000 mg | Freq: Two times a day (BID) | INTRAMUSCULAR | Status: DC
  Administered 2024-04-24 (×2): 100 mg via INTRAVENOUS
  Filled 2024-04-24 (×2): qty 2

## 2024-04-24 MED ORDER — ROCURONIUM BROMIDE 10 MG/ML (PF) SYRINGE
PREFILLED_SYRINGE | INTRAVENOUS | Status: AC
  Administered 2024-04-24: 50 mg via INTRAVENOUS
  Filled 2024-04-24: qty 10

## 2024-04-24 MED ORDER — METHYLPREDNISOLONE SODIUM SUCC 125 MG IJ SOLR: 80.0000 mg | Freq: Every day | INTRAMUSCULAR | Status: DC

## 2024-04-24 MED ORDER — ETOMIDATE 2 MG/ML IV SOLN
INTRAVENOUS | Status: AC
  Filled 2024-04-24: qty 20

## 2024-04-24 MED ORDER — SODIUM BICARBONATE 8.4 % IV SOLN
Freq: Once | INTRAVENOUS | Status: AC
  Filled 2024-04-24: qty 150

## 2024-04-24 MED ORDER — AZITHROMYCIN 500 MG PO TABS
500.0000 mg | ORAL_TABLET | Freq: Every day | ORAL | Status: DC
  Filled 2024-04-24: qty 1

## 2024-04-24 MED ORDER — DEXTROSE 50 % IV SOLN: 1.0000 | Freq: Once | INTRAVENOUS | Status: DC

## 2024-04-24 MED ORDER — PROPOFOL 1000 MG/100ML IV EMUL
0.0000 ug/kg/min | INTRAVENOUS | Status: DC
  Administered 2024-04-24: 5 ug/kg/min via INTRAVENOUS

## 2024-04-24 MED ORDER — VASOPRESSIN 20 UNITS/100 ML INFUSION FOR SHOCK
0.0000 [IU]/min | INTRAVENOUS | Status: DC
  Administered 2024-04-24 – 2024-04-25 (×4): 0.03 [IU]/min via INTRAVENOUS
  Filled 2024-04-24 (×4): qty 100

## 2024-04-24 NOTE — Progress Notes (Signed)
 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  This baseline has been quite functional without requiring any oxygen and is followed in pulmonary clinic with Dr. [name].  September 2025 FVC 2.4 L or 65% and DLCO 12.7/55% and largely stable compared to 2023.  He also has grade 2 diastolic dysfunction.   Admitted 03/28/2024 through 04/05/2024 with parainfluenza virus 3 that based on chart review and CT review course worsening respiratory failure requiring oxygen at discharge and a prolonged prednisone  taper.  Echo ef 55% baselinebut GRADe 2 DDX According to the wife he recovered from this and came off oxygen at home.  He was undergoing home physical therapy and doing pretty well.  He came off his prednisone  taper on 04/19/2024.   Presented acutely to pulmonary clinic on 04/20/2024 1 day after coming off prednisone  with acute worsening respiratory distress and desaturations at home.  No fever or chills.  Consideration for prednisone  rebound was given and patient was restarted on prednisone  in the hospital but he is continue to deteriorate and ended up in the ER where he is on 80% FiO2 on BiPAP but compensating quite well and alert and oriented.  CT scan shows progressive bilateral lower lobe consolidation compared to  earlier in January 2026 and definitely worse compared to 1 month earlier.   Wife at bedside and critical care medicine called for consultation and Jolynn Pack, ER bed 32.  He has an advanced directive and wife said he is full code.  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  Significant Hospital Events: Including procedures, antibiotic start and stop dates in addition to other pertinent events   1/23 admitted 1/24 decomp resp status 1/24-25 night -- code x 2. MSOF. Severe acidosis  1/25 day code status updated to DNR   Interim History / Subjective:  Arrested x 2 overnight   Objective    Blood pressure (!) 77/38, pulse (!) 102, temperature (!) 95 F (35 C), temperature source Axillary, resp. rate (!) 30, height 5' 8 (1.727 m), weight 91.6 kg, SpO2 98%.    Vent Mode: PRVC FiO2 (%):  [80 %-100 %] 100 % Set Rate:  [15 bmp-20 bmp] 20 bmp Vt Set:  [550 mL] 550 mL PEEP:  [5 cmH20-10 cmH20] 5 cmH20   Intake/Output  Summary (Last 24 hours) at 04/24/2024 0749 Last data filed at 04/24/2024 0700 Gross per 24 hour  Intake 3244.35 ml  Output 1125 ml  Net 2119.35 ml   Filed Weights   04/22/24 0319  Weight: 91.6 kg    Examination: General: critically and chronically ill M HENT: ETT secure. Dried blood on his mouth and neck  Lungs: mechanically  ventilated  Cardiovascular: tachycardic, irregular  Abdomen: round soft  Extremities: No obvious acute joint deformity. R femoral CVC  Neuro: Sedated  GU: foley   Resolved problem list   Assessment and Plan   Cardiac arrest Shock  -Incr ceiling on pressors -- currently on epi 15 NE 60 vaso 0.03. + solucortef -MAP >65 is goal -if able to wean, come down on epi first  -echo pending  -if he were to stabilize, would repalce cvc to internal jugular and get a coox; right now he is so unstable that I dont think this information would have potential to change tx course   AoC resp failure w hypoxia and hypercarbia  ILD Hemoptysis Presumed HCAP  P -cont MV -PRN ABG cxr  -have d/w RN -- if vent dyssynchrony please let us  know so we can help troubleshoot -- may need mode change vs overnight NMB  -azithro mero -cx data pending  AKI Metabolic acidosis -some mixed acidosis but overwhelmingly this metabolically driven  HyperK, improving HypoCa P -bicarb gtt -add'l 2g cal glu -with his significant pressor requirement I do not think there is utility for nephro consult to consider crrt for acidosis mgmnt   Elevated LFTs // shock liver  -trend   Lactic acidosis  -trend  HFpEF  Afib on eliquis  -lovenox   -in light of trying to temporize acidosis, is rcving more volume than ideal for HF however is requisite to attempt stabilization at this point   Hypothyroidism  -synthroid    DNR  GOC discussion -see IPAL 1/25 -- DNR, cont current lines of tx. Wife understands critical status and grim prognosis, is hoping to facilitate family arrival & see if there is anything improvable in interim   Labs   CBC: Recent Labs  Lab 04/22/24 0335 04/23/24 0357 04/24/24 0243 04/24/24 0413 04/24/24 0730  WBC 9.2 6.6 7.5  --  PENDING  NEUTROABS 6.1  --   --   --   --   HGB 10.5* 9.5* 9.9* 10.2* 9.5*  HCT 32.4* 28.5* 32.3* 30.0* 31.4*  MCV 93.4 90.8 96.7  --  99.1  PLT 282 251 251  --  206     Basic Metabolic Panel: Recent Labs  Lab 04/22/24 0335 04/23/24 0357 04/24/24 0243 04/24/24 0413 04/24/24 0624  NA 135 136 143 140  --   K 4.2 4.3 7.4* 7.1* 4.8  CL 100 103 104  --   --   CO2 21* 20* 14*  --   --   GLUCOSE 139* 146* 113*  --   --   BUN 25* 27* 50*  --   --   CREATININE 0.98 0.95 1.90*  --   --   CALCIUM  8.8* 8.6* 7.5*  --   --   MG  --  2.3 2.9*  --   --   PHOS  --  3.6 9.9*  --   --    GFR: Estimated Creatinine Clearance: 33.5 mL/min (A) (by C-G formula based on SCr of 1.9 mg/dL (H)). Recent Labs  Lab 04/22/24 0335 04/22/24 0345 04/22/24 0510 04/23/24 0357 04/24/24 0243 04/24/24 0300 04/24/24 0730  PROCALCITON  --   --   --  1.28  --   --   --   WBC 9.2  --   --  6.6 7.5  --  PENDING  LATICACIDVEN  --  3.2* 1.8  --   --  >9.0*  --     Liver Function Tests: Recent Labs  Lab 04/22/24 0335 04/24/24 0243  AST 34 1,639*  ALT 36 1,260*  ALKPHOS 85 112  BILITOT 0.4 2.8*  PROT 7.4 6.1*  ALBUMIN 4.1 3.3*   No results for input(s): LIPASE, AMYLASE in the last 168 hours. No results for input(s): AMMONIA in the last 168 hours.  ABG    Component Value Date/Time   PHART 7.09 (LL) 04/24/2024 0510   PCO2ART 53 (H) 04/24/2024 0510   PO2ART 86 04/24/2024 0510   HCO3 16.1 (L) 04/24/2024 0510   TCO2 19 (L) 04/24/2024 0413   ACIDBASEDEF 13.2 (H) 04/24/2024 0510   O2SAT 95.6 04/24/2024 0510     Coagulation Profile: Recent Labs  Lab 04/22/24 0335  INR 1.3*    Cardiac Enzymes: No results for input(s): CKTOTAL, CKMB, CKMBINDEX, TROPONINI in the last 168 hours.  HbA1C: Hgb A1c MFr Bld  Date/Time Value Ref Range Status  04/22/2024 03:35 AM 6.1 (H) 4.8 - 5.6 % Final    Comment:    (NOTE) Diagnosis of Diabetes The following HbA1c ranges recommended by the American Diabetes Association (ADA) may be used as an aid in the diagnosis of diabetes mellitus.  Hemoglobin             Suggested A1C NGSP%              Diagnosis  <5.7                    Non Diabetic  5.7-6.4                Pre-Diabetic  >6.4                   Diabetic  <7.0                   Glycemic control for                       adults with diabetes.      CBG: Recent Labs  Lab 04/22/24 1421 04/23/24 1509 04/24/24 0339  GLUCAP 128* 138* 99      Critical care time: 60 min        CRITICAL CARE Performed by: Ronnald FORBES Gave   Total critical care time: 60 minutes  Critical care time was exclusive of separately billable procedures and treating other patients. Critical care was necessary to treat or prevent imminent or life-threatening deterioration.  Critical care was time spent personally by me on the following activities: development of treatment plan with patient and/or surrogate as well as nursing, discussions with consultants, evaluation of patient's response to treatment, examination of patient, obtaining history from patient or surrogate, ordering and performing treatments and interventions, ordering and review of laboratory studies, ordering and review of radiographic studies, pulse oximetry and re-evaluation of patient's condition.  Ronnald Gave MSN, AGACNP-BC [facility] Amion for pager  04/24/2024, 9:06 AM     "

## 2024-04-24 NOTE — Progress Notes (Signed)
 SPIRITUAL CARE AND COUNSELING CONSULT NOTE   VISIT SUMMARY Chaplain responded to code blue page. Emotional and spiritual support provided to wife [name] and daughter Leita over the phone, including prayer, reading from scripture, and active, compassionate presence.   SPIRITUAL ENCOUNTER                                                                                                                                                                      Type of Visit: Follow up Care provided to:: Pt and family Conversation partners present during encounter: Nurse, Physician Referral source: Code page Reason for visit: Code OnCall Visit: Yes  If immediate needs arise, please contact Jolynn Pack 24 hour on call [phone]   Donnice JINNY Shuck, Chaplain  04/24/2024 7:43 AM

## 2024-04-24 NOTE — Procedures (Addendum)
 Central Venous Catheter Insertion Procedure Note  Mordechai Matuszak  [id number]  [birth year]  Date:04/24/24  Time:2:32 AM   Provider Performing:Jamilett Ferrante CHRISTELLA Ply   Procedure: Insertion of Non-tunneled Central Venous Catheter(36556) with US  guidance (23062)   Indication(s) Medication administration and Difficult access  Consent Unable to obtain consent due to emergent nature of procedure.  Anesthesia Topical only with 1% lidocaine    Timeout Verified patient identification, verified procedure, site/side was marked, verified correct patient position, special equipment/implants available, medications/allergies/relevant history reviewed, required imaging and test results available.  Sterile Technique Maximal sterile technique including full sterile barrier drape, hand hygiene, sterile gown, sterile gloves, mask, hair covering, sterile ultrasound probe cover (if used).  Procedure Description Area of catheter insertion was cleaned with chlorhexidine  and draped in sterile fashion.  With real-time ultrasound guidance a central venous catheter was placed into the right femoral vein. Nonpulsatile blood flow and easy flushing noted in all ports.  The catheter was sutured in place and sterile dressing applied.  Complications/Tolerance None; patient tolerated the procedure well. Chest X-ray is ordered to verify placement for internal jugular or subclavian cannulation.   Chest x-ray is not ordered for femoral cannulation.  EBL Minimal  Specimen(s) None  Right internal jugular venous access failed due to intravascular depletion.

## 2024-04-24 NOTE — Progress Notes (Signed)
 Bite block placed with no issues.  Will continue to monitor.

## 2024-04-24 NOTE — Procedures (Signed)
 Intubation Procedure Note  Peter Greene  [id number]  [birth year]  Date:04/24/24  Time:2:35 AM   Provider Performing:Shakera Ebrahimi CHRISTELLA Ply    Procedure: Intubation (31500)  Indication(s) Respiratory Failure  Consent Unable to obtain consent due to emergent nature of procedure.   Anesthesia none   Time Out Verified patient identification, verified procedure, site/side was marked, verified correct patient position, special equipment/implants available, medications/allergies/relevant history reviewed, required imaging and test results available.   Sterile Technique Usual hand hygeine, masks, and gloves were used   Procedure Description Patient positioned in bed supine.  Sedation given as noted above.  Patient was intubated with endotracheal tube using Glidescope.  View was Grade 2 only posterior commissure .  Number of attempts was 1.  Colorimetric CO2 detector was consistent with tracheal placement.   Complications/Tolerance None; patient tolerated the procedure well. Chest X-ray is ordered to verify placement.   EBL Minimal   Specimen(s) None  ETT 7.5 @ 24 cm at the teeth

## 2024-04-24 NOTE — Progress Notes (Signed)
 eLink Physician-Brief Progress Note Patient Name: Peter Greene DOB: 1940/08/27 MRN: [id number]   Date of Service  04/24/2024  HPI/Events of Note  Pt in Afib RVR, HR 140s-160s.  Remains hypotensive, on levophed , vasopressin  and epinephrine . With multiorgan failure, lactate remains >9.   eICU Interventions  One time dose of digoxin  0.125mg  IV ordered. Will follow response.  Overall prognosis remains poor.  Pt DNR         Marlenne Ridge CHRISTELLA ORANGE CRUZ 04/24/2024, 8:59 PM

## 2024-04-24 NOTE — Progress Notes (Signed)
 PHARMACY NOTE:  ANTIMICROBIAL RENAL DOSAGE ADJUSTMENT  Current antimicrobial regimen includes a mismatch between antimicrobial dosage and estimated renal function.  As per policy approved by the Pharmacy & Therapeutics and Medical Executive Committees, the antimicrobial dosage will be adjusted accordingly.  Current antimicrobial dosage:  Merrem  1gm IV Q8H  Indication: PNA  Renal Function:  Estimated Creatinine Clearance: 27.9 mL/min (A) (by C-G formula based on SCr of 2.28 mg/dL (H)). []      On intermittent HD, scheduled: []      On CRRT    Antimicrobial dosage has been changed to:  Merrem  1g IV Q12H  Winola Drum D. Lendell, PharmD, BCPS, BCCCP 04/24/2024, 9:39 AM

## 2024-04-24 NOTE — Progress Notes (Addendum)
 eLink Physician-Brief Progress Note Patient Name: Peter Greene DOB: 05/01/1940 MRN: [id number]   Date of Service  04/24/2024  HPI/Events of Note  Notified of near code situation. Seen on camera. Was told that HR dropped to 30s and given atropine. MAP 70. Has a pulse. On BIPAP. Not responsive. I ordered dopamine  for temporary stabilization and CCM ground team at bedside.   eICU Interventions  As above      Intervention Category Major Interventions: Arrhythmia - evaluation and management;Respiratory failure - evaluation and management  Leslyn Monda G Jaquesha Boroff 04/24/2024, 1:17 AM  240 am - asked to order quad strength levo which is already hanging.   4 am - Asked to camera in for severe drop in bp. BP was in 60s. NS bolus was running, I asked levo be increased and changed the levo order to allow increase, also added Epi drip but before any of this could be done, HR also dropped to 40s. I ordered an amp of epi, and patient also lost a pulse a the same time. CPR was quickly started and ROSC obtained with one round oc chest compressions. Asked RN to send off labs along with an ABG. Prognosis seems poor at this time. CCM also at bedside.

## 2024-04-24 NOTE — Progress Notes (Signed)
 SPIRITUAL CARE AND COUNSELING CONSULT NOTE   VISIT SUMMARY Chaplain responded to Code Blue. Support provided to pt Peter Greene's wife, Peter Greene, as indicated below.   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 Together, they have two adult daughters and multiple grandchildren. Peter Greene loves to play golf, and they have enjoyed a wonderful life together for over 40 years.  Peter Greene is aware that chaplains continue to remain available as other support needs arise.  SPIRITUAL ENCOUNTER                                                                                                                                                                      Type of Visit: Initial Care provided to:: Family Conversation partners present during encounter: Nurse, Physician Referral source: Code page Reason for visit: Code OnCall Visit: Yes  SPIRITUAL FRAMEWORK  Presenting Themes: Meaning/purpose/sources of inspiration, Values and beliefs, Significant life change, Impactful experiences and emotions, Rituals and practive, Community and relationships   INTERVENTIONS   Spiritual Care Interventions Made: Established relationship of care and support, Compassionate presence, Reflective listening, Normalization of emotions, Explored values/beliefs/practices/strengths, Prayer, Supported grief process   INTERVENTION OUTCOMES   Outcomes: Connection to spiritual care, Awareness around self/spiritual resourses, Reduced anxiety, Reduced fear, Reduced isolation, Patient family open to resources  If immediate needs arise, please contact Peter Greene 24 hour on call [phone]   Peter Greene, Chaplain  04/24/2024 3:16 AM

## 2024-04-24 NOTE — Progress Notes (Signed)
 IV Team responded to Code Blue. Pt with 2 working IVs.

## 2024-04-24 NOTE — Code Documentation (Signed)
 Code blue: PEA for 2 minutes. One round of chest compressions. One Epi 1 mg. ROSC after 2 minutes--> Afib/RVR Stat ABG showed hypercapnia, hyperkalemia, hypoCa. Given Ca 1 gm x2 Bicarb x1 D50 x1 Insulin  10 units IV RR on the Vent was increased to 30  One dose of Rocuronium  50 mg for asynchrony with the vent  Stat labs are pending

## 2024-04-24 NOTE — Procedures (Signed)
 Arterial Catheter Insertion Procedure Note  Moksh Loomer  [id number]  11/09/1940  Date:04/24/24  Time:3:34 AM    Provider Performing: Warren DEL Sheena Simonis    Procedure: Insertion of Arterial Line (63379) with US  guidance (23062)   Indication(s) Blood pressure monitoring and/or need for frequent ABGs  Consent Risks of the procedure as well as the alternatives and risks of each were explained to the patient and/or caregiver.  Consent for the procedure was obtained and is signed in the bedside chart  Anesthesia None   Time Out Verified patient identification, verified procedure, site/side was marked, verified correct patient position, special equipment/implants available, medications/allergies/relevant history reviewed, required imaging and test results available.   Sterile Technique Maximal sterile technique including full sterile barrier drape, hand hygiene, sterile gown, sterile gloves, mask, hair covering, sterile ultrasound probe cover (if used).   Procedure Description Area of catheter insertion was cleaned with chlorhexidine  and draped in sterile fashion. With real-time ultrasound guidance an arterial catheter was placed into the right radial artery.  Appropriate arterial tracings confirmed on monitor.     Complications/Tolerance None; patient tolerated the procedure well.   EBL Minimal   Specimen(s) None  Warren Shade, DNP, AGACNP-BC [facility]  Please see [email or web address] for pager details.  From 7A-7P if no response, please call [phone]. After hours, please call ELink [phone].

## 2024-04-24 NOTE — Progress Notes (Signed)
 Echocardiogram 2D Echocardiogram has been performed.  Peter Greene 04/24/2024, 8:42 AM

## 2024-04-24 NOTE — Code Documentation (Addendum)
 He developed sinus bradycardia 35 sinus/slow Afib, given three doses of Atropine 1 mg, one bicarb ampoule, and dopamine  drip. He did not lose pulse. No chest compressions. Precedex  was 1.2 mcg/kg/hr, and received usual Metoprolol  dose midnight. Precedex  and Metoprolol  were d/c. Intubated due to AMS, hemodynamic instability, and hypoxia. He developed shock and Levophed  and Vasopressin  were added. Given 1 L NS 0.9% bolus. PRVC 20/550/+5/100%. ABG is pending   P/E: General: intubated.  HENT: PERL, dry bloody secretions seen during intubation.  No LNE or thyromegaly. No JVD Lungs: symmetrical air entry bilaterally with fine crackles. No wheezing Cardiovascular: NL S1/S2. No m/g/r Abdomen: no distension or tenderness Extremities: no edema. Symmetrical   Labs and images: noted PCXR: bilaterally infiltrates   Notes: reviewed  Echo: EF 55%, G2DD  Assessment: HCAP Septic shock Acute hypoxic resp failure ILD Anemia/MDS Chronic Afib Hypothyroidism GERD D-CHF Hemoptysis  Chronic pain  PLAN:  Medications: Changed Abx to Meropenem /Zithromax . D/c Vanco since MRSA is negative. Continue steroids Insulin  drip for glycemic control  Therapeutic Lovenox  for Afib Already on PPI D/c Flomax , Precedex , BB Duoneb Tracheal aspirate Cx U Ags F/u Bcx2 CRP LR I/O chart Trop LA Am labs ABG  D/w wife at the ICU in details  CCM 50 min

## 2024-04-24 NOTE — IPAL (Addendum)
" °  Interdisciplinary Goals of Care Family Meeting   Date carried out: 04/24/2024  Location of the meeting: Bedside  Member's involved: Nurse Practitioner, Bedside Registered Nurse, and Family Member or next of kin  Durable Power of Attorney or acting medical decision maker: wife    Discussion: We discussed goals of care for [name] .  Peter Greene arrested x 2 overnight and has remained critically ill -- currently on 3 pressors (NE 50 Epi 10 vaso 0.03) with SBP 80-90s on arterial line  We share concerns that his prognosis at this time looks very grim. We are awaiting repeat labs to see how things are this morning, but share concerns that he may have worsening multisystem organ failure.   There are two daughters who are hoping to come to the hospital, though one is from out of state and wife understands his heart may stop at any time.  We discussed course of action should his heart stop, discussing code statuses in detail. A decision was reached for DNR  We will cont current efforts in attempt to facilitate family arrival & await labs to see if there is anything improvable -- but should his heart stop we will not resuscitate.  If labs reveal worsening MSOF, we will again revisit goals of care as wife has stated very clearly that she does not want him to suffer.    Code status:   Code Status: Do not attempt resuscitation (DNR) PRE-ARREST INTERVENTIONS DESIRED   Disposition: Continue current acute care  Time spent for the meeting: CCT    Ronnald FORBES Gave, NP  04/24/2024, 7:38 AM   "

## 2024-04-25 DIAGNOSIS — I5030 Unspecified diastolic (congestive) heart failure: Secondary | ICD-10-CM | POA: Diagnosis not present

## 2024-04-25 DIAGNOSIS — J189 Pneumonia, unspecified organism: Secondary | ICD-10-CM | POA: Diagnosis not present

## 2024-04-25 DIAGNOSIS — E039 Hypothyroidism, unspecified: Secondary | ICD-10-CM | POA: Diagnosis not present

## 2024-04-25 DIAGNOSIS — Z85118 Personal history of other malignant neoplasm of bronchus and lung: Secondary | ICD-10-CM | POA: Diagnosis not present

## 2024-04-25 DIAGNOSIS — J9601 Acute respiratory failure with hypoxia: Secondary | ICD-10-CM | POA: Diagnosis not present

## 2024-04-25 DIAGNOSIS — I4891 Unspecified atrial fibrillation: Secondary | ICD-10-CM | POA: Diagnosis not present

## 2024-04-25 DIAGNOSIS — K219 Gastro-esophageal reflux disease without esophagitis: Secondary | ICD-10-CM | POA: Diagnosis not present

## 2024-04-25 DIAGNOSIS — D649 Anemia, unspecified: Secondary | ICD-10-CM | POA: Diagnosis not present

## 2024-04-25 DIAGNOSIS — Z8546 Personal history of malignant neoplasm of prostate: Secondary | ICD-10-CM | POA: Diagnosis not present

## 2024-04-25 DIAGNOSIS — G8929 Other chronic pain: Secondary | ICD-10-CM | POA: Diagnosis not present

## 2024-04-25 DIAGNOSIS — E785 Hyperlipidemia, unspecified: Secondary | ICD-10-CM | POA: Diagnosis not present

## 2024-04-25 DIAGNOSIS — I469 Cardiac arrest, cause unspecified: Secondary | ICD-10-CM | POA: Diagnosis not present

## 2024-04-25 LAB — CBC
HCT: 27 % — ABNORMAL LOW (ref 39.0–52.0)
Hemoglobin: 8.3 g/dL — ABNORMAL LOW (ref 13.0–17.0)
MCH: 29.6 pg (ref 26.0–34.0)
MCHC: 30.7 g/dL (ref 30.0–36.0)
MCV: 96.4 fL (ref 80.0–100.0)
Platelets: 114 10*3/uL — ABNORMAL LOW (ref 150–400)
RBC: 2.8 MIL/uL — ABNORMAL LOW (ref 4.22–5.81)
RDW: 18 % — ABNORMAL HIGH (ref 11.5–15.5)
WBC: 4.8 10*3/uL (ref 4.0–10.5)
nRBC: 23.6 % — ABNORMAL HIGH (ref 0.0–0.2)

## 2024-04-25 LAB — POCT I-STAT, CHEM 8
BUN: 27 mg/dL — ABNORMAL HIGH (ref 8–23)
Calcium, Ion: 1.14 mmol/L — ABNORMAL LOW (ref 1.15–1.40)
Chloride: 101 mmol/L (ref 98–111)
Creatinine, Ser: 1 mg/dL (ref 0.61–1.24)
Glucose, Bld: 138 mg/dL — ABNORMAL HIGH (ref 70–99)
HCT: 33 % — ABNORMAL LOW (ref 39.0–52.0)
Hemoglobin: 11.2 g/dL — ABNORMAL LOW (ref 13.0–17.0)
Potassium: 3.9 mmol/L (ref 3.5–5.1)
Sodium: 137 mmol/L (ref 135–145)
TCO2: 23 mmol/L (ref 22–32)

## 2024-04-25 LAB — POTASSIUM
Potassium: 4.4 mmol/L (ref 3.5–5.1)
Potassium: 4.6 mmol/L (ref 3.5–5.1)

## 2024-04-25 LAB — BASIC METABOLIC PANEL WITH GFR
Anion gap: 29 — ABNORMAL HIGH (ref 5–15)
BUN: 63 mg/dL — ABNORMAL HIGH (ref 8–23)
CO2: 15 mmol/L — ABNORMAL LOW (ref 22–32)
Calcium: 6.4 mg/dL — CL (ref 8.9–10.3)
Chloride: 97 mmol/L — ABNORMAL LOW (ref 98–111)
Creatinine, Ser: 4.49 mg/dL — ABNORMAL HIGH (ref 0.61–1.24)
GFR, Estimated: 12 mL/min — ABNORMAL LOW
Glucose, Bld: 291 mg/dL — ABNORMAL HIGH (ref 70–99)
Potassium: 4.4 mmol/L (ref 3.5–5.1)
Sodium: 141 mmol/L (ref 135–145)

## 2024-04-25 LAB — TRIGLYCERIDES: Triglycerides: 124 mg/dL

## 2024-04-25 LAB — RHEUMATOID FACTOR: Rheumatoid fact SerPl-aCnc: 97.4 [IU]/mL — ABNORMAL HIGH

## 2024-04-25 MED ORDER — FENTANYL 2500MCG IN NS 250ML (10MCG/ML) PREMIX INFUSION: 0.0000 ug/h | INTRAVENOUS | Status: DC

## 2024-04-25 MED ORDER — FENTANYL BOLUS VIA INFUSION
100.0000 ug | INTRAVENOUS | Status: DC | PRN
  Administered 2024-04-25 (×2): 100 ug via INTRAVENOUS

## 2024-04-25 MED ORDER — GLYCOPYRROLATE 0.2 MG/ML IJ SOLN: 0.2000 mg | INTRAMUSCULAR | Status: DC | PRN

## 2024-04-25 MED ORDER — GLYCOPYRROLATE 0.2 MG/ML IJ SOLN
0.2000 mg | INTRAMUSCULAR | Status: DC | PRN
  Administered 2024-04-25: 0.2 mg via INTRAVENOUS
  Filled 2024-04-25: qty 1

## 2024-04-25 MED ORDER — POLYVINYL ALCOHOL 1.4 % OP SOLN: 1.0000 [drp] | Freq: Four times a day (QID) | OPHTHALMIC | Status: DC | PRN

## 2024-04-25 MED ORDER — GLYCOPYRROLATE 1 MG PO TABS: 1.0000 mg | ORAL_TABLET | ORAL | Status: DC | PRN

## 2024-04-25 MED ORDER — ACETAMINOPHEN 650 MG RE SUPP: 650.0000 mg | Freq: Four times a day (QID) | RECTAL | Status: DC | PRN

## 2024-04-25 MED ORDER — LORAZEPAM 2 MG/ML IJ SOLN
2.0000 mg | INTRAMUSCULAR | Status: DC | PRN
  Administered 2024-04-25: 2 mg via INTRAVENOUS
  Filled 2024-04-25: qty 1

## 2024-04-25 MED ORDER — ACETAMINOPHEN 325 MG PO TABS: 650.0000 mg | ORAL_TABLET | Freq: Four times a day (QID) | ORAL | Status: DC | PRN

## 2024-04-26 LAB — CYCLIC CITRUL PEPTIDE ANTIBODY, IGG/IGA: CCP Antibodies IgG/IgA: 7 U (ref 0–19)

## 2024-04-26 LAB — LEGIONELLA PNEUMOPHILA SEROGP 1 UR AG: L. pneumophila Serogp 1 Ur Ag: NEGATIVE

## 2024-04-27 LAB — CULTURE, BLOOD (ROUTINE X 2)
Culture: NO GROWTH
Culture: NO GROWTH
Special Requests: ADEQUATE

## 2024-04-27 LAB — ANCA TITERS
Atypical P-ANCA titer: <1:20 {titer}
C-ANCA: <1:20 {titer}
P-ANCA: <1:20 {titer}

## 2024-05-01 NOTE — Progress Notes (Incomplete)
 Critical care attending attestation note:  Patient seen and examined and relevant ancillary tests reviewed.  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 [name] (9/25-FVC 2.4 L / 65%, DLCO 12.7/55%-stable compared to 2023), grade 2 diastolic dysfunction. 12/20 9/25 - 04/05/2024-admitted with parainfluenza virus CT worsening-requiring prolonged prednisone  taper on home oxygen.  Presented to the clinic 04/20/2024 after coming off prednisone  with acute worsening respiratory distress and desaturation at home.  This was thought to be rebound, and restarted on prednisone , but continued to worsen and showed up to the ER now requiring 80% FiO2 on BiPAP.   Overnight/AM of of 1/25 patient went into PEA cardiac arrest and 1 round of CPR, was intubated, and now in high dose of multi pressors. Patient then transitioned to DNR/DNI before going comfort care on 22-May-2024 after family members visiting.       Post PEA cardiac arrest X2 Acute hypoxemic respiratory failure-on mechanical ventilation CT showing worsening bilateral lower lobe consolidation-PE ruled out Hemoptysis-likely due to inflammation, being on Eliquis  HCAP HFpEF-grade 2 diastolic dysfunction 12/25 A-fib on Eliquis  and Lopressor -Eliquis  held for 2 days due to hemoptysis, therapeutic Lovenox  started a.m. of 1/25 Chronic pain on gabapentin  Hyperlipidemia on omega-3, Krill oil and Lipitor-Will hold this at this time History of prostate cancer-on Relugolix -not taking per wife Lactic acidosis-postcardiac arrest GERD-on home PPI Chronic anemia Hypothyroid-on  Synthroid 's History of laryngeal CA History of localized lung CA with empiric SBRT 2024     Plan -Patient transitioned to comfort care on May 22, 2024, and compassionately extubated at around 12:58 PM. -Patient passed peacefully with family members at bedside and in spiritual care offered. - Time of death at 1315 on 22-May-2024      CRITICAL CARE Lenny Drought, MD  [facility] Prefer epic messenger for cross cover needs   My critical care time: 30 minutes  Critical care time was exclusive of separately billable procedures and treating other patients.  Critical care was necessary to treat or prevent imminent or life-threatening deterioration.  Critical care was time spent personally by me on the following activities: development of treatment plan with patient and/or surrogate as well as nursing, discussions with consultants, evaluation of patient's response to treatment, examination of patient, obtaining history from patient or surrogate, ordering and performing treatments and interventions, ordering and review of laboratory studies, ordering and review of radiographic studies, pulse oximetry, re-evaluation of patient's condition and participation in multidisciplinary rounds.

## 2024-05-01 NOTE — Progress Notes (Signed)
 VENTILATOR WEAN NOTE 05/01/2024  Start Mode: PRVC  Wean Mode: Pressure Support  Duration before failure: 0 minutes  Reason for failure: Other: Vent settings FiO2 100%.  Notes: Patient not a wean candidate at this time. FiO2 100%.

## 2024-05-01 NOTE — Progress Notes (Incomplete)
 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  This baseline has been quite functional without requiring any oxygen and is followed in pulmonary clinic with Dr. [name].  September 2025 FVC 2.4 L or 65% and DLCO 12.7/55% and largely stable compared to 2023.  He also has grade 2 diastolic dysfunction.   Admitted 03/28/2024 through 04/05/2024 with parainfluenza virus 3 that based on chart review and CT review course worsening respiratory failure requiring oxygen at discharge and a prolonged prednisone  taper.  Echo ef 55% baselinebut GRADe 2 DDX According to the wife he recovered from this and came off oxygen at home.  He was undergoing home physical therapy and doing pretty well.  He came off his prednisone  taper on 04/19/2024.   Presented acutely to pulmonary clinic on 04/20/2024 1 day after coming off prednisone  with acute worsening respiratory distress and desaturations at home.  No fever or chills.  Consideration for prednisone  rebound was given and patient was restarted on prednisone  in the hospital but he is continue to deteriorate and ended up in the ER where he is on 80% FiO2 on BiPAP but compensating quite well and alert and oriented.  CT scan shows progressive bilateral lower lobe consolidation compared to  earlier in January 2026 and definitely worse compared to 1 month earlier.   Wife at bedside and critical care medicine called for consultation and Jolynn Pack, ER bed 32.  He has an advanced directive and wife said he is full code.  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  Significant Hospital Events: Including procedures, antibiotic start and stop dates in addition to other pertinent events   1/23 admitted 1/24 decomp resp status 1/24-25 night -- code x 2. MSOF. Severe acidosis  1/25 day code status updated to DNR   Interim History / Subjective:  Arrested x 2 overnight   Objective    Blood pressure 119/63, pulse (!) 124, temperature 98.6 F (37 C), temperature source Axillary, resp. rate (!) 30, height 5' 7.99 (1.727 m), weight 96.2 kg, SpO2 (!) 69%.    Vent Mode: PRVC FiO2 (%):  [100 %] 100 % Set Rate:  [20 bmp-30 bmp] 20 bmp Vt Set:  [500 mL-550 mL] 500 mL PEEP:  [5 cmH20] 5 cmH20 Plateau Pressure:  [25 cmH20-34  cmH20] 32 cmH20   Intake/Output Summary (Last 24 hours) at 21-May-2024 0911 Last data filed at 2024/05/21 0700 Gross per 24 hour  Intake 5606.92 ml  Output 75 ml  Net 5531.92 ml   Filed Weights   04/22/24 0319 21-May-2024 0328  Weight: 91.6 kg 96.2 kg    Examination: No sedation General: AoC ill elderly male in  NAD HEENT: MM pink/moist, ETT/ OGT Neuro: will move UE with exam, ? If following commands, otherwise has remained off sedation CV: rr, ST 130s, unable to palpate peripheral pulses PULM:  MV supported, coarse, no secretions, +cough, remains on FiO2 1.0 GI: soft, no BS appreciated, foley, scant output Extremities: cool/dry    Resolved problem list   Assessment and Plan   Cardiac arrest Shock  -Incr ceiling on pressors -- currently on epi 15 NE 60 vaso 0.03. + solucortef -MAP >65 is goal -if able to wean, come down on epi first  -echo pending  -if he were to stabilize, would repalce cvc to internal jugular and get a coox; right now he is so unstable that I dont think this information would have potential to change tx course   AoC resp failure w hypoxia and hypercarbia  ILD Hemoptysis Presumed HCAP  P -cont MV -PRN ABG cxr  -have d/w RN -- if vent dyssynchrony please let us  know so we can help troubleshoot -- may need mode change vs overnight NMB  -azithro mero -cx data pending  AKI Metabolic acidosis -some mixed acidosis but overwhelmingly this metabolically driven  HyperK, improving HypoCa P -bicarb gtt -add'l 2g cal glu -with his significant pressor requirement I do not think there is utility for nephro consult to consider crrt for acidosis mgmnt   Elevated LFTs // shock liver  -trend   Lactic acidosis  -trend  HFpEF  Afib on eliquis  -lovenox   -in light of trying to temporize acidosis, is rcving more volume than ideal for HF however is requisite to attempt stabilization at this point   Hypothyroidism  -synthroid    DNR  GOC discussion -see IPAL 1/25 -- DNR, cont current lines of tx. Wife understands critical status and grim prognosis, is hoping to facilitate family arrival & see if there is anything improvable in interim   Labs   CBC: Recent Labs  Lab 04/22/24 0335 04/22/24 0345 04/23/24 0357 04/24/24 0243 04/24/24 0413 04/24/24 0730 May 21, 2024 0330   WBC 9.2  --  6.6 7.5  --  3.8* 4.8  NEUTROABS 6.1  --   --   --   --   --   --   HGB 10.5*   < > 9.5* 9.9* 10.2* 9.5* 8.3*  HCT 32.4*   < > 28.5* 32.3* 30.0* 31.4* 27.0*  MCV 93.4  --  90.8 96.7  --  99.1 96.4  PLT 282  --  251 251  --  206 114*   < > = values in this interval not displayed.    Basic Metabolic Panel: Recent Labs  Lab 04/23/24 0357 04/24/24 0243 04/24/24 0413 04/24/24 0624 04/24/24 0730 04/24/24 1039 04/24/24 1840 05-21-24 0330 May 21, 2024 0510 2024-05-21 0826  NA 136 143 140  --  147* 148*  --  141  --   --   K 4.3 7.4* 7.1*   < > 4.9 4.8 4.6 4.4 4.4 4.6  CL 103 104  --   --  106 105  --  97*  --   --   CO2 20* 14*  --   --  17* 12*  --  15*  --   --   GLUCOSE 146* 113*  --   --  150* 169*  --  291*  --   --   BUN 27* 50*  --   --  49* 51*  --  63*  --   --   CREATININE 0.95 1.90*  --   --  2.28* 2.49*  --  4.49*  --   --   CALCIUM  8.6* 7.5*  --   --  8.8* 8.6*  --  6.4*  --   --   MG 2.3 2.9*  --   --   --   --   --   --   --   --   PHOS 3.6 9.9*  --   --   --   --   --   --   --   --    < > = values in this interval not displayed.   GFR: Estimated Creatinine Clearance: 14.5 mL/min (A) (by C-G formula based on SCr of 4.49 mg/dL (H)). Recent Labs  Lab 04/23/24 0357 04/24/24 0243 04/24/24 0300 04/24/24 0730 04/24/24 1039 04/24/24 1840 05/11/2024 0330  PROCALCITON 1.28  --   --   --   --   --   --   WBC 6.6 7.5  --  3.8*  --   --  4.8  LATICACIDVEN  --   --  >9.0* >9.0* >9.0* >9.0*  --     Liver Function Tests: Recent Labs  Lab 04/22/24 0335 04/24/24 0243 04/24/24 1039  AST 34 1,639* >7,000*  ALT 36 1,260* >7,000*  ALKPHOS 85 112 149*  BILITOT 0.4 2.8* 4.0*  PROT 7.4 6.1* 5.5*  ALBUMIN 4.1 3.3* 2.9*   No results for input(s): LIPASE, AMYLASE in the last 168 hours. No results for input(s): AMMONIA in the last 168 hours.  ABG    Component Value Date/Time   PHART 7.09 (LL) 04/24/2024 0510   PCO2ART 53 (H) 04/24/2024 0510   PO2ART 86  04/24/2024 0510   HCO3 16.1 (L) 04/24/2024 0510   TCO2 19 (L) 04/24/2024 0413   ACIDBASEDEF 13.2 (H) 04/24/2024 0510   O2SAT 95.6 04/24/2024 0510     Coagulation Profile: Recent Labs  Lab 04/22/24 0335  INR 1.3*    Cardiac Enzymes: No results for input(s): CKTOTAL, CKMB, CKMBINDEX, TROPONINI in the last 168 hours.  HbA1C: Hgb A1c MFr Bld  Date/Time Value Ref Range Status  04/22/2024 03:35 AM 6.1 (H) 4.8 - 5.6 % Final    Comment:    (NOTE) Diagnosis of Diabetes The following HbA1c ranges recommended by the American Diabetes Association (ADA) may be used as an aid in the diagnosis of diabetes mellitus.  Hemoglobin             Suggested A1C NGSP%              Diagnosis  <5.7                   Non Diabetic  5.7-6.4                Pre-Diabetic  >6.4                   Diabetic  <7.0                   Glycemic control for  adults with diabetes.      CBG: Recent Labs  Lab 04/22/24 1421 04/23/24 1509 04/24/24 0339 04/24/24 0839 04/24/24 1232  GLUCAP 128* 138* 99 99 140*      Critical care time: 60 min     CRITICAL CARE Performed by: Lyle Pesa   Total critical care time: *** minutes  Critical care time was exclusive of separately billable procedures and treating other patients.  Critical care was necessary to treat or prevent imminent or life-threatening deterioration.  Critical care was time spent personally by me on the following activities: development of treatment plan with patient and/or surrogate as well as nursing, discussions with consultants, evaluation of patient's response to treatment, examination of patient, obtaining history from patient or surrogate, ordering and performing treatments and interventions, ordering and review of laboratory studies, ordering and review of radiographic studies, pulse oximetry and re-evaluation of patient's condition.    Lyle Pesa, NP [facility] 2024/05/04, 9:11 AM  See Amion for pager If no response to pager , please call [phone] until 7pm After 7:00 pm call Elink  [phone]      "

## 2024-05-01 NOTE — TOC Progression Note (Signed)
 Transition of Care [facility]) - Progression Note    Patient Details  Name: Peter Greene MRN: [id number] Date of Birth: 1940/05/15  Transition of Care [facility]) CM/SW Contact  Tom-Johnson, Landynn Dupler Daphne, RN Phone Number: 05/10/2024, 10:56 AM  Clinical Narrative:     Patient transitioned to Comfort care at this time. Plan is for hospital death.   CM will continue to follow and render compassionate support at this time.                    Expected Discharge Plan and Services                                               Social Drivers of Health (SDOH) Interventions SDOH Screenings   Food Insecurity: No Food Insecurity (04/22/2024)  Housing: Low Risk (04/22/2024)  Transportation Needs: No Transportation Needs (04/22/2024)  Utilities: Not At Risk (04/22/2024)  Alcohol  Screen: Low Risk (02/05/2024)  Depression (PHQ2-9): Low Risk (02/05/2024)  Social Connections: Socially Integrated (04/22/2024)  Tobacco Use: Medium Risk (04/22/2024)    Readmission Risk Interventions    01/31/2022   10:29 AM  Readmission Risk Prevention Plan  Transportation Screening Complete  Home Care Screening Complete  Medication Review (RN CM) Complete

## 2024-05-01 NOTE — Progress Notes (Signed)
 SPIRITUAL CARE AND COUNSELING CONSULT NOTE   VISIT SUMMARY Chaplain Roxanne and Chaplain Jerry attempted a visit with Peter Greene, his wife and family for prayer at family's request. Family pastor also arrived simultaneously and wife elected to have pastor pray and care for them as Peter Greene was extubated. Chaplain provided nurse with patient placement card for wife so she has a contact number once family leaves hospital. Chaplain services offered to medical team. Available as needed.   SPIRITUAL ENCOUNTER                                                                                                                                                                      Type of Visit: Follow up Care provided to:: Family, Patient Referral source: Nurse (RN/NT/LPN) Reason for visit: End-of-life OnCall Visit: No   INTERVENTIONS   Spiritual Care Interventions Made: Compassionate presence, Supported grief process    INTERVENTION OUTCOMES   Outcomes: Connection to spiritual care, Awareness around self/spiritual resourses   If immediate needs arise, please contact [place] 24 hour on call [phone]   Roxanne Slade  May 15, 2024 1:04 PM

## 2024-05-01 NOTE — Progress Notes (Signed)
 SPIRITUAL CARE AND COUNSELING CONSULT NOTE   VISIT SUMMARY Chaplain offered educational materials [employer] for [employer]. Peter Greene (patient)'s wife Peter Greene said she actually didn't need the materials. Asked to pray for her and her husband before her daughters arrive. Asking for Chaplain to return when daughters arrive and before [name]'s tubes are removed.   SPIRITUAL ENCOUNTER                                                                                                                                                                      Type of Visit: Initial Care provided to:: Family, Patient Referral source: Nurse (RN/NT/LPN) Reason for visit: End-of-life OnCall Visit: No   INTERVENTIONS   Spiritual Care Interventions Made: Compassionate presence, Prayer    INTERVENTION OUTCOMES   Outcomes: Connection to spiritual care, Awareness around self/spiritual resourses   If immediate needs arise, please contact [place] 24 hour on call [phone]   Roxanne Slade  05/18/24 11:19 AM

## 2024-05-01 NOTE — Plan of Care (Signed)

## 2024-05-01 NOTE — Progress Notes (Signed)
 Patient extubated to room air per comfort care order. RN and family at the bedside.

## 2024-05-01 NOTE — Death Summary Note (Signed)
 "    DEATH SUMMARY   Patient Details  Name: Peter Greene MRN: [id number] DOB: 27-May-1940 ERE:Jmnwdnw, Charlie, MD  Admission/Discharge Information   Admit Date:  Apr 23, 2024  Date of Death: Date of Death: 04/26/2024  Time of Death: Time of Death: 1315  Length of Stay: 3   Principle Cause of death:   PEA cardiac arrest X2 Acute hypoxemic respiratory failure  Hospital Diagnoses: Principal Problem:   Interstitial lung disease (HCC) Active Problems:   Acute hypoxemic respiratory failure [facility])   Hospital Course: 84 year old male history of lung CA s/p SBRT-2024, ongoing prostate CA, previous laryngeal CA, A-fib, 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 [name] (9/25-FVC 2.4 L / 65%, DLCO 12.7/55%-stable compared to 2023), grade 2 diastolic dysfunction. 12/20 9/25 - 04/05/2024-admitted with parainfluenza virus CT worsening-requiring prolonged prednisone  taper on home oxygen.  Presented to the clinic 04/20/2024 after coming off prednisone  with acute worsening respiratory distress and desaturation at home.  This was thought to be rebound, and restarted on prednisone , but continued to worsen and showed up to the ER now requiring 80% FiO2 on BiPAP.   Overnight/AM of of 1/25 patient went into PEA cardiac arrest and 1 round of CPR, was intubated, and now in high dose of multi pressors. Patient then transitioned to DNR/DNI before going comfort care on 04-26-2024 after family members visiting. Patient transitioned to comfort care on 04/26/2024, and compassionately extubated at around 12:58 PM. -Patient passed peacefully with family members at bedside and in spiritual care offered. Time of death at 1315 on 04-26-2024   Assessment and Plan: Post PEA cardiac arrest X2 Acute hypoxemic respiratory failure-on mechanical  ventilation CT showing worsening bilateral lower lobe consolidation-PE ruled out Hemoptysis-likely due to inflammation, being on Eliquis  HCAP HFpEF-grade 2 diastolic dysfunction 12/25 A-fib on Eliquis  and Lopressor -Eliquis  held for 2 days due to hemoptysis, therapeutic Lovenox  started a.m. of 1/25 Chronic pain on gabapentin  Hyperlipidemia on omega-3, Krill oil and Lipitor-Will hold this at this time History of prostate cancer-on Relugolix -not taking per wife Lactic acidosis-postcardiac arrest GERD-on home PPI Chronic anemia Hypothyroid-on Synthroid 's History of laryngeal CA History of localized lung CA with empiric SBRT 2024          The results of significant diagnostics from this hospitalization (including imaging, microbiology, ancillary and laboratory) are listed below for reference.   Significant Diagnostic Studies: ECHOCARDIOGRAM COMPLETE Result Date: 04/24/2024    ECHOCARDIOGRAM REPORT   Patient Name:   Peter Greene Date of Exam: 04/24/2024 Medical Rec #:  [id number]       Height:       68.0 in Accession #:    [id number]      Weight:       201.9 lb Date of Birth:  Jan 23, 1941      BSA:          2.052 m Patient Age:    83 years        BP:           0/0 mmHg Patient Gender: M               HR:           119 bpm. Exam Location:  Inpatient Procedure: 2D Echo, Cardiac Doppler, Color Doppler and Intracardiac            Opacification Agent (Both Spectral and Color Flow Doppler were  utilized during procedure). Indications:    Cardiac Arrest I46.9  History:        Patient has prior history of Echocardiogram examinations, most                 recent 03/29/2024. Risk Factors:Hypertension, Former Smoker and                 Dyslipidemia.  Sonographer:    Merlynn Argyle Referring Phys: JAMIE H DAGENHART  Sonographer Comments: Technically difficult study due to poor echo windows and echo performed with patient supine and on artificial respirator. Image acquisition challenging due to  respiratory motion. IMPRESSIONS  1. Left ventricular ejection fraction, by estimation, is 65 to 70%. The left ventricle has normal function. Indeterminate diastolic filling due to E-A fusion. There is the interventricular septum is flattened in systole and diastole, consistent with right ventricular pressure and volume overload.  2. Right ventricular systolic function is moderately reduced. The right ventricular size is severely enlarged. There is moderately elevated pulmonary artery systolic pressure. The estimated right ventricular systolic pressure is 53.9 mmHg.  3. Right atrial size was moderately dilated.  4. The aortic valve is calcified. Aortic valve regurgitation is not visualized. Aortic valve sclerosis/calcification is present, without any evidence of aortic stenosis.  5. Aortic dilatation noted. There is mild dilatation of the ascending aorta, measuring 43 mm.  6. The inferior vena cava is dilated in size with <50% respiratory variability, suggesting right atrial pressure of 15 mmHg. Comparison(s): Changes from prior study are noted. RV better visualized during this evaluation compared to prior, but does appear more dilated with significant RV dysfunction and strain. FINDINGS  Left Ventricle: Left ventricular ejection fraction, by estimation, is 65 to 70%. The left ventricle has normal function. Definity  contrast agent was given IV to delineate the left ventricular endocardial borders. The left ventricular internal cavity size was normal in size. The interventricular septum is flattened in systole and diastole, consistent with right ventricular pressure and volume overload. Indeterminate diastolic filling due to E-A fusion. Right Ventricle: The right ventricular size is severely enlarged. No increase in right ventricular wall thickness. Right ventricular systolic function is moderately reduced. There is moderately elevated pulmonary artery systolic pressure. The tricuspid regurgitant velocity is 3.12 m/s,  and with an assumed right atrial pressure of 15 mmHg, the estimated right ventricular systolic pressure is 53.9 mmHg. Left Atrium: Left atrial size was normal in size. Right Atrium: Right atrial size was moderately dilated. Pericardium: There is no evidence of pericardial effusion. Tricuspid Valve: The tricuspid valve is normal in structure. Tricuspid valve regurgitation is not demonstrated. No evidence of tricuspid stenosis. Aortic Valve: The aortic valve is calcified. Aortic valve regurgitation is not visualized. Aortic valve sclerosis/calcification is present, without any evidence of aortic stenosis. Pulmonic Valve: The pulmonic valve was not well visualized. Pulmonic valve regurgitation is mild. Aorta: Aortic dilatation noted. There is mild dilatation of the ascending aorta, measuring 43 mm. Venous: The inferior vena cava is dilated in size with less than 50% respiratory variability, suggesting right atrial pressure of 15 mmHg.  LEFT VENTRICLE PLAX 2D LVOT diam:     2.10 cm LV SV:         55 LV SV Index:   27 LVOT Area:     3.46 cm  RIGHT VENTRICLE             IVC RV Basal diam:  5.00 cm     IVC diam: 2.10 cm RV Mid diam:  3.70 cm RV S prime:     10.00 cm/s TAPSE (M-mode): 1.1 cm LEFT ATRIUM             Index        RIGHT ATRIUM           Index LA diam:        3.30 cm 1.61 cm/m   RA Area:     24.30 cm LA Vol (A2C):   51.5 ml 25.10 ml/m  RA Volume:   75.80 ml  36.94 ml/m LA Vol (A4C):   35.1 ml 17.10 ml/m LA Biplane Vol: 44.0 ml 21.44 ml/m  AORTIC VALVE LVOT Vmax:   144.00 cm/s LVOT Vmean:  82.400 cm/s LVOT VTI:    0.158 m  AORTA Ao Root diam: 3.70 cm Ao Asc diam:  4.30 cm TRICUSPID VALVE TR Peak grad:   38.9 mmHg TR Vmax:        312.00 cm/s  SHUNTS Systemic VTI:  0.16 m Systemic Diam: 2.10 cm Joelle Cedars Tonleu Electronically signed by Joelle Cedars Tonleu Signature Date/Time: 04/24/2024/10:42:32 AM    Final    DG Abd 1 View Result Date: 04/24/2024 EXAM: 1 VIEW XRAY OF THE ABDOMEN 04/24/2024  05:03:40 AM COMPARISON: Comparison 04/24/2024 03:44 AM and an earlier study. CLINICAL HISTORY: 84 year old male with nasogastric tube present. FINDINGS: LINES, TUBES AND DEVICES: Enteric tube has been advanced into the left upper quadrant, side hole now projects at the level of the proximal gastric body. BOWEL: Stable bowel gas pattern. SOFT TISSUES: Partially visible abdominal aortic endograft. No abnormal calcifications. BONES: No acute fracture. LUNG BASES: Stable lung base opacity. IMPRESSION: 1. Satisfactory enteric tube placement in the stomach. Electronically signed by: Helayne Hurst MD 04/24/2024 05:09 AM EST RP Workstation: [id number]   DG Abd 1 View Result Date: 04/24/2024 EXAM: 1 VIEW XRAY OF THE ABDOMEN 04/24/2024 03:50:45 AM COMPARISON: Comparison abdominal radiograph 04/24/2024 02:41 AM. CLINICAL HISTORY: 84 year old male. Nasogastric tube present, intubated, enteric tube placed. FINDINGS: LINES, TUBES AND DEVICES: Enteric tube tip projects slightly lower just below the level of the medial diaphragm. The side hole is not easily identified now, but appears to be in the distal esophagus. Recommend advancing 6 cm to ensure tube placement in the stomach. Defibrillator pads overlying the left upper quadrant. Aorto bi-iliac stent graft partially visible. BOWEL: Nonobstructive bowel gas pattern. SOFT TISSUES: No abnormal calcifications. BONES: No acute fracture. LUNG BASES: Visualized lung bases with stable opacities. IMPRESSION: 1. Enteric tube side port in distal esophagus; advance 6 cm. 2. Non-obstructed bowel gas pattern. 3. Stable opacities at the lung bases. Electronically signed by: Helayne Hurst MD 04/24/2024 04:09 AM EST RP Workstation: [id number]   DG Abd Portable 1V Result Date: 04/24/2024 EXAM: 1 VIEW XRAY OF THE ABDOMEN 04/24/2024 02:51:23 AM COMPARISON: None available. CLINICAL HISTORY: History of endotracheal tube. Encounter for orogastric tube placement. ICD10: [id number] History of endotracheal  tube; [id number] Encounter for orogastric tube placement. FINDINGS: LINES, TUBES AND DEVICES: Enteric tube in place with tip overlying the expected region of the distal esophagus, just above the gastroesophageal junction. Advancement of the catheter by 10 - 15 cm would more optimally position the device. Aorto bi-iliac stent graft noted. Cardiac paddles overlying the left upper abdomen. BOWEL: Nonobstructive bowel gas pattern. SOFT TISSUES: No abnormal calcifications. BONES: No acute fracture. LUNG BASES: Visualized lung bases with interstitial airspace opacities. IMPRESSION: 1. Enteric tube tip overlies the distal esophagus just above the gastroesophageal junction. Advancement of the catheter by 10-15 cm would more  optimally position the device. 2. Visualized lung bases with interstitial airspace opacities. Electronically signed by: Dorethia Molt MD 04/24/2024 03:08 AM EST RP Workstation: [id number]   DG CHEST PORT 1 VIEW Result Date: 04/24/2024 EXAM: 1 VIEW(S) XRAY OF THE CHEST 04/24/2024 02:49:47 AM COMPARISON: 04/23/2024 CLINICAL HISTORY: History of endotracheal tube. FINDINGS: LINES, TUBES AND DEVICES: Endotracheal tube in place with tip 3.6 cm above the carina. Nasogastric tube tip seen within the distal esophagus. Advancement of the nasogastric tube by 15 - 20 cm is recommended for more optimal positioning. Left chest cardiac defibrillator pads noted. LUNGS AND PLEURA: Low lung volumes. Diffuse interstitial and airspace opacities are again identified and are relatively stable. Redemonstration of right perihilar masslike opacity with associated fiducial markers. No pleural effusion. No pneumothorax. HEART AND MEDIASTINUM: No acute abnormality of the cardiac and mediastinal silhouettes. BONES AND SOFT TISSUES: No acute osseous abnormality. IMPRESSION: 1. Endotracheal tube tip 3.6 cm above the carina. 2. Nasogastric tube tip in the distal esophagus; recommend advancement by 15-20 cm. 3. Stable diffuse  interstitial and airspace opacities and redemonstrated right perihilar masslike opacity with associated fiducial markers. Electronically signed by: Dorethia Molt MD 04/24/2024 03:06 AM EST RP Workstation: [id number]   DG CHEST PORT 1 VIEW Result Date: 04/23/2024 CLINICAL DATA:  Respiratory failure. EXAM: PORTABLE CHEST 1 VIEW COMPARISON:  04/22/2024 FINDINGS: The cardio pericardial silhouette is enlarged. Diffuse interstitial and airspace disease again noted, left greater than right. Right parahilar scarring evident with fiducial marker visible. No substantial pleural effusion. Telemetry leads overlie the chest. IMPRESSION: No substantial interval change. Diffuse interstitial and airspace disease, left greater than right. Electronically Signed   By: Camellia Candle M.D.   On: 04/23/2024 09:38   CT Angio Chest PE W and/or Wo Contrast Result Date: 04/22/2024 EXAM: CTA of the Chest with contrast for PE 04/22/2024 06:13:48 AM TECHNIQUE: CTA of the chest was performed after the administration of intravenous contrast. Multiplanar reformatted images are provided for review. MIP images are provided for review. Automated exposure control, iterative reconstruction, and/or weight based adjustment of the mA/kV was utilized to reduce the radiation dose to as low as reasonably achievable. COMPARISON: CTA chest 04/02/2024. Restaging CT in August last year. CLINICAL HISTORY: 84 year old male with hemoptysis, acute shortness of breath, atrial fibrillation on blood thinner, recent pneumonia, and treated small cell lung cancer. FINDINGS: PULMONARY ARTERIES: Pulmonary arteries are adequately opacified for evaluation. No pulmonary embolism. Main pulmonary artery is normal in caliber. MEDIASTINUM: Little to no contrast in the aorta. Stable aortic atherosclerosis and tortuosity. Calcified coronary artery atherosclerosis. Heart size remains normal. The pericardium demonstrates no acute abnormality. LYMPH NODES: No mediastinal, hilar or  axillary lymphadenopathy. LUNGS AND PLEURA: Elevation of the right hemidiaphragm but lower lung volumes compared to baseline. Likely abnormal right hilum and upper lobe suggesting previously biopsied and radiated lung lesion, stable appearance including regional bronchiectasis and radiation pneumonitis changes compared to restaging CT in August last year. Since that time, extensive bilateral combined subsolid and early consolidative lung opacity in all lobes. Bilateral lower lobes opacification has progressed from the CTA earlier this month. Confluent left upper lobe opacities not significantly changed. Small layering right pleural effusion stable from last month. No pneumothorax. Differential considerations include aggressive bilateral pneumonia, drug reaction or toxicity, and unlikely to be asymmetric edema. UPPER ABDOMEN: Mildly nodular liver contour in the upper abdomen. Otherwise stable and negative visible non-contrast upper abdominal viscera. SOFT TISSUES AND BONES: No acute bone or soft tissue abnormality. IMPRESSION: 1. No  pulmonary embolism. 2. Extensive bilateral lung opacities, subsolid and early consolidative, and progressed in both lower lobes since 04/02/2024. Top differential considerations include progression bilateral pneumonia, drug reaction or toxicity, other non-infectious inflammation. Stable small layering right pleural effusion, lung changes felt unlikely to be pulmonary edema. 3. Superimposed Chronic post-treatment changes of right hilar lung cancer. 4. Aortic Atherosclerosis (ICD10-I70.0). 5. Questionable visible liver contour nodularity, cirrhosis. Electronically signed by: Helayne Hurst MD 04/22/2024 06:57 AM EST RP Workstation: [id number]   DG Chest 2 View Result Date: 04/22/2024 EXAM: 2 VIEW(S) XRAY OF THE CHEST 04/20/2024 12:09:08 PM COMPARISON: CT chest dated 04/02/2024. CLINICAL HISTORY: Acute respiratory failure. FINDINGS: LINES, TUBES AND DEVICES: Partially visualized aortic  stent. LUNGS AND PLEURA: Redemonstrated right perihilar posttreatment changes. Multifocal patchy opacities, suspicious for pneumonia when correlating with prior CT. Trace right pleural effusion. No pneumothorax. HEART AND MEDIASTINUM: No acute abnormality of the cardiac and mediastinal silhouettes. BONES AND SOFT TISSUES: No acute osseous abnormality. IMPRESSION: 1. Multifocal patchy opacities, suspicious for pneumonia. 2. Trace right pleural effusion. Electronically signed by: Pinkie Pebbles MD 04/22/2024 03:56 AM EST RP Workstation: [id number]   DG Chest Port 1 View Result Date: 04/22/2024 EXAM: 1 VIEW XRAY OF THE CHEST 04/22/2024 03:46:02 AM COMPARISON: CT chest dated 04/02/2024. CLINICAL HISTORY: Questionable sepsis. Evaluate for abnormality. FINDINGS: LUNGS AND PLEURA: Low lung volumes. Progressive diffuse bilateral airspace opacities, left greater than right, suspicious for pneumonia. Small bilateral pleural effusions. Platelike radiation changes in right upper lobe with fiducial marker, residual tumor not excluded. No pneumothorax. HEART AND MEDIASTINUM: No acute abnormality of the cardiac and mediastinal silhouettes. BONES AND SOFT TISSUES: No acute osseous abnormality. IMPRESSION: 1. Progressive multifocal pneumonia. Small bilateral pleural effusions. 2. Platelike right upper lobe radiation changes. Residual tumor is not excluded. Electronically signed by: Pinkie Pebbles MD 04/22/2024 03:54 AM EST RP Workstation: [id number]   CT Angio Chest Pulmonary Embolism (PE) W or WO Contrast Result Date: 04/02/2024 CLINICAL DATA:  Hypoxia EXAM: CT ANGIOGRAPHY CHEST WITH CONTRAST TECHNIQUE: Multidetector CT imaging of the chest was performed using the standard protocol during bolus administration of intravenous contrast. Multiplanar CT image reconstructions and MIPs were obtained to evaluate the vascular anatomy. RADIATION DOSE REDUCTION: This exam was performed according to the departmental dose-optimization  program which includes automated exposure control, adjustment of the mA and/or kV according to patient size and/or use of iterative reconstruction technique. CONTRAST:  75mL OMNIPAQUE  IOHEXOL  350 MG/ML SOLN COMPARISON:  Four days ago FINDINGS: Cardiovascular: Satisfactory opacification of the pulmonary arteries to the segmental level. No evidence of pulmonary embolism. Normal heart size. No pericardial effusion. Mild coronary artery calcifications are noted. Mediastinum/Nodes: No enlarged mediastinal, hilar, or axillary lymph nodes. Thyroid gland, trachea, and esophagus demonstrate no significant findings. Lungs/Pleura: No pneumothorax is noted. Minimal right pleural effusion is noted. Significantly increased patchy airspace opacities are noted throughout both lungs most consistent with multifocal pneumonia or possibly edema. 3.6 x 1.6 cm right perihilar opacity is again noted which is not significantly changed concerning for possible recurrent malignancy as noted on prior exam. Upper Abdomen: No acute abnormality. Musculoskeletal: No chest wall abnormality. No acute or significant osseous findings. Review of the MIP images confirms the above findings. IMPRESSION: 1. No definite evidence of pulmonary embolus. 2. Significantly increased patchy airspace opacities are noted throughout both lungs most consistent with multifocal pneumonia or possibly edema. 3. 3.6 x 1.6 cm right perihilar opacity is again noted which is not significantly changed concerning for possible recurrent malignancy as noted on prior exam.  4. Mild coronary artery calcifications are noted. Electronically Signed   By: Lynwood Landy Raddle M.D.   On: 04/02/2024 11:59   DG Chest Port 1 View Result Date: 03/31/2024 CLINICAL DATA:  Shortness of breath. EXAM: PORTABLE CHEST 1 VIEW COMPARISON:  Chest CT dated 03/28/2024. FINDINGS: There is diffuse chronic antral coarsening and right perihilar fibrotic changes. Faint areas of increased densities primarily in  the left upper subpleural region appear new since the prior radiograph and may represent atypical infiltrate. No pleural effusion or pneumothorax. Stable cardiac silhouette. No acute osseous pathology. IMPRESSION: 1. Chronic interstitial coarsening and right perihilar fibrotic changes. 2. Possible atypical infiltrate in the left upper lobe. Electronically Signed   By: Vanetta Chou M.D.   On: 03/31/2024 13:19   ECHOCARDIOGRAM COMPLETE Result Date: 03/29/2024    ECHOCARDIOGRAM REPORT   Patient Name:   Peter Greene Date of Exam: 03/29/2024 Medical Rec #:  [id number]       Height:       68.0 in Accession #:    [id number]      Weight:       202.8 lb Date of Birth:  20-Dec-1940      BSA:          2.056 m Patient Age:    83 years        BP:           128/76 mmHg Patient Gender: M               HR:           68 bpm. Exam Location:  Inpatient Procedure: 2D Echo, Cardiac Doppler, Color Doppler and Intracardiac            Opacification Agent (Both Spectral and Color Flow Doppler were            utilized during procedure). Indications:     CHF Acute Diastolic I50.31  History:         Patient has prior history of Echocardiogram examinations, most                  recent 08/12/2021. Arrythmias:Atrial Fibrillation; Risk                  Factors:Hypertension.  Sonographer:     Tinnie Gosling RDCS Referring Phys:  [id number] LAVADA POUR [facility] Diagnosing Phys: Ezra Kanner IMPRESSIONS  1. Left ventricular ejection fraction, by estimation, is 55 to 60%. The left ventricle has normal function. The left ventricle has no regional wall motion abnormalities. There is mild concentric left ventricular hypertrophy. Left ventricular diastolic parameters are consistent with Grade II diastolic dysfunction (pseudonormalization).  2. Right ventricular systolic function is normal. The right ventricular size is normal.  3. Left atrial size was moderately dilated.  4. The mitral valve is normal in structure. No evidence of mitral valve  regurgitation. No evidence of mitral stenosis.  5. The aortic valve was not well visualized. There is moderate calcification of the aortic valve. Aortic valve regurgitation is not visualized. No aortic stenosis is present.  6. Aortic dilatation noted. There is borderline dilatation of the aortic root, measuring 38 mm.  7. The inferior vena cava is normal in size with greater than 50% respiratory variability, suggesting right atrial pressure of 3 mmHg.  8. Technically difficult study with poor acoustic windows. FINDINGS  Left Ventricle: Left ventricular ejection fraction, by estimation, is 55 to 60%. The left ventricle has normal function. The left ventricle has no regional wall  motion abnormalities. The left ventricular internal cavity size was normal in size. There is  mild concentric left ventricular hypertrophy. Left ventricular diastolic parameters are consistent with Grade II diastolic dysfunction (pseudonormalization). Right Ventricle: The right ventricular size is normal. No increase in right ventricular wall thickness. Right ventricular systolic function is normal. Left Atrium: Left atrial size was moderately dilated. Right Atrium: Right atrial size was normal in size. Pericardium: There is no evidence of pericardial effusion. Mitral Valve: The mitral valve is normal in structure. No evidence of mitral valve regurgitation. No evidence of mitral valve stenosis. Tricuspid Valve: The tricuspid valve is not well visualized. Tricuspid valve regurgitation is not demonstrated. Aortic Valve: The aortic valve was not well visualized. There is moderate calcification of the aortic valve. Aortic valve regurgitation is not visualized. No aortic stenosis is present. Aortic valve mean gradient measures 3.0 mmHg. Aortic valve peak gradient measures 5.5 mmHg. Aortic valve area, by VTI measures 1.60 cm. Pulmonic Valve: The pulmonic valve was normal in structure. Pulmonic valve regurgitation is not visualized. Aorta: Aortic  dilatation noted. There is borderline dilatation of the aortic root, measuring 38 mm. Venous: The inferior vena cava is normal in size with greater than 50% respiratory variability, suggesting right atrial pressure of 3 mmHg. IAS/Shunts: No atrial level shunt detected by color flow Doppler.  LEFT VENTRICLE PLAX 2D LVIDd:         4.70 cm   Diastology LVIDs:         3.50 cm   LV e' medial:    5.77 cm/s LV PW:         1.00 cm   LV E/e' medial:  10.2 LV IVS:        1.10 cm   LV e' lateral:   8.59 cm/s LVOT diam:     2.10 cm   LV E/e' lateral: 6.9 LV SV:         41 LV SV Index:   20 LVOT Area:     3.46 cm  RIGHT VENTRICLE RV S prime:     12.70 cm/s  PULMONARY VEINS TAPSE (M-mode): 2.0 cm      Diastolic Velocity: 24.00 cm/s                             S/D Velocity:       0.70                             Systolic Velocity:  16.60 cm/s LEFT ATRIUM           Index        RIGHT ATRIUM           Index LA diam:      4.30 cm 2.09 cm/m   RA Area:     15.40 cm LA Vol (A4C): 85.7 ml 41.68 ml/m  RA Volume:   34.10 ml  16.59 ml/m  AORTIC VALVE AV Area (Vmax):    1.70 cm AV Area (Vmean):   1.51 cm AV Area (VTI):     1.60 cm AV Vmax:           117.00 cm/s AV Vmean:          82.100 cm/s AV VTI:            0.254 m AV Peak Grad:      5.5 mmHg AV Mean Grad:  3.0 mmHg LVOT Vmax:         57.40 cm/s LVOT Vmean:        35.800 cm/s LVOT VTI:          0.117 m LVOT/AV VTI ratio: 0.46  AORTA Ao Root diam: 3.80 cm Ao Asc diam:  3.50 cm MITRAL VALVE MV Area (PHT): 3.40 cm    SHUNTS MV Decel Time: 223 msec    Systemic VTI:  0.12 m MV E velocity: 59.10 cm/s  Systemic Diam: 2.10 cm MV A velocity: 54.80 cm/s MV E/A ratio:  1.08 Dalton McleanMD Electronically signed by Ezra Kanner Signature Date/Time: 03/29/2024/4:06:23 PM    Final (Updated)    CT Angio Chest Pulmonary Embolism (PE) W or WO Contrast Result Date: 03/28/2024 EXAM: CTA CHEST 03/28/2024 04:50:00 PM TECHNIQUE: CTA of the chest was performed after the administration of  intravenous contrast. Multiplanar reformatted images are provided for review. MIP images are provided for review. Automated exposure control, iterative reconstruction, and/or weight based adjustment of the mA/kV was utilized to reduce the radiation dose to as low as reasonably achievable. 65 mL (iohexol  (OMNIPAQUE ) 350 MG/ML injection 75 mL IOHEXOL  350 MG/ML SOLN). COMPARISON: PET CT 02/02/2024. CLINICAL HISTORY: Pulmonary embolism (PE) suspected, low to intermediate prob, neg D-dimer. FINDINGS: PULMONARY ARTERIES: Pulmonary arteries are adequately opacified for evaluation. No acute pulmonary embolus. Main pulmonary artery is normal in caliber. 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 Moderate atherosclerotic plaque. The pericardium demonstrates no acute abnormality. The thoracic aorta is normal in caliber. LYMPH NODES: No mediastinal, hilar or axillary lymphadenopathy. LUNGS AND PLEURA: Persistent similar bilateral patchy reticulations with superimposed ground-glass airspace opacities. Interval increase in size of approximately 6 cm right perihilar peribronchovascular consolidation but extends to the right lower and right upper lobes. Associated metallic density centrally measuring 4 mm. Trace right pleural effusion. No pneumothorax. UPPER ABDOMEN: A fluid-density lesion of the superior right renal pole likely represents a simple renal cyst. Simple renal cysts do not require additional follow-up unless clinically indicated due to signs/symptoms. SOFT TISSUES AND BONES: No acute bone or soft tissue abnormality. IMPRESSION: 1. No pulmonary embolism. 2. Interval increase of an approximately 6 cm right perihilar peribronchovascular consolidation/mass extending into the right upper and right lower lobes, with associated central 4 mm metallic density. 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 3. Persistent bilateral patchy reticulations with superimposed  ground-glass opacities. 4. Persistent trace right pleural effusion. 5. Aortic valve leaflet calcification and at least 3 vessel coronary artery calcifications. Electronically signed by: Morgane Naveau MD 03/28/2024 07:00 PM EST RP Workstation: [id number]   DG Chest Port 1 View Result Date: 03/28/2024 EXAM: 1 VIEW(S) XRAY OF THE CHEST 03/28/2024 01:49:27 PM COMPARISON: 06/19/2022 CLINICAL HISTORY: shortness of breath FINDINGS: LUNGS AND PLEURA: Right hemithorax volume loss. Diffuse interstitial prominence which may reflect mild edema versus atypical infection. Asymmetric elevation of right hemidiaphragm. Right upper lung interstitial opacities, likely posttreatment changes. No pleural effusion. No pneumothorax. HEART AND MEDIASTINUM: No acute abnormality of the cardiac and mediastinal silhouettes. BONES AND SOFT TISSUES: No acute osseous abnormality. IMPRESSION: 1. Right hemithorax volume loss and asymmetric elevation of right hemidiaphragm. Likely reflecting post-treatment changes. 2. Diffuse interstitial prominence, possibly representing mild edema or atypical infection. Electronically signed by: Waddell Calk MD 03/28/2024 02:49 PM EST RP Workstation: [id number]    Microbiology: Recent Results (from the past 240 hours)  Blood Culture (routine x 2)     Status: None (Preliminary result)   Collection Time: 04/22/24  4:31 AM   Specimen: BLOOD LEFT ARM  Result Value Ref Range Status   Specimen Description BLOOD LEFT ARM  Final   Special Requests   Final    BOTTLES DRAWN AEROBIC ONLY Blood Culture results may not be optimal due to an inadequate volume of blood received in culture bottles   Culture   Final    NO GROWTH 4 DAYS Performed at [facility], [address] [place], [place] [address]    Report Status PENDING  Incomplete  Blood Culture (routine x 2)     Status: None (Preliminary result)   Collection Time: 04/22/24  5:08 AM   Specimen: BLOOD  Result Value Ref Range Status    Specimen Description BLOOD BLOOD LEFT ARM  Final   Special Requests   Final    BOTTLES DRAWN AEROBIC AND ANAEROBIC Blood Culture adequate volume   Culture   Final    NO GROWTH 4 DAYS Performed at [facility], [address], [place], [place] [address]    Report Status PENDING  Incomplete  Respiratory (~20 pathogens) panel by PCR     Status: None   Collection Time: 04/22/24  2:30 PM   Specimen: Nasopharyngeal Swab; Respiratory  Result Value Ref Range Status   Adenovirus NOT DETECTED NOT DETECTED Final   Coronavirus 229E NOT DETECTED NOT DETECTED Final    Comment: (NOTE) The Coronavirus on the Respiratory Panel, DOES NOT test for the novel  Coronavirus (2019 nCoV)    Coronavirus HKU1 NOT DETECTED NOT DETECTED Final   Coronavirus NL63 NOT DETECTED NOT DETECTED Final   Coronavirus OC43 NOT DETECTED NOT DETECTED Final   Metapneumovirus NOT DETECTED NOT DETECTED Final   Rhinovirus / Enterovirus NOT DETECTED NOT DETECTED Final   Influenza A NOT DETECTED NOT DETECTED Final   Influenza B NOT DETECTED NOT DETECTED Final   Parainfluenza Virus 1 NOT DETECTED NOT DETECTED Final   Parainfluenza Virus 2 NOT DETECTED NOT DETECTED Final   Parainfluenza Virus 3 NOT DETECTED NOT DETECTED Final   Parainfluenza Virus 4 NOT DETECTED NOT DETECTED Final   Respiratory Syncytial Virus NOT DETECTED NOT DETECTED Final   Bordetella pertussis NOT DETECTED NOT DETECTED Final   Bordetella Parapertussis NOT DETECTED NOT DETECTED Final   Chlamydophila pneumoniae NOT DETECTED NOT DETECTED Final   Mycoplasma pneumoniae NOT DETECTED NOT DETECTED Final    Comment: Performed at [facility], [address], [place], [place] [address]  MRSA Next Gen by PCR, Nasal     Status: None   Collection Time: 04/22/24  2:30 PM   Specimen: Nasopharyngeal Swab; Nasal Swab  Result Value Ref Range Status   MRSA by PCR Next Gen NOT DETECTED NOT DETECTED Final    Comment: (NOTE) The GeneXpert MRSA Assay (FDA approved  for NASAL specimens only), is one component of a comprehensive MRSA colonization surveillance program. It is not intended to diagnose MRSA infection nor to guide or monitor treatment for MRSA infections. Test performance is not FDA approved in patients less than 39 years old. Performed at [facility], [address], [place], [place] [address]     Time spent: 20 minutes  Signed: Lenny Drought, MD 2024-05-18   "

## 2024-05-01 DEATH — deceased

## 2024-05-06 ENCOUNTER — Ambulatory Visit (HOSPITAL_COMMUNITY): Admit: 2024-05-06 | Admitting: Urology

## 2024-05-09 ENCOUNTER — Ambulatory Visit (HOSPITAL_COMMUNITY)

## 2024-05-10 ENCOUNTER — Ambulatory Visit: Admitting: Radiation Oncology

## 2024-06-01 ENCOUNTER — Other Ambulatory Visit (HOSPITAL_BASED_OUTPATIENT_CLINIC_OR_DEPARTMENT_OTHER)

## 2024-06-13 ENCOUNTER — Ambulatory Visit: Admitting: Radiation Oncology

## 2024-07-20 ENCOUNTER — Ambulatory Visit: Admitting: Pulmonary Disease
